# Patient Record
Sex: Female | Born: 1945 | Race: Black or African American | Hispanic: No | Smoking: Never smoker
Health system: Southern US, Community
[De-identification: ages and names within clinical notes are randomized; demographics above are authoritative.]

## PROBLEM LIST (undated history)

## (undated) DIAGNOSIS — N840 Polyp of corpus uteri: Secondary | ICD-10-CM

## (undated) DIAGNOSIS — Z860100 Personal history of colon polyps, unspecified: Secondary | ICD-10-CM

## (undated) DIAGNOSIS — R519 Headache, unspecified: Secondary | ICD-10-CM

## (undated) DIAGNOSIS — M1990 Unspecified osteoarthritis, unspecified site: Secondary | ICD-10-CM

## (undated) DIAGNOSIS — I10 Essential (primary) hypertension: Secondary | ICD-10-CM

## (undated) DIAGNOSIS — H269 Unspecified cataract: Secondary | ICD-10-CM

## (undated) DIAGNOSIS — R51 Headache: Secondary | ICD-10-CM

## (undated) DIAGNOSIS — K219 Gastro-esophageal reflux disease without esophagitis: Secondary | ICD-10-CM

## (undated) DIAGNOSIS — K5790 Diverticulosis of intestine, part unspecified, without perforation or abscess without bleeding: Secondary | ICD-10-CM

## (undated) DIAGNOSIS — K449 Diaphragmatic hernia without obstruction or gangrene: Secondary | ICD-10-CM

## (undated) DIAGNOSIS — E0590 Thyrotoxicosis, unspecified without thyrotoxic crisis or storm: Secondary | ICD-10-CM

## (undated) DIAGNOSIS — D649 Anemia, unspecified: Secondary | ICD-10-CM

## (undated) DIAGNOSIS — H409 Unspecified glaucoma: Secondary | ICD-10-CM

## (undated) DIAGNOSIS — I2510 Atherosclerotic heart disease of native coronary artery without angina pectoris: Secondary | ICD-10-CM

## (undated) HISTORY — DX: Headache, unspecified: R51.9

## (undated) HISTORY — DX: Anemia, unspecified: D64.9

## (undated) HISTORY — DX: Unspecified glaucoma: H40.9

## (undated) HISTORY — DX: Polyp of corpus uteri: N84.0

## (undated) HISTORY — DX: Headache: R51

## (undated) HISTORY — DX: Gastro-esophageal reflux disease without esophagitis: K21.9

## (undated) HISTORY — DX: Thyrotoxicosis, unspecified without thyrotoxic crisis or storm: E05.90

## (undated) HISTORY — PX: UPPER GASTROINTESTINAL ENDOSCOPY: SHX188

## (undated) HISTORY — DX: Diverticulosis of intestine, part unspecified, without perforation or abscess without bleeding: K57.90

## (undated) HISTORY — DX: Unspecified cataract: H26.9

## (undated) HISTORY — PX: COLONOSCOPY: SHX174

## (undated) HISTORY — DX: Unspecified osteoarthritis, unspecified site: M19.90

## (undated) HISTORY — DX: Personal history of colon polyps, unspecified: Z86.0100

## (undated) HISTORY — DX: Diaphragmatic hernia without obstruction or gangrene: K44.9

## (undated) HISTORY — DX: Personal history of colonic polyps: Z86.010

## (undated) HISTORY — DX: Atherosclerotic heart disease of native coronary artery without angina pectoris: I25.10

---

## 1997-08-03 ENCOUNTER — Encounter: Payer: Self-pay | Admitting: Internal Medicine

## 1998-06-12 ENCOUNTER — Other Ambulatory Visit: Admission: RE | Admit: 1998-06-12 | Discharge: 1998-06-12 | Payer: Self-pay | Admitting: Obstetrics and Gynecology

## 1998-10-25 ENCOUNTER — Emergency Department (HOSPITAL_COMMUNITY): Admission: EM | Admit: 1998-10-25 | Discharge: 1998-10-25 | Payer: Self-pay | Admitting: Emergency Medicine

## 1998-10-31 ENCOUNTER — Encounter: Payer: Self-pay | Admitting: Internal Medicine

## 1999-11-16 ENCOUNTER — Other Ambulatory Visit: Admission: RE | Admit: 1999-11-16 | Discharge: 1999-11-16 | Payer: Self-pay | Admitting: Obstetrics and Gynecology

## 1999-11-23 ENCOUNTER — Ambulatory Visit (HOSPITAL_COMMUNITY): Admission: RE | Admit: 1999-11-23 | Discharge: 1999-11-23 | Payer: Self-pay | Admitting: Obstetrics and Gynecology

## 2001-03-12 ENCOUNTER — Inpatient Hospital Stay (HOSPITAL_COMMUNITY): Admission: EM | Admit: 2001-03-12 | Discharge: 2001-03-17 | Payer: Self-pay | Admitting: Emergency Medicine

## 2001-03-12 ENCOUNTER — Encounter: Payer: Self-pay | Admitting: Emergency Medicine

## 2001-12-16 ENCOUNTER — Encounter: Payer: Self-pay | Admitting: Internal Medicine

## 2001-12-16 ENCOUNTER — Ambulatory Visit (HOSPITAL_COMMUNITY): Admission: RE | Admit: 2001-12-16 | Discharge: 2001-12-16 | Payer: Self-pay | Admitting: Internal Medicine

## 2002-03-03 ENCOUNTER — Other Ambulatory Visit: Admission: RE | Admit: 2002-03-03 | Discharge: 2002-03-03 | Payer: Self-pay | Admitting: Obstetrics and Gynecology

## 2003-05-24 ENCOUNTER — Other Ambulatory Visit: Admission: RE | Admit: 2003-05-24 | Discharge: 2003-05-24 | Payer: Self-pay | Admitting: Obstetrics and Gynecology

## 2003-12-09 ENCOUNTER — Emergency Department (HOSPITAL_COMMUNITY): Admission: EM | Admit: 2003-12-09 | Discharge: 2003-12-09 | Payer: Self-pay | Admitting: Emergency Medicine

## 2003-12-21 ENCOUNTER — Encounter: Admission: RE | Admit: 2003-12-21 | Discharge: 2003-12-21 | Payer: Self-pay | Admitting: Internal Medicine

## 2005-01-21 ENCOUNTER — Ambulatory Visit: Payer: Self-pay | Admitting: Internal Medicine

## 2005-01-30 ENCOUNTER — Ambulatory Visit: Payer: Self-pay | Admitting: Internal Medicine

## 2005-08-12 ENCOUNTER — Ambulatory Visit: Payer: Self-pay | Admitting: Internal Medicine

## 2005-12-11 ENCOUNTER — Other Ambulatory Visit: Admission: RE | Admit: 2005-12-11 | Discharge: 2005-12-11 | Payer: Self-pay | Admitting: Obstetrics and Gynecology

## 2006-06-03 ENCOUNTER — Ambulatory Visit: Payer: Self-pay | Admitting: Internal Medicine

## 2006-06-17 ENCOUNTER — Ambulatory Visit: Payer: Self-pay | Admitting: Internal Medicine

## 2006-06-17 ENCOUNTER — Ambulatory Visit (HOSPITAL_COMMUNITY): Admission: RE | Admit: 2006-06-17 | Discharge: 2006-06-17 | Payer: Self-pay | Admitting: Internal Medicine

## 2006-07-31 ENCOUNTER — Ambulatory Visit: Payer: Self-pay | Admitting: Internal Medicine

## 2006-12-03 ENCOUNTER — Ambulatory Visit: Payer: Self-pay | Admitting: Internal Medicine

## 2006-12-03 LAB — CONVERTED CEMR LAB
Bilirubin Urine: NEGATIVE
CO2: 33 meq/L — ABNORMAL HIGH (ref 19–32)
Creatinine, Ser: 0.7 mg/dL (ref 0.4–1.2)
Potassium: 3.7 meq/L (ref 3.5–5.1)
Sodium: 142 meq/L (ref 135–145)
Urine Glucose: NEGATIVE mg/dL

## 2007-03-27 ENCOUNTER — Ambulatory Visit: Payer: Self-pay | Admitting: Internal Medicine

## 2007-03-27 LAB — CONVERTED CEMR LAB
AST: 24 units/L (ref 0–37)
Albumin: 3.7 g/dL (ref 3.5–5.2)
Amylase: 112 units/L (ref 27–131)
Basophils Absolute: 0 10*3/uL (ref 0.0–0.1)
Bilirubin, Direct: 0.1 mg/dL (ref 0.0–0.3)
Creatinine, Ser: 0.7 mg/dL (ref 0.4–1.2)
Eosinophils Relative: 4.7 % (ref 0.0–5.0)
Glucose, Bld: 99 mg/dL (ref 70–99)
H Pylori IgG: POSITIVE — AB
HCT: 39.5 % (ref 36.0–46.0)
Hemoglobin, Urine: NEGATIVE
Hemoglobin: 13.3 g/dL (ref 12.0–15.0)
Leukocytes, UA: NEGATIVE
MCHC: 33.7 g/dL (ref 30.0–36.0)
MCV: 97.2 fL (ref 78.0–100.0)
Monocytes Absolute: 0.3 10*3/uL (ref 0.2–0.7)
Neutrophils Relative %: 40.8 % — ABNORMAL LOW (ref 43.0–77.0)
Potassium: 3.7 meq/L (ref 3.5–5.1)
RDW: 11.6 % (ref 11.5–14.6)
Sodium: 141 meq/L (ref 135–145)
Total Bilirubin: 0.7 mg/dL (ref 0.3–1.2)
Total Protein: 7.5 g/dL (ref 6.0–8.3)
Urobilinogen, UA: 0.2 (ref 0.0–1.0)
WBC: 3.2 10*3/uL — ABNORMAL LOW (ref 4.5–10.5)
pH: 8 (ref 5.0–8.0)

## 2007-04-09 ENCOUNTER — Ambulatory Visit: Payer: Self-pay | Admitting: Gastroenterology

## 2007-04-09 ENCOUNTER — Ambulatory Visit: Payer: Self-pay | Admitting: Internal Medicine

## 2007-06-16 ENCOUNTER — Ambulatory Visit: Payer: Self-pay | Admitting: Internal Medicine

## 2007-06-29 ENCOUNTER — Ambulatory Visit: Payer: Self-pay

## 2007-09-01 ENCOUNTER — Telehealth: Payer: Self-pay | Admitting: Internal Medicine

## 2007-10-08 ENCOUNTER — Encounter: Payer: Self-pay | Admitting: Internal Medicine

## 2007-11-17 ENCOUNTER — Telehealth: Payer: Self-pay | Admitting: Internal Medicine

## 2007-11-18 ENCOUNTER — Ambulatory Visit: Payer: Self-pay | Admitting: Internal Medicine

## 2007-11-18 DIAGNOSIS — R109 Unspecified abdominal pain: Secondary | ICD-10-CM | POA: Insufficient documentation

## 2007-11-18 DIAGNOSIS — M1990 Unspecified osteoarthritis, unspecified site: Secondary | ICD-10-CM | POA: Insufficient documentation

## 2007-11-18 DIAGNOSIS — R209 Unspecified disturbances of skin sensation: Secondary | ICD-10-CM

## 2007-11-18 DIAGNOSIS — K5730 Diverticulosis of large intestine without perforation or abscess without bleeding: Secondary | ICD-10-CM | POA: Insufficient documentation

## 2007-11-18 DIAGNOSIS — I10 Essential (primary) hypertension: Secondary | ICD-10-CM | POA: Insufficient documentation

## 2007-11-18 DIAGNOSIS — K219 Gastro-esophageal reflux disease without esophagitis: Secondary | ICD-10-CM

## 2007-11-18 DIAGNOSIS — I2510 Atherosclerotic heart disease of native coronary artery without angina pectoris: Secondary | ICD-10-CM

## 2007-11-19 ENCOUNTER — Ambulatory Visit: Payer: Self-pay | Admitting: Internal Medicine

## 2007-11-20 ENCOUNTER — Ambulatory Visit: Payer: Self-pay | Admitting: Cardiology

## 2007-11-21 ENCOUNTER — Encounter: Payer: Self-pay | Admitting: Internal Medicine

## 2007-11-21 DIAGNOSIS — E559 Vitamin D deficiency, unspecified: Secondary | ICD-10-CM

## 2007-11-21 DIAGNOSIS — R930 Abnormal findings on diagnostic imaging of skull and head, not elsewhere classified: Secondary | ICD-10-CM

## 2007-11-24 LAB — CONVERTED CEMR LAB
AST: 23 units/L (ref 0–37)
Albumin: 3.9 g/dL (ref 3.5–5.2)
Alkaline Phosphatase: 43 units/L (ref 39–117)
BUN: 10 mg/dL (ref 6–23)
Basophils Absolute: 0 10*3/uL (ref 0.0–0.1)
Basophils Relative: 0.4 % (ref 0.0–1.0)
CO2: 32 meq/L (ref 19–32)
Chloride: 103 meq/L (ref 96–112)
Creatinine, Ser: 0.7 mg/dL (ref 0.4–1.2)
H Pylori IgG: NEGATIVE
HCT: 40.6 % (ref 36.0–46.0)
HDL: 46.8 mg/dL (ref >39.0)
Hemoglobin: 13.1 g/dL (ref 12.0–15.0)
Ketones, ur: NEGATIVE mg/dL
LDL Cholesterol: 105 mg/dL — ABNORMAL HIGH (ref 0–99)
Leukocytes, UA: NEGATIVE
MCHC: 32.3 g/dL (ref 30.0–36.0)
Monocytes Absolute: 0.3 10*3/uL (ref 0.2–0.7)
Neutrophils Relative %: 41.5 % — ABNORMAL LOW (ref 43.0–77.0)
Nitrite: NEGATIVE
Potassium: 3.6 meq/L (ref 3.5–5.1)
RBC: 4.05 M/uL (ref 3.87–5.11)
RDW: 12 % (ref 11.5–14.6)
Sodium: 141 meq/L (ref 135–145)
Specific Gravity, Urine: 1.01 (ref 1.000–1.03)
Total Bilirubin: 0.8 mg/dL (ref 0.3–1.2)
Total Protein, Urine: NEGATIVE mg/dL
Total Protein: 7.4 g/dL (ref 6.0–8.3)
Urobilinogen, UA: 0.2 (ref 0.0–1.0)
VLDL: 16 mg/dL (ref 0–40)
pH: 7 (ref 5.0–8.0)

## 2007-12-04 LAB — CONVERTED CEMR LAB
Anti Nuclear Antibody(ANA): POSITIVE — AB
Rhuematoid fact SerPl-aCnc: <20 intl units/mL (ref 0–20)
Vit D, 1,25-Dihydroxy: 26 — ABNORMAL LOW (ref 30–89)

## 2007-12-10 ENCOUNTER — Encounter: Payer: Self-pay | Admitting: Internal Medicine

## 2007-12-24 ENCOUNTER — Ambulatory Visit: Payer: Self-pay | Admitting: Internal Medicine

## 2007-12-29 ENCOUNTER — Ambulatory Visit: Payer: Self-pay | Admitting: Cardiovascular Disease

## 2008-01-01 ENCOUNTER — Encounter (INDEPENDENT_AMBULATORY_CARE_PROVIDER_SITE_OTHER): Payer: Self-pay | Admitting: *Deleted

## 2008-01-18 ENCOUNTER — Ambulatory Visit: Payer: Self-pay | Admitting: Internal Medicine

## 2008-01-18 DIAGNOSIS — M25569 Pain in unspecified knee: Secondary | ICD-10-CM | POA: Insufficient documentation

## 2008-01-21 ENCOUNTER — Encounter: Payer: Self-pay | Admitting: Internal Medicine

## 2008-03-21 ENCOUNTER — Telehealth: Payer: Self-pay | Admitting: Internal Medicine

## 2008-03-25 ENCOUNTER — Ambulatory Visit: Payer: Self-pay | Admitting: Endocrinology

## 2008-03-25 DIAGNOSIS — M5412 Radiculopathy, cervical region: Secondary | ICD-10-CM | POA: Insufficient documentation

## 2008-09-06 ENCOUNTER — Ambulatory Visit (HOSPITAL_COMMUNITY): Admission: RE | Admit: 2008-09-06 | Discharge: 2008-09-06 | Payer: Self-pay | Admitting: Obstetrics and Gynecology

## 2008-09-06 ENCOUNTER — Ambulatory Visit: Payer: Self-pay | Admitting: Vascular Surgery

## 2008-09-06 ENCOUNTER — Encounter (INDEPENDENT_AMBULATORY_CARE_PROVIDER_SITE_OTHER): Payer: Self-pay | Admitting: Obstetrics and Gynecology

## 2008-11-08 ENCOUNTER — Ambulatory Visit: Payer: Self-pay | Admitting: Internal Medicine

## 2008-11-08 DIAGNOSIS — R0789 Other chest pain: Secondary | ICD-10-CM

## 2008-11-10 ENCOUNTER — Encounter: Payer: Self-pay | Admitting: Internal Medicine

## 2008-11-10 LAB — CONVERTED CEMR LAB
AST: 20 units/L (ref 0–37)
Albumin: 3.9 g/dL (ref 3.5–5.2)
Alkaline Phosphatase: 47 units/L (ref 39–117)
BUN: 8 mg/dL (ref 6–23)
Basophils Relative: 1.4 % (ref 0.0–3.0)
Cholesterol: 193 mg/dL (ref 0–200)
Creatinine, Ser: 0.6 mg/dL (ref 0.4–1.2)
Eosinophils Relative: 3.6 % (ref 0.0–5.0)
GFR calc Af Amer: 130 mL/min
Glucose, Bld: 104 mg/dL — ABNORMAL HIGH (ref 70–99)
HCT: 39.4 % (ref 36.0–46.0)
Hemoglobin: 13.7 g/dL (ref 12.0–15.0)
MCV: 98.8 fL (ref 78.0–100.0)
Monocytes Absolute: 0.2 10*3/uL (ref 0.1–1.0)
Monocytes Relative: 5.7 % (ref 3.0–12.0)
Nitrite: NEGATIVE
Platelets: 210 10*3/uL (ref 150–400)
Potassium: 3 meq/L — ABNORMAL LOW (ref 3.5–5.1)
RBC: 3.98 M/uL (ref 3.87–5.11)
Specific Gravity, Urine: 1.015 (ref 1.000–1.03)
Total Protein, Urine: NEGATIVE mg/dL
Total Protein: 7.6 g/dL (ref 6.0–8.3)
Urine Glucose: NEGATIVE mg/dL
Vit D, 1,25-Dihydroxy: 27 — ABNORMAL LOW (ref 30–89)
Vitamin B-12: 863 pg/mL (ref 211–911)
WBC: 3 10*3/uL — ABNORMAL LOW (ref 4.5–10.5)
pH: 5.5 (ref 5.0–8.0)

## 2008-11-22 ENCOUNTER — Telehealth: Payer: Self-pay | Admitting: Internal Medicine

## 2008-12-13 ENCOUNTER — Ambulatory Visit: Payer: Self-pay | Admitting: Internal Medicine

## 2008-12-13 LAB — CONVERTED CEMR LAB
CO2: 31 meq/L — ABNORMAL HIGH (ref 10–19)
Calcium: 9 mg/dL (ref 8.4–10.5)
Creatinine, Ser: 0.6 mg/dL (ref 0.4–1.2)
Glucose, Bld: 106 mg/dL — ABNORMAL HIGH (ref 70–99)

## 2008-12-15 ENCOUNTER — Ambulatory Visit: Payer: Self-pay | Admitting: Internal Medicine

## 2008-12-15 DIAGNOSIS — J309 Allergic rhinitis, unspecified: Secondary | ICD-10-CM | POA: Insufficient documentation

## 2009-03-02 ENCOUNTER — Ambulatory Visit: Payer: Self-pay | Admitting: Internal Medicine

## 2009-03-02 DIAGNOSIS — J018 Other acute sinusitis: Secondary | ICD-10-CM

## 2009-06-14 ENCOUNTER — Ambulatory Visit: Payer: Self-pay | Admitting: Internal Medicine

## 2009-06-14 LAB — CONVERTED CEMR LAB
BUN: 9 mg/dL (ref 6–23)
Creatinine, Ser: 0.7 mg/dL (ref 0.4–1.2)
GFR calc non Af Amer: 108.68 mL/min (ref >60)

## 2009-06-20 ENCOUNTER — Ambulatory Visit: Payer: Self-pay | Admitting: Internal Medicine

## 2009-06-20 DIAGNOSIS — B353 Tinea pedis: Secondary | ICD-10-CM | POA: Insufficient documentation

## 2009-07-13 ENCOUNTER — Ambulatory Visit: Payer: Self-pay | Admitting: Internal Medicine

## 2009-07-24 ENCOUNTER — Telehealth: Payer: Self-pay | Admitting: Internal Medicine

## 2009-07-27 ENCOUNTER — Encounter: Payer: Self-pay | Admitting: Internal Medicine

## 2009-07-27 ENCOUNTER — Ambulatory Visit: Payer: Self-pay | Admitting: Internal Medicine

## 2009-07-29 ENCOUNTER — Encounter: Payer: Self-pay | Admitting: Internal Medicine

## 2009-10-04 ENCOUNTER — Ambulatory Visit: Payer: Self-pay | Admitting: Internal Medicine

## 2009-10-04 DIAGNOSIS — M79609 Pain in unspecified limb: Secondary | ICD-10-CM

## 2009-10-04 DIAGNOSIS — R51 Headache: Secondary | ICD-10-CM

## 2009-10-04 LAB — CONVERTED CEMR LAB
Calcium: 9.9 mg/dL (ref 8.4–10.5)
Creatinine, Ser: 0.6 mg/dL (ref 0.4–1.2)
GFR calc non Af Amer: 129.71 mL/min (ref >60)
Sed Rate: 12 mm/hr (ref 0–22)
TSH: 1.07 microintl units/mL (ref 0.35–5.50)
Vitamin B-12: 710 pg/mL (ref 211–911)

## 2009-11-03 ENCOUNTER — Ambulatory Visit: Payer: Self-pay | Admitting: Internal Medicine

## 2009-11-03 ENCOUNTER — Telehealth: Payer: Self-pay | Admitting: Internal Medicine

## 2009-11-03 DIAGNOSIS — R509 Fever, unspecified: Secondary | ICD-10-CM | POA: Insufficient documentation

## 2009-11-03 DIAGNOSIS — R1013 Epigastric pain: Secondary | ICD-10-CM | POA: Insufficient documentation

## 2009-11-05 LAB — CONVERTED CEMR LAB
ALT: 21 units/L (ref 0–35)
BUN: 6 mg/dL (ref 6–23)
Bilirubin Urine: NEGATIVE
Bilirubin, Direct: 0.1 mg/dL (ref 0.0–0.3)
CK-MB: 2.8 ng/mL (ref 0.3–4.0)
Creatinine, Ser: 0.6 mg/dL (ref 0.4–1.2)
Eosinophils Relative: 3.2 % (ref 0.0–5.0)
GFR calc non Af Amer: 129.68 mL/min (ref >60)
MCV: 99.6 fL (ref 78.0–100.0)
Monocytes Absolute: 0.3 10*3/uL (ref 0.1–1.0)
Monocytes Relative: 9.5 % (ref 3.0–12.0)
Neutrophils Relative %: 52.9 % (ref 43.0–77.0)
Nitrite: NEGATIVE
Platelets: 241 10*3/uL (ref 150.0–400.0)
Relative Index: 1.3 (ref 0.0–2.5)
Sed Rate: 23 mm/hr — ABNORMAL HIGH (ref 0–22)
Specific Gravity, Urine: 1.015 (ref 1.000–1.030)
Total Bilirubin: 0.7 mg/dL (ref 0.3–1.2)
Total CK: 217 units/L — ABNORMAL HIGH (ref 7–177)
Total Protein, Urine: NEGATIVE mg/dL
WBC: 3.1 10*3/uL — ABNORMAL LOW (ref 4.5–10.5)
pH: 7 (ref 5.0–8.0)

## 2009-11-07 ENCOUNTER — Telehealth: Payer: Self-pay | Admitting: Internal Medicine

## 2009-11-09 ENCOUNTER — Ambulatory Visit: Payer: Self-pay | Admitting: Internal Medicine

## 2009-11-09 DIAGNOSIS — B029 Zoster without complications: Secondary | ICD-10-CM | POA: Insufficient documentation

## 2009-11-29 ENCOUNTER — Emergency Department (HOSPITAL_BASED_OUTPATIENT_CLINIC_OR_DEPARTMENT_OTHER): Admission: EM | Admit: 2009-11-29 | Discharge: 2009-11-29 | Payer: Self-pay | Admitting: Emergency Medicine

## 2009-11-29 ENCOUNTER — Ambulatory Visit: Payer: Self-pay | Admitting: Diagnostic Radiology

## 2010-03-22 ENCOUNTER — Telehealth: Payer: Self-pay | Admitting: Internal Medicine

## 2010-03-23 DIAGNOSIS — K649 Unspecified hemorrhoids: Secondary | ICD-10-CM | POA: Insufficient documentation

## 2010-03-23 DIAGNOSIS — Z860100 Personal history of colon polyps, unspecified: Secondary | ICD-10-CM | POA: Insufficient documentation

## 2010-03-23 DIAGNOSIS — D500 Iron deficiency anemia secondary to blood loss (chronic): Secondary | ICD-10-CM

## 2010-03-26 ENCOUNTER — Ambulatory Visit: Payer: Self-pay | Admitting: Internal Medicine

## 2010-03-26 DIAGNOSIS — E0590 Thyrotoxicosis, unspecified without thyrotoxic crisis or storm: Secondary | ICD-10-CM

## 2010-03-26 DIAGNOSIS — K625 Hemorrhage of anus and rectum: Secondary | ICD-10-CM

## 2010-03-26 DIAGNOSIS — K648 Other hemorrhoids: Secondary | ICD-10-CM | POA: Insufficient documentation

## 2010-09-07 ENCOUNTER — Telehealth (INDEPENDENT_AMBULATORY_CARE_PROVIDER_SITE_OTHER): Payer: Self-pay | Admitting: *Deleted

## 2010-09-12 ENCOUNTER — Telehealth (INDEPENDENT_AMBULATORY_CARE_PROVIDER_SITE_OTHER): Payer: Self-pay | Admitting: *Deleted

## 2010-09-14 ENCOUNTER — Ambulatory Visit: Payer: Self-pay | Admitting: Internal Medicine

## 2010-09-17 LAB — CONVERTED CEMR LAB
ALT: 26 U/L
AST: 30 U/L
Albumin: 3.7 g/dL
Alkaline Phosphatase: 65 U/L
BUN: 12 mg/dL
Basophils Absolute: 0 K/uL
Basophils Relative: 0.6 %
Bilirubin Urine: NEGATIVE
Bilirubin, Direct: 0.1 mg/dL
Blood, UA: NEGATIVE
CO2: 29 meq/L
Calcium: 8.9 mg/dL
Chloride: 104 meq/L
Cholesterol: 161 mg/dL
Creatinine, Ser: 0.7 mg/dL
Eosinophils Absolute: 0.1 K/uL
Eosinophils Relative: 3.4 %
GFR calc non Af Amer: 113.86 mL/min
Glucose, Bld: 79 mg/dL
HCT: 37.2 %
HDL: 48 mg/dL
Hemoglobin: 12.7 g/dL
Ketones, ur: NEGATIVE mg/dL
LDL Cholesterol: 102 mg/dL — ABNORMAL HIGH
Leukocytes, UA: NEGATIVE
Lymphocytes Relative: 38.8 %
Lymphs Abs: 1.2 K/uL
MCHC: 34.3 g/dL
MCV: 98.7 fL
Monocytes Absolute: 0.3 K/uL
Monocytes Relative: 10 %
Neutro Abs: 1.4 K/uL
Neutrophils Relative %: 47.2 %
Nitrite: NEGATIVE
Platelets: 223 K/uL
Potassium: 3.6 meq/L
RBC: 3.77 M/uL — ABNORMAL LOW
RDW: 12.9 %
Sodium: 139 meq/L
Specific Gravity, Urine: 1.015
TSH: 0.51 u[IU]/mL
Total Bilirubin: 1 mg/dL
Total CHOL/HDL Ratio: 3
Total Protein, Urine: NEGATIVE mg/dL
Total Protein: 6.9 g/dL
Triglycerides: 56 mg/dL
Urine Glucose: NEGATIVE mg/dL
Urobilinogen, UA: 0.2
VLDL: 11.2 mg/dL
WBC: 3.1 10*3/microliter — ABNORMAL LOW
pH: 7

## 2010-09-20 ENCOUNTER — Encounter: Payer: Self-pay | Admitting: Internal Medicine

## 2010-09-21 ENCOUNTER — Ambulatory Visit: Payer: Self-pay | Admitting: Family Medicine

## 2010-09-21 ENCOUNTER — Encounter: Payer: Self-pay | Admitting: Internal Medicine

## 2010-09-21 ENCOUNTER — Ambulatory Visit: Payer: Self-pay | Admitting: Internal Medicine

## 2010-10-19 ENCOUNTER — Encounter: Payer: Self-pay | Admitting: Internal Medicine

## 2010-10-30 NOTE — Progress Notes (Signed)
  Phone Note Other Incoming   Caller: p t Summary of Call: pt calling for refill on her Amlodipine. She states she has ran out because some days when her BP is up she takes this medication twice a day instead of once a day. What do you advise and can I increase the qty on prescription. Please Advise Initial call taken by: Ami Bullins CMA,  September 07, 2010 1:40 PM  Follow-up for Phone Call        ok to increase ok to ref x 6 Follow-up by: Tresa Garter MD,  September 07, 2010 1:52 PM    New/Updated Medications: NORVASC 5 MG  TABS (AMLODIPINE BESYLATE) 1 by mouth two times a day if needed Prescriptions: NORVASC 5 MG  TABS (AMLODIPINE BESYLATE) 1 by mouth two times a day if needed  #60 x 6   Entered by:   Ami Bullins CMA   Authorized by:   Tresa Garter MD   Signed by:   Bill Salinas CMA on 09/07/2010   Method used:   Electronically to        [id number]* (retail)       [address] [place], [place]  [address]       Ph: [phone]       Fax: [phone]   RxID:   [id number]

## 2010-10-30 NOTE — Progress Notes (Signed)
Summary: triage   Phone Note Call from Patient Call back at Home Phone [phone]   Caller: Patient Call For: Dr. Marina Goodell Reason for Call: Talk to Nurse Summary of Call: not willing to wait for next available the firstof August... would like to be worked in... ok to see Amy or Gunnar Fusi... blood in stool, hx internal hemorrhoids per pt Initial call taken by: Vallarie Mare,  March 22, 2010 9:05 AM  Follow-up for Phone Call        seeing some brb with stools intermittenly x 5 days.No pain. has hx. of hemorrhoids.Given appt. with PA for Monday 03/26/10. Follow-up by: Teryl Lucy RN,  March 22, 2010 9:19 AM

## 2010-10-30 NOTE — Assessment & Plan Note (Signed)
Summary: rash// ok to schedule per MD/la   Vital Signs:  Patient profile:   65 year old female Weight:      170 pounds Temp:     98.5 degrees F oral Pulse rate:   86 / minute BP sitting:   132 / 86  (left arm)  Vitals Entered By: Tora Perches (November 09, 2009 11:13 AM) CC: rash Is Patient Diabetic? No   Primary Care Provider:  Tresa Garter MD  CC:  rash.  History of Present Illness: C/o rash on L buttock. Overall pain is much better  Preventive Screening-Counseling & Management  Alcohol-Tobacco     Smoking Status: never  Current Medications (verified): 1)  Lasix 40 Mg Tabs (Furosemide) .Marland Kitchen.. 1 By Mouth Once Daily As Needed Swelling 2)  Norvasc 5 Mg  Tabs (Amlodipine Besylate) .Marland Kitchen.. 1 By Mouth Qd 3)  Aciphex 20 Mg Tbec (Rabeprazole Sodium) .... Take 1 Tab Every Morning 4)  Aspirin 81 Mg  Tbec (Aspirin) .... One By Mouth Every Day 5)  Ibuprofen 600 Mg  Tabs (Ibuprofen) .Marland Kitchen.. 1 By Mouth Two Times A Day Pc As Needed Pain 6)  Klor-Con M20 20 Meq  Tbcr (Potassium Chloride Crys Cr) .Marland Kitchen.. 1 By Mouth Bid 7)  Amitriptyline Hcl 25 Mg  Tabs (Amitriptyline Hcl) .... Once Daily 8)  Vitamin D3 1000 Unit  Tabs (Cholecalciferol) .Marland Kitchen.. 1 Qd 9)  Loratadine 10 Mg  Tabs (Loratadine) .... Once Daily As Needed Allergies 10)  Fluticasone Propionate 50 Mcg/act  Susp (Fluticasone Propionate) .... 2 Sprays Each Nostril Once Daily 11)  Ketoconazole 2 % Crea (Ketoconazole) .... Use Once Daily X 1 Month 12)  Tramadol Hcl 50 Mg Tabs (Tramadol Hcl) .Marland Kitchen.. 1-2 Tabs By Mouth Two Times A Day As Needed Pain 13)  Benazepril Hcl 40 Mg Tabs (Benazepril Hcl) .Marland Kitchen.. 1 By Mouth Once Daily For Blood Pressure  Allergies (verified): No Known Drug Allergies   Impression & Recommendations:  Problem # 1:  HERPES ZOSTER (ICD-053.9) L buttock Assessment New Acyclovir x 7 d  Problem # 2:  Previouspains on L likely were related to #1 Assessment: Improved  Complete Medication List: 1)  Lasix 40 Mg Tabs  (Furosemide) .Marland Kitchen.. 1 by mouth once daily as needed swelling 2)  Norvasc 5 Mg Tabs (Amlodipine besylate) .Marland Kitchen.. 1 by mouth qd 3)  Aciphex 20 Mg Tbec (Rabeprazole sodium) .... Take 1 tab every morning 4)  Aspirin 81 Mg Tbec (Aspirin) .... One by mouth every day 5)  Ibuprofen 600 Mg Tabs (Ibuprofen) .Marland Kitchen.. 1 by mouth two times a day pc as needed pain 6)  Klor-con M20 20 Meq Tbcr (Potassium chloride crys cr) .Marland Kitchen.. 1 by mouth bid 7)  Amitriptyline Hcl 25 Mg Tabs (Amitriptyline hcl) .... Once daily 8)  Vitamin D3 1000 Unit Tabs (Cholecalciferol) .Marland Kitchen.. 1 qd 9)  Loratadine 10 Mg Tabs (Loratadine) .... Once daily as needed allergies 10)  Fluticasone Propionate 50 Mcg/act Susp (Fluticasone propionate) .... 2 sprays each nostril once daily 11)  Ketoconazole 2 % Crea (Ketoconazole) .... Use once daily x 1 month 12)  Tramadol Hcl 50 Mg Tabs (Tramadol hcl) .Marland Kitchen.. 1-2 tabs by mouth two times a day as needed pain 13)  Benazepril Hcl 40 Mg Tabs (Benazepril hcl) .Marland Kitchen.. 1 by mouth once daily for blood pressure 14)  Triamcinolone Acetonide 0.5 % Crea (Triamcinolone acetonide) .... Use two times a day prn 15)  Acyclovir 800 Mg Tabs (Acyclovir) .Marland Kitchen.. 1 by mouth 5 times a day  Patient Instructions:  1)  Call if you are not better in a reasonable amount of time or if worse.  Prescriptions: ACYCLOVIR 800 MG TABS (ACYCLOVIR) 1 by mouth 5 times a day  #35 x 1   Entered and Authorized by:   Tresa Garter MD   Signed by:   Tresa Garter MD on 11/09/2009   Method used:   Electronically to        [id number]* (retail)       [address] [place], [place]  [address]       Ph: [phone]       Fax: [phone]   RxID:   [id number] TRIAMCINOLONE ACETONIDE 0.5 % CREA (TRIAMCINOLONE ACETONIDE) use two times a day prn  #45g x 1   Entered and Authorized by:   Tresa Garter MD   Signed by:   Tresa Garter MD on 11/09/2009   Method used:   Electronically to        [facility] #[id number]* (retail)       [address] [place], [place]  [address]       Ph: [phone]       Fax: [phone]   RxID:   [id number]

## 2010-10-30 NOTE — Assessment & Plan Note (Signed)
Summary: swollen feet,  pain,req ov 1/5/#/cd   Vital Signs:  Patient profile:   65 year old female Weight:      171 pounds BMI:     29.46 Temp:     98.2 degrees F oral Pulse rate:   78 / minute BP sitting:   144 / 100  (left arm)  Vitals Entered By: Tora Perches (October 04, 2009 9:35 AM) CC: swollen feet  and pain Is Patient Diabetic? No   Primary Care Provider:  Tresa Garter MD  CC:  swollen feet  and pain.  History of Present Illness: C/o L HA x 1 HA C/o L foot swelling and pain x 1 mo  Preventive Screening-Counseling & Management  Alcohol-Tobacco     Smoking Status: never  Current Medications (verified): 1)  Lasix 40 Mg Tabs (Furosemide) .Marland Kitchen.. 1 By Mouth Once Daily As Needed Swelling 2)  Norvasc 5 Mg  Tabs (Amlodipine Besylate) .Marland Kitchen.. 1 By Mouth Qd 3)  Aciphex 20 Mg Tbec (Rabeprazole Sodium) .... Take 1 Tab Every Morning 4)  Aspirin 81 Mg  Tbec (Aspirin) .... One By Mouth Every Day 5)  Ibuprofen 600 Mg  Tabs (Ibuprofen) .Marland Kitchen.. 1 By Mouth Two Times A Day Pc As Needed Pain 6)  Klor-Con M20 20 Meq  Tbcr (Potassium Chloride Crys Cr) .Marland Kitchen.. 1 By Mouth Bid 7)  Amitriptyline Hcl 25 Mg  Tabs (Amitriptyline Hcl) .... Once Daily 8)  Vitamin D3 1000 Unit  Tabs (Cholecalciferol) .Marland Kitchen.. 1 Qd 9)  Darvocet-N 100 100-650 Mg Tabs (Propoxyphene N-Apap) .Marland Kitchen.. 1 By Mouth Two Times A Day As Needed Pain 10)  Loratadine 10 Mg  Tabs (Loratadine) .... Once Daily As Needed Allergies 11)  Fluticasone Propionate 50 Mcg/act  Susp (Fluticasone Propionate) .... 2 Sprays Each Nostril Once Daily 12)  Ketoconazole 2 % Crea (Ketoconazole) .... Use Once Daily X 1 Month 13)  Benazepril-Hydrochlorothiazide 20-12.5 Mg Tabs (Benazepril-Hydrochlorothiazide) .Marland Kitchen.. 1 By Mouth Qd  Allergies (verified): No Known Drug Allergies  Past History:  Past Medical History: Last updated: 12/15/2008 Coronary artery disease, trivial  - cath 2002 Diverticulosis, colon GERD Osteoarthritis Elev. ANA Hypertension Vit  D def Uterine polyps Dr Perlie Gold 2009 Allergic rhinitis  Social History: Last updated: 11/08/2008 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 Smoked  Physical Exam  General:  alert, well-developed, well-nourished, and cooperative to examination.    Nose:  no external deformity, nasal discharge, mucosal pallor, and mucosal erythema.   Mouth:  pharyngeal erythema, fair dentition, and postnasal drip.   Lungs:  normal respiratory effort, no intercostal retractions or use of accessory muscles; normal breath sounds bilaterally - no crackles and no wheezes.    Heart:  normal rate, regular rhythm, no murmur, and no rub. BLE without edema.  Abdomen:  Bowel sounds positive,abdomen soft and non-tender without masses, organomegaly or hernias noted. Msk:  L foot doorsal and medial swelling and tenderness --not red Extremities:  No clubbing, cyanosis, edema, or deformity noted with normal full range of motion of all joints.   Neurologic:  No cranial nerve deficits noted. Station and gait are normal. Plantar reflexes are down-going bilaterally. DTRs are symmetrical throughout. Sensory, motor and coordinative functions appear intact.   Impression & Recommendations:  Problem # 1:  FOOT PAIN (ICD-729.5) L Assessment New  Orders: T-Foot Left Min 3 Views (73630TC) TLB-B12, Serum-Total ONLY [id number]) TLB-BMP (Basic Metabolic Panel-BMET) (80048-METABOL) TLB-TSH (Thyroid Stimulating Hormone) (84443-TSH) TLB-Sedimentation Rate (ESR) (85652-ESR) TLB-Uric Acid, Blood (84550-URIC)  Problem # 2:  HEADACHE (ICD-784.0) Assessment: New  The following medications were removed from the medication list:    Darvocet-n 100 100-650 Mg Tabs (Propoxyphene n-apap) .Marland Kitchen... 1 by mouth two times a day as needed pain Her updated medication list for this problem includes:    Aspirin 81 Mg Tbec (Aspirin) ..... One by mouth every day    Ibuprofen 600 Mg Tabs (Ibuprofen) .Marland Kitchen... 1 by mouth two times a day pc as needed pain     Tramadol Hcl 50 Mg Tabs (Tramadol hcl) .Marland Kitchen... 1-2 tabs by mouth two times a day as needed pain  Problem # 3:  HYPERTENSION (ICD-401.9) Assessment: Unchanged  The following medications were removed from the medication list:    Benazepril-hydrochlorothiazide 20-12.5 Mg Tabs (Benazepril-hydrochlorothiazide) .Marland Kitchen... 1 by mouth qd Her updated medication list for this problem includes:    Lasix 40 Mg Tabs (Furosemide) .Marland Kitchen... 1 by mouth once daily as needed swelling    Norvasc 5 Mg Tabs (Amlodipine besylate) .Marland Kitchen... 1 by mouth qd    Benazepril Hcl 40 Mg Tabs (Benazepril hcl) .Marland Kitchen... 1 by mouth once daily for blood pressure  Orders: TLB-B12, Serum-Total ONLY ([id number]) TLB-BMP (Basic Metabolic Panel-BMET) (80048-METABOL) TLB-TSH (Thyroid Stimulating Hormone) (84443-TSH) TLB-Sedimentation Rate (ESR) (85652-ESR) TLB-Uric Acid, Blood (84550-URIC)  BP today: 144/100 Prior BP: 142/100 (06/20/2009)  Labs Reviewed: K+: 3.4 (06/14/2009) Creat: : 0.7 (06/14/2009)   Chol: 193 (11/08/2008)   HDL: 52.8 (11/08/2008)   LDL: 128 (11/08/2008)   TG: 59 (11/08/2008)  Complete Medication List: 1)  Lasix 40 Mg Tabs (Furosemide) .Marland Kitchen.. 1 by mouth once daily as needed swelling 2)  Norvasc 5 Mg Tabs (Amlodipine besylate) .Marland Kitchen.. 1 by mouth qd 3)  Aciphex 20 Mg Tbec (Rabeprazole sodium) .... Take 1 tab every morning 4)  Aspirin 81 Mg Tbec (Aspirin) .... One by mouth every day 5)  Ibuprofen 600 Mg Tabs (Ibuprofen) .Marland Kitchen.. 1 by mouth two times a day pc as needed pain 6)  Klor-con M20 20 Meq Tbcr (Potassium chloride crys cr) .Marland Kitchen.. 1 by mouth bid 7)  Amitriptyline Hcl 25 Mg Tabs (Amitriptyline hcl) .... Once daily 8)  Vitamin D3 1000 Unit Tabs (Cholecalciferol) .Marland Kitchen.. 1 qd 9)  Loratadine 10 Mg Tabs (Loratadine) .... Once daily as needed allergies 10)  Fluticasone Propionate 50 Mcg/act Susp (Fluticasone propionate) .... 2 sprays each nostril once daily 11)  Ketoconazole 2 % Crea (Ketoconazole) .... Use once daily x 1 month 12)   Tramadol Hcl 50 Mg Tabs (Tramadol hcl) .Marland Kitchen.. 1-2 tabs by mouth two times a day as needed pain 13)  Benazepril Hcl 40 Mg Tabs (Benazepril hcl) .Marland Kitchen.. 1 by mouth once daily for blood pressure  Patient Instructions: 1)  Try to eat more raw plant food, fresh and dry fruit, raw almonds, leafy vegetables, whole foods and less red meat, less animal fat. Poultry and fish is better for you than pork and beef. Avoid processed foods (canned soups, hot dogs, sausage, bacon , frozen dinners). Avoid corn syrup, high fructose syrup or aspartam and Splenda  containing drinks. Honey, Agave and Stevia are better sweeteners. Make your own  dressing with olive oil, wine vinegar, lemon juce, garlic etc. for your salads.  2)  Please schedule a follow-up appointment in 1 month. Prescriptions: LASIX 40 MG TABS (FUROSEMIDE) 1 by mouth once daily as needed swelling  #90 x 3   Entered and Authorized by:   Tresa Garter MD   Signed by:   Tresa Garter MD on 10/04/2009   Method used:   Electronically to  [id number]* (retail)       [address] [place], [place]  [address]       Ph: [phone]       Fax: [phone]   RxID:   [id number] BENAZEPRIL HCL 40 MG TABS (BENAZEPRIL HCL) 1 by mouth once daily for blood pressure  #90 x 3   Entered and Authorized by:   Tresa Garter MD   Signed by:   Tresa Garter MD on 10/04/2009   Method used:   Electronically to        [id number]* (retail)       [address] [place], [place]  [address]       Ph: [phone]       Fax: [phone]   RxID:   [id number] TRAMADOL HCL 50 MG TABS (TRAMADOL HCL) 1-2 tabs by mouth two times a day as needed pain  #120 x 3   Entered and Authorized by:   Tresa Garter MD   Signed by:   Tresa Garter MD on 10/04/2009   Method used:   Electronically to        [id number]* (retail)       [address] [place], [place]   [address]       Ph: [phone]       Fax: [phone]   RxID:   [id number]

## 2010-10-30 NOTE — Assessment & Plan Note (Signed)
Summary: Painless brrb x5days-hx. hemorrhoids/cl    History of Present Illness Visit Type: Initial Visit Primary GI MD: Yancey Flemings MD Primary Provider: Tresa Garter MD Chief Complaint: BRB x 5 days hx of hemorrhoids History of Present Illness:   Kelly Santos 65 YO FEMALE KNOWN TO DR. PERRY. SHE HAD A COLONOSCOPY 10/10 SHOWING DIVERTICULOSIS, AND INTERNAL HEMORRHOIDS,ONE DIMUNITIVE POLYP. SHE RELATES A SMALL AMT OF INTERMITTENT RECTAL BLEEDING OVER THE PAST YEAR. NOW OVER THE PAST 5-6 DAYS HAD HAD DAILY BRB PER RECTUM,WHICH IS MORE THAN WHAT SHE HAD EVER SEEN IN THE PAST. SHE ONLY HAS BLEEDING WITH BM'S, NO RECTAL PAIN,SOME MILD PRESSURE. BM'S NORMAL   GI Review of Systems    Reports bloating.      Denies abdominal pain, acid reflux, belching, chest pain, dysphagia with liquids, dysphagia with solids, heartburn, loss of appetite, nausea, vomiting, vomiting blood, and  weight loss.      Reports diverticulosis, hemorrhoids, and  rectal bleeding.     Denies anal fissure, black tarry stools, change in bowel habit, constipation, diarrhea, fecal incontinence, heme positive stool, irritable bowel syndrome, jaundice, light color stool, liver problems, and  rectal pain.    Current Medications (verified): 1)  Lasix 40 Mg Tabs (Furosemide) .Marland Kitchen.. 1 By Mouth Once Daily As Needed Swelling 2)  Norvasc 5 Mg  Tabs (Amlodipine Besylate) .Marland Kitchen.. 1 By Mouth Qd 3)  Aciphex 20 Mg Tbec (Rabeprazole Sodium) .... Take 1 Tab Every Morning 4)  Aspirin 81 Mg  Tbec (Aspirin) .... One By Mouth Every Day 5)  Ibuprofen 600 Mg  Tabs (Ibuprofen) .Marland Kitchen.. 1 By Mouth Two Times A Day Pc As Needed Pain 6)  Klor-Con M20 20 Meq  Tbcr (Potassium Chloride Crys Cr) .Marland Kitchen.. 1 By Mouth Bid 7)  Amitriptyline Hcl 25 Mg  Tabs (Amitriptyline Hcl) .... Once Daily 8)  Vitamin D3 1000 Unit  Tabs (Cholecalciferol) .Marland Kitchen.. 1 Qd 9)  Loratadine 10 Mg  Tabs (Loratadine) .... Once Daily As Needed Allergies 10)  Fluticasone Propionate 50 Mcg/act  Susp  (Fluticasone Propionate) .... 2 Sprays Each Nostril Once Daily 11)  Ketoconazole 2 % Crea (Ketoconazole) .... Use Once Daily X 1 Month 12)  Tramadol Hcl 50 Mg Tabs (Tramadol Hcl) .Marland Kitchen.. 1-2 Tabs By Mouth Two Times A Day As Needed Pain 13)  Benazepril Hcl 40 Mg Tabs (Benazepril Hcl) .Marland Kitchen.. 1 By Mouth Once Daily For Blood Pressure 14)  Triamcinolone Acetonide 0.5 % Crea (Triamcinolone Acetonide) .... Use Two Times A Day Prn  Allergies (verified): No Known Drug Allergies  Past History:  Past Medical History: Coronary artery disease, trivial  - cath 2002 Diverticulosis, colon INT HEMORRHOIDS COLON POLYPS,HYPERPLASTIC GERD Osteoarthritis Elev. ANA Hypertension Vit D def Uterine polyps Dr Perlie Gold 2009 Allergic rhinitis  Past Surgical History: Reviewed history from 03/23/2010 and no changes required. D & C  Family History: Reviewed history from 03/23/2010 and no changes required. Family History Hypertension Family History of Heart Disease: Mother Family History of Kidney Disease:Mother  Social History: Reviewed history from 11/08/2008 and no changes required. 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 Smoked  Review of Systems  The patient denies allergy/sinus, anemia, anxiety-new, arthritis/joint pain, back pain, blood in urine, breast changes/lumps, change in vision, confusion, cough, coughing up blood, depression-new, fainting, fatigue, fever, headaches-new, hearing problems, heart murmur, heart rhythm changes, itching, menstrual pain, muscle pains/cramps, night sweats, nosebleeds, pregnancy symptoms, shortness of breath, skin rash, sleeping problems, sore throat, swelling of feet/legs, swollen lymph glands, thirst - excessive , urination - excessive , urination  changes/pain, urine leakage, vision changes, and voice change.         OTHERWISE SEE HPI  Vital Signs:  Patient profile:   66 year old female Height:      65 inches Weight:      168.38 pounds BMI:     28.12 Pulse rate:   72  / minute Pulse rhythm:   regular BP sitting:   150 / 93  (left arm)  Vitals Entered By: Chales Abrahams CMA Duncan Dull) (March 26, 2010 1:50 PM)  Physical Exam  General:  Well developed, well nourished, no acute distress. Head:  Normocephalic and atraumatic. Eyes:  PERRLA, no icterus. Lungs:  Clear throughout to auscultation. Heart:  Regular rate and rhythm; no murmurs, rubs,  or bruits. Abdomen:  SOFT, NONTENDER, NO MASS OR HSM,BS+ Rectal:  SMALL EXTERNAL HEM TAG, INTERNAL HEMORRHOIDS SOMEWHAT SWOLLEN, NON THROMBOSED,NO STOOL BUT MUCOUS HEME NEGATIVE. Extremities:  No clubbing, cyanosis, edema or deformities noted. Neurologic:  Alert and  oriented x4;  grossly normal neurologically. Psych:  Alert and cooperative. Normal mood and affect.   Impression & Recommendations:  Problem # 1:  RECTAL BLEEDING (ICD-569.3) Assessment Deteriorated 65 YO FEMALE WITH 6 DAY HX OF HEMATOCHEZIA WITH BM'S. THIS IS CONSISTENT WITH BLLEDING FROM DOCUMENTED INTERNAL HEMORRHOIDS.  START ANUSOL HC SUPP at bedtime X 10 DAYS THEN AS NEEDED. HOLD BABY ASA X ONE WEEK PT ADVISED TO CALL BACK IF BLEEDING PERSISTING OVER THE NEXT COUPLE WEEKS.  Problem # 2:  COLONIC POLYPS, HX OF (ICD-V12.72) Assessment: Comment Only COLONOSCOPY 10/10-HYPERPLASTIC-DUE FOR FOLLOW UP 2020  Problem # 3:  DIVERTICULOSIS, COLON (ICD-562.10) Assessment: Comment Only  Patient Instructions: 1)  We have given you a brochure on a high fiber diet to follow. 2)  We have sent you a prescription for Anusol HC suppositories to the pharmacy. You are to take them at bedtime x 10 days then take as needed. 3)  Please call our office back should your rectal bleeding not subside after a 2 week period. 4)  Copy sent to : Dr Sonda Primes, Dr Yancey Flemings 5)  The medication list was reviewed and reconciled.  All changed / newly prescribed medications were explained.  A complete medication list was provided to the patient /  caregiver. Prescriptions: ANUSOL-HC 25 MG SUPP (HYDROCORTISONE ACETATE) Insert 1 suppository into rectum at bedtime x 10 days then use as needed.  #12 x 1   Entered by:   Lamona Curl CMA (AAMA)   Authorized by:   Sammuel Cooper PA-c   Signed by:   Lamona Curl CMA (AAMA) on 03/26/2010   Method used:   Electronically to        [id number]* (retail)       [address] [place], [place]  [address]       Ph: [phone]       Fax: [phone]   RxID:   [id number]

## 2010-10-30 NOTE — Progress Notes (Signed)
Summary: More ?"s  Phone Note Call from Patient Call back at Home Phone [phone] Call back at [facility] on home # or cell [phone]    Summary of Call: Patient is requesting results of labs. Also she has a rash on her buttock which started Friday pm. She describes it as small raised areas like blisters. There is a sm area that has crusted over. No itching but c/o slight burning feeling.  Initial call taken by: Lamar Sprinkles, CMA,  November 07, 2009 9:20 AM  Follow-up for Phone Call        OV today pls - I need to see it Follow-up by: Tresa Garter MD,  November 08, 2009 7:50 AM  Additional Follow-up for Phone Call Additional follow up Details #1::        Patient advised per MD and appt set for 11/09/09 at 11:15 Additional Follow-up by: Rock Nephew CMA,  November 08, 2009 9:18 AM

## 2010-10-30 NOTE — Letter (Signed)
Summary: [facility]  [facility]them   Imported By: Marylou Mccoy 04/06/2010 16:11:51  _____________________________________________________________________  External Attachment:    Type:   Image     Comment:   External Document

## 2010-10-30 NOTE — Assessment & Plan Note (Signed)
Summary: 1 MTH FU  STC   Vital Signs:  Patient profile:   65 year old female Weight:      171 pounds O2 Sat:      99 % Temp:     99.8 degrees F oral Pulse rate:   86 / minute BP sitting:   134 / 104  (left arm)  Vitals Entered By: Tora Perches (November 03, 2009 10:03 AM) CC: f/u Is Patient Diabetic? No   Primary Care Provider:  Tresa Garter MD  CC:  f/u.  History of Present Illness: C/o dull and sharp pain in chest, back, and abd and in R leg x 1 wk. No SOB, no cough. Sharp pain is 8/10 when bad. o injury or fever. No syncope.  Preventive Screening-Counseling & Management  Alcohol-Tobacco     Smoking Status: never  Current Medications (verified): 1)  Lasix 40 Mg Tabs (Furosemide) .Marland Kitchen.. 1 By Mouth Once Daily As Needed Swelling 2)  Norvasc 5 Mg  Tabs (Amlodipine Besylate) .Marland Kitchen.. 1 By Mouth Qd 3)  Aciphex 20 Mg Tbec (Rabeprazole Sodium) .... Take 1 Tab Every Morning 4)  Aspirin 81 Mg  Tbec (Aspirin) .... One By Mouth Every Day 5)  Ibuprofen 600 Mg  Tabs (Ibuprofen) .Marland Kitchen.. 1 By Mouth Two Times A Day Pc As Needed Pain 6)  Klor-Con M20 20 Meq  Tbcr (Potassium Chloride Crys Cr) .Marland Kitchen.. 1 By Mouth Bid 7)  Amitriptyline Hcl 25 Mg  Tabs (Amitriptyline Hcl) .... Once Daily 8)  Vitamin D3 1000 Unit  Tabs (Cholecalciferol) .Marland Kitchen.. 1 Qd 9)  Loratadine 10 Mg  Tabs (Loratadine) .... Once Daily As Needed Allergies 10)  Fluticasone Propionate 50 Mcg/act  Susp (Fluticasone Propionate) .... 2 Sprays Each Nostril Once Daily 11)  Ketoconazole 2 % Crea (Ketoconazole) .... Use Once Daily X 1 Month 12)  Tramadol Hcl 50 Mg Tabs (Tramadol Hcl) .Marland Kitchen.. 1-2 Tabs By Mouth Two Times A Day As Needed Pain 13)  Benazepril Hcl 40 Mg Tabs (Benazepril Hcl) .Marland Kitchen.. 1 By Mouth Once Daily For Blood Pressure  Allergies (verified): No Known Drug Allergies  Past History:  Past Medical History: Last updated: 12/15/2008 Coronary artery disease, trivial  - cath 2002 Diverticulosis, colon GERD Osteoarthritis Elev.  ANA Hypertension Vit D def Uterine polyps Dr Perlie Gold 2009 Allergic rhinitis  Social History: Last updated: 11/08/2008 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 Smoked  Review of Systems       The patient complains of chest pain, dyspnea on exertion, abdominal pain, and fever.  The patient denies anorexia, syncope, peripheral edema, prolonged cough, melena, and severe indigestion/heartburn.    Physical Exam  General:  alert, well-developed, well-nourished, and cooperative to examination.    Nose:  no external deformity, nasal discharge, mucosal pallor, and mucosal erythema.   Mouth:  No pharyngeal erythema, fair dentition, and postnasal drip.   Neck:  No deformities, masses, or tenderness noted. Lungs:  Normal respiratory effort, chest expands symmetrically. Lungs are clear to auscultation, no crackles or wheezes. Heart:  Normal rate and regular rhythm. S1 and S2 normal without gallop, murmur, click, rub or other extra sounds. Abdomen:  Bowel sounds positive,abdomen soft and non-tender without masses, organomegaly or hernias noted. Msk:  No deformity or scoliosis noted of thoracic or lumbar spine.   Pulses:  R and L carotid,radial,femoral,dorsalis pedis and posterior tibial pulses are full and equal bilaterally Extremities:  No clubbing, cyanosis, edema, or deformity noted with normal full range of motion of all joints.   Neurologic:  No cranial nerve deficits noted. Station and gait are normal. Plantar reflexes are down-going bilaterally. DTRs are symmetrical throughout. Sensory, motor and coordinative functions appear intact. Skin:  Intact without suspicious lesions or rashes Cervical Nodes:  No lymphadenopathy noted Inguinal Nodes:  No significant adenopathy Psych:  Cognition and judgment appear intact. Alert and cooperative with normal attention span and concentration. No apparent delusions, illusions, hallucinations   Impression & Recommendations:  Problem # 1:  CHEST PAIN,  UNSPECIFIED (ICD-786.50) ? etiol Assessment New  Orders: EKG w/ Interpretation (93000) WNL TLB-BMP (Basic Metabolic Panel-BMET) (80048-METABOL) TLB-CBC Platelet - w/Differential (85025-CBCD) TLB-Hepatic/Liver Function Pnl (80076-HEPATIC) TLB-TSH (Thyroid Stimulating Hormone) (84443-TSH) TLB-Udip ONLY (81003-UDIP) TLB-Sedimentation Rate (ESR) (85652-ESR) TLB-Cardiac Panel (54098_11914-NWGN) Radiology Referral (Radiology)  Problem # 2:  ABDOMINAL PAIN, EPIGASTRIC (ICD-789.06)  Orders: Radiology Referral (Radiology) CT chest  Problem # 3:  LEG PAIN (ICD-729.5) Assessment: New  Orders: Radiology Referral (Radiology)  Problem # 4:  FEVER UNSPECIFIED (ICD-780.60) Assessment: New Zpac if worse  Complete Medication List: 1)  Lasix 40 Mg Tabs (Furosemide) .Marland Kitchen.. 1 by mouth once daily as needed swelling 2)  Norvasc 5 Mg Tabs (Amlodipine besylate) .Marland Kitchen.. 1 by mouth qd 3)  Aciphex 20 Mg Tbec (Rabeprazole sodium) .... Take 1 tab every morning 4)  Aspirin 81 Mg Tbec (Aspirin) .... One by mouth every day 5)  Ibuprofen 600 Mg Tabs (Ibuprofen) .Marland Kitchen.. 1 by mouth two times a day pc as needed pain 6)  Klor-con M20 20 Meq Tbcr (Potassium chloride crys cr) .Marland Kitchen.. 1 by mouth bid 7)  Amitriptyline Hcl 25 Mg Tabs (Amitriptyline hcl) .... Once daily 8)  Vitamin D3 1000 Unit Tabs (Cholecalciferol) .Marland Kitchen.. 1 qd 9)  Loratadine 10 Mg Tabs (Loratadine) .... Once daily as needed allergies 10)  Fluticasone Propionate 50 Mcg/act Susp (Fluticasone propionate) .... 2 sprays each nostril once daily 11)  Ketoconazole 2 % Crea (Ketoconazole) .... Use once daily x 1 month 12)  Tramadol Hcl 50 Mg Tabs (Tramadol hcl) .Marland Kitchen.. 1-2 tabs by mouth two times a day as needed pain 13)  Benazepril Hcl 40 Mg Tabs (Benazepril hcl) .Marland Kitchen.. 1 by mouth once daily for blood pressure 14)  Zithromax Z-pak 250 Mg Tabs (Azithromycin) .... As dirrected  Patient Instructions: 1)  Call if you are not better in a reasonable amount of time or if worse.  Go to ER if feeling really bad! 2)  Please schedule a follow-up appointment in 3 days Prescriptions: ZITHROMAX Z-PAK 250 MG TABS (AZITHROMYCIN) as dirrected  #1 x 0   Entered and Authorized by:   Tresa Garter MD   Signed by:   Tresa Garter MD on 11/05/2009   Method used:   Print then Give to Patient   RxID:   [id number]

## 2010-10-30 NOTE — Progress Notes (Signed)
Summary: results  Phone Note Call from Patient Call back at Home Phone [phone]   Caller: Patient Call For: Tresa Garter MD Summary of Call: Pt requests a call today. Pt had a CT today and she states the tech who did it told her she had " a little bit of fluid in her lungs". She wants to know if she needs to take the med, Dr Posey Rea prescribed? Please advise, pt would like a call today. Initial call taken by: Verdell Face,  November 03, 2009 3:29 PM  Follow-up for Phone Call        Please advise. Follow-up by: Lucious Groves,  November 03, 2009 4:26 PM  Additional Follow-up for Phone Call Additional follow up Details #1::        I called her and left a VM Additional Follow-up by: Tresa Garter MD,  November 05, 2009 11:32 AM    Additional Follow-up for Phone Call Additional follow up Details #2::    Pt recieved vm but did not get all the message. Please let me know what you said in the vm and I will call pt again today. Also she did not get the zpak, should this be sent in?...........................Marland KitchenLamar Sprinkles, CMA  November 06, 2009 9:43 AM   Additional Follow-up for Phone Call Additional follow up Details #3:: Details for Additional Follow-up Action Taken: Get the Zpac CT was ok , no fluid; there was some old scar tissue present Is she better? Additional Follow-up by: Tresa Garter MD,  November 06, 2009 11:41 AM  Prescriptions: Christena Deem Z-PAK 250 MG TABS (AZITHROMYCIN) as dirrected  #1 x 0   Entered by:   Lamar Sprinkles, CMA   Authorized by:   Tresa Garter MD   Signed by:   Lamar Sprinkles, CMA on 11/06/2009   Method used:   Electronically to        [facility] [address] [id number]* (retail)       [address] [place], [place]  [address]       Ph: [phone]       Fax: [phone]   RxID:   [id number]  left mess to call office back..........................sarah douglas cma 5:50pm 11/06/09  Pt informed  .........................sarah douglas cma 8:27am 11/07/2009

## 2010-10-30 NOTE — Consult Note (Signed)
Summary: [facility]   Imported By: Wilder Glade 03/23/2010 17:03:00  _____________________________________________________________________  External Attachment:    Type:   Image     Comment:   External Document

## 2010-11-01 NOTE — Medication Information (Signed)
Summary: Prior autho & approved for Amlodipine Besylate/[facility]  Prior autho & approved for Amlodipine Besylate/[facility]   Imported By: Sherian Rein 09/27/2010 09:23:45  _____________________________________________________________________  External Attachment:    Type:   Image     Comment:   External Document

## 2010-11-01 NOTE — Assessment & Plan Note (Signed)
Summary: PHYSICAL---STC   Vital Signs:  Patient profile:   65 year old female Height:      65 inches Weight:      168 pounds BMI:     28.06 Temp:     98.9 degrees F oral Pulse rate:   88 / minute Pulse rhythm:   regular Resp:     16 per minute BP sitting:   182 / 100  (left arm) Cuff size:   regular  Vitals Entered By: Lanier Prude, Beverly Gust) (September 21, 2010 1:51 PM) CC: CPX Is Patient Diabetic? No   Primary Care Fontella Shan:  Tresa Garter MD  CC:  CPX.  History of Present Illness: The patient presents for a preventive health examination  C/o abd pains at times in epig area, mild and infrequent  Current Medications (verified): 1)  Lasix 40 Mg Tabs (Furosemide) .Marland Kitchen.. 1 By Mouth Once Daily As Needed Swelling 2)  Norvasc 5 Mg  Tabs (Amlodipine Besylate) .Marland Kitchen.. 1 By Mouth Two Times A Day If Needed 3)  Aciphex 20 Mg Tbec (Rabeprazole Sodium) .... Take 1 Tab Every Morning 4)  Aspirin 81 Mg  Tbec (Aspirin) .... One By Mouth Every Day 5)  Ibuprofen 600 Mg  Tabs (Ibuprofen) .Marland Kitchen.. 1 By Mouth Two Times A Day Pc As Needed Pain 6)  Klor-Con M20 20 Meq  Tbcr (Potassium Chloride Crys Cr) .Marland Kitchen.. 1 By Mouth Bid 7)  Amitriptyline Hcl 25 Mg  Tabs (Amitriptyline Hcl) .... Once Daily 8)  Vitamin D3 1000 Unit  Tabs (Cholecalciferol) .Marland Kitchen.. 1 Qd 9)  Loratadine 10 Mg  Tabs (Loratadine) .... Once Daily As Needed Allergies 10)  Fluticasone Propionate 50 Mcg/act  Susp (Fluticasone Propionate) .... 2 Sprays Each Nostril Once Daily 11)  Ketoconazole 2 % Crea (Ketoconazole) .... Use Once Daily X 1 Month 12)  Tramadol Hcl 50 Mg Tabs (Tramadol Hcl) .Marland Kitchen.. 1-2 Tabs By Mouth Two Times A Day As Needed Pain 13)  Benazepril Hcl 40 Mg Tabs (Benazepril Hcl) .Marland Kitchen.. 1 By Mouth Once Daily For Blood Pressure 14)  Triamcinolone Acetonide 0.5 % Crea (Triamcinolone Acetonide) .... Use Two Times A Day Prn 15)  Anusol-Hc 25 Mg Supp (Hydrocortisone Acetate) .... Insert 1 Suppository Into Rectum At Bedtime X 10 Days Then Use  As Needed.  Allergies (verified): No Known Drug Allergies  Past History:  Past Medical History: Last updated: 03/26/2010 Coronary artery disease, trivial  - cath 2002 Diverticulosis, colon INT HEMORRHOIDS COLON POLYPS,HYPERPLASTIC GERD Osteoarthritis Elev. 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  The patient denies anorexia, fever, weight loss, weight gain, vision loss, decreased hearing, hoarseness, chest pain, syncope, dyspnea on exertion, peripheral edema, prolonged cough, headaches, hemoptysis, melena, hematochezia, severe indigestion/heartburn, hematuria, incontinence, genital sores, muscle weakness, suspicious skin lesions, transient blindness, difficulty walking, depression, unusual weight change, abnormal bleeding, enlarged lymph nodes, angioedema, and breast masses.    Physical Exam  General:  alert, well-developed, well-nourished, and cooperative to examination.    Head:  Normocephalic and atraumatic without obvious abnormalities. No apparent alopecia or balding. Eyes:  No corneal or conjunctival inflammation noted. EOMI. Perrla.  Ears:  External ear exam shows no  significant lesions or deformities.  Otoscopic examination reveals clear canals, tympanic membranes are intact bilaterally without bulging, retraction, inflammation or discharge. Hearing is grossly normal bilaterally. Nose:  External nasal examination shows no deformity or inflammation. Nasal mucosa are pink and moist without lesions or exudates. Mouth:  Oral mucosa and oropharynx without lesions or exudates.  Teeth  in good repair. Neck:  No deformities, masses, or tenderness noted. Lungs:  Normal respiratory effort, chest expands symmetrically. Lungs are clear to auscultation, no crackles or wheezes. Heart:  Normal rate and regular rhythm. S1 and S2 normal without gallop, murmur, click, rub or other extra sounds. Abdomen:  Bowel sounds positive,abdomen soft and non-tender without masses, organomegaly or hernias noted. Msk:  No deformity or scoliosis noted of thoracic or lumbar spine.   Pulses:  R and L carotid,radial,femoral,dorsalis pedis and posterior tibial pulses are full and equal bilaterally Extremities:  No clubbing, cyanosis, edema, or deformity noted with normal full range of motion of all joints.   Neurologic:  No cranial nerve deficits noted. Station and gait are normal. Plantar reflexes are down-going bilaterally. DTRs are symmetrical throughout. Sensory, motor and coordinative functions appear intact. Skin:  Intact without suspicious lesions or rashes Cervical Nodes:  No lymphadenopathy noted Inguinal Nodes:  No significant adenopathy Psych:  Cognition and judgment appear intact. Alert and cooperative with normal attention span and concentration. No apparent delusions, illusions, hallucinations   Impression & Recommendations:  Problem # 1:  HEALTH MAINTENANCE EXAM (ICD-V70.0) Assessment New GYN exam q 12 months  Orders: T-Bone Densitometry (16109) ordered The labs were reviewed with the patient.  Health and age related issues were discussed. Available screening tests and vaccinations were discussed as well. Healthy life style including good diet and exercise was discussed.   Problem # 2:  HYPERTENSION (ICD-401.9) Assessment: Deteriorated Rechecked BP 160/80 Risks of noncompliance with treatment discussed. Compliance encouraged.  The following medications were removed from the medication list:    Lasix 40 Mg Tabs (Furosemide) .Marland Kitchen... 1 by mouth once daily as needed swelling Her updated  medication list for this problem includes:    Triamterene-hctz 37.5-25 Mg Tabs (Triamterene-hctz) .Marland Kitchen... 1 by mouth once daily for blood pressure    Benazepril Hcl 40 Mg Tabs (Benazepril hcl) .Marland Kitchen... 1 by mouth once daily for blood pressure    Norvasc 5 Mg Tabs (Amlodipine besylate) .Marland Kitchen... 1 by mouth two times a day if needed  BP today: 182/100 Prior BP: 150/93 (03/26/2010)  Labs Reviewed: K+: 3.6 (09/14/2010) Creat: : 0.7 (09/14/2010)   Chol: 161 (09/14/2010)   HDL: 48.00 (09/14/2010)   LDL: 102 (09/14/2010)   TG: 56.0 (09/14/2010)  Problem # 3:  ABDOMINAL PAIN, EPIGASTRIC (ICD-789.06) mild Assessment: New  Problem # 4:  HEADACHE (ICD-784.0) Assessment: Improved  The following medications were removed from the medication list:    Tramadol Hcl 50 Mg Tabs (Tramadol hcl) .Marland Kitchen... 1-2 tabs by mouth two times a day as needed pain Her updated medication list for this problem includes:    Aspirin 81 Mg Tbec (Aspirin) ..... One by mouth every day    Ibuprofen 600 Mg Tabs (Ibuprofen) .Marland Kitchen... 1 by mouth two times a day pc as needed pain  Complete Medication List: 1)  Triamterene-hctz 37.5-25 Mg Tabs (Triamterene-hctz) .Marland Kitchen.. 1 by mouth once daily for blood pressure 2)  Benazepril Hcl 40 Mg Tabs (Benazepril hcl) .Marland Kitchen.. 1 by mouth once daily for blood pressure 3)  Norvasc 5 Mg Tabs (Amlodipine besylate) .Marland Kitchen.. 1 by mouth two times a day  if needed 4)  Klor-con M20 20 Meq Tbcr (Potassium chloride crys cr) .Marland Kitchen.. 1 by mouth bid 5)  Aspirin 81 Mg Tbec (Aspirin) .... One by mouth every day 6)  Ibuprofen 600 Mg Tabs (Ibuprofen) .Marland Kitchen.. 1 by mouth two times a day pc as needed pain 7)  Amitriptyline Hcl 25 Mg Tabs (Amitriptyline hcl) .... Once daily 8)  Vitamin D3 1000 Unit Tabs (Cholecalciferol) .Marland Kitchen.. 1 qd 9)  Triamcinolone Acetonide 0.5 % Crea (Triamcinolone acetonide) .... Use two times a day prn 10)  Anusol-hc 25 Mg Supp (Hydrocortisone acetate) .... Insert 1 suppository into rectum at bedtime x 10 days then use as  needed.  Other Orders: EKG w/ Interpretation (93000) Tdap => 28yrs IM (16109) Admin 1st Vaccine (60454)  Patient Instructions: 1)  Please schedule a follow-up appointment in 3 months. 2)  Normal BP <130/85 Prescriptions: AMITRIPTYLINE HCL 25 MG  TABS (AMITRIPTYLINE HCL) once daily  #30 Each x 5   Entered and Authorized by:   Tresa Garter MD   Signed by:   Tresa Garter MD on 09/21/2010   Method used:   Print then Give to Patient   RxID:   [id number] IBUPROFEN 600 MG  TABS (IBUPROFEN) 1 by mouth two times a day pc as needed pain  #60 x 5   Entered and Authorized by:   Tresa Garter MD   Signed by:   Tresa Garter MD on 09/21/2010   Method used:   Print then Give to Patient   RxID:   [id number] KLOR-CON M20 20 MEQ  TBCR (POTASSIUM CHLORIDE CRYS CR) 1 by mouth bid  #60.0 Each x 11   Entered and Authorized by:   Tresa Garter MD   Signed by:   Tresa Garter MD on 09/21/2010   Method used:   Print then Give to Patient   RxID:   [id number] NORVASC 5 MG  TABS (AMLODIPINE BESYLATE) 1 by mouth two times a day if needed  #30 x 11   Entered and Authorized by:   Tresa Garter MD   Signed by:   Tresa Garter MD on 09/21/2010   Method used:   Print then Give to Patient   RxID:   [id number] BENAZEPRIL HCL 40 MG TABS (BENAZEPRIL HCL) 1 by mouth once daily for blood pressure  #30 x 11   Entered and Authorized by:   Tresa Garter MD   Signed by:   Tresa Garter MD on 09/21/2010   Method used:   Print then Give to Patient   RxID:   [id number] TRIAMTERENE-HCTZ 37.5-25 MG TABS (TRIAMTERENE-HCTZ) 1 by mouth once daily for blood pressure  #30 x 11   Entered and Authorized by:   Tresa Garter MD   Signed by:   Tresa Garter MD on 09/21/2010   Method used:   Print then Give to Patient   RxID:   [id number] TRIAMTERENE-HCTZ 37.5-25 MG TABS (TRIAMTERENE-HCTZ) 1 by mouth once daily for blood  pressure  #30 x 11   Entered and Authorized by:   Tresa Garter MD   Signed by:   Tresa Garter MD on 09/21/2010   Method used:   Print then Give to Patient   RxID:   [id number]    Orders Added: 1)  EKG w/ Interpretation [93000] 2)  Tdap => 33yrs IM [90715] 3)  Admin 1st Vaccine [90471] 4)  T-Bone Densitometry [77080] 5)  Est. Patient 40-64 years [15176]  Immunizations Administered:  Tetanus Vaccine:    Vaccine Type: Tdap    Site: left deltoid    Mfr: GlaxoSmithKline    Dose: 0.5 ml    Route: IM    Given by: Lanier Prude, CMA(AAMA)    Exp. Date: 07/19/2012    Lot #: [id number]    VIS given: 08/17/08 version given September 21, 2010.   Contraindications/Deferment of Procedures/Staging:    Test/Procedure: FLU VAX    Reason for deferment: patient declined   Immunizations Administered:  Tetanus Vaccine:    Vaccine Type: Tdap    Site: left deltoid    Mfr: GlaxoSmithKline    Dose: 0.5 ml    Route: IM    Given by: Lanier Prude, CMA(AAMA)    Exp. Date: 07/19/2012    Lot #: [id number]    VIS given: 08/17/08 version given September 21, 2010.  Prevention & Chronic Care Immunizations   Influenza vaccine: Not documented   Influenza vaccine deferral: patient declined  (09/21/2010)    Tetanus booster: 09/21/2010: Tdap    Pneumococcal vaccine: Not documented    H. zoster vaccine: Not documented  Colorectal Screening   Hemoccult: Not documented    Colonoscopy: DONE  (07/27/2009)   Colonoscopy due: 07/2019  Other Screening   Pap smear: normal  (07/02/2010)    Mammogram: normal  (07/02/2010)    DXA bone density scan: Not documented   Smoking status: never  (11/09/2009)  Lipids   Total Cholesterol: 161  (09/14/2010)   LDL: 102  (09/14/2010)   LDL Direct: Not documented   HDL: 48.00  (09/14/2010)   Triglycerides: 56.0  (09/14/2010)  Hypertension   Last Blood Pressure: 182 / 100  (09/21/2010)   Serum creatinine: 0.7  (09/14/2010)    Serum potassium 3.6  (09/14/2010)  Self-Management Support :    Hypertension self-management support: Not documented    Preventive Care Screening  Mammogram:    Date:  07/02/2010    Results:  normal   Pap Smear:    Date:  07/02/2010    Results:  normal

## 2010-11-01 NOTE — Progress Notes (Signed)
Summary: PA-Amlodipine  Phone Note Outgoing Call   Summary of Call: PA-Amlodipine called [facility] @ [phone], awaiting form Dagoberto Reef  September 12, 2010 3:53 PM Form to Dr Posey Rea to complete. Dagoberto Reef  September 12, 2010 4:33 PM  PA Faxed to [facility] @ [phone], awaiting approval Dagoberto Reef  September 17, 2010 4:55 PM  PA approved 09/17/10-09/17/11, pt aware. Initial call taken by: Dagoberto Reef,  September 20, 2010 12:54 PM

## 2010-11-01 NOTE — Letter (Signed)
Summary: Primary Care Appointment Letter  [facility]  [address] [place], [place] [address]   Phone: [phone]  Fax: [phone]    10/19/2010 MRN: [id number]  [facility] [address] [place], [place]  [address]  Dear Ms. Trivedi,   Your Primary Care Physician Tresa Garter MD has indicated that:    _______it is time to schedule an appointment.    _______you missed your appointment on______ and need to call and          reschedule.    _______you need to have lab work done.    _______you need to schedule an appointment discuss lab or test results.    ___X____you need to call to reschedule your appointment that is                       scheduled on December 21, 2010 with Dr. Posey Rea.  Please call the office.     Please call our office as soon as possible. Our phone number is [phone]. Please press option 1. Our office is open 8a-12noon and 1p-5p, Monday through Friday.     Thank you,    [facility]

## 2010-11-01 NOTE — Miscellaneous (Signed)
Summary: BONE DENSITY  Clinical Lists Changes  Orders: Added new Test order of T-Lumbar Vertebral Assessment (77082) - Signed 

## 2010-11-02 NOTE — Procedures (Signed)
Summary: [facility]   Imported By: Sherian Rein 03/23/2010 11:38:53  _____________________________________________________________________  External Attachment:    Type:   Image     Comment:   External Document

## 2011-01-31 ENCOUNTER — Encounter (HOSPITAL_COMMUNITY): Payer: Self-pay | Admitting: Radiology

## 2011-01-31 ENCOUNTER — Emergency Department (HOSPITAL_COMMUNITY): Payer: BC Managed Care – PPO

## 2011-01-31 ENCOUNTER — Telehealth: Payer: Self-pay | Admitting: Internal Medicine

## 2011-01-31 ENCOUNTER — Emergency Department (HOSPITAL_COMMUNITY)
Admission: EM | Admit: 2011-01-31 | Discharge: 2011-01-31 | Disposition: A | Discharge status: Home / Self Care | Payer: BC Managed Care – PPO | Attending: Emergency Medicine | Admitting: Emergency Medicine

## 2011-01-31 DIAGNOSIS — R935 Abnormal findings on diagnostic imaging of other abdominal regions, including retroperitoneum: Secondary | ICD-10-CM | POA: Insufficient documentation

## 2011-01-31 DIAGNOSIS — K922 Gastrointestinal hemorrhage, unspecified: Secondary | ICD-10-CM | POA: Insufficient documentation

## 2011-01-31 DIAGNOSIS — K644 Residual hemorrhoidal skin tags: Secondary | ICD-10-CM | POA: Insufficient documentation

## 2011-01-31 DIAGNOSIS — D259 Leiomyoma of uterus, unspecified: Secondary | ICD-10-CM | POA: Insufficient documentation

## 2011-01-31 DIAGNOSIS — I10 Essential (primary) hypertension: Secondary | ICD-10-CM | POA: Insufficient documentation

## 2011-01-31 DIAGNOSIS — R1031 Right lower quadrant pain: Secondary | ICD-10-CM | POA: Insufficient documentation

## 2011-01-31 HISTORY — DX: Essential (primary) hypertension: I10

## 2011-01-31 LAB — URINALYSIS, ROUTINE W REFLEX MICROSCOPIC
Bilirubin Urine: NEGATIVE
Glucose, UA: NEGATIVE mg/dL
Hgb urine dipstick: NEGATIVE
Ketones, ur: NEGATIVE mg/dL
Nitrite: NEGATIVE
Specific Gravity, Urine: 1.003 — ABNORMAL LOW (ref 1.005–1.030)
pH: 7 (ref 5.0–8.0)

## 2011-01-31 LAB — CBC
HCT: 35 % — ABNORMAL LOW (ref 36.0–46.0)
MCH: 32.4 pg (ref 26.0–34.0)
MCHC: 33.7 g/dL (ref 30.0–36.0)
MCV: 96.2 fL (ref 78.0–100.0)
Platelets: 219 10*3/uL (ref 150–400)
RDW: 12.4 % (ref 11.5–15.5)
WBC: 3.6 10*3/uL — ABNORMAL LOW (ref 4.0–10.5)

## 2011-01-31 LAB — DIFFERENTIAL
Eosinophils Absolute: 0.2 10*3/uL (ref 0.0–0.7)
Eosinophils Relative: 5 % (ref 0–5)
Lymphocytes Relative: 36 % (ref 12–46)
Lymphs Abs: 1.3 10*3/uL (ref 0.7–4.0)
Monocytes Absolute: 0.3 10*3/uL (ref 0.1–1.0)

## 2011-01-31 LAB — OCCULT BLOOD, POC DEVICE: Fecal Occult Bld: POSITIVE

## 2011-01-31 MED ORDER — IOHEXOL 300 MG/ML  SOLN
100.0000 mL | Freq: Once | INTRAMUSCULAR | Status: AC | PRN
  Administered 2011-01-31: 100 mL via INTRAVENOUS

## 2011-01-31 NOTE — Telephone Encounter (Signed)
Pt was seen in the ER earlier today for rectal bleeding and was told to call and set up an appt with her GI doc tomorrow. Pt scheduled to see Amy Esterwood PA 02/01/11@9 :30am. Pt aware of appt date and time.

## 2011-01-31 NOTE — Telephone Encounter (Signed)
Left message for pt to call back  °

## 2011-02-01 ENCOUNTER — Encounter: Payer: Self-pay | Admitting: Physician Assistant

## 2011-02-01 ENCOUNTER — Ambulatory Visit (INDEPENDENT_AMBULATORY_CARE_PROVIDER_SITE_OTHER): Payer: BC Managed Care – PPO | Admitting: Physician Assistant

## 2011-02-01 VITALS — BP 134/76 | HR 68 | Ht 65.0 in | Wt 167.0 lb

## 2011-02-01 DIAGNOSIS — R1031 Right lower quadrant pain: Secondary | ICD-10-CM

## 2011-02-01 DIAGNOSIS — K625 Hemorrhage of anus and rectum: Secondary | ICD-10-CM

## 2011-02-01 DIAGNOSIS — K648 Other hemorrhoids: Secondary | ICD-10-CM

## 2011-02-01 LAB — POCT I-STAT, CHEM 8
Creatinine, Ser: 0.8 mg/dL (ref 0.4–1.2)
Glucose, Bld: 100 mg/dL — ABNORMAL HIGH (ref 70–99)
Hemoglobin: 12.6 g/dL (ref 12.0–15.0)
TCO2: 26 mmol/L (ref 0–100)

## 2011-02-01 LAB — URINE CULTURE
Colony Count: NO GROWTH
Culture  Setup Time: 201205031412

## 2011-02-01 MED ORDER — HYDROCORTISONE ACETATE 25 MG RE SUPP: 25.0000 mg | Freq: Every day | RECTAL | Status: AC

## 2011-02-01 NOTE — Patient Instructions (Signed)
Continue the Colace daily to help avoid constipatoin. We sent a prescription for the Anusol [facility] Suppositories to [facility] [place]. Call if bleeding continues to next week.

## 2011-02-04 NOTE — Progress Notes (Signed)
Subjective:    Patient ID: Kelly Santos, female    DOB: 1946/04/18, 65 y.o.   MRN: [id number]  HPI Kelly Santos is a pleasant 65 year old female known to Dr. Jonny Ruiz. With history of diverticular disease colon polyps and internal hemorrhoids. She also has chronic GERD. Her last colonoscopy was in October of 2010 and this showed left-sided diverticulosis and internal hemorrhoids she had 1 diminutive polyp which was hyperplastic. She comes in today after an etiology as it on 01/31/2011 due to right lower quadrant abdominal pain and rectal bleeding. She had CT scan of the abdomen and pelvis done through the ER with a finding of scattered colonic diverticuli no active diverticulitis normal appendix. Her uterus is enlarged and she has multiple low density areas in the uterus consistent with fibroids there is one soft tissue density protruding into the endometrium with surrounding fluid this may be a submucosal fibroid but cannot completely exclude other causes of endometrial mass such as polyp or neoplasm. The adnexa were benign.  She states that she had been having somenagging,throbbing right lower  quadrant discomfort for about 5 days prior to the ER visit. Her rectal bleeding started on 01/28/2011 it was bright red and occurring with bowel movements. She says that has actually improved. She has not had any significant constipation though has been using some Colace over the past few days. She has mild rectal discomfort but says that that has been more of an annoyance than any kind of significant pain. She has an appointment to see her gynecologist later this week and came here for further evaluation of the rectal bleeding.   Review of Systems  Constitutional: Negative.   HENT: Negative.   Eyes: Negative.   Respiratory: Negative.   Cardiovascular: Negative.   Gastrointestinal: Positive for abdominal pain, anal bleeding and rectal pain.  Genitourinary: Negative.   Musculoskeletal: Negative.   Skin: Negative.     Neurological: Negative.   Hematological: Negative.   Psychiatric/Behavioral: Negative.        Objective:   Physical Exam Well-developed female in no acute distress, pleasant, alert and oriented x3 HEENT; nontraumatic normocephalic EOMI PERRLA sclera anicteric  Neck ;supple no JVD  Cardiovascula;r regular rate and rhythm with S1-S2 no murmur rub or gallop  Pulmonary; clear bilaterally  Abdomen ;soft mildly tender in the right lower quadrant and suprapubic region no guarding no rebound no palpable mass or hepatosplenomegaly sounds active Rectal; Hemoccult-negative brown stool palpable fleshy internal hemorrhoids nontender  Skin warm and dry benign, Psych; mood and affect normal and appropriate .        Assessment & Plan:  #26 65 year old female with one-week history of right lower quadrant discomfort. Recent CT scan showing multiple uterine fibroids and a separate soft tissue density protruding into the endometrium question neoplasm versus polyp. No other findings to explain her pain which is very likely due to the uterine process  Plan; patient to follow up with her gynecologist this week.  #2 Minor hematochezia, likely secondary to internal hemorrhoids. Patient is currently Hemoccult-negative. Plan; continue Colace 1 daily over the next week then as needed Continue high fiber diet and liberal fluids Trial of Anusol-HC suppositories 1 per rectum each bedtime times one week then as needed should she develop recurrent bleeding Patient advised to call for followup if she is continuing to experience persistent hematochezia over the next few weeks.  #3 Diverticulosis  #4 History of colon polyps hyperplastic, due for followup 2001 he  #5 GERD Stable on AcipHex 20 mg  by mouth daily.

## 2011-02-04 NOTE — Progress Notes (Signed)
Reviewed and agree with management. Robert D. Kaplan, M.D., FACG  

## 2011-02-07 ENCOUNTER — Other Ambulatory Visit: Payer: Self-pay | Admitting: Obstetrics and Gynecology

## 2011-02-15 NOTE — Discharge Summary (Signed)
[place]. [facility]  Patient:    NOVI, CALIA                         MRN: [id number] Adm. Date:  [id number] Disc. Date: [id number] Attending:  Corwin Levins Dictator:   Janora Norlander, N.P. CC:         Corwin Levins, M.D. [facility]  Dr. Frutoso Chase C. Eden Emms, M.D. [facility]   Discharge Summary  ADMISSION DIAGNOSES: 1. Chest pain consistent with unstable angina. 2. Hypertension. 3. Hypokalemia. 4. Hypercholesterolemia. 5. Bright red blood per rectum.  DISCHARGE DIAGNOSES: 1. Chest pain, cardiac source unlikely.  The patient to follow up    gastrointestinal source as an outpatient. 2. Hypertension.  The patient was stable during admission.  Continue on    medication listed below. 3. Hypokalemia.  The patients metabolic derangement was corrected. 4. Hypercholesterolemia.  The patient to continue on medications. 5. Bright red blood per rectum.  The patient was placed on Anusol cream    with improvement.  PROCEDURES DURING ADMISSION: 1. Chest x-ray March 12, 2001.    Impression:    a. Borderline heart size.    b. Minimal streaky bibasilar atelectasis. 2. March 16, 2001, cardiac catheterization.    a. Trivial coronary artery disease by angiogram.    b. Normal left ventricular systolic function.    c. Dr. Ladona Ridgel assessed that Mrs. Mape was a 65 year old female with       multiple cardiac risk factors and trivial coronary artery disease       with well preserved left ventricular function by angiogram and that at       this point continued cardiac risk factors modification will be pursued       and other causes of chest pain be investigated.  LABORATORY:  Cholesterol 163, triglyceride 133, HDL 43, LDL 93.  BMET on March 15, 2001, was normal.  DISCHARGE MEDICATIONS:  The patient is to restart her previous medications and add Protonix 40 mg one by mouth q.d. per Gordy Savers, M.D.  BRIEF HISTORY OF ADMISSION:  Mrs. Mape had approximately one week  of nonchest discomfort, which progressed into chest pressure, lower substernal and on admission to the ER, had pressure radiating to the mid sternum, palpitations, shortness of breath, occasional nausea and some increase in these symptoms with exertion.  She was admitted to rule out chest pain consistent with unstable angina.  Her vital signs in the ER were 150/100, heart rate was 77, she was afebrile and she was saturating at 100% on two liters of oxygen.  She was hemodynamically stable.  Her coags were stable as well.  Cardiology was consulted and catheterization was scheduled with the above results.  Pending catheterization, the patient stated that she was not having chest pain, that she felt that the pain was more gas related. She reported headaches on occasion but then would state that the headaches were present when she took nitroglycerin for chest pain.  The risks and benefits of catheterization were explained to her by the cardiology team.  She underwent the procedure on March 16, 2001, with trivial CAD and normal left function as a diagnosis.  EXAM ON DISCHARGE:  There are no current complaints. She has no chest pain. Her headaches, she states, are related to the nitroglycerin she received prior to previous episodes of chest pain.  Her vital signs are 146/74, she is at 98 degrees p.o., she is  at 18, 70, and 100% on room air.  She is awake, alert and oriented. There is no edema in her extremities.  Heart is at a regular rate and rhythm.  Her lungs are clear to auscultation.  She is ambulating in her room without distress.  HOSPITAL COURSE: #1 - CHEST PAIN:  She is to follow up with her primary physician and together search for a possible gastrointestinal cause to her chest pain.  #2 - BRIGHT RED BLOOD PER RECTUM:  Decreased from seeing red blood in the toilet bowel to minimal spotting on tissue paper. She is to continue the Anusol on an outpatient basis.  Her Heme stool was  negative.  #3 - HYPERTENSION:  She was generally controlled on her current medications during this admission; however, at times had diastolic pressures in the 100 to 103 or 104 range.  It is possible that this might have been a cause for her has.  Metabolic derangement was corrected.  She did have a white blood cell count of 3.4 that may be followed up as needed on an outpatient basis.  DISPOSITION:  She is being discharged today in stable condition.  There is no complaint of chest pain.  The patient is aware that should she suffer any episodes of chest pain, shortness of breath, diaphoresis, nausea, or any other signs of heart attack, that she is to call 911. DD:  03/17/01 TD:  03/17/01 Job: [id number]

## 2011-02-15 NOTE — Cardiovascular Report (Signed)
[place]. [facility]  Patient:    SERRA, YOUNAN                         MRN: [id number] Proc. Date: 03/16/01 Adm. Date:  [id number] Attending:  Corwin Levins CC:         Doylene Canning. Ladona Ridgel, M.D. [facility]  Corwin Levins, M.D. [facility]  Gordy Savers, M.D.   Cardiac Catheterization  PROCEDURES PERFORMED: 1. Left heart catheterization. 2. Left ventriculogram. 3. Selective coronary angiography. 4. Abdominal aortogram.  DIAGNOSES: 1. Trivial coronary artery disease by angiogram. 2. Normal left ventricular systolic function.  HISTORY:  Ms. Jarmon is a 65 year old black female with multiple cardiac risk factors who presents with substernal chest discomfort.  The patient had previously undergone an assessment for chest pain with stress imaging study in May of 2001 showing ST depression in the inferolateral leads with no evidence of perfusion defect.  She has been treated medically; however, she has had recurrence of chest discomfort.  She was admitted to the hospital and subsequently ruled out for acute myocardial infarction and she presents now for further assessment.  TECHNIQUE:  Informed consent was obtained, patient was brought to the catheterization lab and a 6-French sheath was placed in the right femoral artery.  Left heart catheterization and selective angiography were then performed in the usual fashion using preformed 6-French Judkins catheters.  An abdominal aortogram was performed using a pigtail catheter.  At the termination of the case, the catheters and sheaths were removed and manual pressure applied until adequate hemostasis was achieved.  The patient tolerated the procedure well and was transferred to the floor in stable condition.  FINDINGS: 1. Left main trunk:  Large-caliber vessel with mild irregularities. 2. LAD:  This is a large-caliber vessel that provides a large first septal    perforator as well as medium-caliber first diagonal branch  in the proximal    segment and several smaller trivial diagonal branches noted distally.    There are luminal irregularities in the LAD system. 3. Left circumflex artery:  This is a large-caliber vessel that provides four    marginal branches.  There are mild irregularities in the left circumflex    system. 4. Right coronary artery:  Dominant.  This is a medium-caliber vessel that    provides a small posterior descending artery and small posterior    interventricular branch in its terminal segment.  The body of the right    coronary artery has luminal irregularities.  The posterior descending    artery is noted to taper significantly in its midsection, which appears to    be due to her natural anatomy rather than atherosclerotic disease. 5. LV:  Normal end-systolic and end-diastolic dimensions.  Overall left    ventricular function is well-prescribed.  Ejection fraction is greater than    65%.  No mitral regurgitation.  LV pressure is 170/10.  Aortic was 170/100.    LVEDP equal 20. 6. Abdominal aorta:  The infrarenal segment is of normal caliber without    significant stenosis or dilatation.  The renal arteries are single and    patent bilaterally.  The iliac arteries are widely patent bilaterally with    only mild irregularities.  ASSESSMENT AND PLAN:  Ms. Liberati is a 65 year old female with multiple cardiac risk factors and trivial coronary artery disease with well-preserved left ventricular function by angiogram.  At this point, continued cardiac risk factors modification will be  pursued and other causes of chest pain investigated. DD:  03/16/01 TD:  03/16/01 Job: [id number]

## 2011-02-15 NOTE — H&P (Signed)
[place]. [facility]  Patient:    Kelly Santos, Kelly Santos                         MRN: [id number] Adm. Date:  [id number] Attending:  Corwin Levins CC:         Sonda Primes, M.D. [facility]   History and Physical  CHIEF COMPLAINT: Chest pain.  HISTORY OF PRESENT ILLNESS: The patient is a 65 year old black female, where with one week of nonchest discomfort, and she exhibits positive LeVeen sign, describing her discomfort as a chest pressure, lower substernal, somewhat radiating today toward the mid sternum, associated with shortness of breath, palpitations, occasional nausea, and some increase with exertion, not responsive to multiple over-the-counter antacids prior to GI cocktail in the ER tonight.  She came here when the pain increased in severity to 6/10, now gone, and lasted just over one hour.  PAST MEDICAL HISTORY:  1. Hypertension.  2. Hypercholesterolemia.  3. Anemia.  4. History of diverticulosis.  Risk factors are otherwise negative for chronic smoking and diabetes.  PAST SURGICAL HISTORY: D&C.  ALLERGIES: No known drug allergies.  CURRENT MEDICATIONS:  1. Avalide 150/12.5 mg one q.d.  2. Lasix 40 mg p.o. q.d.  3. Ecotrin 81 mg p.r.n.  SOCIAL HISTORY: No tobacco, no alcohol.  Married with three children.  FAMILY HISTORY: Sister with hypertension.  Father deceased with lung cancer. Mother on dialysis and CHF.  REVIEW OF SYSTEMS: She has had three days of intermittent off and on small amounts of bright red blood per rectum without discomfort.  She also has occasional reflux symptoms that seem different from what she has tonight.  PHYSICAL EXAMINATION:  VITAL SIGNS: Blood pressure 150/110, heart rate 77, respirations 20. Afebrile.  Oxygen saturation 100% on two liters.  HEENT: Sclerae clear.  TMs clear.  Pharynx benign.  NECK: Without lymphadenopathy, JVD, thyromegaly.  CHEST: No rales or wheezing.  No chest wall tenderness.  CARDIAC: Regular  rate and rhythm, no murmur.  ABDOMEN: Soft, nontender, positive bowel sounds, no organomegaly or masses.  EXTREMITIES: No edema.  NEUROLOGIC: Cranial nerves 2-12 intact, otherwise nonfocal.  LABORATORY DATA: Initial CPK 226, MB pending.  CBC within normal limits.  An i-STAT showed a hemoglobin of 14, potassium 3.4, glucose 95, BUN 10, creatinine 0.9.  INR 1.0.  Chest x-ray shows no acute disease.  EKG shows sinus rhythm, no acute changes.  ASSESSMENT/PLAN:  1. Chest pain consistent with unstable angina.  She will be admitted.     Started taking Ecotrin today.  Place nitroglycerin patch.  Consider     intravenous nitroglycerin for her current pain.  Lovenox subcu b.i.d.,     usual dose for her weight.  Refuses beta-blocker.  I had discussion with     the patient because of "too many meds".  Will place on telemetry and rule     out MI with serial CPK and MB fraction.  Consider cardiac consultation.     Possibly inpatient cardiac catheterization versus stress testing if she     "rules out."  2. Hypertension.  Continue Avalide.  3. Hypokalemia.  Currently on two diuretics, hydrochlorothiazide and Avalide     and Lasix.  Will DC Lasix.  4. Hypercholesterolemia.  5. Bright red blood per rectum.  Will guaiac stools.  Consider GI outpatient     evaluation and watch for increased activity on Lovenox.  There is no     anemia present.  Coags  normal.  Hemodynamically stable.  Felt risk of     Lovenox outweighs benefit. DD:  03/12/01 TD:  03/13/01 Job: [id number]

## 2011-02-15 NOTE — Op Note (Signed)
[facility]  Patient:    Kelly Santos, Kelly Santos                         MRN: [id number] Proc. Date: 11/23/99 Adm. Date:  [id number] Attending:  Marcelle Overlie                           Operative Report  PREOPERATIVE DIAGNOSIS:  Postmenopausal bleeding.  POSTOPERATIVE DIAGNOSIS:  Postmenopausal bleeding.  OPERATION:  Dilatation and curettage.  SURGEON:  Marcelle Overlie, M.D.  ANESTHESIA:  MAC with paracervical block.  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  COMPLICATIONS:  None.  PATHOLOGY:  Uterine curettings.  ESTIMATED BLOOD LOSS:  DESCRIPTION OF PROCEDURE:  The patient was taken to the operating room.  She was given intravenous sedation.  She was then placed in the low lithotomy position.  The vagina and vulva were then prepped and draped in the usual sterile fashion. In and out catheter was used to empty the bladder of clear urine.  A speculum was placed in the vagina.  The cervix was grasped with a tenaculum and a paracervical block was infiltrated at 5 and 7 oclock.  The uterus was then sounded to 11 cm.  The cervical internal os was then gently dilated using Pratt dilators.  Using a  sharp curet, the uterine cavity was then thoroughly curetted of a moderate amount of tissue.  All of it was sent to pathology for analysis.  There was no vaginal  bleeding at the end of the procedure.  All sponge, lap and instrument counts were correct x 2.  All instruments were removed from the vagina.  The patient tolerated the procedure well and went to the recovery room in stable condition. DD:  11/23/99 TD:  11/24/99 Job: [id number]

## 2011-05-08 ENCOUNTER — Emergency Department (HOSPITAL_COMMUNITY): Payer: BC Managed Care – PPO

## 2011-05-08 ENCOUNTER — Inpatient Hospital Stay (HOSPITAL_COMMUNITY)
Admission: EM | Admit: 2011-05-08 | Discharge: 2011-05-10 | DRG: 134 | Disposition: A | Discharge status: Home / Self Care | Payer: BC Managed Care – PPO | Source: Ambulatory Visit | Attending: Family Medicine | Admitting: Family Medicine

## 2011-05-08 DIAGNOSIS — E669 Obesity, unspecified: Secondary | ICD-10-CM | POA: Diagnosis present

## 2011-05-08 DIAGNOSIS — I10 Essential (primary) hypertension: Principal | ICD-10-CM | POA: Diagnosis present

## 2011-05-08 DIAGNOSIS — Z91199 Patient's noncompliance with other medical treatment and regimen due to unspecified reason: Secondary | ICD-10-CM

## 2011-05-08 DIAGNOSIS — Z7982 Long term (current) use of aspirin: Secondary | ICD-10-CM

## 2011-05-08 DIAGNOSIS — E876 Hypokalemia: Secondary | ICD-10-CM | POA: Diagnosis present

## 2011-05-08 DIAGNOSIS — Z6829 Body mass index (BMI) 29.0-29.9, adult: Secondary | ICD-10-CM

## 2011-05-08 DIAGNOSIS — E559 Vitamin D deficiency, unspecified: Secondary | ICD-10-CM | POA: Diagnosis present

## 2011-05-08 DIAGNOSIS — IMO0001 Reserved for inherently not codable concepts without codable children: Secondary | ICD-10-CM | POA: Diagnosis present

## 2011-05-08 DIAGNOSIS — Z9119 Patient's noncompliance with other medical treatment and regimen: Secondary | ICD-10-CM

## 2011-05-08 DIAGNOSIS — Z79899 Other long term (current) drug therapy: Secondary | ICD-10-CM

## 2011-05-08 LAB — COMPREHENSIVE METABOLIC PANEL
ALT: 21 U/L (ref 0–35)
AST: 24 U/L (ref 0–37)
Albumin: 3.6 g/dL (ref 3.5–5.2)
CO2: 28 mEq/L (ref 19–32)
Chloride: 103 mEq/L (ref 96–112)
Creatinine, Ser: 0.65 mg/dL (ref 0.50–1.10)
GFR calc non Af Amer: >60 mL/min (ref >60)
Sodium: 139 mEq/L (ref 135–145)
Total Bilirubin: 0.3 mg/dL (ref 0.3–1.2)

## 2011-05-08 LAB — URINALYSIS, ROUTINE W REFLEX MICROSCOPIC
Bilirubin Urine: NEGATIVE
Hgb urine dipstick: NEGATIVE
Ketones, ur: NEGATIVE mg/dL
Nitrite: NEGATIVE
Specific Gravity, Urine: 1.005 (ref 1.005–1.030)
Urobilinogen, UA: 0.2 mg/dL (ref 0.0–1.0)
pH: 7 (ref 5.0–8.0)

## 2011-05-08 LAB — CBC
HCT: 39.7 % (ref 36.0–46.0)
Hemoglobin: 13.4 g/dL (ref 12.0–15.0)
MCH: 32.1 pg (ref 26.0–34.0)
MCHC: 33.8 g/dL (ref 30.0–36.0)
MCV: 95.2 fL (ref 78.0–100.0)
RBC: 4.17 MIL/uL (ref 3.87–5.11)

## 2011-05-08 LAB — APTT: aPTT: 30 seconds (ref 24–37)

## 2011-05-08 LAB — DIFFERENTIAL
Eosinophils Relative: 5 % (ref 0–5)
Lymphocytes Relative: 37 % (ref 12–46)
Monocytes Absolute: 0.3 10*3/uL (ref 0.1–1.0)
Monocytes Relative: 9 % (ref 3–12)
Neutro Abs: 1.4 10*3/uL — ABNORMAL LOW (ref 1.7–7.7)

## 2011-05-08 LAB — PROTIME-INR: INR: 0.98 (ref 0.00–1.49)

## 2011-05-09 ENCOUNTER — Observation Stay (HOSPITAL_COMMUNITY): Payer: BC Managed Care – PPO

## 2011-05-09 LAB — COMPREHENSIVE METABOLIC PANEL
BUN: 10 mg/dL (ref 6–23)
CO2: 27 mEq/L (ref 19–32)
Calcium: 9.9 mg/dL (ref 8.4–10.5)
Chloride: 103 mEq/L (ref 96–112)
Creatinine, Ser: 0.58 mg/dL (ref 0.50–1.10)
GFR calc Af Amer: >60 mL/min (ref >60)
GFR calc non Af Amer: >60 mL/min (ref >60)
Total Bilirubin: 0.3 mg/dL (ref 0.3–1.2)

## 2011-05-09 LAB — PHOSPHORUS: Phosphorus: 4.1 mg/dL (ref 2.3–4.6)

## 2011-05-09 LAB — CBC
HCT: 41.2 % (ref 36.0–46.0)
Hemoglobin: 14 g/dL (ref 12.0–15.0)
MCH: 32.2 pg (ref 26.0–34.0)
MCHC: 34 g/dL (ref 30.0–36.0)
RDW: 12.7 % (ref 11.5–15.5)

## 2011-05-09 LAB — DIFFERENTIAL
Basophils Absolute: 0 10*3/uL (ref 0.0–0.1)
Basophils Relative: 1 % (ref 0–1)
Eosinophils Relative: 4 % (ref 0–5)
Lymphocytes Relative: 36 % (ref 12–46)
Monocytes Absolute: 0.3 10*3/uL (ref 0.1–1.0)
Monocytes Relative: 10 % (ref 3–12)
Neutro Abs: 1.5 10*3/uL — ABNORMAL LOW (ref 1.7–7.7)

## 2011-05-09 LAB — MAGNESIUM: Magnesium: 2.3 mg/dL (ref 1.5–2.5)

## 2011-05-09 LAB — PROTIME-INR: INR: 0.96 (ref 0.00–1.49)

## 2011-05-13 ENCOUNTER — Telehealth: Payer: Self-pay

## 2011-05-13 NOTE — Telephone Encounter (Signed)
Scheduled OV for hospital f/u in open 30 min apt Wed

## 2011-05-13 NOTE — Telephone Encounter (Signed)
Patient called lmovm triage stating that she in the was recently d/c from hospital on 05/10/11 for elevated BP. She was advised by scheduler to lm requesting a work in. Thanks

## 2011-05-15 ENCOUNTER — Ambulatory Visit (INDEPENDENT_AMBULATORY_CARE_PROVIDER_SITE_OTHER): Payer: BC Managed Care – PPO | Admitting: Internal Medicine

## 2011-05-15 ENCOUNTER — Telehealth: Payer: Self-pay

## 2011-05-15 ENCOUNTER — Encounter: Payer: Self-pay | Admitting: Internal Medicine

## 2011-05-15 VITALS — BP 130/98 | HR 88 | Temp 99.3°F | Resp 16 | Wt 166.0 lb

## 2011-05-15 DIAGNOSIS — I10 Essential (primary) hypertension: Secondary | ICD-10-CM

## 2011-05-15 DIAGNOSIS — I519 Heart disease, unspecified: Secondary | ICD-10-CM

## 2011-05-15 DIAGNOSIS — I5189 Other ill-defined heart diseases: Secondary | ICD-10-CM | POA: Insufficient documentation

## 2011-05-15 DIAGNOSIS — R51 Headache: Secondary | ICD-10-CM

## 2011-05-15 DIAGNOSIS — I2510 Atherosclerotic heart disease of native coronary artery without angina pectoris: Secondary | ICD-10-CM

## 2011-05-15 DIAGNOSIS — E876 Hypokalemia: Secondary | ICD-10-CM | POA: Insufficient documentation

## 2011-05-15 MED ORDER — POTASSIUM CHLORIDE CRYS ER 20 MEQ PO TBCR: 20.0000 meq | EXTENDED_RELEASE_TABLET | Freq: Two times a day (BID) | ORAL | Status: DC

## 2011-05-15 MED ORDER — AMLODIPINE BESYLATE 5 MG PO TABS: 5.0000 mg | ORAL_TABLET | Freq: Every day | ORAL | Status: DC

## 2011-05-15 MED ORDER — TRIAMTERENE-HCTZ 37.5-25 MG PO TABS: 1.0000 | ORAL_TABLET | Freq: Every day | ORAL | Status: DC

## 2011-05-15 MED ORDER — BENAZEPRIL HCL 40 MG PO TABS: 40.0000 mg | ORAL_TABLET | Freq: Every day | ORAL | Status: DC

## 2011-05-15 NOTE — Assessment & Plan Note (Signed)
On Rx 

## 2011-05-15 NOTE — Progress Notes (Signed)
  Subjective:    Patient ID: Kelly Santos, female    DOB: Sep 25, 1946, 65 y.o.   MRN: [id number]  HPI  The patient presents for a follow-up of  chronic hypertension with a recent case of malignant HTN episode, chronic dyslipidemia, CAD controlled with medicines. She has been taking her BP meds sporadically.    Review of Systems  Constitutional: Negative for chills, activity change, appetite change, fatigue and unexpected weight change.  HENT: Negative for congestion, mouth sores and sinus pressure.   Eyes: Negative for visual disturbance.  Respiratory: Negative for cough and chest tightness.   Cardiovascular: Negative for chest pain and palpitations.  Gastrointestinal: Negative for nausea and abdominal pain.  Genitourinary: Negative for frequency, difficulty urinating and vaginal pain.  Musculoskeletal: Negative for back pain and gait problem.  Skin: Negative for pallor and rash.  Neurological: Negative for dizziness, tremors, weakness, numbness and headaches.  Psychiatric/Behavioral: Negative for suicidal ideas, confusion and sleep disturbance. The patient is nervous/anxious (stress at work).        Objective:   Physical Exam  Constitutional: She appears well-developed and well-nourished. No distress.  HENT:  Head: Normocephalic.  Right Ear: External ear normal.  Left Ear: External ear normal.  Nose: Nose normal.  Mouth/Throat: Oropharynx is clear and moist.  Eyes: Conjunctivae are normal. Pupils are equal, round, and reactive to light. Right eye exhibits no discharge. Left eye exhibits no discharge.  Neck: Normal range of motion. Neck supple. No JVD present. No tracheal deviation present. No thyromegaly present.  Cardiovascular: Normal rate, regular rhythm and normal heart sounds.   Pulmonary/Chest: No stridor. No respiratory distress. She has no wheezes.  Abdominal: Soft. Bowel sounds are normal. She exhibits no distension and no mass. There is no tenderness. There is no  rebound and no guarding.  Musculoskeletal: She exhibits no edema and no tenderness.  Lymphadenopathy:    She has no cervical adenopathy.  Neurological: She displays normal reflexes. No cranial nerve deficit. She exhibits normal muscle tone. Coordination normal.  Skin: No rash noted. No erythema.  Psychiatric: She has a normal mood and affect. Her behavior is normal. Judgment and thought content normal.     Hosp. Recrds/tests/ECO reviewed     Assessment & Plan:    A complex case

## 2011-05-15 NOTE — Telephone Encounter (Signed)
Start Triamteren - it helps to conserve K better. Thx

## 2011-05-15 NOTE — H&P (Signed)
Kelly Santos, Kelly Santos NO.:  [id number]  MEDICAL RECORD NO.:  [id number]  LOCATION:  [facility]                         FACILITY:  [facility]  PHYSICIAN:  Pleas Koch, MD        DATE OF BIRTH:  November 20, 1945  DATE OF ADMISSION:  05/08/2011 DATE OF DISCHARGE:                             HISTORY & PHYSICAL   PRIMARY CARE PHYSICIAN:  Georgina Quint. Plotnikov, MD  CHIEF COMPLAINT: 1. Headache, malaise and coincidental hypertensive urgency. 2. Multiple somatic complaints inclusive of hip, back, knee pain, and     upper neck pain. 3. Noncompliance to medication.  HISTORY AND PHYSICAL:  The patient is a delightful 65 year old African American female who presents to the emergency department after noting symptoms of malaise and nausea overnight.  She states that she has had back pain for 1-2 weeks and last night developed malaise.  She did not actually vomit however.  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  She related to her supervisor and on recheck it was 210/114 and on recheck again it was 268/138.  This prompted a call to EMS and in the EMS it was noted her blood pressure went down on its own.  She was not given any medication. She relates that she has had posterior headache for a week and right temple pain as well as some left back chest pain and had some lower lumbar pain without radiating chest pain.  On review of systems, she states her headache is now 7/10 after being given Tylenol x3 in the emergency room and she was placed on Imdur on and Nitropaste of blood pressure persistently was elevated above 160/70, last reading been 178/110.  Dr. Lynelle Doctor of the emergency room was about place her on nicardipine drip however, we held off on that and hence the patient was reevaluated.  REVIEW OF SYSTEMS:  Remarkable for no chest pain, no shortness  of breath.  No dizziness.  No blurred vision other than her usual poor vision.  No falls.  No weakness on any one side of the body.  No slurred speech.  No abdominal pain.  She does have back pain, which is nonradiating in the lumbar spine.  She also has musculoskeletal neck pain.  She states in the right neck region without radiation down her arms, but radiating towards frontal temple.  She had some knee pain from possible osteoarthritis that she has been told she has per Dr. Posey Rea.  She has no dysuria.  She has no fever currently.  Her nausea and vomiting seems to have subside and was a one time event and she has not eaten anywhere suspicious recently.  PAST MEDICAL HISTORY:  Significant for: 1. Hypertension which she does not take her triamterene     hydrochlorothiazide usually.  She states she does not like taking     medications. 2. Vitamin D deficiency. 3. Chronic musculoskeletal pain for which she is on Motrin. 4. She has had a cardiac cath in June 2002 by Dr. Oliver Barre, which  showed well preserved left ventricular function by angiogram EF of     65% and trivial coronary artery disease and it was recommended at     that time medical management.  PAST SURGICAL HISTORY:  Nil.  GYNECOLOGIC HISTORY:  She is a G4, P3.  She has three kids. She had one miscarriage.  She has had abnormal bleeding in the past preventive care. She has had normal mammograms per her report recently and had a colonoscopy last year that was normal.  She does follow up with OB/GYN.  She has been told also that she might have hypothyroidism, but this was later corrected.  Dr. Marina Goodell is her gastroenterologist.  SOCIAL HISTORY:  She does not smoke or drink currently and tried things as a younger woman 27 years ago in [place] before she moved here in 1979 to [place].  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  Her next of kin is her husband Fayrene Fearing who can be  reached at [phone] as well as her daughter Marianna Fuss who can be reached at [phone] and I will update them off of her presence here.  PHYSICAL EXAM:  GENERAL:  The patient is very pleasant African American female in no specific distress.  She is oriented x3 and no facial slurring.  No strabismus.  Mild pallor.  No icterus.  Good dentition. Fundus is not visualized on left side.  However, disk is sharp without any suggestion of hypertensive retinopathy per my exam. ABDOMEN:  Soft, nontender, nondistended.  No rebound or guarding. CHEST:  Clinically clear.  No tactile vocal resonance or fremitus. NEUROLOGICALLY:  Her smile is symmetrical.  Uvula is midline.  EOMI. Power 5/5 in all major muscle groups.  Reflexes are equivocal. Coordination is excellent.  PERTINENT LABS:  Done in the emergency room as follows:  Urinalysis negative.  Troponin x1 0.00.  INR 0.98, PTT 30.  CMP was sodium 139, potassium 3.10m chloride 103, CO2 28, glucose 91, BUN 8, creatinine 0.65. LFTs are normal.  CBC showed WBC 2.9, hemoglobin 13.4, hematocrit of 39.7, RDW 12.7, platelet count 218.  She does have an neutropenia relative on her differential at 1.4.  Her WBC is consistent with the low normal blood count done Jan 31, 2011.  I am unsure why she came to emergency room at that time.  It appears that she came into the emergency room Jan 31, 2011 with small bright red blood per rectum from small external hemorrhoid and her case is discussed with Dr. Juanda Chance who recommended stopping aspirin, as such we will discontinue her aspirin. She also has large fibroids and the mass and will need to follow up with OB/GYN per the emergency room dictation.  EKG done today shows a rate of 16, QRS axis of 20.  No ST-T wave changes.  PR interval is 0.20 borderline first-degree AV block.  No T- wave inversions and compared to EKG done December 09, 2003 note that T-wave inversions in V2 and V3 are now resolved.  There are no Q-waves.  V2,  V3 and V4 are now resolved.  She had a CT of the head today showing no evidence of brain mass, brain hemorrhage, or acute infarction bilateral basal ganglia calcifications seen.  Nonaneurysmal arterial calcifications seen.  No acute or active brain process seen.  No skull lesions evident.  No sinusitis evident.  No significant change from previous study.  Two-view chest x-ray showed cardiomegaly with no active  disease.  IMPRESSION/ASSESSMENT: 1. Hypertensive urgency- This is likely secondary to noncompliance to     medication in consistent with coincidental pain from neck pain and     multiple region.  The patient is on Motrin for the same and I would     encourage her to stop the same given the fact that she has had a     recent GI bleed.  I will place her on some pain medication for the     same.  Her EKG is normal.  She likely would benefit from good     dietetic counseling which we will order.  I have given her     labetalol 10 mg IV in the ED and held off on nicardipine drip for     now.  We will implement possibly a different thiazide diuretic     versus calcium channel blocker when she stabilizes upstairs and I     will wean off labetalol.  She will be on labetalol IV q. 4 p.r.n.     for blood pressures 160/100. 2. History of possible lower GI bleeding from hemorrhoids - To follow     up with Gastroenterology Dr. Marina Goodell and Dr. Juanda Chance as an outpatient.     We will check labs in the morning. 3. Trivial coronary artery disease per above echo.  We will hold off     on echocardiogram despite the fact that she has cardiomegaly per x-     ray.  I would suspect that she had some element of CHF but is     currently euvolemic and very very stable without shortness of     breath or decompensation.  We will assign NYHA class once she     stabilizes and stage and determine further course from there.  I     would hold on consulting Cardiology at present time but certainly     she may need  follow up per Dr. Posey Rea who knows her better. 4. Chronic back pain and multiple somatic pains inclusive of headache     - I do not believe that she has a subarachnoid hemorrhage versus     any other type of hemorrhage given negative imaging.  I think she     has a lot of musculoskeletal issues related to the type of job that     she does and certainly she would benefit from continued on     amitriptyline 25 that she is on daily from Dr. Posey Rea. I will     give her low-dose opiates Percocet over the hospital course and     review.  We will not cycle cardiac enzymes given the fact that she does not have any chest pain at present time and the patient will be observed on [facility] Team V.  I will update her daughter as soon as possible and give her idea as to the plan of care.  I spent1 hour time inclusive of the consultation time as over 50% of this face-to-face and coordinating her care as well as updating family.          ______________________________ Pleas Koch, MD     JS/MEDQ  D:  05/08/2011  T:  05/08/2011  Job:  [id number]  cc:   Georgina Quint. Plotnikov, MD  Electronically Signed by Pleas Koch MD on 05/15/2011 07:50:05 AM

## 2011-05-15 NOTE — Assessment & Plan Note (Signed)
Start Norvasc 

## 2011-05-15 NOTE — Assessment & Plan Note (Signed)
Resolved

## 2011-05-15 NOTE — Assessment & Plan Note (Addendum)
Card f/u. Hosp has instructed her to stop ASA.

## 2011-05-15 NOTE — Assessment & Plan Note (Signed)
See Meds ECHO reviewed

## 2011-05-15 NOTE — Telephone Encounter (Signed)
Pt informed

## 2011-05-15 NOTE — Telephone Encounter (Signed)
Spoke with patient who was advised to call back and give medication information from hospital visit. Per pt she was given amlodipine 5 mg and HCTZ  25 mg. Patient would like to know if she she should continue these meds or start the triamterene. Please advise  FYI... Patient recently filled rx for these

## 2011-05-15 NOTE — Discharge Summary (Signed)
NAMEGRACIN, SOOHOO NO.:  [id number]  MEDICAL RECORD NO.:  [id number]  LOCATION:  5523                         FACILITY:  [facility]  PHYSICIAN:  Pleas Koch, MD        DATE OF BIRTH:  [birth year]  DATE OF ADMISSION:  05/08/2011 DATE OF DISCHARGE:  05/10/2011                              DISCHARGE SUMMARY   DISCHARGE DIAGNOSES: 1. Accelerated hypertension without evidence of end-organ dysfunction. 2. Hypokalemia, this is actually chronic on review of clinic charts. 3. Obesity, BMI of 29. 4. Cardiomegaly on chest x-ray, echo pending. 5. Musculoskeletal pain - recommended no NSAIDs.  DISCHARGE MEDICATIONS:  Are as follows, 1. Benazepril 1 tab daily. 2. Amitriptyline 25 mg 1 tab daily p.r.n. 3. Aspirin 81 mg 1 tab daily.  I have discontinued her Motrin.  I have     discontinued her triamterene/hydrochlorothiazide. 4. Calcium carbonate over-the-counter 1 tab daily. 5. Potassium chloride 20 mEq 1 tablet daily.  New medication: 1. Hydrochlorothiazide 25 mg 1 tab daily. 2. The patient to continue vitamin C 500 mg 1 tablet daily. 3. Nexium with vitamin D3 1000 units 1 tablet daily.  Please note, I have discontinued her aspirin.  PERTINENT IMAGING DATA:  Chest x-ray two-view done on May 08, 2011, showed cardiomegaly, no active lung disease.  CT head same date showed, no evidence of brain mass, brain hemorrhage, or acute infarction, subsequent bilateral basal ganglia calcifications seen.  Nonaneurysmal arterial calcification seen subsequent, no acute or active brain process seen.  No skull lesion evident.  No sinusitis evident.  No significant change from previous study.  Chest x-ray two-view on May 09, 2011 showed cardiomegaly, no acute abnormalities.  Echocardiogram done on May 09, 2011, is still pending, Dr. Jacinto Halim is the reading physician.  BRIEF HISTORY AND PHYSICAL:  This is a very pleasant 66 year old [place] American female came in with symptoms of  nausea and malaise overnight stay.  She has had back pain of 1-2 weeks and later developed malaise, did not actually vomit.  She has had neck pain this morning and at her job at [employer] found to have blood pressure of 181/107, this was then noted on repeat to be as high as 268/138 and this prompted then a call from her place of employment to the EMS who brought her out and it was noted blood pressure dropped on its own, but it was still high while in the emergency room.  While in emergency room, her headache was 7 out of 10, she was given Imdur and she was placed on Nitropaste and ED physician was about to place on nicardipine drip, however we held on this and gave her Norvasc stat as well as labetalol which dropped her pressure.  Pertinent admission vitals as above.  Urinalysis negative.  Troponins negative.  INR negative.  BUN and creatinine 8 and 0.65.  His platelet count 218 and hemoglobin 13.4.  It is also noted on prior ED visit on Jan 31, 2011, that she had small bright red blood per rectum and was supposed to followup with Dr. Juanda Chance and she has not.  At that time, however Dr. Juanda Chance recommended stopping  aspirin.  She also is to follow up for fibroid masses.  HOSPITAL COURSE: 1. Accelerated hypertension.  This was likely secondary to her     noncompliance of medications and she states she does not take her     medication at all.  As such, I am discharging her home on her     benazepril 40 as well as her new dose of hydrochlorothiazide 25 and     I will add amlodipine 5 today as her blood pressure is still     slightly elevated.  The patient was given dietary education and     instruction on salt limitations in diet and should adhere to the     same.  She was also instructed on weight loss, which will help     this. 2. Hypokalemia.  This is chronic and I am not sure if this needs     workup per Dr. Posey Rea who knows her better.  Review of his      clinic notes document that this has been persistent since 2010. 3. Cardiomegaly.  It looks like the patient has had cardiomegaly x2 on     chest x-ray, as such I did get an echocardiogram to delineate the     type of heart failure that she has.  I have explained the risks and     benefits carefully of staying versus going home and the patient     elects to go home and followup with cardiologist as an outpatient.     Dr. Jacinto Halim has read the echo and I have recommended that she call     him, I have provided his telephone number for the same and she     should follow up with him in 1-2 weeks to review the findings of     the echo as well as further medication management as the patient     may likely benefit from a beta-blocker.  I have done a detailed     explanation regarding types of heart failure as best as I can at     [facility] term regarding diastolic versus systolic dysfunction and     she understood as best as she could. 4. Musculoskeletal pain with headaches.  She had MRIs and she had CT     scans as above that did not show any further issue and I believe     that the headache was all from a high blood pressure.  I would recommend holding off on NSAIDs given her possible CHF diagnosis and this can be reviewed as an outpatient.  She can continue her amitriptyline.  The patient was seen on day of discharge and was doing well.  She had no new other significant complaints other than a mild headache this morning.  Her blood pressures were slightly elevated this morning, but resolved after she was given her regular dosages of medications.  The patient was deemed fit and stable for discharge and wanted to go home and was discharged home.  PHYSICAL EXAMINATION:  VITAL SIGNS:  On day of discharge as follows; temperature 98.5, pulse 76, respirations 18, blood pressure 146/88, and sats 98% on room air.  The patient was discharged in stable state. CHEST:  Clinically clear. ABDOMEN:  Soft,  nontender, nondistended. EXTREMITIES:  No lower extremity edema.  No nausea, no vomiting, no other issues.          ______________________________ Pleas Koch, MD     JS/MEDQ  D:  05/10/2011  T:  05/10/2011  Job:  [id number]  cc:   Pamella Pert, MD Georgina Quint Plotnikov, MD  Electronically Signed by Pleas Koch MD on 05/15/2011 07:50:28 AM

## 2011-06-10 ENCOUNTER — Other Ambulatory Visit: Payer: Self-pay | Admitting: *Deleted

## 2011-06-10 MED ORDER — TRIAMCINOLONE ACETONIDE 0.5 % EX CREA: TOPICAL_CREAM | Freq: Two times a day (BID) | CUTANEOUS | Status: DC | PRN

## 2011-07-09 ENCOUNTER — Ambulatory Visit (INDEPENDENT_AMBULATORY_CARE_PROVIDER_SITE_OTHER)
Admission: RE | Admit: 2011-07-09 | Discharge: 2011-07-09 | Disposition: A | Discharge status: Home / Self Care | Payer: BC Managed Care – PPO | Source: Ambulatory Visit | Attending: Internal Medicine | Admitting: Internal Medicine

## 2011-07-09 ENCOUNTER — Encounter: Payer: Self-pay | Admitting: Internal Medicine

## 2011-07-09 ENCOUNTER — Ambulatory Visit (INDEPENDENT_AMBULATORY_CARE_PROVIDER_SITE_OTHER): Payer: BC Managed Care – PPO

## 2011-07-09 ENCOUNTER — Ambulatory Visit (INDEPENDENT_AMBULATORY_CARE_PROVIDER_SITE_OTHER): Payer: BC Managed Care – PPO | Admitting: Internal Medicine

## 2011-07-09 VITALS — BP 120/82 | HR 76 | Temp 98.9°F | Resp 16 | Wt 169.0 lb

## 2011-07-09 DIAGNOSIS — M79609 Pain in unspecified limb: Secondary | ICD-10-CM

## 2011-07-09 DIAGNOSIS — M79605 Pain in left leg: Secondary | ICD-10-CM | POA: Insufficient documentation

## 2011-07-09 LAB — CBC WITH DIFFERENTIAL/PLATELET
Basophils Absolute: 0 10*3/uL (ref 0.0–0.1)
Eosinophils Absolute: 0.1 10*3/uL (ref 0.0–0.7)
Hemoglobin: 12.1 g/dL (ref 12.0–15.0)
Lymphocytes Relative: 32.1 % (ref 12.0–46.0)
Lymphs Abs: 1 10*3/uL (ref 0.7–4.0)
MCHC: 33.5 g/dL (ref 30.0–36.0)
Neutro Abs: 1.8 10*3/uL (ref 1.4–7.7)
Platelets: 230 10*3/uL (ref 150.0–400.0)
RDW: 13.1 % (ref 11.5–14.6)

## 2011-07-09 LAB — URINALYSIS
Bilirubin Urine: NEGATIVE
Ketones, ur: NEGATIVE
Leukocytes, UA: NEGATIVE
Urine Glucose: NEGATIVE
Urobilinogen, UA: 0.2 (ref 0.0–1.0)

## 2011-07-09 MED ORDER — TRAMADOL HCL 50 MG PO TABS: 50.0000 mg | ORAL_TABLET | Freq: Two times a day (BID) | ORAL | Status: DC | PRN

## 2011-07-09 MED ORDER — PREDNISONE 10 MG PO TABS: ORAL_TABLET | ORAL | Status: AC

## 2011-07-09 NOTE — Patient Instructions (Signed)
Stretch low back and IT band Heating pad, massage

## 2011-07-09 NOTE — Progress Notes (Signed)
  Subjective:    Patient ID: Kelly Santos, female    DOB: Nov 23, 1945, 65 y.o.   MRN: [id number]  HPI  C/o L LE pain in the calf and in the back of L knee; poss a little swelling. No travel prior to pain; no injury. Her son had a DVT/PE twice  Review of Systems  Constitutional: Negative for chills, activity change, appetite change, fatigue and unexpected weight change.  HENT: Negative for congestion, mouth sores and sinus pressure.   Eyes: Negative for visual disturbance.  Respiratory: Negative for cough and chest tightness.   Gastrointestinal: Negative for nausea and abdominal pain.  Genitourinary: Negative for frequency, difficulty urinating and vaginal pain.  Musculoskeletal: Positive for myalgias (L leg) and gait problem. Negative for back pain.  Skin: Negative for pallor and rash.  Neurological: Negative for dizziness, tremors, weakness, numbness and headaches.  Psychiatric/Behavioral: Negative for confusion and sleep disturbance.       Objective:   Physical Exam  Constitutional: She appears well-developed and well-nourished. No distress.  HENT:  Head: Normocephalic.  Right Ear: External ear normal.  Left Ear: External ear normal.  Nose: Nose normal.  Mouth/Throat: Oropharynx is clear and moist.  Eyes: Conjunctivae are normal. Pupils are equal, round, and reactive to light. Right eye exhibits no discharge. Left eye exhibits no discharge.  Neck: Normal range of motion. Neck supple. No JVD present. No tracheal deviation present. No thyromegaly present.  Cardiovascular: Normal rate, regular rhythm and normal heart sounds.   Pulmonary/Chest: No stridor. No respiratory distress. She has no wheezes.  Abdominal: Soft. Bowel sounds are normal. She exhibits no distension and no mass. There is no tenderness. There is no rebound and no guarding.  Musculoskeletal: She exhibits no edema and no tenderness.  Lymphadenopathy:    She has no cervical adenopathy.  Neurological: She displays  normal reflexes. No cranial nerve deficit. She exhibits normal muscle tone. Coordination normal.  Skin: No rash noted. No erythema.  Psychiatric: She has a normal mood and affect. Her behavior is normal. Judgment and thought content normal.    Procedure Note :    Procedure :   Point of care (POC) sonography examination   Indication: L LE pain and swelling; r/o DVT   Equipment used: Sonosite M-Turbo with HFL38x/13-6 MHz transducer linear probe. The images were stored in the unit and later transferred in storage.  The patient was placed in a decubitus position. Compression of femoral and popliteal veins was normal B.   This study revealed 1 LN in each groin   Impression: No DVT in LEs         Assessment & Plan:

## 2011-07-09 NOTE — Assessment & Plan Note (Signed)
X ray

## 2011-07-10 ENCOUNTER — Telehealth: Payer: Self-pay | Admitting: Internal Medicine

## 2011-07-10 ENCOUNTER — Encounter: Payer: Self-pay | Admitting: Internal Medicine

## 2011-07-10 LAB — BASIC METABOLIC PANEL
BUN: 14 mg/dL (ref 6–23)
Creatinine, Ser: 0.7 mg/dL (ref 0.4–1.2)
GFR: 101.26 mL/min (ref >60.00)
Glucose, Bld: 98 mg/dL (ref 70–99)
Potassium: 3.7 mEq/L (ref 3.5–5.1)

## 2011-07-10 LAB — D-DIMER, QUANTITATIVE: D-Dimer, Quant: 0.35 ug/mL-FEU (ref 0.00–0.48)

## 2011-07-10 NOTE — Telephone Encounter (Signed)
Kelly Santos, please, inform patient that all labs are normal thx

## 2011-07-11 NOTE — Telephone Encounter (Signed)
3 wks if not better; 3 mo if ok Thx

## 2011-07-11 NOTE — Telephone Encounter (Signed)
Left detailed mess informing pt of this.

## 2011-07-11 NOTE — Telephone Encounter (Signed)
Pt informed.   Pt thinks you said to her "f/u in 3 months, but her papers said 3 wks" please advise when does she need to follow up?

## 2011-08-19 ENCOUNTER — Ambulatory Visit: Payer: BC Managed Care – PPO | Admitting: Internal Medicine

## 2011-09-27 ENCOUNTER — Other Ambulatory Visit: Payer: Self-pay | Admitting: *Deleted

## 2011-09-27 MED ORDER — TRIAMTERENE-HCTZ 37.5-25 MG PO TABS: 1.0000 | ORAL_TABLET | Freq: Every day | ORAL | Status: DC

## 2011-10-15 ENCOUNTER — Other Ambulatory Visit: Payer: Self-pay | Admitting: *Deleted

## 2011-10-15 MED ORDER — POTASSIUM CHLORIDE CRYS ER 20 MEQ PO TBCR: 20.0000 meq | EXTENDED_RELEASE_TABLET | Freq: Two times a day (BID) | ORAL | Status: DC

## 2011-12-06 ENCOUNTER — Ambulatory Visit (INDEPENDENT_AMBULATORY_CARE_PROVIDER_SITE_OTHER): Payer: BC Managed Care – PPO | Admitting: Internal Medicine

## 2011-12-06 ENCOUNTER — Encounter: Payer: Self-pay | Admitting: Internal Medicine

## 2011-12-06 VITALS — BP 112/70 | HR 74 | Temp 98.6°F | Ht 66.0 in | Wt 172.8 lb

## 2011-12-06 DIAGNOSIS — J019 Acute sinusitis, unspecified: Secondary | ICD-10-CM

## 2011-12-06 DIAGNOSIS — R51 Headache: Secondary | ICD-10-CM

## 2011-12-06 MED ORDER — AMOXICILLIN-POT CLAVULANATE 875-125 MG PO TABS: 1.0000 | ORAL_TABLET | Freq: Two times a day (BID) | ORAL | Status: AC

## 2011-12-06 MED ORDER — IBUPROFEN 600 MG PO TABS: 600.0000 mg | ORAL_TABLET | Freq: Three times a day (TID) | ORAL | Status: DC | PRN

## 2011-12-06 MED ORDER — TRAMADOL HCL 50 MG PO TABS: 50.0000 mg | ORAL_TABLET | Freq: Two times a day (BID) | ORAL | Status: AC | PRN

## 2011-12-06 NOTE — Progress Notes (Signed)
  Subjective:    HPI  complains of headache and sinus symptoms  Onset >2 week ago, wax/wane symptoms  Describes mild trauma to right temple when she "bumped" head 11/20/11- no bruising, bleeding or loss of consciousness. No vision change, tinnitus or dizziness Then dental work done 2 days later - increase in soreness across right side of head since that time Reevaluation by dentist verifies no teeth problems Current symptoms associated with rhinorrhea, sneezing, and mild headache  Also frontal>maxillary sinus pressure and mild-mod chest congestion No relief with OTC meds    Past Medical History  Diagnosis Date  . Hypertension   . Hyperthyroidism   . Hemorrhoids   . Hx of colonic polyps   . Anemia   . GERD (gastroesophageal reflux disease)   . CAD (coronary artery disease)   . Diverticulosis   . Osteoarthritis   . Vitamin d deficiency   . Uterine polyp   . Generalized headaches   . Hiatal hernia     On CT done Jan 31, 2011     Review of Systems Constitutional: No night sweats, no unexpected weight change Pulmonary: No pleurisy or hemoptysis Cardiovascular: No chest pain or palpitations     Objective:   Physical Exam BP 112/70  Pulse 74  Temp(Src) 98.6 F (37 C) (Oral)  Ht 5\' 6"  (1.676 m)  Wt 172 lb 12.8 oz (78.382 kg)  BMI 27.89 kg/m2  SpO2 98% GEN: mildly ill appearing and audible head/chest congestion HENT: NCAT, no bruising or laceration to right temporal area-nontender scalp, mild sinus tenderness over right frontal and maxillary region, nares with yellow discharge, oropharynx mild erythema, no exudate Eyes: Vision grossly intact, no conjunctivitis Lungs: Clear to auscultation without rhonchi or wheeze, no increased work of breathing Cardiovascular: Regular rate and rhythm, no bilateral edema Neurologic: Awake alert oriented x4, cranial nerves II through XII symmetrically intact. Normal balance and cognition. Speech fluent without dysarthria or aphasia. Gait  normal without assistance     Assessment & Plan:  Sinusitis, R maxillary and frontal region headache - related to above  Very mild head trauma greater than 2 weeks ago, no evidence of neurologic sequela Recent dental work 11/22/11 -   Empiric antibiotics prescribed due to symptom duration greater than 7 days headache pain control with NSAIDs and/or tramadol as needed - erx done Reviewed 05/2011 head ct - reassured pt of normalcy of scan and normal neuro exam today Also symptomatic care with Tylenol, hydration and rest -

## 2011-12-06 NOTE — Patient Instructions (Signed)
It was good to see you today. Augmentin antibiotics for sinus infection - also ibuprofen 600mg  and/or tramadol as needed for headache - Your prescription(s) have been submitted to your pharmacy. Please take as directed and contact our office if you believe you are having problem(s) with the medication(s). If continued to have discomfort despite this treatment, call to consider further testing as needed

## 2012-02-17 ENCOUNTER — Other Ambulatory Visit: Payer: Self-pay | Admitting: Obstetrics and Gynecology

## 2012-02-20 ENCOUNTER — Other Ambulatory Visit: Payer: Self-pay | Admitting: *Deleted

## 2012-02-20 MED ORDER — AMITRIPTYLINE HCL 25 MG PO TABS: 25.0000 mg | ORAL_TABLET | Freq: Every day | ORAL | Status: DC

## 2012-03-27 ENCOUNTER — Other Ambulatory Visit: Payer: Self-pay

## 2012-03-27 MED ORDER — TRIAMTERENE-HCTZ 37.5-25 MG PO TABS: 1.0000 | ORAL_TABLET | Freq: Every day | ORAL | Status: DC

## 2012-03-27 MED ORDER — BENAZEPRIL HCL 40 MG PO TABS: 40.0000 mg | ORAL_TABLET | Freq: Every day | ORAL | Status: DC

## 2012-03-27 MED ORDER — AMLODIPINE BESYLATE 5 MG PO TABS: 5.0000 mg | ORAL_TABLET | Freq: Every day | ORAL | Status: DC

## 2012-03-27 NOTE — Telephone Encounter (Signed)
Error

## 2012-04-14 ENCOUNTER — Encounter: Payer: Self-pay | Admitting: Internal Medicine

## 2012-04-14 ENCOUNTER — Ambulatory Visit (INDEPENDENT_AMBULATORY_CARE_PROVIDER_SITE_OTHER): Payer: BC Managed Care – PPO | Admitting: Internal Medicine

## 2012-04-14 ENCOUNTER — Ambulatory Visit (INDEPENDENT_AMBULATORY_CARE_PROVIDER_SITE_OTHER)
Admission: RE | Admit: 2012-04-14 | Discharge: 2012-04-14 | Disposition: A | Discharge status: Home / Self Care | Payer: BC Managed Care – PPO | Source: Ambulatory Visit | Attending: Internal Medicine | Admitting: Internal Medicine

## 2012-04-14 VITALS — BP 138/100 | HR 84 | Temp 98.2°F | Resp 16 | Wt 173.0 lb

## 2012-04-14 DIAGNOSIS — M79609 Pain in unspecified limb: Secondary | ICD-10-CM

## 2012-04-14 DIAGNOSIS — M79672 Pain in left foot: Secondary | ICD-10-CM | POA: Insufficient documentation

## 2012-04-14 DIAGNOSIS — L84 Corns and callosities: Secondary | ICD-10-CM

## 2012-04-14 MED ORDER — IBUPROFEN 600 MG PO TABS: 600.0000 mg | ORAL_TABLET | Freq: Three times a day (TID) | ORAL | Status: DC | PRN

## 2012-04-14 NOTE — Assessment & Plan Note (Signed)
See procedure 

## 2012-04-14 NOTE — Assessment & Plan Note (Signed)
L1st and mostly 4th toe pain 7/13 R/o fx -- foot X ray Rx for Ibuprofen Surg shoe given Ortho consult

## 2012-04-14 NOTE — Progress Notes (Signed)
  Subjective:    Patient ID: Kelly Santos, female    DOB: 08/01/1946, 66 y.o.   MRN: [id number]  HPI  C/o L big toe and mostly L 4th toe pain x 2 mo. She fell off a few steps prior. Walking and wearing shoes are making it worse...  Review of Systems  Constitutional: Negative for chills.  Musculoskeletal: Positive for joint swelling.  Neurological: Negative for dizziness and light-headedness.  Psychiatric/Behavioral: Negative for disturbed wake/sleep cycle. The patient is not nervous/anxious.        Objective:   Physical Exam  Constitutional: She is oriented to person, place, and time. She appears well-developed. No distress.  Musculoskeletal: She exhibits tenderness (L 4th toe is tender w/ROM and w/palpation). She exhibits no edema.  Neurological: She is alert and oriented to person, place, and time. She has normal reflexes. She displays normal reflexes. She exhibits normal muscle tone. Coordination normal.  Skin:       L lat 4th toe callus    Procedure: L 4th toe  foot callus paring/cutting  Indication:   foot callus, painful  Consent: verbal  Risks and benefits were explained to the patient. Skin was cleaned with alcohol. I removed a large callus carefully with a round blade. Skin remained intact. Pain is better.  Tolerated well. Complications: none.        Assessment & Plan:

## 2012-04-15 ENCOUNTER — Telehealth: Payer: Self-pay | Admitting: Internal Medicine

## 2012-04-15 NOTE — Telephone Encounter (Signed)
Left detailed mess informing pt of below.  

## 2012-04-15 NOTE — Telephone Encounter (Signed)
Kelly Santos, please, inform patient that her foot xray was ok - no fx. Keep ortho appt when scheduled Thx

## 2012-06-09 ENCOUNTER — Other Ambulatory Visit: Payer: Self-pay | Admitting: *Deleted

## 2012-06-09 MED ORDER — BENAZEPRIL HCL 40 MG PO TABS: 40.0000 mg | ORAL_TABLET | Freq: Every day | ORAL | Status: DC

## 2012-06-26 ENCOUNTER — Other Ambulatory Visit: Payer: Self-pay | Admitting: Internal Medicine

## 2012-07-06 ENCOUNTER — Other Ambulatory Visit: Payer: Self-pay | Admitting: Internal Medicine

## 2012-07-10 ENCOUNTER — Other Ambulatory Visit: Payer: Self-pay | Admitting: Internal Medicine

## 2012-07-10 DIAGNOSIS — Z0000 Encounter for general adult medical examination without abnormal findings: Secondary | ICD-10-CM

## 2012-08-03 ENCOUNTER — Ambulatory Visit (INDEPENDENT_AMBULATORY_CARE_PROVIDER_SITE_OTHER): Payer: BC Managed Care – PPO | Admitting: Internal Medicine

## 2012-08-03 ENCOUNTER — Encounter: Payer: Self-pay | Admitting: Internal Medicine

## 2012-08-03 ENCOUNTER — Ambulatory Visit (INDEPENDENT_AMBULATORY_CARE_PROVIDER_SITE_OTHER)
Admission: RE | Admit: 2012-08-03 | Discharge: 2012-08-03 | Disposition: A | Discharge status: Home / Self Care | Payer: BC Managed Care – PPO | Source: Ambulatory Visit | Attending: Internal Medicine | Admitting: Internal Medicine

## 2012-08-03 ENCOUNTER — Other Ambulatory Visit (INDEPENDENT_AMBULATORY_CARE_PROVIDER_SITE_OTHER): Payer: BC Managed Care – PPO

## 2012-08-03 VITALS — BP 140/100 | HR 76 | Temp 99.3°F | Resp 16 | Wt 174.0 lb

## 2012-08-03 DIAGNOSIS — I2510 Atherosclerotic heart disease of native coronary artery without angina pectoris: Secondary | ICD-10-CM

## 2012-08-03 DIAGNOSIS — M545 Low back pain, unspecified: Secondary | ICD-10-CM | POA: Insufficient documentation

## 2012-08-03 DIAGNOSIS — I10 Essential (primary) hypertension: Secondary | ICD-10-CM

## 2012-08-03 DIAGNOSIS — E559 Vitamin D deficiency, unspecified: Secondary | ICD-10-CM

## 2012-08-03 LAB — URINALYSIS
Bilirubin Urine: NEGATIVE
Nitrite: NEGATIVE
Total Protein, Urine: NEGATIVE

## 2012-08-03 MED ORDER — IBUPROFEN 600 MG PO TABS: 600.0000 mg | ORAL_TABLET | Freq: Three times a day (TID) | ORAL | Status: DC | PRN

## 2012-08-03 MED ORDER — CYCLOBENZAPRINE HCL 5 MG PO TABS: 5.0000 mg | ORAL_TABLET | Freq: Two times a day (BID) | ORAL | Status: DC | PRN

## 2012-08-03 NOTE — Assessment & Plan Note (Signed)
Continue with current prescription therapy as reflected on the Med list.  

## 2012-08-03 NOTE — Progress Notes (Signed)
   Subjective:     Back Pain This is a chronic problem. The current episode started 1 to 4 weeks ago. The problem occurs intermittently. The problem has been gradually worsening since onset. The pain is present in the lumbar spine. The pain is at a severity of 6/10. The pain is moderate. The pain is worse during the day. The symptoms are aggravated by bending and standing. Pertinent negatives include no fever. She has tried analgesics for the symptoms. The treatment provided mild relief.      Review of Systems  Constitutional: Negative for fever and chills.  Musculoskeletal: Positive for back pain and joint swelling.  Neurological: Negative for dizziness and light-headedness.  Psychiatric/Behavioral: Negative for sleep disturbance. The patient is not nervous/anxious.        Objective:   Physical Exam  Constitutional: She is oriented to person, place, and time. She appears well-developed. No distress.  Cardiovascular: Normal rate.  Exam reveals no gallop.   Pulmonary/Chest: No respiratory distress. She has no wheezes. She exhibits tenderness.  Abdominal: She exhibits no distension and no mass. There is no tenderness.  Musculoskeletal: She exhibits tenderness (LS is tender). She exhibits no edema.  Neurological: She is alert and oriented to person, place, and time. She has normal reflexes. She exhibits normal muscle tone. Coordination normal.  Skin:       L lat 4th toe callus           Assessment & Plan:

## 2012-08-03 NOTE — Assessment & Plan Note (Signed)
Chronic  Worse in 11/13 [facility] LS Xray UA Ibuprofen Rx Flexeril Rx

## 2012-08-04 ENCOUNTER — Telehealth: Payer: Self-pay | Admitting: Internal Medicine

## 2012-08-04 NOTE — Telephone Encounter (Signed)
Kelly Santos, please, inform patient that her spine xray was normal except for a mld DJD Thx

## 2012-08-04 NOTE — Telephone Encounter (Signed)
Pt informed

## 2012-09-22 ENCOUNTER — Other Ambulatory Visit: Payer: Self-pay | Admitting: *Deleted

## 2012-09-22 MED ORDER — POTASSIUM CHLORIDE CRYS ER 20 MEQ PO TBCR: 20.0000 meq | EXTENDED_RELEASE_TABLET | Freq: Two times a day (BID) | ORAL | Status: DC

## 2012-09-28 ENCOUNTER — Other Ambulatory Visit: Payer: Self-pay | Admitting: *Deleted

## 2012-10-05 ENCOUNTER — Encounter: Payer: BC Managed Care – PPO | Admitting: Internal Medicine

## 2012-11-10 ENCOUNTER — Encounter: Payer: BC Managed Care – PPO | Admitting: Internal Medicine

## 2012-11-24 ENCOUNTER — Other Ambulatory Visit (INDEPENDENT_AMBULATORY_CARE_PROVIDER_SITE_OTHER): Payer: BC Managed Care – PPO

## 2012-11-24 DIAGNOSIS — Z0000 Encounter for general adult medical examination without abnormal findings: Secondary | ICD-10-CM

## 2012-11-24 LAB — LIPID PANEL
Cholesterol: 162 mg/dL (ref 0–200)
HDL: 47.2 mg/dL (ref >39.00)
Total CHOL/HDL Ratio: 3
Triglycerides: 64 mg/dL (ref 0.0–149.0)

## 2012-11-24 LAB — BASIC METABOLIC PANEL
CO2: 32 mEq/L (ref 19–32)
GFR: 99.28 mL/min (ref >60.00)
Glucose, Bld: 95 mg/dL (ref 70–99)
Potassium: 3.3 mEq/L — ABNORMAL LOW (ref 3.5–5.1)
Sodium: 139 mEq/L (ref 135–145)

## 2012-11-24 LAB — CBC WITH DIFFERENTIAL/PLATELET
Basophils Absolute: 0 10*3/uL (ref 0.0–0.1)
HCT: 37.8 % (ref 36.0–46.0)
Lymphs Abs: 1 10*3/uL (ref 0.7–4.0)
MCHC: 33.9 g/dL (ref 30.0–36.0)
MCV: 97.3 fl (ref 78.0–100.0)
Monocytes Absolute: 0.3 10*3/uL (ref 0.1–1.0)
Platelets: 245 10*3/uL (ref 150.0–400.0)
RDW: 12.7 % (ref 11.5–14.6)

## 2012-11-24 LAB — TSH: TSH: 0.6 u[IU]/mL (ref 0.35–5.50)

## 2012-11-24 LAB — URINALYSIS, ROUTINE W REFLEX MICROSCOPIC
Bilirubin Urine: NEGATIVE
Ketones, ur: NEGATIVE
Leukocytes, UA: NEGATIVE
pH: 7 (ref 5.0–8.0)

## 2012-11-24 LAB — HEPATIC FUNCTION PANEL
Albumin: 3.7 g/dL (ref 3.5–5.2)
Total Bilirubin: 0.6 mg/dL (ref 0.3–1.2)

## 2012-11-27 ENCOUNTER — Encounter: Payer: Self-pay | Admitting: Internal Medicine

## 2012-11-27 ENCOUNTER — Ambulatory Visit (INDEPENDENT_AMBULATORY_CARE_PROVIDER_SITE_OTHER): Payer: BC Managed Care – PPO | Admitting: Internal Medicine

## 2012-11-27 VITALS — BP 140/90 | HR 72 | Temp 98.2°F | Resp 16 | Ht 64.0 in | Wt 176.0 lb

## 2012-11-27 DIAGNOSIS — I10 Essential (primary) hypertension: Secondary | ICD-10-CM

## 2012-11-27 MED ORDER — AMLODIPINE BESY-BENAZEPRIL HCL 5-40 MG PO CAPS: 1.0000 | ORAL_CAPSULE | Freq: Every day | ORAL | Status: DC

## 2012-11-27 MED ORDER — SPIRONOLACTONE 50 MG PO TABS: 50.0000 mg | ORAL_TABLET | Freq: Every day | ORAL | Status: DC

## 2012-11-27 NOTE — Assessment & Plan Note (Addendum)
We discussed age appropriate health related issues, including available/recomended screening tests and vaccinations. We discussed a need for adhering to healthy diet and exercise. Labs/EKG were reviewed/ordered. All questions were answered. declines a flu shot zostavax was adviced

## 2012-11-27 NOTE — Assessment & Plan Note (Signed)
BP Readings from Last 3 Encounters:  11/27/12 170/94  08/03/12 140/100  04/14/12 138/100

## 2012-11-27 NOTE — Assessment & Plan Note (Signed)
Dr Marina Goodell -- colon 2010 Due 2015

## 2012-11-27 NOTE — Assessment & Plan Note (Signed)
Change BP meds Add KCl

## 2012-11-27 NOTE — Assessment & Plan Note (Signed)
Continue with current prescription therapy as reflected on the Med list.  

## 2012-11-27 NOTE — Progress Notes (Signed)
  Subjective:    Patient ID: Kelly Santos, female    DOB: 02/26/1946, 67 y.o.   MRN: [id number]  HPI  The patient is here for a wellness exam. The patient has been doing well overall without major physical or psychological issues going on lately. The patient needs to address  chronic hypertension, cataracts  BP Readings from Last 3 Encounters:  11/27/12 170/94  08/03/12 140/100  04/14/12 138/100   Wt Readings from Last 3 Encounters:  11/27/12 176 lb (79.833 kg)  08/03/12 174 lb (78.926 kg)  04/14/12 173 lb (78.472 kg)      Review of Systems  Constitutional: Negative for fever, chills, diaphoresis, activity change, appetite change, fatigue and unexpected weight change.  HENT: Negative for hearing loss, ear pain, congestion, sore throat, sneezing, mouth sores, neck pain, dental problem, voice change, postnasal drip and sinus pressure.   Eyes: Negative for pain and visual disturbance.  Respiratory: Negative for cough, chest tightness, wheezing and stridor.   Cardiovascular: Negative for chest pain, palpitations and leg swelling.  Gastrointestinal: Negative for nausea, vomiting, abdominal pain, blood in stool, abdominal distention and rectal pain.  Genitourinary: Negative for dysuria, hematuria, decreased urine volume, vaginal bleeding, vaginal discharge, difficulty urinating, vaginal pain and menstrual problem.  Musculoskeletal: Negative for back pain, joint swelling and gait problem.  Skin: Negative for color change, rash and wound.  Neurological: Negative for dizziness, tremors, syncope, speech difficulty and light-headedness.  Hematological: Negative for adenopathy.  Psychiatric/Behavioral: Negative for suicidal ideas, hallucinations, behavioral problems, confusion, sleep disturbance, dysphoric mood and decreased concentration. The patient is not hyperactive.        Objective:   Physical Exam  Constitutional: She appears well-developed. No distress.  HENT:  Head:  Normocephalic.  Right Ear: External ear normal.  Left Ear: External ear normal.  Nose: Nose normal.  Mouth/Throat: Oropharynx is clear and moist.  Eyes: Conjunctivae are normal. Pupils are equal, round, and reactive to light. Right eye exhibits no discharge. Left eye exhibits no discharge.  Neck: Normal range of motion. Neck supple. No JVD present. No tracheal deviation present. No thyromegaly present.  Cardiovascular: Normal rate, regular rhythm and normal heart sounds.   Pulmonary/Chest: No stridor. No respiratory distress. She has no wheezes.  Abdominal: Soft. Bowel sounds are normal. She exhibits no distension and no mass. There is no tenderness. There is no rebound and no guarding.  Musculoskeletal: She exhibits no edema and no tenderness.  Lymphadenopathy:    She has no cervical adenopathy.  Neurological: She displays normal reflexes. No cranial nerve deficit. She exhibits normal muscle tone. Coordination normal.  Skin: No rash noted. No erythema.  Psychiatric: She has a normal mood and affect. Her behavior is normal. Judgment and thought content normal.   Lab Results  Component Value Date   WBC 3.2* 11/24/2012   HGB 12.8 11/24/2012   HCT 37.8 11/24/2012   PLT 245.0 11/24/2012   GLUCOSE 95 11/24/2012   CHOL 162 11/24/2012   TRIG 64.0 11/24/2012   HDL 47.20 11/24/2012   LDLCALC 102* 11/24/2012   ALT 22 11/24/2012   AST 21 11/24/2012   NA 139 11/24/2012   K 3.3* 11/24/2012   CL 103 11/24/2012   CREATININE 0.8 11/24/2012   BUN 9 11/24/2012   CO2 32 11/24/2012   TSH 0.60 11/24/2012   INR 0.96 05/09/2011          Assessment & Plan:

## 2012-11-27 NOTE — Patient Instructions (Signed)
Finish Potassium pills Take spironolactone in place of Triamt/HCTZ Take Benazepril/amlodipine in place of Amlodipine and Benazepril

## 2012-12-27 ENCOUNTER — Encounter (HOSPITAL_COMMUNITY): Payer: Self-pay | Admitting: *Deleted

## 2012-12-27 ENCOUNTER — Emergency Department (HOSPITAL_COMMUNITY)
Admission: EM | Admit: 2012-12-27 | Discharge: 2012-12-27 | Disposition: A | Discharge status: Home / Self Care | Payer: BC Managed Care – PPO | Attending: Emergency Medicine | Admitting: Emergency Medicine

## 2012-12-27 ENCOUNTER — Emergency Department (HOSPITAL_COMMUNITY): Payer: BC Managed Care – PPO

## 2012-12-27 DIAGNOSIS — Z8719 Personal history of other diseases of the digestive system: Secondary | ICD-10-CM | POA: Insufficient documentation

## 2012-12-27 DIAGNOSIS — Z860100 Personal history of colon polyps, unspecified: Secondary | ICD-10-CM | POA: Insufficient documentation

## 2012-12-27 DIAGNOSIS — I10 Essential (primary) hypertension: Secondary | ICD-10-CM | POA: Insufficient documentation

## 2012-12-27 DIAGNOSIS — Z8639 Personal history of other endocrine, nutritional and metabolic disease: Secondary | ICD-10-CM | POA: Insufficient documentation

## 2012-12-27 DIAGNOSIS — I2510 Atherosclerotic heart disease of native coronary artery without angina pectoris: Secondary | ICD-10-CM | POA: Insufficient documentation

## 2012-12-27 DIAGNOSIS — Z8739 Personal history of other diseases of the musculoskeletal system and connective tissue: Secondary | ICD-10-CM | POA: Insufficient documentation

## 2012-12-27 DIAGNOSIS — N950 Postmenopausal bleeding: Secondary | ICD-10-CM

## 2012-12-27 DIAGNOSIS — Z79899 Other long term (current) drug therapy: Secondary | ICD-10-CM | POA: Insufficient documentation

## 2012-12-27 DIAGNOSIS — Z8742 Personal history of other diseases of the female genital tract: Secondary | ICD-10-CM | POA: Insufficient documentation

## 2012-12-27 DIAGNOSIS — Z862 Personal history of diseases of the blood and blood-forming organs and certain disorders involving the immune mechanism: Secondary | ICD-10-CM | POA: Insufficient documentation

## 2012-12-27 DIAGNOSIS — M549 Dorsalgia, unspecified: Secondary | ICD-10-CM

## 2012-12-27 DIAGNOSIS — E559 Vitamin D deficiency, unspecified: Secondary | ICD-10-CM | POA: Insufficient documentation

## 2012-12-27 LAB — CBC WITH DIFFERENTIAL/PLATELET
Basophils Absolute: 0 K/uL (ref 0.0–0.1)
Basophils Relative: 1 % (ref 0–1)
Eosinophils Absolute: 0.1 K/uL (ref 0.0–0.7)
Eosinophils Relative: 3 % (ref 0–5)
HCT: 32.6 % — ABNORMAL LOW (ref 36.0–46.0)
Hemoglobin: 11.2 g/dL — ABNORMAL LOW (ref 12.0–15.0)
Lymphocytes Relative: 31 % (ref 12–46)
Lymphs Abs: 0.9 K/uL (ref 0.7–4.0)
MCH: 33.3 pg (ref 26.0–34.0)
MCHC: 34.4 g/dL (ref 30.0–36.0)
MCV: 97 fL (ref 78.0–100.0)
Monocytes Absolute: 0.4 K/uL (ref 0.1–1.0)
Monocytes Relative: 14 % — ABNORMAL HIGH (ref 3–12)
Neutro Abs: 1.4 K/uL — ABNORMAL LOW (ref 1.7–7.7)
Neutrophils Relative %: 52 % (ref 43–77)
Platelets: 218 K/uL (ref 150–400)
RBC: 3.36 MIL/uL — ABNORMAL LOW (ref 3.87–5.11)
RDW: 13.2 % (ref 11.5–15.5)
WBC: 2.8 K/uL — ABNORMAL LOW (ref 4.0–10.5)

## 2012-12-27 LAB — BASIC METABOLIC PANEL
CO2: 24 mEq/L (ref 19–32)
Calcium: 9.5 mg/dL (ref 8.4–10.5)
Glucose, Bld: 100 mg/dL — ABNORMAL HIGH (ref 70–99)
Sodium: 138 mEq/L (ref 135–145)

## 2012-12-27 MED ORDER — DIAZEPAM 5 MG/ML IJ SOLN: 5.0000 mg | Freq: Once | INTRAMUSCULAR | Status: DC

## 2012-12-27 MED ORDER — DIAZEPAM 5 MG/ML IJ SOLN
5.0000 mg | Freq: Once | INTRAMUSCULAR | Status: AC
  Administered 2012-12-27: 5 mg via INTRAMUSCULAR
  Filled 2012-12-27: qty 2

## 2012-12-27 MED ORDER — CYCLOBENZAPRINE HCL 5 MG PO TABS: 5.0000 mg | ORAL_TABLET | Freq: Three times a day (TID) | ORAL | Status: DC | PRN

## 2012-12-27 MED ORDER — KETOROLAC TROMETHAMINE 60 MG/2ML IM SOLN
60.0000 mg | Freq: Once | INTRAMUSCULAR | Status: AC
  Administered 2012-12-27: 60 mg via INTRAMUSCULAR
  Filled 2012-12-27: qty 2

## 2012-12-27 MED ORDER — TRAMADOL HCL 50 MG PO TABS: 100.0000 mg | ORAL_TABLET | Freq: Four times a day (QID) | ORAL | Status: DC | PRN

## 2012-12-27 NOTE — ED Notes (Signed)
Pt to xray

## 2012-12-27 NOTE — ED Notes (Signed)
Pt reports lower back pain and heavy vaginal bleeding x1 week. Reports she has not had a menstrual cycle for years. Pt has appt with ob gyn on Monday.  md at bedside Pt alert and oriented x4. Respirations even and unlabored, bilateral symmetrical rise and fall of chest. Skin warm and dry. In no acute distress. Denies needs.

## 2012-12-27 NOTE — ED Notes (Signed)
Pt back from x-ray.

## 2012-12-27 NOTE — ED Provider Notes (Signed)
History     CSN: [id number]  Arrival date & time 12/27/12  0903   First MD Initiated Contact with Patient 12/27/12 0913      Chief Complaint  Patient presents with  . Vaginal Bleeding  . Back Pain    (Consider location/radiation/quality/duration/timing/severity/associated sxs/prior treatment) HPI  Patient reports she started having some lower back pain a couple weeks ago. She states she's had it before it used to gets better with Motrin. However she states she's been taking Motrin without improvement. She states it started getting worse on the 25th. She states it hurts if she changes positions or if she moves certain ways or she sits prolonged there is a time. She states she gets a little catch if she moves in certain ways that is a very sharp pain she also reports she started having vaginal bleeding about a week ago. She states the bleeding is heavier than a normal period. She states her last period was over 10 years ago. She has contacted her GYN and has been taking progesterone which she states has improved the bleeding. She states she had light bleeding yesterday and today she is having spotting. She has an appointment tomorrow to see her GYN and to get an ultrasound done in the office. She denies any abdominal pain. She denies feeling weak or dizzy. She does state on February 22 she was started on a new blood pressure pill which appears to be Lotrel. She is concerned that that may be causing some of her muscle spasm in her back. She also thinks that maybe causing her vaginal bleeding.  PCP Dr Posey Rea GYN Dr Thana Ates  Past Medical History  Diagnosis Date  . Hypertension   . Hyperthyroidism   . Hemorrhoids   . Hx of colonic polyps   . Anemia   . GERD (gastroesophageal reflux disease)   . CAD (coronary artery disease)   . Diverticulosis   . Osteoarthritis   . Vitamin D deficiency   . Uterine polyp   . Generalized headaches   . Hiatal hernia     On CT done Jan 31, 2011      Past Surgical History  Procedure Laterality Date  . Dilation and curettage of uterus      Family History  Problem Relation Age of Onset  . Colon cancer Neg Hx   . Hypertension Mother   . Hypertension Father   . Deep vein thrombosis Son 35    DVT x 2    History  Substance Use Topics  . Smoking status: Never Smoker   . Smokeless tobacco: Never Used  . Alcohol Use: No  employed at [employer]  OB History   Grav Para Term Preterm Abortions TAB SAB Ect Mult Living                  Review of Systems  All other systems reviewed and are negative.    Allergies  Review of patient's allergies indicates no known allergies.  Home Medications   Current Outpatient Rx  Name  Route  Sig  Dispense  Refill  . amitriptyline (ELAVIL) 25 MG tablet   Oral   Take 25 mg by mouth at bedtime as needed for sleep or pain.         Marland Kitchen amLODipine-benazepril (LOTREL) 5-40 MG per capsule   Oral   Take 1 capsule by mouth daily before breakfast.         . cholecalciferol (VITAMIN D) 1000 UNITS tablet  Oral   Take 1,000 Units by mouth daily.         Marland Kitchen ibuprofen (ADVIL,MOTRIN) 600 MG tablet   Oral   Take 1 tablet (600 mg total) by mouth every 8 (eight) hours as needed. As needed   60 tablet   1   . potassium chloride SA (K-DUR,KLOR-CON) 20 MEQ tablet   Oral   Take 20 mEq by mouth daily.         Marland Kitchen spironolactone (ALDACTONE) 50 MG tablet   Oral   Take 50 mg by mouth daily before breakfast.         . vitamin C (ASCORBIC ACID) 500 MG tablet   Oral   Take 500 mg by mouth daily.         . cyclobenzaprine (FLEXERIL) 5 MG tablet   Oral   Take 1 tablet (5 mg total) by mouth 3 (three) times daily as needed for muscle spasms.   30 tablet   0   . traMADol (ULTRAM) 50 MG tablet   Oral   Take 2 tablets (100 mg total) by mouth every 6 (six) hours as needed for pain.   20 tablet   0   prgesterone 200 mg predforte ophthalmic drops combigan opthalmic drops   BP  153/97  Pulse 80  Temp(Src) 98.4 F (36.9 C) (Oral)  Resp 18  SpO2 100%  Vital signs normal    Physical Exam  Nursing note and vitals reviewed. Constitutional: She is oriented to person, place, and time. She appears well-developed and well-nourished.  Non-toxic appearance. She does not appear ill. No distress.  HENT:  Head: Normocephalic and atraumatic.  Right Ear: External ear normal.  Left Ear: External ear normal.  Nose: Nose normal. No mucosal edema or rhinorrhea.  Mouth/Throat: Oropharynx is clear and moist and mucous membranes are normal. No dental abscesses or edematous.  Eyes: Conjunctivae and EOM are normal. Pupils are equal, round, and reactive to light.  Neck: Normal range of motion and full passive range of motion without pain. Neck supple.  Cardiovascular: Normal rate, regular rhythm and normal heart sounds.  Exam reveals no gallop and no friction rub.   No murmur heard. Pulmonary/Chest: Effort normal and breath sounds normal. No respiratory distress. She has no wheezes. She has no rhonchi. She has no rales. She exhibits no tenderness and no crepitus.  Abdominal: Soft. Normal appearance and bowel sounds are normal. She exhibits no distension. There is no tenderness. There is no rebound and no guarding.  Musculoskeletal: Normal range of motion. She exhibits no edema and no tenderness.  Moves all extremities well.  Non pain to palpation in the thoracic, cervical or upper lumbar spine, tender in the lower lumbar spine and across the sacrum.   Neurological: She is alert and oriented to person, place, and time. She has normal strength. No cranial nerve deficit.  Skin: Skin is warm, dry and intact. No rash noted. No erythema. No pallor.  Psychiatric: She has a normal mood and affect. Her speech is normal and behavior is normal. Her mood appears not anxious.    ED Course  Procedures (including critical care time)  Medications  ketorolac (TORADOL) injection 60 mg (60 mg  Intramuscular Given 12/27/12 0942)  diazepam (VALIUM) injection 5 mg (5 mg Intramuscular Given 12/27/12 0942)   Pain is better. She is able to move now without flinching.   Review of her chart shows she has chronic low back pain off and on, she was last seen  by her PCP for same on Nov 4. 2013.   Pt's vaginal bleeding has already been addressed by her GYN and she has an appt tomorrow to have further evaluation.   Results for orders placed during the hospital encounter of 12/27/12  CBC WITH DIFFERENTIAL      Result Value Range   WBC 2.8 (*) 4.0 - 10.5 K/uL   RBC 3.36 (*) 3.87 - 5.11 MIL/uL   Hemoglobin 11.2 (*) 12.0 - 15.0 g/dL   HCT 16.1 (*) 09.6 - 04.5 %   MCV 97.0  78.0 - 100.0 fL   MCH 33.3  26.0 - 34.0 pg   MCHC 34.4  30.0 - 36.0 g/dL   RDW 40.9  81.1 - 91.4 %   Platelets 218  150 - 400 K/uL   Neutrophils Relative 52  43 - 77 %   Neutro Abs 1.4 (*) 1.7 - 7.7 K/uL   Lymphocytes Relative 31  12 - 46 %   Lymphs Abs 0.9  0.7 - 4.0 K/uL   Monocytes Relative 14 (*) 3 - 12 %   Monocytes Absolute 0.4  0.1 - 1.0 K/uL   Eosinophils Relative 3  0 - 5 %   Eosinophils Absolute 0.1  0.0 - 0.7 K/uL   Basophils Relative 1  0 - 1 %   Basophils Absolute 0.0  0.0 - 0.1 K/uL  BASIC METABOLIC PANEL      Result Value Range   Sodium 138  135 - 145 mEq/L   Potassium 3.9  3.5 - 5.1 mEq/L   Chloride 105  96 - 112 mEq/L   CO2 24  19 - 32 mEq/L   Glucose, Bld 100 (*) 70 - 99 mg/dL   BUN 11  6 - 23 mg/dL   Creatinine, Ser 7.82  0.50 - 1.10 mg/dL   Calcium 9.5  8.4 - 95.6 mg/dL   GFR calc non Af Amer >90  >90 mL/min   GFR calc Af Amer >90  >90 mL/min   Laboratory interpretation all normal except persistant total WBC without neutropenia, mild anemia, but not enough to consider transfusion    Dg Lumbar Spine Complete  12/27/2012  *RADIOLOGY REPORT*  Clinical Data: Back pain  LUMBAR SPINE - COMPLETE 4+ VIEW  Comparison: 08/03/2012  Findings: Mild endplate degenerative changes.  Normal alignment  without compression fracture, wedge shaped deformity or focal kyphosis.  No pars defects.  Normal pedicles and SI joints. Calcified pelvic venous phleboliths noted.  Nonobstructive bowel gas pattern.  IMPRESSION: Mild degenerative changes.  No acute finding or interval change.   Original Report Authenticated By: Judie Petit. Shick, M.D.      1. Back pain   2. Post-menopausal bleeding     New Prescriptions   CYCLOBENZAPRINE (FLEXERIL) 5 MG TABLET    Take 1 tablet (5 mg total) by mouth 3 (three) times daily as needed for muscle spasms.   TRAMADOL (ULTRAM) 50 MG TABLET    Take 2 tablets (100 mg total) by mouth every 6 (six) hours as needed for pain.    Plan discharge  Devoria Albe, MD, FACEP   MDM          Ward Givens, MD 12/27/12 1146

## 2012-12-28 ENCOUNTER — Telehealth: Payer: Self-pay | Admitting: Internal Medicine

## 2012-12-28 NOTE — Telephone Encounter (Signed)
Not very likely, however, it would be best to stop it. Pls stop Thx

## 2012-12-28 NOTE — Telephone Encounter (Signed)
Caller: Kenzie/Patient; Phone: [phone] Ext:[phone]; Reason for Call: Patient calling about spironolactone.  States began taking spironolactone 11/27/12, and has had vaginal bleeding and low back pain since 12/22/12.  Seen in ED at [facility] 12/27/12.  ED MD told her to follow up with/ask Dr.  Posey Rea if the spironolactone could be causing the vaginal bleeding.  States she is seeing her GYN today and is having an ultrasound.  Back xray showed some arthritis, but nothing significant.  Patient would like to reconsider whether she needs to change/stop the spironolactone.  Info to office for provider review/callback.  May reach patient at [phone] cell or home [phone].  Krs/can

## 2012-12-29 NOTE — Telephone Encounter (Signed)
Left message for pt to callback office.  

## 2012-12-30 NOTE — Telephone Encounter (Signed)
Patient Information:  Caller Name: Kelly Santos  Phone: [phone]  Patient: Kelly Santos, Kelly Santos  Gender: Female  DOB: March 23, 1946  Age: 67 Years  PCP: Plotnikov, Alex (Adults only)  Office Follow Up:  Does the office need to follow up with this patient?: Yes  Instructions For The Office: Please clarify with patient if she needs to continue taking the K+ since she was told to stop the spironolactone.  RN Note:  Patient has had problems with bleeding and back pain.  Initially she found that a side effect of one of her spironolactone could be related to the back pain and post menopausal bleeding.  Called Dr. Posey Rea who left a message that he doubted it but it would be better not to stop it.  Two messages left for her call back to get this information.  Today while exploring her concerns the RN informed her of the message from 12/28/12.  In the interim, patient has found out the causes of both of her concerns.  In addition she had leg cramps during the night and was concerned about it possibly being related to her K+.  Is currently take her BP combination pill, finishing up the K+ as instructed and the spironolactone. Please clarify if in stopping the spironolactone; does the patient need to continue taking the K+ or not.  Patient declined triage of cramps since she just had them one time.  Symptoms  Reason For Call & Symptoms: Recent history of vaginal bleeding post menopause and lower back pain.  Thought side effects of one of her BP medications could cause this but on investigation by her GYN provider the source of problems have been identified and are currently being treated.  Call today question regarding leg cramps, BP meds and K+ medication.  Reviewed Health History In EMR: Yes  Reviewed Medications In EMR: Yes  Reviewed Allergies In EMR: Yes  Reviewed Surgeries / Procedures: Yes  Date of Onset of Symptoms: 12/30/2012  Guideline(s) Used:  No Protocol Available - Information Only  Disposition Per  Guideline:   Discuss with PCP and Callback by Nurse Today  Reason For Disposition Reached:   Nursing judgment  Advice Given:  Call Back If:  You become worse.  Patient Will Follow Care Advice:  YES

## 2012-12-30 NOTE — Telephone Encounter (Signed)
Left message for pt to callback office.  

## 2012-12-31 NOTE — Telephone Encounter (Signed)
Left detailed mess informing pt of below.  

## 2012-12-31 NOTE — Telephone Encounter (Signed)
Continue KCl Thx

## 2012-12-31 NOTE — Telephone Encounter (Signed)
Pt informed to stop spironolactone. She now wants to know should she continue Kcl? Please advise.

## 2013-01-08 ENCOUNTER — Other Ambulatory Visit: Payer: Self-pay | Admitting: Internal Medicine

## 2013-01-18 ENCOUNTER — Other Ambulatory Visit: Payer: Self-pay | Admitting: Obstetrics and Gynecology

## 2013-01-18 HISTORY — PX: DILATION AND CURETTAGE OF UTERUS: SHX78

## 2013-01-19 ENCOUNTER — Telehealth: Payer: Self-pay | Admitting: Internal Medicine

## 2013-01-19 NOTE — Telephone Encounter (Signed)
[facility] on 01/13/2013  Faxed 13 pages to Fax # [phone] Office Note, Ekgs, Cath, and Echo on 01/13/2013  asw

## 2013-02-11 ENCOUNTER — Other Ambulatory Visit (HOSPITAL_COMMUNITY): Payer: Self-pay | Admitting: Obstetrics and Gynecology

## 2013-02-11 DIAGNOSIS — M7989 Other specified soft tissue disorders: Secondary | ICD-10-CM

## 2013-02-12 ENCOUNTER — Ambulatory Visit (HOSPITAL_COMMUNITY)
Admission: RE | Admit: 2013-02-12 | Discharge: 2013-02-12 | Disposition: A | Discharge status: Home / Self Care | Payer: BC Managed Care – PPO | Source: Ambulatory Visit | Attending: Obstetrics and Gynecology | Admitting: Obstetrics and Gynecology

## 2013-02-12 ENCOUNTER — Encounter (HOSPITAL_COMMUNITY): Payer: Self-pay | Admitting: Unknown Physician Specialty

## 2013-02-12 DIAGNOSIS — M79609 Pain in unspecified limb: Secondary | ICD-10-CM

## 2013-02-12 DIAGNOSIS — M7989 Other specified soft tissue disorders: Secondary | ICD-10-CM

## 2013-02-12 NOTE — Progress Notes (Signed)
*  PRELIMINARY RESULTS* Vascular Ultrasound Left lower extremity venous duplex has been completed.  Preliminary findings: Negative for DVT and baker's cyst.  Attempted to call report to [phone], which was the call back number that was given. No answer, office closed.  Let patient leave since study negative.  Farrel Demark, RDMS, RVT  02/12/2013, 1:17 PM

## 2013-02-14 ENCOUNTER — Ambulatory Visit: Payer: BC Managed Care – PPO

## 2013-02-14 ENCOUNTER — Ambulatory Visit (INDEPENDENT_AMBULATORY_CARE_PROVIDER_SITE_OTHER): Payer: BC Managed Care – PPO | Admitting: Family Medicine

## 2013-02-14 VITALS — BP 166/102 | HR 87 | Temp 100.7°F | Resp 16 | Ht 64.0 in | Wt 169.0 lb

## 2013-02-14 DIAGNOSIS — R079 Chest pain, unspecified: Secondary | ICD-10-CM

## 2013-02-14 DIAGNOSIS — R350 Frequency of micturition: Secondary | ICD-10-CM

## 2013-02-14 DIAGNOSIS — J329 Chronic sinusitis, unspecified: Secondary | ICD-10-CM

## 2013-02-14 LAB — POCT UA - MICROSCOPIC ONLY
Casts, Ur, LPF, POC: NEGATIVE
Mucus, UA: NEGATIVE
Yeast, UA: NEGATIVE

## 2013-02-14 LAB — POCT URINALYSIS DIPSTICK
Ketones, UA: NEGATIVE
Leukocytes, UA: NEGATIVE
Protein, UA: NEGATIVE
Spec Grav, UA: 1.015
pH, UA: 7

## 2013-02-14 MED ORDER — AMOXICILLIN 875 MG PO TABS: 875.0000 mg | ORAL_TABLET | Freq: Two times a day (BID) | ORAL | Status: DC

## 2013-02-14 MED ORDER — IPRATROPIUM BROMIDE 0.03 % NA SOLN: 2.0000 | Freq: Two times a day (BID) | NASAL | Status: DC

## 2013-02-14 NOTE — Patient Instructions (Signed)
Get plenty of rest and drink at least 64 ounces of water daily. If the discomfort in your chest worsens, or you develop pain, please go promptly to the emergency department.

## 2013-02-14 NOTE — Progress Notes (Signed)
Subjective:    Patient ID: Kelly Santos, female    DOB: [birth year], 67 y.o.   MRN: [id number]  HPI This 67 y.o. female presents for evaluation of several issues.  Head pressure, "my teeth feel funny," nasal congestion with bloody drainage x 3 days.  No sore throat. No ear discomfort.  Pressing on her forehead and face seems to help a little.  No cough. No chills.  100.4 at home today. Has had a sinusitis previously with dental pain, but felt a bit different from this.  Pain across the back began today-noticed upon waking.  This is different from previous back strain.  Feels like fullness.  No lifting, new activities.  No leg pain, swelling now.  Had leg pain recently, and had a doppler that ruled out DVT.  Given her son's history (1st DVT at age 60, initial evaluation missed it), she's quite anxious about that.  Past medical history, surgical history, family history, social history and problem list reviewed.   Review of Systems No diarrhea ("I'm taking iron pills"), some intermittent nausea.  No vomiting. No chest PAIN, SOB, dizziness. Had urinary frequency yesterday, normal urinary habits today.    Objective:   Physical Exam  Vitals reviewed. Constitutional: She is oriented to person, place, and time. Vital signs are normal. She appears well-developed and well-nourished. She is active and cooperative. No distress.  HENT:  Head: Normocephalic and atraumatic.  Right Ear: Hearing, tympanic membrane and ear canal normal.  Left Ear: Hearing, tympanic membrane and ear canal normal.  Nose: Mucosal edema and rhinorrhea present. No nose lacerations or sinus tenderness.  Mouth/Throat: Uvula is midline and oropharynx is clear and moist. No oral lesions. Dental caries present. No edematous. No oropharyngeal exudate, posterior oropharyngeal edema, posterior oropharyngeal erythema or tonsillar abscesses.  Tenderness with palpation over the maxillary sinuses bilaterally. Posterior pharynx with draiange.   Eyes: EOM are normal. Pupils are equal, round, and reactive to light.  Neck: Normal range of motion. Neck supple. Carotid bruit is not present. No thyromegaly present.  Cardiovascular: Normal rate, regular rhythm and normal heart sounds.   Pulses:      Radial pulses are 2+ on the right side, and 2+ on the left side.  Pulmonary/Chest: Effort normal and breath sounds normal. She exhibits no mass, no tenderness and no bony tenderness.  Lymphadenopathy:       Head (right side): No tonsillar, no preauricular, no posterior auricular and no occipital adenopathy present.       Head (left side): No tonsillar, no preauricular, no posterior auricular and no occipital adenopathy present.    She has no cervical adenopathy.       Right: No supraclavicular adenopathy present.       Left: No supraclavicular adenopathy present.  Neurological: She is alert and oriented to person, place, and time. No sensory deficit.  Skin: Skin is warm, dry and intact. No rash noted. No cyanosis or erythema. Nails show no clubbing.  Psychiatric: She has a normal mood and affect.   CXR: [facility] reading (PRIMARY) by  Dr. Neva Seat.  Cardiomegaly, unchanged from 05/2011.  No infiltrate, evidence of CHF.   EKG: non-specific T-wave changes in lateral leads.  Unchanged from previous tracings 10/2012.      Assessment & Plan:  Urinary frequency - Plan: POCT urinalysis dipstick, POCT UA - Microscopic Only  Sinusitis - Plan: amoxicillin (AMOXIL) 875 MG tablet, ipratropium (ATROVENT) 0.03 % nasal spray  Chest pain - Plan: DG Chest 2 View, EKG 12-Lead  Patient Instructions  Get plenty of rest and drink at least 64 ounces of water daily. If the discomfort in your chest worsens, or you develop pain, please go promptly to the emergency department.    Fernande Bras, PA-C Physician Assistant-Certified [facility]

## 2013-02-15 NOTE — Progress Notes (Signed)
Xray, EKG read,  and patient discussed with Ms. Leotis Shames. Agree with assessment and plan of care per her note.

## 2013-02-25 ENCOUNTER — Other Ambulatory Visit: Payer: Self-pay | Admitting: Obstetrics and Gynecology

## 2013-03-31 ENCOUNTER — Ambulatory Visit (INDEPENDENT_AMBULATORY_CARE_PROVIDER_SITE_OTHER): Payer: BC Managed Care – PPO | Admitting: Family Medicine

## 2013-03-31 VITALS — BP 122/70 | HR 81 | Temp 99.3°F | Resp 16 | Ht 64.5 in | Wt 167.0 lb

## 2013-03-31 DIAGNOSIS — J309 Allergic rhinitis, unspecified: Secondary | ICD-10-CM

## 2013-03-31 DIAGNOSIS — J329 Chronic sinusitis, unspecified: Secondary | ICD-10-CM

## 2013-03-31 MED ORDER — IPRATROPIUM BROMIDE 0.03 % NA SOLN: 2.0000 | Freq: Two times a day (BID) | NASAL | Status: DC

## 2013-03-31 MED ORDER — AMOXICILLIN 875 MG PO TABS: 875.0000 mg | ORAL_TABLET | Freq: Two times a day (BID) | ORAL | Status: DC

## 2013-03-31 NOTE — Progress Notes (Signed)
[facility]:  Office Visit  Chief Complaint:  Chief Complaint  Patient presents with  . Sinus Problem    swelling around nose and mouth teeth are painful x 2 days    HPI: Kelly Santos is a 67 y.o. female who complains of  2 day history of feeling bad, tooth and gum pain and facial pain. No draiange. Dry/ No ear pain. Deneis  Cough, No chills. No fever. Has had sinus sxs and was like this before . She has had some allergies. Shje works in assisted living facility. Denies tobacco use.   Past Medical History  Diagnosis Date  . Hypertension   . Hyperthyroidism   . Hemorrhoids   . Hx of colonic polyps   . Anemia   . GERD (gastroesophageal reflux disease)   . CAD (coronary artery disease)   . Diverticulosis   . Osteoarthritis   . Vitamin D deficiency   . Uterine polyp   . Generalized headaches   . Hiatal hernia     On CT done Jan 31, 2011   . Glaucoma    Past Surgical History  Procedure Laterality Date  . Dilation and curettage of uterus  01/18/2013    with uterine polypectomy   History   Social History  . Marital Status: Legally Separated    Spouse Name: n/a    Number of Children: 3  . Years of Education: N/A   Occupational History  . CNA     [employer]   Social History Main Topics  . Smoking status: Never Smoker   . Smokeless tobacco: Never Used  . Alcohol Use: No  . Drug Use: No  . Sexually Active: Yes   Other Topics Concern  . Not on file   Social History Narrative   Lives with her daughter and granddaughter.   Family History  Problem Relation Age of Onset  . Colon cancer Neg Hx   . Hypertension Mother   . Hypertension Father   . Deep vein thrombosis Son 35    x 2, chronic coumadin   No Known Allergies Prior to Admission medications   Medication Sig Start Date End Date Taking? Authorizing Provider  amitriptyline (ELAVIL) 25 MG tablet TAKE 1 TABLET BY MOUTH EVERY DAY AS NEEDED 01/08/13  Yes Georgina Quint Plotnikov, MD   amLODipine-benazepril (LOTREL) 5-40 MG per capsule Take 1 capsule by mouth daily before breakfast.   Yes Historical Provider, MD  cholecalciferol (VITAMIN D) 1000 UNITS tablet Take 1,000 Units by mouth daily.   Yes Historical Provider, MD  ibuprofen (ADVIL,MOTRIN) 600 MG tablet Take 1 tablet (600 mg total) by mouth every 8 (eight) hours as needed. As needed 08/03/12  Yes Georgina Quint Plotnikov, MD  ipratropium (ATROVENT) 0.03 % nasal spray Place 2 sprays into the nose 2 (two) times daily. 02/14/13  Yes Chelle S Jeffery, PA-C  potassium chloride SA (K-DUR,KLOR-CON) 20 MEQ tablet Take 20 mEq by mouth daily.   Yes Historical Provider, MD  triamcinolone cream (KENALOG) 0.5 % APPLY TWICE A DAY AS NEEDED 01/08/13  Yes Tresa Garter, MD  vitamin C (ASCORBIC ACID) 500 MG tablet Take 500 mg by mouth daily.   Yes Historical Provider, MD  amoxicillin (AMOXIL) 875 MG tablet Take 1 tablet (875 mg total) by mouth 2 (two) times daily. 02/14/13   Chelle S Jeffery, PA-C  progesterone (PROMETRIUM) 200 MG capsule Take 200 mg by mouth daily.    Historical Provider, MD     ROS:  The patient denies  night sweats, unintentional weight loss, chest pain, palpitations, wheezing, dyspnea on exertion, nausea, vomiting, abdominal pain, dysuria, hematuria, melena, numbness, weakness, or tingling.   All other systems have been reviewed and were otherwise negative with the exception of those mentioned in the HPI and as above.    PHYSICAL EXAM: Filed Vitals:   03/31/13 1322  BP: 122/70  Pulse: 81  Temp: 99.3 F (37.4 C)  Resp: 16   Filed Vitals:   03/31/13 1322  Height: 5' 4.5" (1.638 m)  Weight: 167 lb (75.751 kg)   Body mass index is 28.23 kg/(m^2).  General: Alert, no acute distress HEENT:  Normocephalic, atraumatic, oropharynx patent. + boggy nares, erythematous, TM normal. + sinus tenderness. NO exudates.  Cardiovascular:  Regular rate and rhythm, no rubs murmurs or gallops.  No Carotid bruits, radial pulse  intact. No pedal edema.  Respiratory: Clear to auscultation bilaterally.  No wheezes, rales, or rhonchi.  No cyanosis, no use of accessory musculature GI: No organomegaly, abdomen is soft and non-tender, positive bowel sounds.  No masses. Skin: No rashes. Neurologic: Facial musculature symmetric. Psychiatric: Patient is appropriate throughout our interaction. Lymphatic: No cervical lymphadenopathy Musculoskeletal: Gait intact.   LABS: Results for orders placed in visit on 02/14/13  POCT URINALYSIS DIPSTICK      Result Value Range   Color, UA yellow     Clarity, UA clear     Glucose, UA neg     Bilirubin, UA neg     Ketones, UA neg     Spec Grav, UA 1.015     Blood, UA small     pH, UA 7.0     Protein, UA neg     Urobilinogen, UA 0.2     Nitrite, UA neg     Leukocytes, UA Negative    POCT UA - MICROSCOPIC ONLY      Result Value Range   WBC, Ur, HPF, POC neg     RBC, urine, microscopic 0-4     Bacteria, U Microscopic trace     Mucus, UA neg     Epithelial cells, urine per micros 1-3     Crystals, Ur, HPF, POC neg     Casts, Ur, LPF, POC neg     Yeast, UA neg       EKG/XRAY:   Primary read interpreted by Dr. Conley Rolls at [facility].   ASSESSMENT/PLAN: Encounter Diagnoses  Name Primary?  . Sinusitis Yes  . Allergic rhinitis    Rx Atrovent and amoxacillin F/u prn Work note given Gross sideeffects, risk and benefits, and alternatives of medications d/w patient. Patient is aware that all medications have potential sideeffects and we are unable to predict every sideeffect or drug-drug interaction that may occur.     Hamilton Capri PHUONG, DO 03/31/2013 2:51 PM

## 2013-03-31 NOTE — Patient Instructions (Signed)

## 2013-04-22 ENCOUNTER — Encounter (HOSPITAL_COMMUNITY)
Admission: RE | Admit: 2013-04-22 | Discharge: 2013-04-22 | Disposition: A | Discharge status: Home / Self Care | Payer: BC Managed Care – PPO | Source: Ambulatory Visit | Attending: Obstetrics and Gynecology | Admitting: Obstetrics and Gynecology

## 2013-04-22 ENCOUNTER — Encounter (HOSPITAL_COMMUNITY): Payer: Self-pay

## 2013-04-22 DIAGNOSIS — Z01818 Encounter for other preprocedural examination: Secondary | ICD-10-CM | POA: Insufficient documentation

## 2013-04-22 DIAGNOSIS — Z01812 Encounter for preprocedural laboratory examination: Secondary | ICD-10-CM | POA: Insufficient documentation

## 2013-04-22 LAB — BASIC METABOLIC PANEL
GFR calc Af Amer: >90 mL/min (ref >90)
GFR calc non Af Amer: 88 mL/min — ABNORMAL LOW (ref >90)
Potassium: 3.8 mEq/L (ref 3.5–5.1)
Sodium: 136 mEq/L (ref 135–145)

## 2013-04-22 LAB — CBC
MCHC: 33.1 g/dL (ref 30.0–36.0)
RDW: 13.9 % (ref 11.5–15.5)

## 2013-04-22 NOTE — Patient Instructions (Addendum)
20 Kelly Santos  04/22/2013   Your procedure is scheduled on:  04/26/13  Enter through the Main Entrance of [facility] at 6 AM.  Pick up the phone at the desk and dial [phone].   Call this number if you have problems the morning of surgery: [phone]   Remember:   Do not eat food:After Midnight.  Do not drink clear liquids: After Midnight.  Take these medicines the morning of surgery with A SIP OF WATER: Blood pressure and Potassium medications.   Do not wear jewelry, make-up or nail polish.  Do not wear lotions, powders, or perfumes. You may wear deodorant.  Do not shave 48 hours prior to surgery.  Do not bring valuables to the hospital.  [facility] is not responsible                  for any belongings or valuables brought to the hospital.  Contacts, dentures or bridgework may not be worn into surgery.  Leave suitcase in the car. After surgery it may be brought to your room.  For patients admitted to the hospital, checkout time is 11:00 AM the day of                discharge.   Patients discharged the day of surgery will not be allowed to drive                   home.  Name and phone number of your driver: NA  Special Instructions: Shower using CHG 2 nights before surgery and the night before surgery.  If you shower the day of surgery use CHG.  Use special wash - you have one bottle of CHG for all showers.  You should use approximately 1/3 of the bottle for each shower. N/A   Please read over the following fact sheets that you were given: Surgical Site Infection Prevention

## 2013-04-22 NOTE — H&P (Signed)
67 year old G 4 P 3 presents for treatment of symptomatic fibroids. She has had post menopausal bleeding. She had a D and C and Hysteroscopy in April. Pathology benign. Bleeding persisted. Ultrasound this week shows multiple intramural fibroids and 2.5 cm intracavitary mass - probably fibroid. Also possible 2.1 cm left adnexal lesion ? Collapsed hemorrhagic cyst.  Med History  Hypertension  Surgical History Above  Meds See medication list  NKDA  Afebrile VSS General alert and oriented Lung CTAB Car RRR Abdomen is soft and non tender Pelvic  Fibroid uterus greater than 12 week size  IMPRESSION: Symptomatic fibroids  PLAN: TAH and BSO Risks reviewed  Consent is signed

## 2013-04-26 ENCOUNTER — Encounter (HOSPITAL_COMMUNITY): Payer: Self-pay | Admitting: Registered Nurse

## 2013-04-26 ENCOUNTER — Encounter (HOSPITAL_COMMUNITY): Payer: Self-pay | Admitting: Anesthesiology

## 2013-04-26 ENCOUNTER — Inpatient Hospital Stay (HOSPITAL_COMMUNITY)
Admission: RE | Admit: 2013-04-26 | Discharge: 2013-04-29 | DRG: 359 | Disposition: A | Discharge status: Home / Self Care | Payer: BC Managed Care – PPO | Source: Ambulatory Visit | Attending: Obstetrics and Gynecology | Admitting: Obstetrics and Gynecology

## 2013-04-26 ENCOUNTER — Encounter (HOSPITAL_COMMUNITY)
Admission: RE | Disposition: A | Discharge status: Home / Self Care | Payer: Self-pay | Source: Ambulatory Visit | Attending: Obstetrics and Gynecology

## 2013-04-26 ENCOUNTER — Inpatient Hospital Stay (HOSPITAL_COMMUNITY): Payer: BC Managed Care – PPO | Admitting: Anesthesiology

## 2013-04-26 DIAGNOSIS — N8000 Endometriosis of the uterus, unspecified: Secondary | ICD-10-CM | POA: Diagnosis present

## 2013-04-26 DIAGNOSIS — M1990 Unspecified osteoarthritis, unspecified site: Secondary | ICD-10-CM

## 2013-04-26 DIAGNOSIS — R930 Abnormal findings on diagnostic imaging of skull and head, not elsewhere classified: Secondary | ICD-10-CM

## 2013-04-26 DIAGNOSIS — K219 Gastro-esophageal reflux disease without esophagitis: Secondary | ICD-10-CM

## 2013-04-26 DIAGNOSIS — B029 Zoster without complications: Secondary | ICD-10-CM

## 2013-04-26 DIAGNOSIS — Z860100 Personal history of colon polyps, unspecified: Secondary | ICD-10-CM

## 2013-04-26 DIAGNOSIS — E876 Hypokalemia: Secondary | ICD-10-CM

## 2013-04-26 DIAGNOSIS — Z0000 Encounter for general adult medical examination without abnormal findings: Secondary | ICD-10-CM

## 2013-04-26 DIAGNOSIS — J309 Allergic rhinitis, unspecified: Secondary | ICD-10-CM

## 2013-04-26 DIAGNOSIS — M545 Low back pain, unspecified: Secondary | ICD-10-CM

## 2013-04-26 DIAGNOSIS — Z862 Personal history of diseases of the blood and blood-forming organs and certain disorders involving the immune mechanism: Secondary | ICD-10-CM

## 2013-04-26 DIAGNOSIS — N83209 Unspecified ovarian cyst, unspecified side: Secondary | ICD-10-CM | POA: Diagnosis present

## 2013-04-26 DIAGNOSIS — N950 Postmenopausal bleeding: Principal | ICD-10-CM | POA: Diagnosis present

## 2013-04-26 DIAGNOSIS — K649 Unspecified hemorrhoids: Secondary | ICD-10-CM

## 2013-04-26 DIAGNOSIS — M79672 Pain in left foot: Secondary | ICD-10-CM

## 2013-04-26 DIAGNOSIS — B353 Tinea pedis: Secondary | ICD-10-CM

## 2013-04-26 DIAGNOSIS — K5730 Diverticulosis of large intestine without perforation or abscess without bleeding: Secondary | ICD-10-CM

## 2013-04-26 DIAGNOSIS — L84 Corns and callosities: Secondary | ICD-10-CM

## 2013-04-26 DIAGNOSIS — M5412 Radiculopathy, cervical region: Secondary | ICD-10-CM

## 2013-04-26 DIAGNOSIS — N840 Polyp of corpus uteri: Secondary | ICD-10-CM | POA: Diagnosis present

## 2013-04-26 DIAGNOSIS — K625 Hemorrhage of anus and rectum: Secondary | ICD-10-CM

## 2013-04-26 DIAGNOSIS — E0590 Thyrotoxicosis, unspecified without thyrotoxic crisis or storm: Secondary | ICD-10-CM

## 2013-04-26 DIAGNOSIS — E559 Vitamin D deficiency, unspecified: Secondary | ICD-10-CM

## 2013-04-26 DIAGNOSIS — D252 Subserosal leiomyoma of uterus: Secondary | ICD-10-CM | POA: Diagnosis present

## 2013-04-26 DIAGNOSIS — I5189 Other ill-defined heart diseases: Secondary | ICD-10-CM

## 2013-04-26 DIAGNOSIS — K635 Polyp of colon: Secondary | ICD-10-CM

## 2013-04-26 DIAGNOSIS — I10 Essential (primary) hypertension: Secondary | ICD-10-CM

## 2013-04-26 DIAGNOSIS — I2510 Atherosclerotic heart disease of native coronary artery without angina pectoris: Secondary | ICD-10-CM

## 2013-04-26 DIAGNOSIS — J329 Chronic sinusitis, unspecified: Secondary | ICD-10-CM

## 2013-04-26 DIAGNOSIS — R51 Headache: Secondary | ICD-10-CM

## 2013-04-26 DIAGNOSIS — M79609 Pain in unspecified limb: Secondary | ICD-10-CM

## 2013-04-26 DIAGNOSIS — D251 Intramural leiomyoma of uterus: Secondary | ICD-10-CM | POA: Diagnosis present

## 2013-04-26 DIAGNOSIS — R209 Unspecified disturbances of skin sensation: Secondary | ICD-10-CM

## 2013-04-26 DIAGNOSIS — M79605 Pain in left leg: Secondary | ICD-10-CM

## 2013-04-26 HISTORY — PX: SALPINGOOPHORECTOMY: SHX82

## 2013-04-26 HISTORY — PX: ABDOMINAL HYSTERECTOMY: SHX81

## 2013-04-26 SURGERY — HYSTERECTOMY, ABDOMINAL
Anesthesia: General | Wound class: Clean Contaminated

## 2013-04-26 MED ORDER — CEFAZOLIN SODIUM-DEXTROSE 2-3 GM-% IV SOLR
2.0000 g | INTRAVENOUS | Status: AC
  Administered 2013-04-26: 2 g via INTRAVENOUS

## 2013-04-26 MED ORDER — FENTANYL CITRATE 0.05 MG/ML IJ SOLN
25.0000 ug | INTRAMUSCULAR | Status: DC | PRN
  Administered 2013-04-26 (×2): 50 ug via INTRAVENOUS

## 2013-04-26 MED ORDER — GLYCOPYRROLATE 0.2 MG/ML IJ SOLN
INTRAMUSCULAR | Status: AC
  Filled 2013-04-26: qty 2

## 2013-04-26 MED ORDER — BUPIVACAINE HCL (PF) 0.25 % IJ SOLN
INTRAMUSCULAR | Status: AC
  Filled 2013-04-26: qty 30

## 2013-04-26 MED ORDER — ROCURONIUM BROMIDE 50 MG/5ML IV SOLN
INTRAVENOUS | Status: AC
  Filled 2013-04-26: qty 1

## 2013-04-26 MED ORDER — ONDANSETRON HCL 4 MG/2ML IJ SOLN
INTRAMUSCULAR | Status: AC
  Filled 2013-04-26: qty 2

## 2013-04-26 MED ORDER — FENTANYL CITRATE 0.05 MG/ML IJ SOLN
INTRAMUSCULAR | Status: DC | PRN
  Administered 2013-04-26 (×2): 100 ug via INTRAVENOUS

## 2013-04-26 MED ORDER — MEPERIDINE HCL 25 MG/ML IJ SOLN: 6.2500 mg | INTRAMUSCULAR | Status: DC | PRN

## 2013-04-26 MED ORDER — TEMAZEPAM 15 MG PO CAPS: 15.0000 mg | ORAL_CAPSULE | Freq: Every evening | ORAL | Status: DC | PRN

## 2013-04-26 MED ORDER — DIPHENHYDRAMINE HCL 12.5 MG/5ML PO ELIX
12.5000 mg | ORAL_SOLUTION | Freq: Four times a day (QID) | ORAL | Status: DC | PRN
  Filled 2013-04-26: qty 5

## 2013-04-26 MED ORDER — DEXAMETHASONE SODIUM PHOSPHATE 10 MG/ML IJ SOLN
INTRAMUSCULAR | Status: DC | PRN
  Administered 2013-04-26: 10 mg via INTRAVENOUS

## 2013-04-26 MED ORDER — DEXTROSE IN LACTATED RINGERS 5 % IV SOLN
INTRAVENOUS | Status: DC
  Administered 2013-04-26 – 2013-04-27 (×3): via INTRAVENOUS

## 2013-04-26 MED ORDER — KETOROLAC TROMETHAMINE 30 MG/ML IJ SOLN: 15.0000 mg | Freq: Once | INTRAMUSCULAR | Status: DC | PRN

## 2013-04-26 MED ORDER — PROPOFOL 10 MG/ML IV BOLUS
INTRAVENOUS | Status: DC | PRN
  Administered 2013-04-26: 160 mg via INTRAVENOUS

## 2013-04-26 MED ORDER — FENTANYL CITRATE 0.05 MG/ML IJ SOLN
INTRAMUSCULAR | Status: AC
  Filled 2013-04-26: qty 5

## 2013-04-26 MED ORDER — HYDROMORPHONE HCL PF 1 MG/ML IJ SOLN
INTRAMUSCULAR | Status: DC | PRN
  Administered 2013-04-26: 1 mg via INTRAVENOUS

## 2013-04-26 MED ORDER — ONDANSETRON HCL 4 MG/2ML IJ SOLN: 4.0000 mg | Freq: Four times a day (QID) | INTRAMUSCULAR | Status: DC | PRN

## 2013-04-26 MED ORDER — AMLODIPINE BESYLATE 5 MG PO TABS
5.0000 mg | ORAL_TABLET | Freq: Every day | ORAL | Status: DC
  Administered 2013-04-27 – 2013-04-29 (×3): 5 mg via ORAL
  Filled 2013-04-26 (×4): qty 1

## 2013-04-26 MED ORDER — DIPHENHYDRAMINE HCL 50 MG/ML IJ SOLN: 12.5000 mg | Freq: Four times a day (QID) | INTRAMUSCULAR | Status: DC | PRN

## 2013-04-26 MED ORDER — NEOSTIGMINE METHYLSULFATE 1 MG/ML IJ SOLN
INTRAMUSCULAR | Status: AC
  Filled 2013-04-26: qty 1

## 2013-04-26 MED ORDER — MENTHOL 3 MG MT LOZG: 1.0000 | LOZENGE | OROMUCOSAL | Status: DC | PRN

## 2013-04-26 MED ORDER — PNEUMOCOCCAL VAC POLYVALENT 25 MCG/0.5ML IJ INJ
0.5000 mL | INJECTION | INTRAMUSCULAR | Status: AC
  Administered 2013-04-27: 0.5 mL via INTRAMUSCULAR
  Filled 2013-04-26: qty 0.5

## 2013-04-26 MED ORDER — BENAZEPRIL HCL 40 MG PO TABS
40.0000 mg | ORAL_TABLET | Freq: Every day | ORAL | Status: DC
  Administered 2013-04-27 – 2013-04-29 (×3): 40 mg via ORAL
  Filled 2013-04-26 (×4): qty 1

## 2013-04-26 MED ORDER — KETOROLAC TROMETHAMINE 30 MG/ML IJ SOLN: 30.0000 mg | Freq: Once | INTRAMUSCULAR | Status: DC

## 2013-04-26 MED ORDER — NALOXONE HCL 0.4 MG/ML IJ SOLN: 0.4000 mg | INTRAMUSCULAR | Status: DC | PRN

## 2013-04-26 MED ORDER — FENTANYL CITRATE 0.05 MG/ML IJ SOLN
INTRAMUSCULAR | Status: AC
  Administered 2013-04-26: 50 ug via INTRAVENOUS
  Filled 2013-04-26: qty 2

## 2013-04-26 MED ORDER — DEXAMETHASONE SODIUM PHOSPHATE 10 MG/ML IJ SOLN
INTRAMUSCULAR | Status: AC
  Filled 2013-04-26: qty 1

## 2013-04-26 MED ORDER — PROPOFOL 10 MG/ML IV EMUL
INTRAVENOUS | Status: AC
  Filled 2013-04-26: qty 20

## 2013-04-26 MED ORDER — CEFAZOLIN SODIUM-DEXTROSE 2-3 GM-% IV SOLR
INTRAVENOUS | Status: AC
  Filled 2013-04-26: qty 50

## 2013-04-26 MED ORDER — TRAMADOL HCL 50 MG PO TABS
50.0000 mg | ORAL_TABLET | Freq: Four times a day (QID) | ORAL | Status: DC | PRN
  Administered 2013-04-27 – 2013-04-28 (×3): 50 mg via ORAL
  Filled 2013-04-26 (×3): qty 1

## 2013-04-26 MED ORDER — IBUPROFEN 600 MG PO TABS
600.0000 mg | ORAL_TABLET | Freq: Four times a day (QID) | ORAL | Status: DC | PRN
  Administered 2013-04-27 – 2013-04-28 (×3): 600 mg via ORAL
  Filled 2013-04-26 (×3): qty 1

## 2013-04-26 MED ORDER — ONDANSETRON HCL 4 MG/2ML IJ SOLN
INTRAMUSCULAR | Status: DC | PRN
  Administered 2013-04-26: 4 mg via INTRAVENOUS

## 2013-04-26 MED ORDER — 0.9 % SODIUM CHLORIDE (POUR BTL) OPTIME
TOPICAL | Status: DC | PRN
  Administered 2013-04-26: 1000 mL

## 2013-04-26 MED ORDER — FENTANYL CITRATE 0.05 MG/ML IJ SOLN
INTRAMUSCULAR | Status: AC
  Filled 2013-04-26: qty 2

## 2013-04-26 MED ORDER — LIDOCAINE HCL (CARDIAC) 20 MG/ML IV SOLN
INTRAVENOUS | Status: AC
  Filled 2013-04-26: qty 5

## 2013-04-26 MED ORDER — MIDAZOLAM HCL 2 MG/2ML IJ SOLN
INTRAMUSCULAR | Status: AC
  Filled 2013-04-26: qty 2

## 2013-04-26 MED ORDER — MIDAZOLAM HCL 2 MG/2ML IJ SOLN: 0.5000 mg | Freq: Once | INTRAMUSCULAR | Status: DC | PRN

## 2013-04-26 MED ORDER — HYDROMORPHONE HCL PF 1 MG/ML IJ SOLN
INTRAMUSCULAR | Status: AC
  Filled 2013-04-26: qty 1

## 2013-04-26 MED ORDER — IPRATROPIUM BROMIDE 0.03 % NA SOLN
2.0000 | Freq: Two times a day (BID) | NASAL | Status: DC
  Filled 2013-04-26: qty 30

## 2013-04-26 MED ORDER — AMITRIPTYLINE HCL 25 MG PO TABS
25.0000 mg | ORAL_TABLET | Freq: Every day | ORAL | Status: DC
  Administered 2013-04-27 – 2013-04-28 (×2): 25 mg via ORAL
  Filled 2013-04-26 (×4): qty 1

## 2013-04-26 MED ORDER — LIDOCAINE HCL (CARDIAC) 20 MG/ML IV SOLN
INTRAVENOUS | Status: DC | PRN
  Administered 2013-04-26: 50 mg via INTRAVENOUS

## 2013-04-26 MED ORDER — NEOSTIGMINE METHYLSULFATE 1 MG/ML IJ SOLN
INTRAMUSCULAR | Status: DC | PRN
  Administered 2013-04-26: 3 mg via INTRAVENOUS

## 2013-04-26 MED ORDER — LACTATED RINGERS IV SOLN
INTRAVENOUS | Status: DC
  Administered 2013-04-26 (×2): via INTRAVENOUS

## 2013-04-26 MED ORDER — HYDROMORPHONE 0.3 MG/ML IV SOLN
INTRAVENOUS | Status: DC
  Administered 2013-04-26: 4.67 mL via INTRAVENOUS
  Administered 2013-04-26: 0.199 mg via INTRAVENOUS
  Administered 2013-04-26: 0.2 mg via INTRAVENOUS
  Administered 2013-04-26: 6.67 mL via INTRAVENOUS
  Administered 2013-04-27: 0.2 mg via INTRAVENOUS
  Administered 2013-04-27: 0.999 mg via INTRAVENOUS
  Filled 2013-04-26: qty 25

## 2013-04-26 MED ORDER — SODIUM CHLORIDE 0.9 % IJ SOLN: 9.0000 mL | INTRAMUSCULAR | Status: DC | PRN

## 2013-04-26 MED ORDER — PROMETHAZINE HCL 25 MG/ML IJ SOLN: 6.2500 mg | INTRAMUSCULAR | Status: DC | PRN

## 2013-04-26 MED ORDER — AMLODIPINE BESY-BENAZEPRIL HCL 5-40 MG PO CAPS: 1.0000 | ORAL_CAPSULE | Freq: Every day | ORAL | Status: DC

## 2013-04-26 MED ORDER — MIDAZOLAM HCL 5 MG/5ML IJ SOLN
INTRAMUSCULAR | Status: DC | PRN
  Administered 2013-04-26: 2 mg via INTRAVENOUS

## 2013-04-26 MED ORDER — GLYCOPYRROLATE 0.2 MG/ML IJ SOLN
INTRAMUSCULAR | Status: AC
  Filled 2013-04-26: qty 1

## 2013-04-26 MED ORDER — KETOROLAC TROMETHAMINE 30 MG/ML IJ SOLN
INTRAMUSCULAR | Status: AC
  Filled 2013-04-26: qty 1

## 2013-04-26 MED ORDER — KETOROLAC TROMETHAMINE 30 MG/ML IJ SOLN
INTRAMUSCULAR | Status: DC | PRN
  Administered 2013-04-26: 30 mg via INTRAVENOUS

## 2013-04-26 MED ORDER — ROCURONIUM BROMIDE 100 MG/10ML IV SOLN
INTRAVENOUS | Status: DC | PRN
  Administered 2013-04-26: 50 mg via INTRAVENOUS

## 2013-04-26 MED ORDER — GLYCOPYRROLATE 0.2 MG/ML IJ SOLN
INTRAMUSCULAR | Status: DC | PRN
  Administered 2013-04-26: 0.6 mg via INTRAVENOUS

## 2013-04-26 MED ORDER — LACTATED RINGERS IV SOLN: INTRAVENOUS | Status: DC

## 2013-04-26 SURGICAL SUPPLY — 39 items
ADH SKN CLS APL DERMABOND .7 (GAUZE/BANDAGES/DRESSINGS) ×1
BRR ADH 6X5 SEPRAFILM 1 SHT (MISCELLANEOUS)
CANISTER SUCTION 2500CC (MISCELLANEOUS) ×3 IMPLANT
CHLORAPREP W/TINT 26ML (MISCELLANEOUS) ×3 IMPLANT
CLOTH BEACON ORANGE TIMEOUT ST (SAFETY) ×3 IMPLANT
DECANTER SPIKE VIAL GLASS SM (MISCELLANEOUS) IMPLANT
DERMABOND ADVANCED (GAUZE/BANDAGES/DRESSINGS) ×1
DERMABOND ADVANCED .7 DNX12 (GAUZE/BANDAGES/DRESSINGS) ×2 IMPLANT
GAUZE SPONGE 4X4 16PLY XRAY LF (GAUZE/BANDAGES/DRESSINGS) IMPLANT
GLOVE BIO SURGEON STRL SZ 6.5 (GLOVE) ×3 IMPLANT
GLOVE BIO SURGEON STRL SZ7.5 (GLOVE) ×3 IMPLANT
GLOVE BIOGEL PI IND STRL 7.0 (GLOVE) ×6 IMPLANT
GLOVE BIOGEL PI INDICATOR 7.0 (GLOVE) ×3
GLOVE SURG SS PI 7.0 STRL IVOR (GLOVE) ×6 IMPLANT
GOWN PREVENTION PLUS LG XLONG (DISPOSABLE) ×9 IMPLANT
NEEDLE HYPO 22GX1.5 SAFETY (NEEDLE) IMPLANT
NS IRRIG 1000ML POUR BTL (IV SOLUTION) ×3 IMPLANT
PACK ABDOMINAL GYN (CUSTOM PROCEDURE TRAY) ×3 IMPLANT
PAD OB MATERNITY 4.3X12.25 (PERSONAL CARE ITEMS) ×3 IMPLANT
PROTECTOR NERVE ULNAR (MISCELLANEOUS) ×3 IMPLANT
SEPRAFILM MEMBRANE 5X6 (MISCELLANEOUS) IMPLANT
SPONGE LAP 18X18 X RAY DECT (DISPOSABLE) IMPLANT
STAPLER VISISTAT 35W (STAPLE) IMPLANT
SUT PDS AB 0 CT 36 (SUTURE) IMPLANT
SUT PDS AB 0 CTX 60 (SUTURE) IMPLANT
SUT PLAIN 2 0 XLH (SUTURE) IMPLANT
SUT VIC AB 0 CT1 18XCR BRD8 (SUTURE) ×4 IMPLANT
SUT VIC AB 0 CT1 27 (SUTURE) ×12
SUT VIC AB 0 CT1 27XBRD ANBCTR (SUTURE) ×8 IMPLANT
SUT VIC AB 0 CT1 8-18 (SUTURE) ×4
SUT VIC AB 3-0 PS1 18 (SUTURE)
SUT VIC AB 3-0 PS1 18X BRD (SUTURE) IMPLANT
SUT VIC AB 4-0 KS 27 (SUTURE) ×3 IMPLANT
SUT VICRYL 0 TIES 12 18 (SUTURE) ×3 IMPLANT
SYR CONTROL 10ML LL (SYRINGE) IMPLANT
SYRINGE 10CC LL (SYRINGE) IMPLANT
TOWEL OR 17X24 6PK STRL BLUE (TOWEL DISPOSABLE) ×6 IMPLANT
TRAY FOLEY CATH 14FR (SET/KITS/TRAYS/PACK) ×3 IMPLANT
WATER STERILE IRR 1000ML POUR (IV SOLUTION) ×3 IMPLANT

## 2013-04-26 NOTE — Preoperative (Signed)
Beta Blockers   Reason not to administer Beta Blockers:Not Applicable 

## 2013-04-26 NOTE — Brief Op Note (Signed)
04/26/2013  8:29 AM  PATIENT:  Kelly Santos  67 y.o. female  PRE-OPERATIVE DIAGNOSIS:  Post menopausal bleeding, symptomatic fibroids cpt 58150  POST-OPERATIVE DIAGNOSIS:  Post menopausal bleeding, symptomatic fibroids  PROCEDURE:  Procedure(s): HYSTERECTOMY ABDOMINAL (N/A) SALPINGO OOPHORECTOMY (Bilateral)  SURGEON:  Surgeon(s) and Role:    * Jeani Hawking, MD - Primary    * W Varney Baas, MD - Assisting  PHYSICIAN ASSISTANT:   ASSISTANTS: none   ANESTHESIA:   general  EBL:  Total I/O In: 1000 [I.V.:1000] Out: 600 [Urine:500; Blood:100]  BLOOD ADMINISTERED:none  DRAINS: Urinary Catheter (Foley)   LOCAL MEDICATIONS USED:  NONE  SPECIMEN:  Source of Specimen:  uterus, cervix, tubes and ovaries  DISPOSITION OF SPECIMEN:  PATHOLOGY  COUNTS:  YES  TOURNIQUET:  * No tourniquets in log *  DICTATION: .Other Dictation: Dictation Number [id number]  PLAN OF CARE: Admit to inpatient   PATIENT DISPOSITION:  PACU - hemodynamically stable.   Delay start of Pharmacological VTE agent (>24hrs) due to surgical blood loss or risk of bleeding: not applicable

## 2013-04-26 NOTE — Anesthesia Preprocedure Evaluation (Addendum)
Anesthesia Evaluation  Patient identified by MRN, date of birth, ID band Patient awake    Reviewed: Allergy & Precautions, H&P , Patient's Chart, lab work & pertinent test results, reviewed documented beta blocker date and time   History of Anesthesia Complications Negative for: history of anesthetic complications  Airway Mallampati: II TM Distance: >3 FB Neck ROM: full    Dental no notable dental hx.    Pulmonary neg pulmonary ROS,  breath sounds clear to auscultation  Pulmonary exam normal       Cardiovascular Exercise Tolerance: Good hypertension, + CAD negative cardio ROS  Rhythm:regular Rate:Normal     Neuro/Psych  Headaches,  Neuromuscular disease negative neurological ROS  negative psych ROS   GI/Hepatic negative GI ROS, Neg liver ROS, hiatal hernia, GERD-  Controlled,  Endo/Other  negative endocrine ROSHyperthyroidism   Renal/GU negative Renal ROS     Musculoskeletal   Abdominal   Peds  Hematology negative hematology ROS (+) anemia ,   Anesthesia Other Findings  Hypertension     Hyperthyroidism        Hemorrhoids     Hx of colonic polyps        Anemia     GERD (gastroesophageal reflux disease)        Diverticulosis     Osteoarthritis        Vitamin D deficiency     Uterine polyp        Generalized headaches     Hiatal hernia   On CT done Jan 31, 2011     Glaucoma     CAD (coronary artery disease)   no cardiologist, followed by PCP only    Reproductive/Obstetrics negative OB ROS                           Anesthesia Physical Anesthesia Plan  ASA: III  Anesthesia Plan: General ETT   Post-op Pain Management:    Induction:   Airway Management Planned:   Additional Equipment:   Intra-op Plan:   Post-operative Plan:   Informed Consent: I have reviewed the patients History and Physical, chart, labs and discussed the procedure including the risks, benefits and alternatives  for the proposed anesthesia with the patient or authorized representative who has indicated his/her understanding and acceptance.   Dental Advisory Given  Plan Discussed with: CRNA and Surgeon  Anesthesia Plan Comments:         Anesthesia Quick Evaluation Glaucoma... Avoid succ

## 2013-04-26 NOTE — Op Note (Signed)
Kelly Santos, Kelly Santos                 ACCOUNT NO.:  [id number]  MEDICAL RECORD NO.:  [id number]  LOCATION:  [facility]                          FACILITY:  [facility]  PHYSICIAN:  Kendricks Reap L. Kelly Santos, M.D.DATE OF BIRTH:  08-18-1946  DATE OF PROCEDURE:  04/26/2013 DATE OF DISCHARGE:                              OPERATIVE REPORT   PREOPERATIVE DIAGNOSIS:  Symptomatic fibroids and postmenopausal bleeding.  POSTOPERATIVE DIAGNOSIS:  Symptomatic fibroids and postmenopausal bleeding.  PROCEDURE:  Total abdominal hysterectomy with bilateral salpingo- oophorectomy.  SURGEON:  Kelly Santos, M.D.  ASSISTANT:  Ponciano Ort, MD.  EBL:  100 mL.  COMPLICATIONS:  None.  DRAINS:  Foley.  PATHOLOGY:  Uterus, cervix, tubes, and ovaries.  PROCEDURE IN DETAIL:  The patient was taken to the operating room.  She was intubated.  She was prepped and draped.  A Foley catheter was inserted.  A low transverse incision was made and carried down to the fascia.  Fascia scored in the midline extended laterally.  Rectus muscles were separated in the midline.  The peritoneum was entered bluntly.  The peritoneum was entered bluntly and the peritoneal incision was then stretched.  The self-retaining retractor was placed in the abdominal cavity.  A small and large bowel were placed in the upper abdomen.  The packs were placed in the upper abdomen.  Exam of the pelvis revealed that the uterus was markedly enlarged, was about 12-14 week sized myomatous.  The adnexa were normal.  Kelly clamps were placed across the triple pedicle, the uterus was elevated and left it that.  We then identified the round ligament on each side.  The round ligament on the right side was suture ligated.  An avascular window was created beneath the IP ligament and curved Heaney clamps were placed across that.  The pedicle was clamped, cut, and suture ligated using 0 Vicryl suture and a free tie was placed across that as well.  The same  thing was done on the left side.  After we freed up the tubes and ovaries, we then skeletonized the uterine artery and developed our bladder flap in standard fashion with sharp dissection and curved Heaney clamps were placed across the internal cervical internal os.  Each pedicle was clamped, cut, and suture ligated using 0 Vicryl suture.  The bladder flap was further developed easily and then straight clamps were placed across the uterosacral cardinal ligament complex on either side.  Each pedicle was cut and suture ligated using 0 Vicryl suture.  We then placed curved Heaney clamps beneath the external os.  The specimen was removed and identified the cervix, the uterus, tubes, and ovaries and sent to pathology.  The angle stitches were placed using 0 Vicryl suture.  The remainder of the cuff was closed using 0 Vicryl suture. Irrigation was performed.  Hemostasis was excellent.  The peritoneum was closed using 0 Vicryl.  After all lap sponges and instruments removed from the abdominal cavity, the fascia was closed using 0 Vicryl in a running stitch starting each corner meeting in the midline.  After irrigation of subcutaneous layer, the skin was closed with a 4-0 Vicryl on a Keith needle.  All  sponge, lap, and instrument counts were correct x2.  The patient went to recovery room in stable condition.     Kelly Santos L. Vincente Santos, M.D.     Florestine Avers  D:  04/26/2013  T:  04/26/2013  Job:  [id number]

## 2013-04-26 NOTE — Transfer of Care (Signed)
Immediate Anesthesia Transfer of Care Note  Patient: Kelly Santos  Procedure(s) Performed: Procedure(s): HYSTERECTOMY ABDOMINAL (N/A) SALPINGO OOPHORECTOMY (Bilateral)  Patient Location: PACU  Anesthesia Type:General  Level of Consciousness: sedated  Airway & Oxygen Therapy: Patient Spontanous Breathing and Patient connected to nasal cannula oxygen  Post-op Assessment: Report given to PACU RN and Post -op Vital signs reviewed and stable  Post vital signs: Reviewed  Complications: No apparent anesthesia complications

## 2013-04-26 NOTE — Progress Notes (Signed)
Pt remains hypertensive with diastolic pressures in the high 90's. Dr. Jean Rosenthal made aware, states pressures are consistent with preop pressures and baseline, will transfer to floor when nurse available

## 2013-04-26 NOTE — Anesthesia Postprocedure Evaluation (Signed)
Anesthesia Post Note  Patient: Kelly Santos  Procedure(s) Performed: Procedure(s) (LRB): HYSTERECTOMY ABDOMINAL (N/A) SALPINGO OOPHORECTOMY (Bilateral)  Anesthesia type: General  Patient location: Mother/Baby  Post pain: Pain level controlled  Post assessment: Post-op Vital signs reviewed  Last Vitals:  Filed Vitals:   04/26/13 1249  BP: 150/95  Pulse: 76  Temp: 36.8 C  Resp: 18    Post vital signs: Reviewed  Level of consciousness: awake and alert   Complications: No apparent anesthesia complications

## 2013-04-26 NOTE — Progress Notes (Signed)
H and P on the chart No significant changes Will proceed with TAH and BSO Consent signed 

## 2013-04-26 NOTE — Anesthesia Postprocedure Evaluation (Signed)
  Anesthesia Post-op Note  Patient: Kelly Santos  Procedure(s) Performed: Procedure(s): HYSTERECTOMY ABDOMINAL (N/A) SALPINGO OOPHORECTOMY (Bilateral) Patient is awake and responsive. Pain and nausea are reasonably well controlled. Vital signs are stable and clinically acceptable. Oxygen saturation is clinically acceptable. There are no apparent anesthetic complications at this time. Patient is ready for discharge.

## 2013-04-27 ENCOUNTER — Encounter (HOSPITAL_COMMUNITY): Payer: Self-pay | Admitting: Obstetrics and Gynecology

## 2013-04-27 LAB — CBC
Hemoglobin: 8.2 g/dL — ABNORMAL LOW (ref 12.0–15.0)
MCH: 32.4 pg (ref 26.0–34.0)
MCHC: 33.7 g/dL (ref 30.0–36.0)
Platelets: 217 10*3/uL (ref 150–400)
RBC: 2.53 MIL/uL — ABNORMAL LOW (ref 3.87–5.11)

## 2013-04-27 MED ORDER — BRIMONIDINE TARTRATE 0.2 % OP SOLN
1.0000 [drp] | Freq: Two times a day (BID) | OPHTHALMIC | Status: DC
  Filled 2013-04-27: qty 5

## 2013-04-27 MED ORDER — OXYCODONE-ACETAMINOPHEN 5-325 MG PO TABS
1.0000 | ORAL_TABLET | ORAL | Status: DC | PRN
  Administered 2013-04-28 (×2): 1 via ORAL
  Administered 2013-04-29: 2 via ORAL
  Filled 2013-04-27 (×2): qty 1
  Filled 2013-04-27: qty 2

## 2013-04-27 MED ORDER — BRIMONIDINE TARTRATE-TIMOLOL 0.2-0.5 % OP SOLN
1.0000 [drp] | Freq: Two times a day (BID) | OPHTHALMIC | Status: DC
  Administered 2013-04-27 – 2013-04-29 (×4): 1 [drp] via OPHTHALMIC

## 2013-04-27 MED ORDER — PREDNISOLONE ACETATE 1 % OP SUSP
1.0000 [drp] | Freq: Three times a day (TID) | OPHTHALMIC | Status: DC
  Filled 2013-04-27: qty 1

## 2013-04-27 NOTE — Progress Notes (Signed)
1 Day Post-Op Procedure(s) (LRB): HYSTERECTOMY ABDOMINAL (N/A) SALPINGO OOPHORECTOMY (Bilateral)  Subjective: Patient reports tolerating PO and + flatus.    Objective: I have reviewed patient's vital signs, intake and output, medications and labs.  General: alert, cooperative and appears stated age Vaginal Bleeding: none Abdomen is soft and flat and non tender and incision clean and dry and intact  Assessment: s/p Procedure(s): HYSTERECTOMY ABDOMINAL (N/A) SALPINGO OOPHORECTOMY (Bilateral): stable, progressing well and tolerating diet  Plan: Advance diet Encourage ambulation Advance to PO medication Discontinue IV fluids  LOS: 1 day    Jatin Naumann L 04/27/2013, 7:50 AM

## 2013-04-28 MED ORDER — IPRATROPIUM BROMIDE 0.03 % NA SOLN: 2.0000 | Freq: Two times a day (BID) | NASAL | Status: DC

## 2013-04-28 MED ORDER — TRAMADOL HCL 50 MG PO TABS: 50.0000 mg | ORAL_TABLET | Freq: Four times a day (QID) | ORAL | Status: DC | PRN

## 2013-04-28 MED ORDER — BISACODYL 10 MG RE SUPP
10.0000 mg | Freq: Once | RECTAL | Status: AC
  Administered 2013-04-28: 10 mg via RECTAL
  Filled 2013-04-28: qty 1

## 2013-04-28 MED ORDER — IBUPROFEN 600 MG PO TABS: 600.0000 mg | ORAL_TABLET | Freq: Four times a day (QID) | ORAL | Status: DC | PRN

## 2013-04-28 NOTE — Discharge Summary (Signed)
Admission Diagnosis: Symptomatic Fibroids Post menopausal Bleeding  Discharge Diagnosis: Same  Hospital Course: 67 year old female with symptomatic fibroids and postmenopausal bleeding. She underwent uncomplicated TAH and BSO.  She was ambulating and voiding and tolerating regular diet by POD #2 Went home in stable condition on POD #2 On Rx Ibuprofen and Ultram Follow up in 1 week Discharge Precautions given.

## 2013-04-28 NOTE — Progress Notes (Signed)
2 Days Post-Op Procedure(s) (LRB): HYSTERECTOMY ABDOMINAL (N/A) SALPINGO OOPHORECTOMY (Bilateral)  Subjective: Patient reports incisional pain, tolerating PO, + flatus and no problems voiding.    Objective: I have reviewed patient's vital signs, intake and output, medications and labs.  General: alert, cooperative and appears stated age Vaginal Bleeding: none Abdomen is soft and non tender  Assessment: s/p Procedure(s): HYSTERECTOMY ABDOMINAL (N/A) SALPINGO OOPHORECTOMY (Bilateral): stable, progressing well and tolerating diet  Plan: Discharge home  LOS: 2 days    Dabid Godown L 04/28/2013, 8:44 AM

## 2013-04-29 MED ORDER — OXYCODONE-ACETAMINOPHEN 5-325 MG PO TABS: 1.0000 | ORAL_TABLET | ORAL | Status: DC | PRN

## 2013-04-29 NOTE — Discharge Summary (Signed)
Kept patient overnight because of gas pain.  She now feels much better. Eating breakfast. Afebrile vss Alert and oriented Lung CTAB Car RRR Abdomen soft and non tender   IMPRESSION: POD #3 Feeling much better Discharge home Add rx Percocet Followup in 1 week

## 2013-06-16 ENCOUNTER — Other Ambulatory Visit (INDEPENDENT_AMBULATORY_CARE_PROVIDER_SITE_OTHER): Payer: BC Managed Care – PPO

## 2013-06-16 ENCOUNTER — Ambulatory Visit (INDEPENDENT_AMBULATORY_CARE_PROVIDER_SITE_OTHER): Payer: BC Managed Care – PPO | Admitting: Internal Medicine

## 2013-06-16 ENCOUNTER — Encounter: Payer: Self-pay | Admitting: Internal Medicine

## 2013-06-16 VITALS — BP 144/98 | HR 72 | Temp 97.8°F | Resp 12 | Wt 167.0 lb

## 2013-06-16 DIAGNOSIS — M79609 Pain in unspecified limb: Secondary | ICD-10-CM

## 2013-06-16 DIAGNOSIS — M79672 Pain in left foot: Secondary | ICD-10-CM

## 2013-06-16 LAB — BASIC METABOLIC PANEL
CO2: 28 mEq/L (ref 19–32)
Calcium: 9.4 mg/dL (ref 8.4–10.5)
GFR: 114.87 mL/min (ref >60.00)
Glucose, Bld: 97 mg/dL (ref 70–99)
Potassium: 3.4 mEq/L — ABNORMAL LOW (ref 3.5–5.1)
Sodium: 138 mEq/L (ref 135–145)

## 2013-06-16 LAB — SEDIMENTATION RATE: Sed Rate: 16 mm/hr (ref 0–22)

## 2013-06-16 MED ORDER — IBUPROFEN 600 MG PO TABS: 600.0000 mg | ORAL_TABLET | Freq: Four times a day (QID) | ORAL | Status: DC | PRN

## 2013-06-16 NOTE — Assessment & Plan Note (Signed)
9/14 L dorso-lat aspect Nl X ray in 7/13 Labs NSAIDs

## 2013-06-16 NOTE — Progress Notes (Signed)
   Subjective:     HPI  C/o L foot swelling x a few days; mild pain - resolved It happened several times after hysterectomy 7/14 The patient needs to address  chronic hypertension, cataracts  BP Readings from Last 3 Encounters:  06/16/13 144/98  04/29/13 120/63  04/29/13 120/63   Wt Readings from Last 3 Encounters:  06/16/13 167 lb (75.751 kg)  04/26/13 164 lb (74.39 kg)  04/26/13 164 lb (74.39 kg)      Review of Systems  Constitutional: Negative for fever, chills, diaphoresis, activity change, appetite change, fatigue and unexpected weight change.  HENT: Negative for hearing loss, ear pain, congestion, sore throat, sneezing, mouth sores, neck pain, dental problem, voice change, postnasal drip and sinus pressure.   Eyes: Negative for pain and visual disturbance.  Respiratory: Negative for cough, chest tightness, wheezing and stridor.   Cardiovascular: Negative for chest pain, palpitations and leg swelling.  Gastrointestinal: Negative for nausea, vomiting, abdominal pain, blood in stool, abdominal distention and rectal pain.  Genitourinary: Negative for dysuria, hematuria, decreased urine volume, vaginal bleeding, vaginal discharge, difficulty urinating, vaginal pain and menstrual problem.  Musculoskeletal: Negative for back pain, joint swelling and gait problem.  Skin: Negative for color change, rash and wound.  Neurological: Negative for dizziness, tremors, syncope, speech difficulty and light-headedness.  Hematological: Negative for adenopathy.  Psychiatric/Behavioral: Negative for suicidal ideas, hallucinations, behavioral problems, confusion, sleep disturbance, dysphoric mood and decreased concentration. The patient is not hyperactive.        Objective:   Physical Exam  Constitutional: She appears well-developed. No distress.  HENT:  Head: Normocephalic.  Right Ear: External ear normal.  Left Ear: External ear normal.  Nose: Nose normal.  Mouth/Throat: Oropharynx is  clear and moist.  Eyes: Conjunctivae are normal. Pupils are equal, round, and reactive to light. Right eye exhibits no discharge. Left eye exhibits no discharge.  Neck: Normal range of motion. Neck supple. No JVD present. No tracheal deviation present. No thyromegaly present.  Cardiovascular: Normal rate, regular rhythm and normal heart sounds.   Pulmonary/Chest: No stridor. No respiratory distress. She has no wheezes.  Abdominal: Soft. Bowel sounds are normal. She exhibits no distension and no mass. There is no tenderness. There is no rebound and no guarding.  Musculoskeletal: She exhibits no edema and no tenderness.  Lymphadenopathy:    She has no cervical adenopathy.  Neurological: She displays normal reflexes. No cranial nerve deficit. She exhibits normal muscle tone. Coordination normal.  Skin: No rash noted. No erythema.  Psychiatric: She has a normal mood and affect. Her behavior is normal. Judgment and thought content normal.   L foot NT, trace dorso-lat puffiness  Lab Results  Component Value Date   WBC 7.2 04/27/2013   HGB 8.2* 04/27/2013   HCT 24.3* 04/27/2013   PLT 217 04/27/2013   GLUCOSE 109* 04/22/2013   CHOL 162 11/24/2012   TRIG 64.0 11/24/2012   HDL 47.20 11/24/2012   LDLCALC 102* 11/24/2012   ALT 22 11/24/2012   AST 21 11/24/2012   NA 136 04/22/2013   K 3.8 04/22/2013   CL 102 04/22/2013   CREATININE 0.70 04/22/2013   BUN 10 04/22/2013   CO2 28 04/22/2013   TSH 0.60 11/24/2012   INR 0.96 05/09/2011          Assessment & Plan:

## 2013-08-08 IMAGING — CR DG LUMBAR SPINE COMPLETE 4+V
5 series · 5 of 5 positions shown · non-contrast
Comparison: 08/03/2012

CLINICAL DATA: Back pain

LUMBAR SPINE - COMPLETE 4+ VIEW

[t lumbar spine ap]
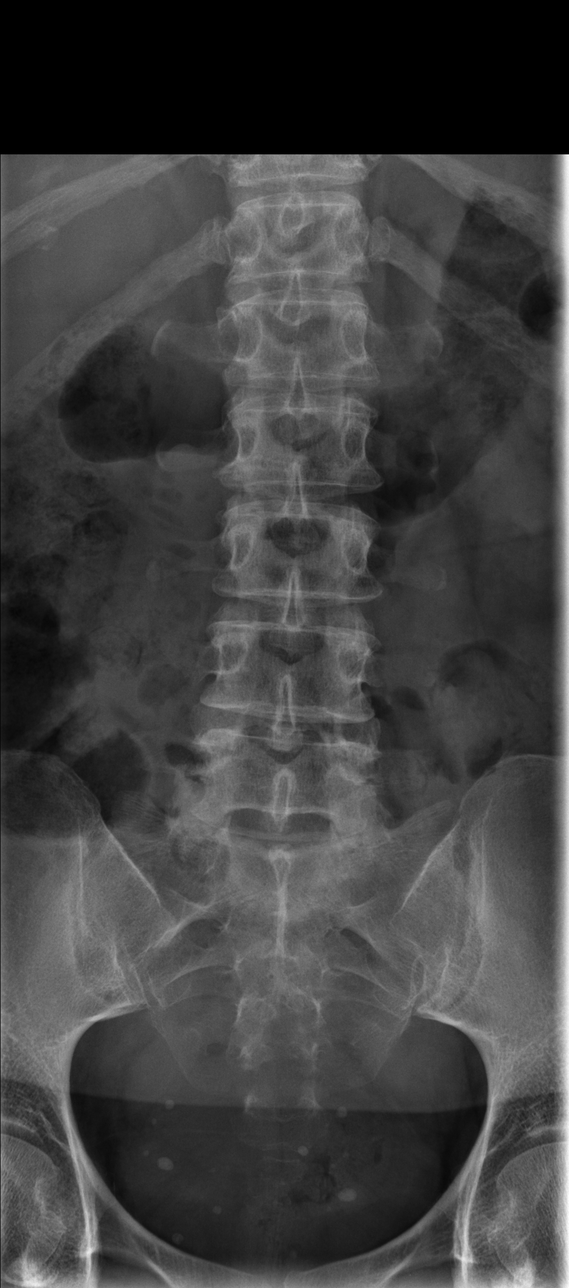

[t lumbar spine obl (1 of 2)]
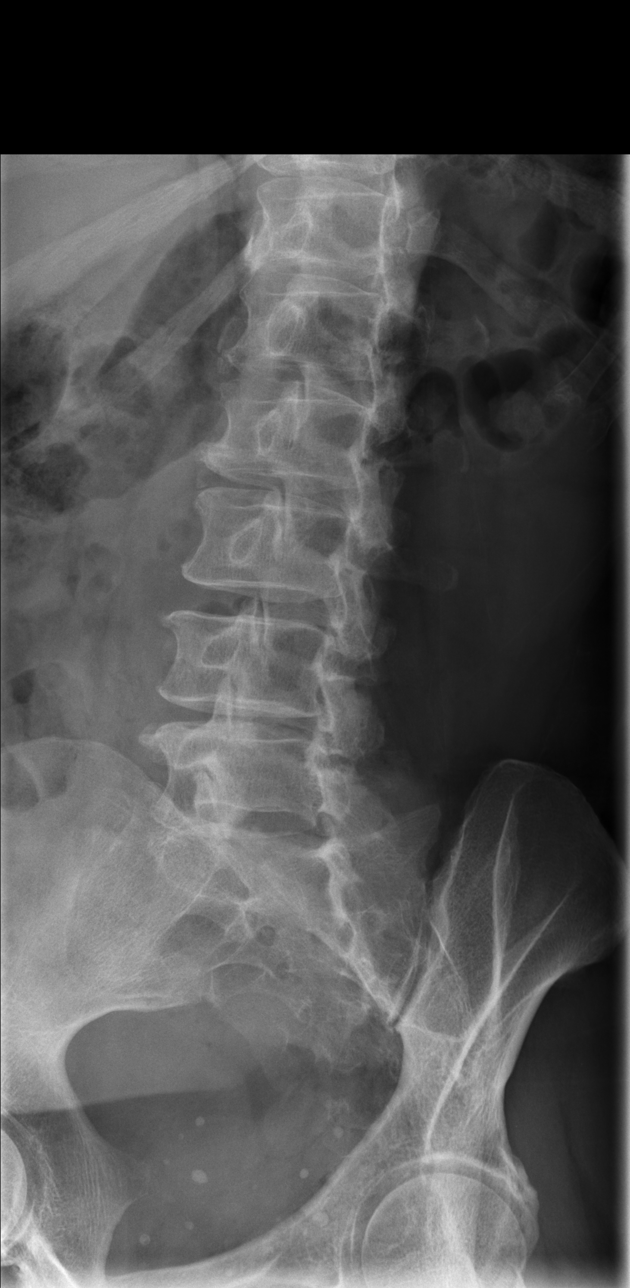

[t lumbar spine obl (2 of 2)]
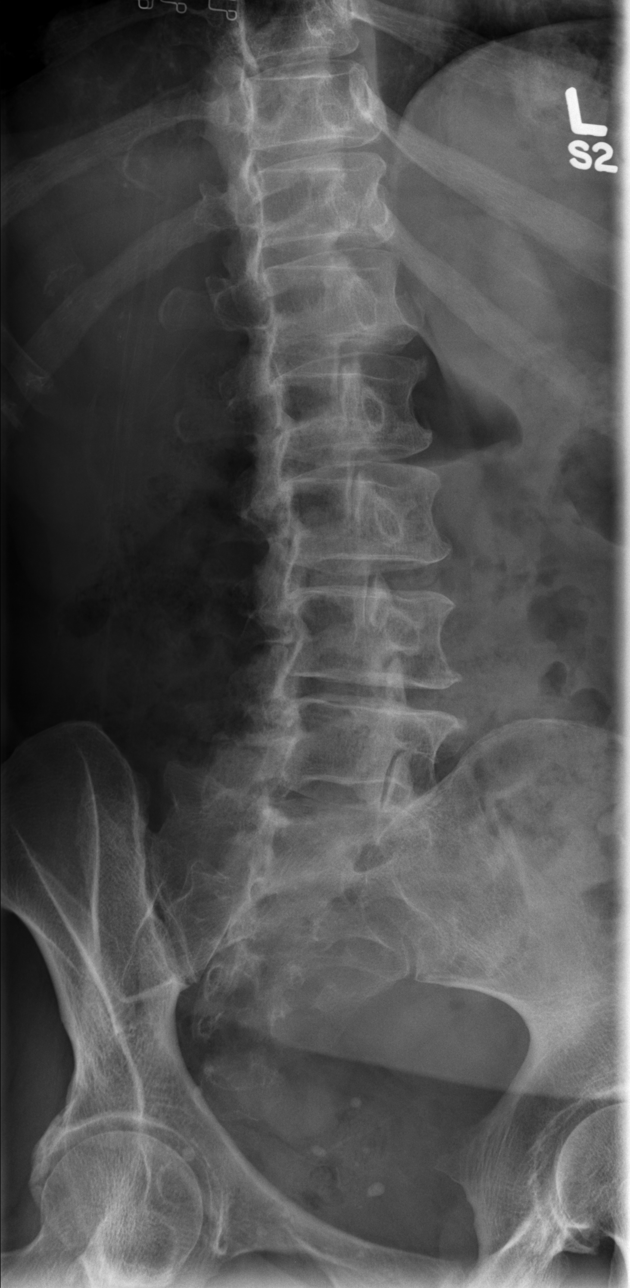

[t lumbar spine lat]
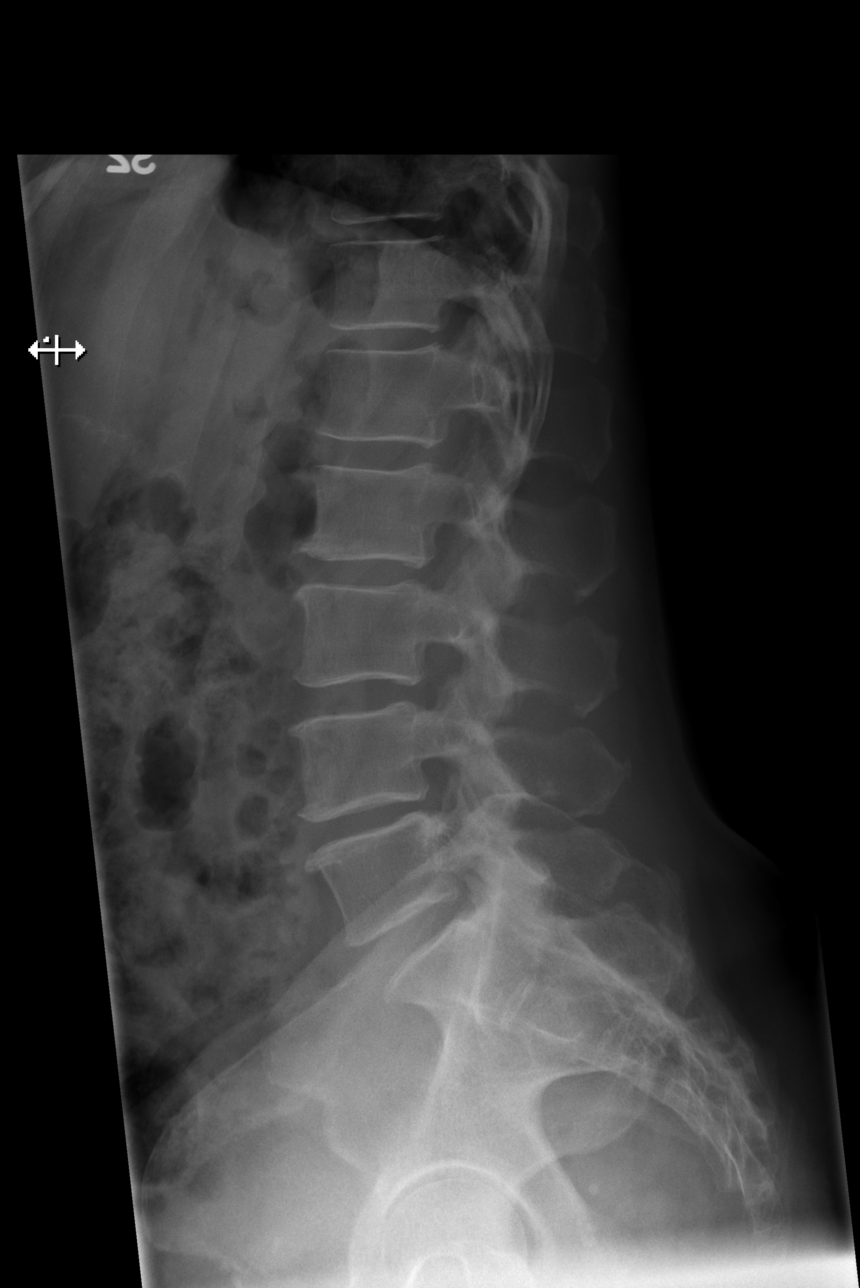

[t lumbar l-5 s-1 spot]
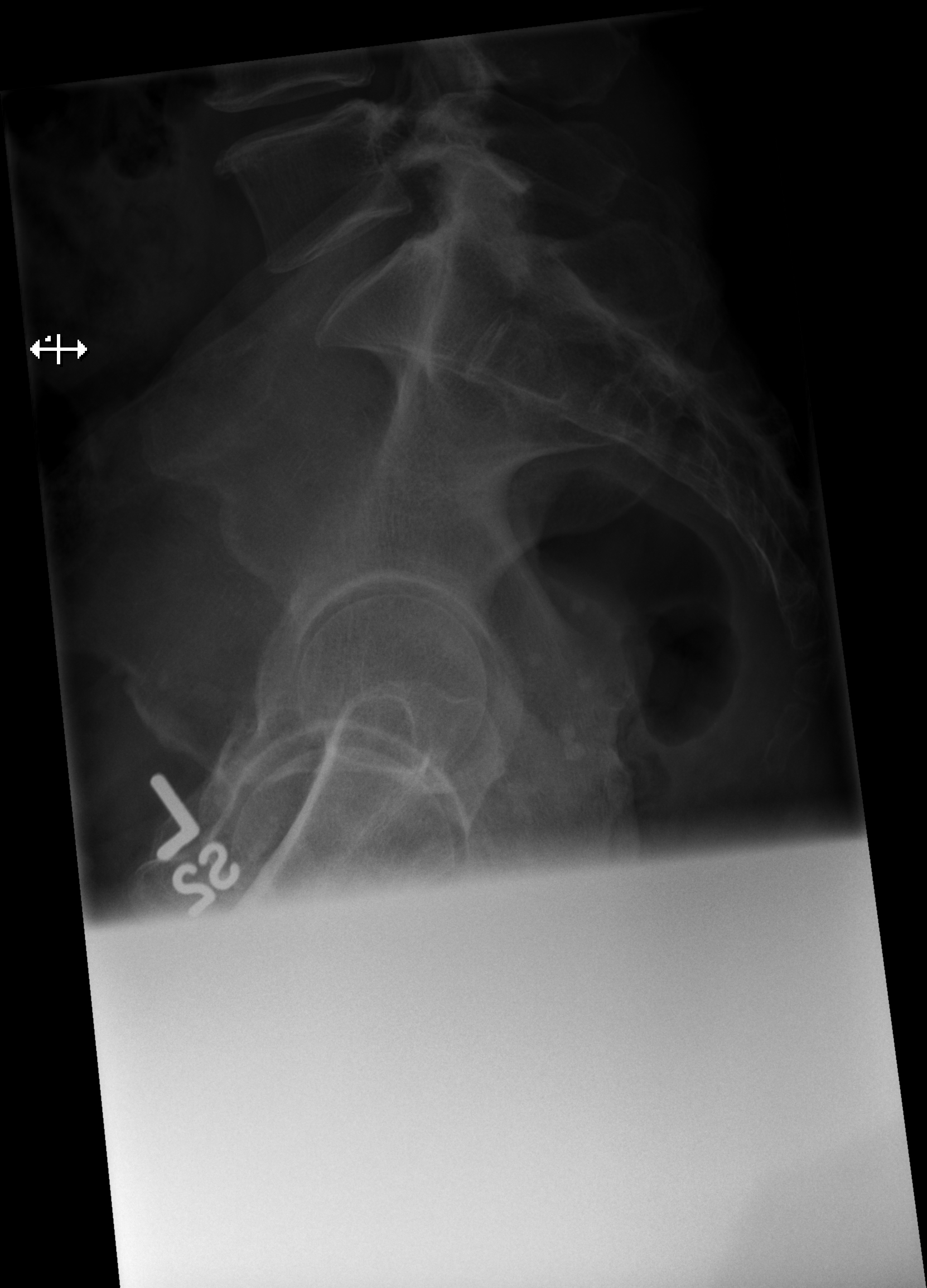

[5 of 5 positions shown; findings below may reference images not displayed]

FINDINGS: Mild endplate degenerative changes.  Normal alignment
without compression fracture, wedge shaped deformity or focal
kyphosis.  No pars defects.  Normal pedicles and SI joints.
Calcified pelvic venous phleboliths noted.  Nonobstructive bowel
gas pattern.
IMPRESSION: Mild degenerative changes.  No acute finding or interval change.

## 2013-09-03 ENCOUNTER — Other Ambulatory Visit: Payer: Self-pay | Admitting: Internal Medicine

## 2013-09-11 ENCOUNTER — Other Ambulatory Visit: Payer: Self-pay | Admitting: Internal Medicine

## 2013-11-24 ENCOUNTER — Other Ambulatory Visit (INDEPENDENT_AMBULATORY_CARE_PROVIDER_SITE_OTHER): Payer: BC Managed Care – PPO

## 2013-11-24 ENCOUNTER — Encounter: Payer: Self-pay | Admitting: Internal Medicine

## 2013-11-24 ENCOUNTER — Ambulatory Visit (INDEPENDENT_AMBULATORY_CARE_PROVIDER_SITE_OTHER): Payer: BC Managed Care – PPO | Admitting: Internal Medicine

## 2013-11-24 VITALS — BP 130/92 | HR 80 | Temp 98.9°F | Resp 16 | Wt 169.0 lb

## 2013-11-24 DIAGNOSIS — I2510 Atherosclerotic heart disease of native coronary artery without angina pectoris: Secondary | ICD-10-CM

## 2013-11-24 DIAGNOSIS — I10 Essential (primary) hypertension: Secondary | ICD-10-CM

## 2013-11-24 DIAGNOSIS — K625 Hemorrhage of anus and rectum: Secondary | ICD-10-CM

## 2013-11-24 DIAGNOSIS — K219 Gastro-esophageal reflux disease without esophagitis: Secondary | ICD-10-CM

## 2013-11-24 DIAGNOSIS — E876 Hypokalemia: Secondary | ICD-10-CM

## 2013-11-24 LAB — BASIC METABOLIC PANEL
BUN: 11 mg/dL (ref 6–23)
CO2: 26 mEq/L (ref 19–32)
CREATININE: 0.7 mg/dL (ref 0.4–1.2)
Calcium: 9.4 mg/dL (ref 8.4–10.5)
Chloride: 104 mEq/L (ref 96–112)
GFR: 108.98 mL/min (ref >60.00)
GLUCOSE: 92 mg/dL (ref 70–99)
POTASSIUM: 3.9 meq/L (ref 3.5–5.1)
Sodium: 138 mEq/L (ref 135–145)

## 2013-11-24 LAB — CBC WITH DIFFERENTIAL/PLATELET
BASOS ABS: 0 10*3/uL (ref 0.0–0.1)
Basophils Relative: 0.7 % (ref 0.0–3.0)
Eosinophils Absolute: 0.1 10*3/uL (ref 0.0–0.7)
Eosinophils Relative: 3.5 % (ref 0.0–5.0)
HEMATOCRIT: 37.8 % (ref 36.0–46.0)
Hemoglobin: 12.5 g/dL (ref 12.0–15.0)
Lymphocytes Relative: 39.8 % (ref 12.0–46.0)
Lymphs Abs: 1.2 10*3/uL (ref 0.7–4.0)
MCHC: 33 g/dL (ref 30.0–36.0)
MCV: 99.3 fl (ref 78.0–100.0)
MONO ABS: 0.3 10*3/uL (ref 0.1–1.0)
Monocytes Relative: 10 % (ref 3.0–12.0)
NEUTROS PCT: 46 % (ref 43.0–77.0)
Neutro Abs: 1.4 10*3/uL (ref 1.4–7.7)
PLATELETS: 246 10*3/uL (ref 150.0–400.0)
RBC: 3.81 Mil/uL — ABNORMAL LOW (ref 3.87–5.11)
RDW: 13 % (ref 11.5–14.6)
WBC: 3 10*3/uL — ABNORMAL LOW (ref 4.5–10.5)

## 2013-11-24 LAB — HEPATIC FUNCTION PANEL
ALT: 21 U/L (ref 0–35)
AST: 18 U/L (ref 0–37)
Albumin: 3.6 g/dL (ref 3.5–5.2)
Alkaline Phosphatase: 62 U/L (ref 39–117)
BILIRUBIN DIRECT: 0 mg/dL (ref 0.0–0.3)
BILIRUBIN TOTAL: 0.6 mg/dL (ref 0.3–1.2)
TOTAL PROTEIN: 7.1 g/dL (ref 6.0–8.3)

## 2013-11-24 LAB — LIPID PANEL
CHOLESTEROL: 137 mg/dL (ref 0–200)
HDL: 43.3 mg/dL (ref >39.00)
LDL CALC: 75 mg/dL (ref 0–99)
Total CHOL/HDL Ratio: 3
Triglycerides: 92 mg/dL (ref 0.0–149.0)
VLDL: 18.4 mg/dL (ref 0.0–40.0)

## 2013-11-24 LAB — IBC PANEL
IRON: 111 ug/dL (ref 42–145)
Saturation Ratios: 26.2 % (ref 20.0–50.0)
TRANSFERRIN: 302.9 mg/dL (ref 212.0–360.0)

## 2013-11-24 LAB — TSH: TSH: 0.48 u[IU]/mL (ref 0.35–5.50)

## 2013-11-24 LAB — H. PYLORI ANTIBODY, IGG: H Pylori IgG: NEGATIVE

## 2013-11-24 MED ORDER — RANITIDINE HCL 150 MG PO TABS
150.0000 mg | ORAL_TABLET | Freq: Two times a day (BID) | ORAL | Status: DC
Start: 2013-11-24 — End: 2015-01-14

## 2013-11-24 MED ORDER — HYDROCORTISONE ACETATE 25 MG RE SUPP: 25.0000 mg | Freq: Two times a day (BID) | RECTAL | Status: DC

## 2013-11-24 NOTE — Progress Notes (Signed)
Pre visit review using our clinic review tool, if applicable. No additional management support is needed unless otherwise documented below in the visit note. 

## 2013-11-24 NOTE — Assessment & Plan Note (Addendum)
EKG Had some atypical CP

## 2013-11-24 NOTE — Assessment & Plan Note (Signed)
Continue with current prescription therapy as reflected on the Med list.  

## 2013-11-24 NOTE — Assessment & Plan Note (Signed)
Labs Zantac prn

## 2013-11-24 NOTE — Progress Notes (Signed)
  Subjective:    HPI  C/o passing rectal blood - chronic (Xyears off and on), worse lately - blood on a pad. H/o int. hemorrhoids: colon 2010.   C/o GERD w/occ CP and upper abd pain  The patient needs to address  chronic hypertension, cataracts  BP Readings from Last 3 Encounters:  11/24/13 130/92  06/16/13 144/98  04/29/13 120/63   Wt Readings from Last 3 Encounters:  11/24/13 169 lb (76.658 kg)  06/16/13 167 lb (75.751 kg)  04/26/13 164 lb (74.39 kg)      Review of Systems  Constitutional: Negative for fever, chills, diaphoresis, activity change, appetite change, fatigue and unexpected weight change.  HENT: Negative for congestion, dental problem, ear pain, hearing loss, mouth sores, postnasal drip, sinus pressure, sneezing, sore throat and voice change.   Eyes: Negative for pain and visual disturbance.  Respiratory: Negative for cough, chest tightness, wheezing and stridor.   Cardiovascular: Negative for chest pain, palpitations and leg swelling.  Gastrointestinal: Negative for nausea, vomiting, abdominal pain, blood in stool, abdominal distention and rectal pain.  Genitourinary: Negative for dysuria, hematuria, decreased urine volume, vaginal bleeding, vaginal discharge, difficulty urinating, vaginal pain and menstrual problem.  Musculoskeletal: Negative for back pain, gait problem, joint swelling and neck pain.  Skin: Negative for color change, rash and wound.  Neurological: Negative for dizziness, tremors, syncope, speech difficulty and light-headedness.  Hematological: Negative for adenopathy.  Psychiatric/Behavioral: Negative for suicidal ideas, hallucinations, behavioral problems, confusion, sleep disturbance, dysphoric mood and decreased concentration. The patient is not hyperactive.        Objective:   Physical Exam  Constitutional: She appears well-developed. No distress.  HENT:  Head: Normocephalic.  Right Ear: External ear normal.  Left Ear: External ear  normal.  Nose: Nose normal.  Mouth/Throat: Oropharynx is clear and moist.  Eyes: Conjunctivae are normal. Pupils are equal, round, and reactive to light. Right eye exhibits no discharge. Left eye exhibits no discharge.  Neck: Normal range of motion. Neck supple. No JVD present. No tracheal deviation present. No thyromegaly present.  Cardiovascular: Normal rate, regular rhythm and normal heart sounds.   Pulmonary/Chest: No stridor. No respiratory distress. She has no wheezes.  Abdominal: Soft. Bowel sounds are normal. She exhibits no distension and no mass. There is no tenderness. There is no rebound and no guarding.  Musculoskeletal: She exhibits no edema and no tenderness.  Lymphadenopathy:    She has no cervical adenopathy.  Neurological: She displays normal reflexes. No cranial nerve deficit. She exhibits normal muscle tone. Coordination normal.  Skin: No rash noted. No erythema.  Psychiatric: She has a normal mood and affect. Her behavior is normal. Judgment and thought content normal.   Lab Results  Component Value Date   WBC 7.2 04/27/2013   HGB 8.2* 04/27/2013   HCT 24.3* 04/27/2013   PLT 217 04/27/2013   GLUCOSE 97 06/16/2013   CHOL 162 11/24/2012   TRIG 64.0 11/24/2012   HDL 47.20 11/24/2012   LDLCALC 102* 11/24/2012   ALT 22 11/24/2012   AST 21 11/24/2012   NA 138 06/16/2013   K 3.4* 06/16/2013   CL 107 06/16/2013   CREATININE 0.7 06/16/2013   BUN 9 06/16/2013   CO2 28 06/16/2013   TSH 0.60 11/24/2012   INR 0.96 05/09/2011    EKG ok      Assessment & Plan:

## 2013-11-24 NOTE — Assessment & Plan Note (Signed)
Chronic and recurrent Dr Henrene Pastor (colon 2010)  Anusol [facility] Rx appt w/Dr Henrene Pastor

## 2013-11-26 ENCOUNTER — Telehealth: Payer: Self-pay | Admitting: Internal Medicine

## 2013-11-26 NOTE — Telephone Encounter (Signed)
Relevant patient education mailed to patient.  

## 2013-12-03 ENCOUNTER — Other Ambulatory Visit: Payer: Self-pay | Admitting: Internal Medicine

## 2013-12-15 ENCOUNTER — Encounter: Payer: Self-pay | Admitting: Cardiology

## 2013-12-15 ENCOUNTER — Ambulatory Visit (INDEPENDENT_AMBULATORY_CARE_PROVIDER_SITE_OTHER): Payer: BC Managed Care – PPO | Admitting: Cardiology

## 2013-12-15 VITALS — BP 150/90 | HR 79 | Ht 64.5 in | Wt 171.0 lb

## 2013-12-15 DIAGNOSIS — I2510 Atherosclerotic heart disease of native coronary artery without angina pectoris: Secondary | ICD-10-CM

## 2013-12-15 DIAGNOSIS — I10 Essential (primary) hypertension: Secondary | ICD-10-CM

## 2013-12-15 DIAGNOSIS — R0789 Other chest pain: Secondary | ICD-10-CM

## 2013-12-15 NOTE — Patient Instructions (Signed)
Your physician recommends that you continue on your current medications as directed. Please refer to the Current Medication list given to you today.  Your physician has requested that you have an exercise tolerance test. For further information please visit [email or web address]. Please also follow instruction sheet, as given.   Your physician recommends that you schedule a follow-up appointment as needed  

## 2013-12-15 NOTE — Progress Notes (Signed)
[name] [address], [place]  [address] Phone: [phone] Fax:  [phone]  Date:  12/15/2013   ID:  Kelly Santos, DOB 05-Oct-1945, MRN [id number]  PCP:  Kelly Kehr, MD   History of Present Illness: Kelly Santos is a 68 y.o. female here for occasional chest pain evaluation.has coronary artery disease by history. May be sharp at times, fleeting. Left side of body feels a little funny. If has pain in leg sometimes can move to left chest. Left leg can cramp at time. Felt a fullness wrapping to back. Does not stop her from activity.   No TOB. No DM. Father with "some" heart disease.   Had heart cath in 2002 - "trivial" non obstructive CAD.    Wt Readings from Last 3 Encounters:  12/15/13 171 lb (77.565 kg)  11/24/13 169 lb (76.658 kg)  06/16/13 167 lb (75.751 kg)     Past Medical History  Diagnosis Date  . Hypertension   . Hyperthyroidism   . Hemorrhoids   . Hx of colonic polyps   . Anemia   . GERD (gastroesophageal reflux disease)   . Diverticulosis   . Osteoarthritis   . Vitamin D deficiency   . Uterine polyp   . Generalized headaches   . Hiatal hernia     On CT done Jan 31, 2011   . Glaucoma   . CAD (coronary artery disease)     no cardiologist, followed by PCP only    Past Surgical History  Procedure Laterality Date  . Dilation and curettage of uterus  01/18/2013    with uterine polypectomy  . Abdominal hysterectomy N/A 04/26/2013    Procedure: HYSTERECTOMY ABDOMINAL;  Surgeon: Kelly Mourning, MD;  Location: [facility];  Service: Gynecology;  Laterality: N/A;  . Salpingoophorectomy Bilateral 04/26/2013    Procedure: SALPINGO OOPHORECTOMY;  Surgeon: Kelly Mourning, MD;  Location: [facility];  Service: Gynecology;  Laterality: Bilateral;    Current Outpatient Prescriptions  Medication Sig Dispense Refill  . amitriptyline (ELAVIL) 25 MG tablet Take 25 mg by mouth daily as needed for sleep or pain.      Marland Kitchen amLODipine-benazepril (LOTREL) 5-40  MG per capsule TAKE ONE CAPSULE BY MOUTH EVERY DAY  30 capsule  11  . brimonidine-timolol (COMBIGAN) 0.2-0.5 % ophthalmic solution Place 1 drop into both eyes 2 (two) times daily.      . cholecalciferol (VITAMIN D) 1000 UNITS tablet Take 1,000 Units by mouth daily.      . hydrocortisone (ANUSOL-HC) 25 MG suppository Place 1 suppository (25 mg total) rectally 2 (two) times daily.  20 suppository  1  . ibuprofen (ADVIL,MOTRIN) 600 MG tablet Take 1 tablet (600 mg total) by mouth every 6 (six) hours as needed (mild pain).  60 tablet  1  . ipratropium (ATROVENT) 0.03 % nasal spray Place 2 sprays into the nose 2 (two) times daily.  30 mL  0  . potassium chloride SA (K-DUR,KLOR-CON) 20 MEQ tablet Take 20 mEq by mouth daily.      . ranitidine (ZANTAC) 150 MG tablet Take 1 tablet (150 mg total) by mouth 2 (two) times daily.  60 tablet  3  . triamcinolone cream (KENALOG) 0.5 % APPLY TWICE A DAY AS NEEDED  30 g  0   No current facility-administered medications for this visit.    Allergies:   No Known Allergies  Social History:  The patient  reports that she has never smoked.  She has never used smokeless tobacco. She reports that she does not drink alcohol or use illicit drugs.   Family History  Problem Relation Age of Onset  . Colon cancer Neg Hx   . Hypertension Mother   . Hypertension Father   . Deep vein thrombosis Son 35    x 2, chronic coumadin    ROS:  Please see the history of present illness.   Denies any strokelike symptoms, fevers, chills, syncope, orthopnea, PND, rash   All other systems reviewed and negative.   PHYSICAL EXAM: VS:  BP 150/90  Pulse 79  Ht 5' 4.5" (1.638 m)  Wt 171 lb (77.565 kg)  BMI 28.91 kg/m2 Well nourished, well developed, in no acute distress HEENT: normal, [place]/AT, EOMI Neck: no JVD, normal carotid upstroke, no bruit Cardiac:  normal S1, S2; RRR; no murmur Lungs:  clear to auscultation bilaterally, no wheezing, rhonchi or rales Abd: soft, nontender, no  hepatomegaly, no bruits Ext: no edema, 2+ distal pulses Skin: warm and dry GU: deferred Neuro: no focal abnormalities noted, AAO x 3  EKG:  Sinus rhythm, nonspecific ST flattening.     ASSESSMENT AND PLAN:  1. Atypical chest pain-possibly musculoskeletal related, GERD related. Given her risk factor of hypertension, age we will proceed with exercise treadmill test. Test discussed, she is willing to proceed. Excellent LDL cholesterol of 75. Excellent distal pulses/no evidence of peripheral vascular disease given her cramping history. Encouraged exercise. Continue with potassium rich foods such as banana. In the past her potassium was 3.4 but now is 3.9. Heart catheterization in 2002 showed only mild luminal irregularities. 2. Hypertension-mildly elevated today. Continue with current regimen but closely monitor. May need further agents.  Signed, Kelly Furbish, MD [facility]  12/15/2013 12:38 PM

## 2013-12-22 ENCOUNTER — Telehealth (HOSPITAL_COMMUNITY): Payer: Self-pay

## 2013-12-23 ENCOUNTER — Telehealth (HOSPITAL_COMMUNITY): Payer: Self-pay

## 2013-12-24 ENCOUNTER — Ambulatory Visit (HOSPITAL_COMMUNITY)
Admission: RE | Admit: 2013-12-24 | Discharge: 2013-12-24 | Disposition: A | Discharge status: Home / Self Care | Payer: BC Managed Care – PPO | Source: Ambulatory Visit | Attending: Cardiology | Admitting: Cardiology

## 2013-12-24 DIAGNOSIS — R0789 Other chest pain: Secondary | ICD-10-CM | POA: Diagnosis present

## 2013-12-31 ENCOUNTER — Telehealth: Payer: Self-pay | Admitting: Cardiology

## 2013-12-31 DIAGNOSIS — R079 Chest pain, unspecified: Secondary | ICD-10-CM

## 2013-12-31 NOTE — Telephone Encounter (Signed)
NUC test order placed, patient notified that scheduling will call to schedule appointment for test.

## 2014-01-01 ENCOUNTER — Ambulatory Visit (INDEPENDENT_AMBULATORY_CARE_PROVIDER_SITE_OTHER): Payer: BC Managed Care – PPO | Admitting: Emergency Medicine

## 2014-01-01 VITALS — BP 126/78 | HR 69 | Temp 98.3°F | Resp 18 | Ht 65.0 in | Wt 169.0 lb

## 2014-01-01 DIAGNOSIS — H1130 Conjunctival hemorrhage, unspecified eye: Secondary | ICD-10-CM

## 2014-01-01 DIAGNOSIS — H5789 Other specified disorders of eye and adnexa: Secondary | ICD-10-CM

## 2014-01-01 NOTE — Patient Instructions (Signed)
You have  a subconjunctival hemorrhage of the eye.Kelly Santos Please use the Lacri-Lube ointment. If You  starts to have any difficulty with your vision please return to clinic for immediate evaluation

## 2014-01-01 NOTE — Progress Notes (Signed)
Subjective:    Patient ID: Kelly Santos, female    DOB: [birth year], 68 y.o.   MRN: [id number]  HPI  Chief Complaint  Patient presents with  . eye issues    redness since yesterday-lt    This chart was scribed for Arlyss Queen, MD by Thea Alken, ED Scribe. This patient was seen in room 13 and the patient's care was started at 9:57 AM.  HPI Comments: Kelly Santos is a 68 y.o. female who presents to the [facility] complaining of an erythematous left eye onset 1 day ago. Pt denies eye being itchy but does states that she has minor pain in the left eye. She states that it feels like there is something stuck in her eye. Pt reports that she it is concern that she may have pink eye. pt denies being on blood thinners or wearing contacts   Past Medical History  Diagnosis Date  . Hypertension   . Hyperthyroidism   . Hemorrhoids   . Hx of colonic polyps   . Anemia   . GERD (gastroesophageal reflux disease)   . Diverticulosis   . Osteoarthritis   . Vitamin D deficiency   . Uterine polyp   . Generalized headaches   . Hiatal hernia     On CT done Jan 31, 2011   . Glaucoma   . CAD (coronary artery disease)     no cardiologist, followed by PCP only   No Known Allergies Prior to Admission medications   Medication Sig Start Date End Date Taking? Authorizing Provider  amitriptyline (ELAVIL) 25 MG tablet Take 25 mg by mouth daily as needed for sleep or pain.   Yes Historical Provider, MD  amLODipine-benazepril (LOTREL) 5-40 MG per capsule TAKE ONE CAPSULE BY MOUTH EVERY DAY   Yes Aleksei V Plotnikov, MD  brimonidine-timolol (COMBIGAN) 0.2-0.5 % ophthalmic solution Place 1 drop into both eyes 2 (two) times daily.   Yes Historical Provider, MD  cholecalciferol (VITAMIN D) 1000 UNITS tablet Take 1,000 Units by mouth daily.   Yes Historical Provider, MD  ibuprofen (ADVIL,MOTRIN) 600 MG tablet Take 1 tablet (600 mg total) by mouth every 6 (six) hours as needed (mild pain).  06/16/13  Yes Evie Lacks Plotnikov, MD  ipratropium (ATROVENT) 0.03 % nasal spray Place 2 sprays into the nose 2 (two) times daily. 04/28/13  Yes Cyril Mourning, MD  potassium chloride SA (K-DUR,KLOR-CON) 20 MEQ tablet Take 20 mEq by mouth daily.   Yes Historical Provider, MD  triamcinolone cream (KENALOG) 0.5 % APPLY TWICE A DAY AS NEEDED 09/03/13  Yes Cassandria Anger, MD  hydrocortisone (ANUSOL-HC) 25 MG suppository Place 1 suppository (25 mg total) rectally 2 (two) times daily. 11/24/13   Evie Lacks Plotnikov, MD  ranitidine (ZANTAC) 150 MG tablet Take 1 tablet (150 mg total) by mouth 2 (two) times daily. 11/24/13   Cassandria Anger, MD     Review of Systems  Eyes: Positive for redness. Negative for photophobia, itching and visual disturbance.      Objective:   Physical Exam CONSTITUTIONAL: Well developed/well nourished HEAD: Normocephalic/atraumatic EYES: EOMI/PERRL there is redness medial portion of the left. The cornea itself is normal. At this margins are normal. There is no evidence of vitreous hemorrhage. The lid was everted and no foreign body was seen to ENMT: Mucous membranes moist NECK: supple no meningeal signs SPINE:entire spine nontender CV: S1/S2 noted, no murmurs/rubs/gallops noted LUNGS: Lungs are clear to auscultation bilaterally, no  apparent distress ABDOMEN: soft, nontender, no rebound or guarding GU:no cva tenderness NEURO: Pt is awake/alert, moves all extremitiesx4 EXTREMITIES: pulses normal, full ROM SKIN: warm, color normal PSYCH: no abnormalities of mood noted     Assessment & Plan:  Please use Lacri-Lube ointment. Recheck if any problems with her vision I personally performed the services described in this documentation, which was scribed in my presence. The recorded information has been reviewed and is accurate.

## 2014-01-19 ENCOUNTER — Encounter: Payer: Self-pay | Admitting: Cardiovascular Disease

## 2014-01-24 ENCOUNTER — Encounter: Payer: Self-pay | Admitting: Internal Medicine

## 2014-01-24 ENCOUNTER — Ambulatory Visit (HOSPITAL_COMMUNITY): Payer: BC Managed Care – PPO | Attending: Internal Medicine | Admitting: Radiology

## 2014-01-24 VITALS — BP 135/90 | Ht 64.5 in | Wt 166.0 lb

## 2014-01-24 DIAGNOSIS — I2510 Atherosclerotic heart disease of native coronary artery without angina pectoris: Secondary | ICD-10-CM | POA: Insufficient documentation

## 2014-01-24 DIAGNOSIS — R002 Palpitations: Secondary | ICD-10-CM | POA: Insufficient documentation

## 2014-01-24 DIAGNOSIS — R079 Chest pain, unspecified: Secondary | ICD-10-CM | POA: Insufficient documentation

## 2014-01-24 DIAGNOSIS — Z8249 Family history of ischemic heart disease and other diseases of the circulatory system: Secondary | ICD-10-CM | POA: Insufficient documentation

## 2014-01-24 DIAGNOSIS — I10 Essential (primary) hypertension: Secondary | ICD-10-CM | POA: Insufficient documentation

## 2014-01-24 DIAGNOSIS — I4949 Other premature depolarization: Secondary | ICD-10-CM

## 2014-01-24 MED ORDER — TECHNETIUM TC 99M SESTAMIBI GENERIC - CARDIOLITE
30.0000 | Freq: Once | INTRAVENOUS | Status: AC | PRN
  Administered 2014-01-24: 30 via INTRAVENOUS

## 2014-01-24 MED ORDER — TECHNETIUM TC 99M SESTAMIBI GENERIC - CARDIOLITE
10.0000 | Freq: Once | INTRAVENOUS | Status: AC | PRN
  Administered 2014-01-24: 10 via INTRAVENOUS

## 2014-01-24 NOTE — Progress Notes (Signed)
[facility] [address] [place], [place] [address] [phone]    Cardiology Nuclear Med Study  Kelly Santos is a 68 y.o. female     MRN : [id number]     DOB: Jun 08, 1946  Procedure Date: 01/24/2014  Nuclear Med Background Indication for Stress Test:  Evaluation for Ischemia History:  '02 N/O CAD 06/2007 Adenosine EF: 60% 2012 ECHO: 55-60%  Cardiac Risk Factors: Family History - CAD, Hypertension and Lipids  Symptoms:  Chest Pain and Palpitations   Nuclear Pre-Procedure Caffeine/Decaff Intake:  None > 12 hrs NPO After: 6:00pm   Lungs:  clear O2 Sat: 97% on room air. IV 0.9% NS with Angio Cath:  22g  IV Site: R Antecubital x 1, tolerated well IV Started by:  Irven Baltimore, RN  Chest Size (in):  38 Cup Size: C  Height: 5' 4.5" (1.638 m)  Weight:  166 lb (75.297 kg)  BMI:  Body mass index is 28.06 kg/(m^2). Tech Comments:  Patient took Lotrel this am. Irven Baltimore, RN.    Nuclear Med Study 1 or 2 day study: 1 day  Stress Test Type:  Stress  Reading MD: N/A  Order Authorizing Provider:  Candee Furbish, MD  Resting Radionuclide: Technetium 70m Sestamibi  Resting Radionuclide Dose: 11.0 mCi   Stress Radionuclide:  Technetium 84m Sestamibi  Stress Radionuclide Dose: 33.0 mCi           Stress Protocol Rest HR: 56 Stress HR: 134  Rest BP: 135/90 Stress BP: 186/99  Exercise Time (min): 9:00 METS: 10.10   Predicted Max HR: 153 bpm % Max HR: 87.58 bpm Rate Pressure Product: 24924   Dose of Adenosine (mg):  n/a Dose of Lexiscan: n/a mg  Dose of Atropine (mg): n/a Dose of Dobutamine: n/a mcg/kg/min (at max HR)  Stress Test Technologist: Perrin Maltese, EMT-P  Nuclear Technologist:  Charlton Amor, CNMT     Rest Procedure:  Myocardial perfusion imaging was performed at rest 45 minutes following the intravenous administration of Technetium 51m Sestamibi. Rest ECG: NSR-LVH nonspecific ST segment changes  Stress Procedure:  The patient exercised on  the treadmill utilizing the Bruce Protocol for 9:00 minutes. The patient stopped due to fatigue and denied any chest pain.  Technetium 39m Sestamibi was injected at peak exercise and myocardial perfusion imaging was performed after a brief delay. Stress ECG: Accentuation of nonspecific ST changes with horizontal 2 mm ST segment depression most notably in V6 at peak stress, resolved promptly in recovery.  QPS Raw Data Images:  Normal; no motion artifact; normal heart/lung ratio. Stress Images:  Normal homogeneous uptake in all areas of the myocardium. Rest Images:  Normal homogeneous uptake in all areas of the myocardium. Subtraction (SDS):  No evidence of ischemia. Transient Ischemic Dilatation (Normal <1.22):  1.03 Lung/Heart Ratio (Normal <0.45):  0.32  Quantitative Gated Spect Images QGS EDV:  99 ml QGS ESV:  37 ml  Impression Exercise Capacity:  Good exercise capacity. BP Response:  Hypertensive blood pressure response. Clinical Symptoms:  No significant symptoms noted. ECG Impression:  Abnormal. ST segment depression in V6. PVCs Comparison with Prior Nuclear Study: No images to compare  Overall Impression:  Low risk stress nuclear study with no perfusion defects, however ST segment changes are noted on exercise treadmill portion of study, likely false positive..  LV Ejection Fraction: 62%.  LV Wall Motion:  NL LV Function; NL Wall Motion. Continue to treat hypertension. If symptoms worsen or become more worrisome,  further cardiac testing may be warranted.  Candee Furbish, MD

## 2014-01-26 ENCOUNTER — Encounter: Payer: Self-pay | Admitting: Internal Medicine

## 2014-01-26 ENCOUNTER — Ambulatory Visit (INDEPENDENT_AMBULATORY_CARE_PROVIDER_SITE_OTHER): Payer: BC Managed Care – PPO | Admitting: Internal Medicine

## 2014-01-26 VITALS — BP 120/78 | HR 64 | Ht 64.5 in | Wt 170.0 lb

## 2014-01-26 DIAGNOSIS — K648 Other hemorrhoids: Secondary | ICD-10-CM

## 2014-01-26 DIAGNOSIS — K649 Unspecified hemorrhoids: Secondary | ICD-10-CM

## 2014-01-26 DIAGNOSIS — I2510 Atherosclerotic heart disease of native coronary artery without angina pectoris: Secondary | ICD-10-CM

## 2014-01-26 DIAGNOSIS — K625 Hemorrhage of anus and rectum: Secondary | ICD-10-CM

## 2014-01-26 MED ORDER — HYDROCORTISONE ACETATE 25 MG RE SUPP: 25.0000 mg | Freq: Two times a day (BID) | RECTAL | Status: DC

## 2014-01-26 NOTE — Progress Notes (Signed)
HISTORY OF PRESENT ILLNESS:  Kelly Santos is a 68 y.o. female with multiple medical problems as listed below. She presents today regarding intermittent rectal bleeding. Patient has had a history of rectal bleeding for which he has been evaluated previously. Diagnosed with hemorrhoids. Current history is that of intermittent rectal bleeding x6 months. Red blood noted on the tissue and in the toilet bowl. Normal stools. No rectal pain. No abdominal pain. No weight loss. She did undergo complete colonoscopy October 2010. She was found to have diverticulosis, non-adenomatous polyp, and internal hemorrhoids. Followup in 10 years recommended. She recently saw Dr. Alain Santos for rectal bleeding. No rectal exam. Prescribe Anusol suppositories. This symptom has improved. CBC 11/24/2013 revealed normal hemoglobin of 12.5.  REVIEW OF SYSTEMS:  All non-GI ROS negative . Past Medical History  Diagnosis Date  . Hypertension   . Hyperthyroidism   . Hemorrhoids   . Hx of colonic polyps   . Anemia   . GERD (gastroesophageal reflux disease)   . Diverticulosis   . Osteoarthritis   . Vitamin D deficiency   . Uterine polyp   . Generalized headaches   . Hiatal hernia     On CT done Jan 31, 2011   . Glaucoma   . CAD (coronary artery disease)     no cardiologist, followed by PCP only    Past Surgical History  Procedure Laterality Date  . Dilation and curettage of uterus  01/18/2013    with uterine polypectomy  . Abdominal hysterectomy N/A 04/26/2013    Procedure: HYSTERECTOMY ABDOMINAL;  Surgeon: Kelly Mourning, MD;  Location: [facility];  Service: Gynecology;  Laterality: N/A;  . Salpingoophorectomy Bilateral 04/26/2013    Procedure: SALPINGO OOPHORECTOMY;  Surgeon: Kelly Mourning, MD;  Location: [facility];  Service: Gynecology;  Laterality: Bilateral;    Social History Kelly Santos  reports that she has never smoked. She has never used smokeless tobacco. She reports that she does not drink alcohol or use  illicit drugs.  family history includes Deep vein thrombosis (age of onset: 18) in her son; Hypertension in her father and mother. There is no history of Colon cancer.  No Known Allergies     PHYSICAL EXAMINATION: Vital signs: BP 120/78  Pulse 64  Ht 5' 4.5" (1.638 m)  Wt 170 lb (77.111 kg)  BMI 28.74 kg/m2  Constitutional: generally well-appearing, no acute distress Psychiatric: alert and oriented x3, cooperative Abdomen: soft, obese nontender, nondistended Rectal: Moderate prolapsing hemorrhoids which were slightly friable. Hemoccult negative stool, but nontender Neuro: No focal deficits.   ASSESSMENT:  #1. Rectal bleeding secondary to internal hemorrhoids #2. Colonoscopy 2010 with diverticulosis and internal hemorrhoids   PLAN:  #1. Metamucil 1-2 tablespoons daily #2. Anusol suppositories. Prescribed. Use as needed #3. Due for routine colonoscopy 2020 #4. GI followup as needed

## 2014-01-26 NOTE — Patient Instructions (Signed)
Use Metamucil daily- 2 tablespoons mixed with water or juice  We have sent the following medications to your pharmacy for you to pick up at your convenience:  Anusol [facility] Suppositories   Please follow up with Dr. Henrene Pastor as needed

## 2014-03-11 ENCOUNTER — Other Ambulatory Visit: Payer: Self-pay | Admitting: Internal Medicine

## 2014-03-30 NOTE — Telephone Encounter (Signed)
Encounter complete. 

## 2014-04-14 NOTE — Telephone Encounter (Signed)
Encounter complete. 

## 2014-04-29 ENCOUNTER — Other Ambulatory Visit: Payer: Self-pay | Admitting: Obstetrics and Gynecology

## 2014-05-02 LAB — CYTOLOGY - PAP

## 2014-09-09 ENCOUNTER — Other Ambulatory Visit: Payer: Self-pay | Admitting: Internal Medicine

## 2014-09-19 ENCOUNTER — Other Ambulatory Visit: Payer: Self-pay | Admitting: Internal Medicine

## 2014-11-05 ENCOUNTER — Other Ambulatory Visit: Payer: Self-pay | Admitting: Internal Medicine

## 2014-11-30 ENCOUNTER — Other Ambulatory Visit: Payer: Self-pay | Admitting: Internal Medicine

## 2014-12-01 ENCOUNTER — Other Ambulatory Visit: Payer: Self-pay | Admitting: Internal Medicine

## 2014-12-07 ENCOUNTER — Other Ambulatory Visit: Payer: Self-pay | Admitting: Internal Medicine

## 2015-01-14 ENCOUNTER — Ambulatory Visit (INDEPENDENT_AMBULATORY_CARE_PROVIDER_SITE_OTHER): Payer: BLUE CROSS/BLUE SHIELD | Admitting: Emergency Medicine

## 2015-01-14 ENCOUNTER — Ambulatory Visit (INDEPENDENT_AMBULATORY_CARE_PROVIDER_SITE_OTHER): Payer: BLUE CROSS/BLUE SHIELD

## 2015-01-14 VITALS — BP 130/84 | HR 75 | Temp 98.1°F | Ht 64.0 in | Wt 172.0 lb

## 2015-01-14 DIAGNOSIS — M5441 Lumbago with sciatica, right side: Secondary | ICD-10-CM

## 2015-01-14 LAB — POCT URINALYSIS DIPSTICK
BILIRUBIN UA: NEGATIVE
Glucose, UA: NEGATIVE
KETONES UA: NEGATIVE
Nitrite, UA: NEGATIVE
Protein, UA: NEGATIVE
Spec Grav, UA: 1.01
Urobilinogen, UA: 0.2
pH, UA: 6

## 2015-01-14 LAB — POCT UA - MICROSCOPIC ONLY
Bacteria, U Microscopic: NEGATIVE
Casts, Ur, LPF, POC: NEGATIVE
Crystals, Ur, HPF, POC: NEGATIVE
Mucus, UA: NEGATIVE
RBC, urine, microscopic: NEGATIVE
Yeast, UA: NEGATIVE

## 2015-01-14 MED ORDER — CYCLOBENZAPRINE HCL 5 MG PO TABS: ORAL_TABLET | ORAL | Status: DC

## 2015-01-14 MED ORDER — MELOXICAM 7.5 MG PO TABS: 7.5000 mg | ORAL_TABLET | Freq: Every day | ORAL | Status: DC

## 2015-01-14 NOTE — Progress Notes (Signed)
Subjective:    Patient ID: Kelly Santos, female    DOB: 03-30-1946, 69 y.o.   MRN: [id number]  This chart was scribed for Arlyss Queen, MD by Irene Pap, ED Scribe. This patient was seen in room 12 and patient care was started at 2:09 PM.   HPI HPI Comments: Kelly Santos is a 69 y.o. female who presents to the [facility] complaining of lower lumbar pain onset two days ago. She states that she does not remember injuring her back. She states that she does some heavy lifting at work. 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 She reports dull, aggravating pain that sharpens with movement. She reports some pain shooting down her leg. She states that she took ibuprofen to some relief.She denies numbness or weakness, loss of bladder or bowel control.  She reports history of HTN. She denies any history of back injuries.   Review of Systems  Constitutional: Negative for fever and chills.  Gastrointestinal: Negative for diarrhea and constipation.  Genitourinary: Negative for dysuria and difficulty urinating.  Musculoskeletal: Positive for myalgias and back pain.  Neurological: Negative for weakness and numbness.      Objective:   Physical Exam  Constitutional: She appears well-developed and well-nourished. No distress.  HENT:  Head: Normocephalic and atraumatic.  Eyes: Conjunctivae are normal. Right eye exhibits no discharge. Left eye exhibits no discharge.  Neck: Neck supple.  Cardiovascular: Normal rate, regular rhythm and normal heart sounds.  Exam reveals no gallop and no friction rub.   No murmur heard. Pulmonary/Chest: Effort normal and breath sounds normal. No respiratory distress.  Abdominal: Soft. She exhibits no distension. There is no tenderness.  Genitourinary:  No flank tenderness  Musculoskeletal: She exhibits no edema.       Lumbar back: She exhibits tenderness.  Deep tendon reflexes knees, ankles 1+; pain with  SLR to 80 degrees on the right;motor strength 5/5  Neurological: She is alert.  Skin: Skin is warm and dry.  Psychiatric: She has a normal mood and affect. Her behavior is normal. Thought content normal.  Nursing note and vitals reviewed.    Results for orders placed or performed in visit on 01/14/15  POCT urinalysis dipstick  Result Value Ref Range   Color, UA yellow    Clarity, UA clear    Glucose, UA neg    Bilirubin, UA neg    Ketones, UA neg    Spec Grav, UA 1.010    Blood, UA trace-lysed    pH, UA 6.0    Protein, UA neg    Urobilinogen, UA 0.2    Nitrite, UA neg    Leukocytes, UA small (1+)   POCT UA - Microscopic Only  Result Value Ref Range   WBC, Ur, HPF, POC 0-1    RBC, urine, microscopic neg    Bacteria, U Microscopic neg    Mucus, UA neg    Epithelial cells, urine per micros 0-2    Crystals, Ur, HPF, POC neg    Casts, Ur, LPF, POC neg    Yeast, UA neg    [facility] reading (PRIMARY) by  Dr. Everlene Farrier LS-spine films show some minimal arthritis otherwise negative.   Assessment & Plan:  Patient has an LS strain. Will treat with Mobic for 10 days. She was also given some Flexeril low-dose to have at bedtime. Will give her instructions for back care.I personally performed the services described in this  documentation, which was scribed in my presence. The recorded information has been reviewed and is accurate.

## 2015-01-14 NOTE — Patient Instructions (Signed)
Back Pain, Adult Low back pain is very common. About 1 in 5 people have back pain.The cause of low back pain is rarely dangerous. The pain often gets better over time.About half of people with a sudden onset of back pain feel better in just 2 weeks. About 8 in 10 people feel better by 6 weeks.  CAUSES Some common causes of back pain include:  Strain of the muscles or ligaments supporting the spine.  Wear and tear (degeneration) of the spinal discs.  Arthritis.  Direct injury to the back. DIAGNOSIS Most of the time, the direct cause of low back pain is not known.However, back pain can be treated effectively even when the exact cause of the pain is unknown.Answering your caregiver's questions about your overall health and symptoms is one of the most accurate ways to make sure the cause of your pain is not dangerous. If your caregiver needs more information, he or she may order lab work or imaging tests (X-rays or MRIs).However, even if imaging tests show changes in your back, this usually does not require surgery. HOME CARE INSTRUCTIONS For many people, back pain returns.Since low back pain is rarely dangerous, it is often a condition that people can learn to manageon their own.   Remain active. It is stressful on the back to sit or stand in one place. Do not sit, drive, or stand in one place for more than 30 minutes at a time. Take short walks on level surfaces as soon as pain allows.Try to increase the length of time you walk each day.  Do not stay in bed.Resting more than 1 or 2 days can delay your recovery.  Do not avoid exercise or work.Your body is made to move.It is not dangerous to be active, even though your back may hurt.Your back will likely heal faster if you return to being active before your pain is gone.  Pay attention to your body when you bend and lift. Many people have less discomfortwhen lifting if they bend their knees, keep the load close to their bodies,and  avoid twisting. Often, the most comfortable positions are those that put less stress on your recovering back.  Find a comfortable position to sleep. Use a firm mattress and lie on your side with your knees slightly bent. If you lie on your back, put a pillow under your knees.  Only take over-the-counter or prescription medicines as directed by your caregiver. Over-the-counter medicines to reduce pain and inflammation are often the most helpful.Your caregiver may prescribe muscle relaxant drugs.These medicines help dull your pain so you can more quickly return to your normal activities and healthy exercise.  Put ice on the injured area.  Put ice in a plastic bag.  Place a towel between your skin and the bag.  Leave the ice on for 15-20 minutes, 03-04 times a day for the first 2 to 3 days. After that, ice and heat may be alternated to reduce pain and spasms.  Ask your caregiver about trying back exercises and gentle massage. This may be of some benefit.  Avoid feeling anxious or stressed.Stress increases muscle tension and can worsen back pain.It is important to recognize when you are anxious or stressed and learn ways to manage it.Exercise is a great option. SEEK MEDICAL CARE IF:  You have pain that is not relieved with rest or medicine.  You have pain that does not improve in 1 week.  You have new symptoms.  You are generally not feeling well. SEEK   IMMEDIATE MEDICAL CARE IF:   You have pain that radiates from your back into your legs.  You develop new bowel or bladder control problems.  You have unusual weakness or numbness in your arms or legs.  You develop nausea or vomiting.  You develop abdominal pain.  You feel faint. Document Released: 09/16/2005 Document Revised: 03/17/2012 Document Reviewed: 01/18/2014 Exit[employer] Patient Information 2015 [employer]. This information is not intended to replace advice given to you by your health care provider. Make sure you  discuss any questions you have with your health care provider.  

## 2015-01-21 ENCOUNTER — Other Ambulatory Visit: Payer: Self-pay | Admitting: Internal Medicine

## 2015-01-31 ENCOUNTER — Ambulatory Visit (INDEPENDENT_AMBULATORY_CARE_PROVIDER_SITE_OTHER): Payer: BLUE CROSS/BLUE SHIELD | Admitting: Internal Medicine

## 2015-01-31 ENCOUNTER — Encounter: Payer: Self-pay | Admitting: Internal Medicine

## 2015-01-31 VITALS — BP 138/100 | HR 80 | Ht 64.0 in | Wt 171.0 lb

## 2015-01-31 DIAGNOSIS — Z23 Encounter for immunization: Secondary | ICD-10-CM

## 2015-01-31 DIAGNOSIS — Z0000 Encounter for general adult medical examination without abnormal findings: Secondary | ICD-10-CM | POA: Diagnosis not present

## 2015-01-31 MED ORDER — AMLODIPINE BESY-BENAZEPRIL HCL 5-40 MG PO CAPS
1.0000 | ORAL_CAPSULE | Freq: Every day | ORAL | Status: DC
Start: 2015-01-31 — End: 2015-08-20

## 2015-01-31 MED ORDER — AMITRIPTYLINE HCL 25 MG PO TABS: 25.0000 mg | ORAL_TABLET | Freq: Every day | ORAL | Status: DC | PRN

## 2015-01-31 MED ORDER — IBUPROFEN 600 MG PO TABS: 600.0000 mg | ORAL_TABLET | Freq: Four times a day (QID) | ORAL | Status: DC | PRN

## 2015-01-31 MED ORDER — HYDROCORTISONE ACETATE 25 MG RE SUPP
25.0000 mg | Freq: Two times a day (BID) | RECTAL | Status: DC
Start: 2015-01-31 — End: 2015-03-24

## 2015-01-31 MED ORDER — POTASSIUM CHLORIDE CRYS ER 20 MEQ PO TBCR: 20.0000 meq | EXTENDED_RELEASE_TABLET | Freq: Two times a day (BID) | ORAL | Status: DC

## 2015-01-31 NOTE — Progress Notes (Signed)
Pre visit review using our clinic review tool, if applicable. No additional management support is needed unless otherwise documented below in the visit note. 

## 2015-01-31 NOTE — Assessment & Plan Note (Signed)
We discussed age appropriate health related issues, including available/recomended screening tests and vaccinations. We discussed a need for adhering to healthy diet and exercise. Labs/EKG were reviewed/ordered. All questions were answered. Mammo 7/16 PAP w/Dr Runell Gess

## 2015-02-03 ENCOUNTER — Other Ambulatory Visit (INDEPENDENT_AMBULATORY_CARE_PROVIDER_SITE_OTHER): Payer: BLUE CROSS/BLUE SHIELD

## 2015-02-03 DIAGNOSIS — Z0000 Encounter for general adult medical examination without abnormal findings: Secondary | ICD-10-CM

## 2015-02-03 LAB — BASIC METABOLIC PANEL
BUN: 8 mg/dL (ref 6–23)
CALCIUM: 9.4 mg/dL (ref 8.4–10.5)
CO2: 27 meq/L (ref 19–32)
Chloride: 107 mEq/L (ref 96–112)
Creatinine, Ser: 0.71 mg/dL (ref 0.40–1.20)
GFR: 105.07 mL/min (ref >60.00)
Glucose, Bld: 114 mg/dL — ABNORMAL HIGH (ref 70–99)
Potassium: 3.7 mEq/L (ref 3.5–5.1)
Sodium: 140 mEq/L (ref 135–145)

## 2015-02-03 LAB — URINALYSIS, ROUTINE W REFLEX MICROSCOPIC
BILIRUBIN URINE: NEGATIVE
Hgb urine dipstick: NEGATIVE
KETONES UR: NEGATIVE
Nitrite: NEGATIVE
PH: 7 (ref 5.0–8.0)
RBC / HPF: NONE SEEN (ref 0–?)
Specific Gravity, Urine: 1.01 (ref 1.000–1.030)
Total Protein, Urine: NEGATIVE
URINE GLUCOSE: NEGATIVE
Urobilinogen, UA: 0.2 (ref 0.0–1.0)

## 2015-02-03 LAB — CBC WITH DIFFERENTIAL/PLATELET
BASOS PCT: 0.7 % (ref 0.0–3.0)
Basophils Absolute: 0 10*3/uL (ref 0.0–0.1)
Eosinophils Absolute: 0.1 10*3/uL (ref 0.0–0.7)
Eosinophils Relative: 3.5 % (ref 0.0–5.0)
HCT: 30.3 % — ABNORMAL LOW (ref 36.0–46.0)
HEMOGLOBIN: 10.2 g/dL — AB (ref 12.0–15.0)
LYMPHS PCT: 18.9 % (ref 12.0–46.0)
Lymphs Abs: 0.8 10*3/uL (ref 0.7–4.0)
MCHC: 33.6 g/dL (ref 30.0–36.0)
MCV: 89.8 fl (ref 78.0–100.0)
Monocytes Absolute: 0.3 10*3/uL (ref 0.1–1.0)
Monocytes Relative: 7.8 % (ref 3.0–12.0)
Neutro Abs: 2.9 10*3/uL (ref 1.4–7.7)
Neutrophils Relative %: 69.1 % (ref 43.0–77.0)
Platelets: 313 10*3/uL (ref 150.0–400.0)
RBC: 3.37 Mil/uL — AB (ref 3.87–5.11)
RDW: 14.4 % (ref 11.5–15.5)
WBC: 4.2 10*3/uL (ref 4.0–10.5)

## 2015-02-03 LAB — SEDIMENTATION RATE: SED RATE: 39 mm/h — AB (ref 0–22)

## 2015-02-03 LAB — HEPATIC FUNCTION PANEL
ALT: 17 U/L (ref 0–35)
AST: 19 U/L (ref 0–37)
Albumin: 3.6 g/dL (ref 3.5–5.2)
Alkaline Phosphatase: 87 U/L (ref 39–117)
BILIRUBIN DIRECT: 0.1 mg/dL (ref 0.0–0.3)
BILIRUBIN TOTAL: 0.4 mg/dL (ref 0.2–1.2)
Total Protein: 7.6 g/dL (ref 6.0–8.3)

## 2015-02-03 LAB — LIPID PANEL
CHOL/HDL RATIO: 3
Cholesterol: 160 mg/dL (ref 0–200)
HDL: 51.3 mg/dL (ref >39.00)
LDL CALC: 93 mg/dL (ref 0–99)
NONHDL: 108.7
Triglycerides: 81 mg/dL (ref 0.0–149.0)
VLDL: 16.2 mg/dL (ref 0.0–40.0)

## 2015-02-03 LAB — TSH: TSH: 0.56 u[IU]/mL (ref 0.35–4.50)

## 2015-02-07 ENCOUNTER — Other Ambulatory Visit: Payer: Self-pay | Admitting: Internal Medicine

## 2015-02-08 ENCOUNTER — Telehealth: Payer: Self-pay | Admitting: Internal Medicine

## 2015-02-08 ENCOUNTER — Encounter: Payer: Self-pay | Admitting: *Deleted

## 2015-02-08 NOTE — Telephone Encounter (Signed)
Please call patient on Friday morning between 8-10 to review lab results. I read dr plot's notations, and she still has some questions.

## 2015-02-08 NOTE — Telephone Encounter (Signed)
Pt informed 3 mo f/u scheduled  Notes Recorded by Cresenciano Lick, CMA on 02/06/2015 at 4:13 PM Left mess for patient to call back. Notes Recorded by Cassandria Anger, MD on 02/06/2015 at 1:45 AM Erline Levine, please, inform patient that all labs are normal except for a slight anemia and a slight sugar elevation. We need to check CBC and iron/TIBC, BMET and A1c in 3 mo Thx

## 2015-02-08 NOTE — Progress Notes (Signed)
   Subjective:    Patient ID: Kelly Santos, female    DOB: 01/07/1946, 69 y.o.   MRN: [id number]  HPI  The patient is here for a wellness exam. The patient has been doing well overall without major physical or psychological issues going on lately. The patient needs to address  chronic hypertension that has been well controlled with medicines.  Wt Readings from Last 3 Encounters:  01/31/15 171 lb (77.565 kg)  01/14/15 172 lb (78.019 kg)  01/26/14 170 lb (77.111 kg)   BP Readings from Last 3 Encounters:  01/31/15 138/100  01/14/15 130/84  01/26/14 120/78    Review of Systems  Constitutional: Negative for chills, activity change, appetite change, fatigue and unexpected weight change.  HENT: Negative for congestion, ear pain, hearing loss, mouth sores, sinus pressure, sore throat and voice change.   Eyes: Negative for visual disturbance.  Respiratory: Negative for cough, chest tightness, shortness of breath and wheezing.   Cardiovascular: Negative for palpitations.  Gastrointestinal: Negative for nausea, vomiting and abdominal pain.  Genitourinary: Negative for frequency, difficulty urinating and vaginal pain.  Musculoskeletal: Negative for back pain and gait problem.  Skin: Negative for pallor and rash.  Neurological: Negative for dizziness, tremors, weakness, numbness and headaches.  Psychiatric/Behavioral: Negative for suicidal ideas, confusion and sleep disturbance. The patient is not nervous/anxious.        Objective:   Physical Exam  Constitutional: She appears well-developed. No distress.  HENT:  Head: Normocephalic.  Right Ear: External ear normal.  Left Ear: External ear normal.  Nose: Nose normal.  Mouth/Throat: Oropharynx is clear and moist.  Eyes: Conjunctivae are normal. Pupils are equal, round, and reactive to light. Right eye exhibits no discharge. Left eye exhibits no discharge.  Neck: Normal range of motion. Neck supple. No JVD present. No tracheal deviation  present. No thyromegaly present.  Cardiovascular: Normal rate, regular rhythm and normal heart sounds.   Pulmonary/Chest: No stridor. No respiratory distress. She has no wheezes.  Abdominal: Soft. Bowel sounds are normal. She exhibits no distension and no mass. There is no tenderness. There is no rebound and no guarding.  Musculoskeletal: She exhibits no edema or tenderness.  Lymphadenopathy:    She has no cervical adenopathy.  Neurological: She displays normal reflexes. No cranial nerve deficit. She exhibits normal muscle tone. Coordination normal.  Skin: No rash noted. No erythema.  Psychiatric: She has a normal mood and affect. Her behavior is normal. Judgment and thought content normal.   Lab Results  Component Value Date   WBC 4.2 02/03/2015   HGB 10.2* 02/03/2015   HCT 30.3* 02/03/2015   PLT 313.0 02/03/2015   GLUCOSE 114* 02/03/2015   CHOL 160 02/03/2015   TRIG 81.0 02/03/2015   HDL 51.30 02/03/2015   LDLCALC 93 02/03/2015   ALT 17 02/03/2015   AST 19 02/03/2015   NA 140 02/03/2015   K 3.7 02/03/2015   CL 107 02/03/2015   CREATININE 0.71 02/03/2015   BUN 8 02/03/2015   CO2 27 02/03/2015   TSH 0.56 02/03/2015   INR 0.96 05/09/2011          Assessment & Plan:

## 2015-03-24 ENCOUNTER — Ambulatory Visit (INDEPENDENT_AMBULATORY_CARE_PROVIDER_SITE_OTHER): Payer: BLUE CROSS/BLUE SHIELD | Admitting: Internal Medicine

## 2015-03-24 ENCOUNTER — Encounter: Payer: Self-pay | Admitting: Internal Medicine

## 2015-03-24 ENCOUNTER — Telehealth: Payer: Self-pay | Admitting: Internal Medicine

## 2015-03-24 ENCOUNTER — Other Ambulatory Visit (INDEPENDENT_AMBULATORY_CARE_PROVIDER_SITE_OTHER): Payer: BLUE CROSS/BLUE SHIELD

## 2015-03-24 VITALS — BP 150/96 | HR 83 | Temp 98.6°F | Wt 169.0 lb

## 2015-03-24 DIAGNOSIS — D500 Iron deficiency anemia secondary to blood loss (chronic): Secondary | ICD-10-CM | POA: Diagnosis not present

## 2015-03-24 DIAGNOSIS — J069 Acute upper respiratory infection, unspecified: Secondary | ICD-10-CM | POA: Diagnosis not present

## 2015-03-24 DIAGNOSIS — R739 Hyperglycemia, unspecified: Secondary | ICD-10-CM

## 2015-03-24 LAB — BASIC METABOLIC PANEL
BUN: 11 mg/dL (ref 6–23)
CHLORIDE: 106 meq/L (ref 96–112)
CO2: 26 meq/L (ref 19–32)
Calcium: 9.3 mg/dL (ref 8.4–10.5)
Creatinine, Ser: 0.69 mg/dL (ref 0.40–1.20)
GFR: 108.55 mL/min (ref >60.00)
Glucose, Bld: 100 mg/dL — ABNORMAL HIGH (ref 70–99)
Potassium: 3.9 mEq/L (ref 3.5–5.1)
SODIUM: 138 meq/L (ref 135–145)

## 2015-03-24 LAB — CBC WITH DIFFERENTIAL/PLATELET
Basophils Absolute: 0 10*3/uL (ref 0.0–0.1)
Basophils Relative: 0.6 % (ref 0.0–3.0)
Eosinophils Absolute: 0.1 10*3/uL (ref 0.0–0.7)
Eosinophils Relative: 2.6 % (ref 0.0–5.0)
HCT: 29.6 % — ABNORMAL LOW (ref 36.0–46.0)
HEMOGLOBIN: 9.4 g/dL — AB (ref 12.0–15.0)
LYMPHS ABS: 1.2 10*3/uL (ref 0.7–4.0)
Lymphocytes Relative: 24.4 % (ref 12.0–46.0)
MCHC: 31.8 g/dL (ref 30.0–36.0)
MCV: 84.5 fl (ref 78.0–100.0)
MONO ABS: 0.5 10*3/uL (ref 0.1–1.0)
Monocytes Relative: 10.1 % (ref 3.0–12.0)
NEUTROS ABS: 3.1 10*3/uL (ref 1.4–7.7)
Neutrophils Relative %: 62.3 % (ref 43.0–77.0)
Platelets: 306 10*3/uL (ref 150.0–400.0)
RBC: 3.51 Mil/uL — ABNORMAL LOW (ref 3.87–5.11)
RDW: 16.5 % — ABNORMAL HIGH (ref 11.5–15.5)
WBC: 4.9 10*3/uL (ref 4.0–10.5)

## 2015-03-24 LAB — POCT RAPID STREP A (OFFICE): Rapid Strep A Screen: NEGATIVE

## 2015-03-24 LAB — HEMOGLOBIN A1C: Hgb A1c MFr Bld: 5.9 % (ref 4.6–6.5)

## 2015-03-24 LAB — IBC PANEL
Iron: 17 ug/dL — ABNORMAL LOW (ref 42–145)
Saturation Ratios: 3.4 % — ABNORMAL LOW (ref 20.0–50.0)
Transferrin: 356 mg/dL (ref 212.0–360.0)

## 2015-03-24 LAB — VITAMIN B12: Vitamin B-12: 663 pg/mL (ref 211–911)

## 2015-03-24 MED ORDER — HYDROCORTISONE ACETATE 25 MG RE SUPP: 25.0000 mg | Freq: Two times a day (BID) | RECTAL | Status: DC

## 2015-03-24 MED ORDER — AZITHROMYCIN 250 MG PO TABS
ORAL_TABLET | ORAL | Status: DC
Start: 2015-03-24 — End: 2015-06-09

## 2015-03-24 MED ORDER — HYDROCORTISONE 2.5 % EX OINT: TOPICAL_OINTMENT | Freq: Two times a day (BID) | CUTANEOUS | Status: DC

## 2015-03-24 MED ORDER — PROMETHAZINE-CODEINE 6.25-10 MG/5ML PO SYRP: 5.0000 mL | ORAL_SOLUTION | ORAL | Status: DC | PRN

## 2015-03-24 NOTE — Assessment & Plan Note (Signed)
Labs Wt loss, diet 

## 2015-03-24 NOTE — Telephone Encounter (Signed)
Insurance wont pay for her suppository that was prescribed today. Insurance will cover Hydrocort 1% cream, or Hydrocortison 2/5% cream. If you feel one of these will work the pharmacy is [facility] on [address].

## 2015-03-24 NOTE — Progress Notes (Signed)
Pre visit review using our clinic review tool, if applicable. No additional management support is needed unless otherwise documented below in the visit note. 

## 2015-03-24 NOTE — Progress Notes (Signed)
Subjective:     Cough This is a new problem. The current episode started in the past 7 days. The cough is productive of sputum. Associated symptoms include a sore throat. Pertinent negatives include no chest pain, chills, ear pain, fever, postnasal drip, rash, rhinorrhea or wheezing.  Sore Throat  This is a new problem. The current episode started in the past 7 days. Associated symptoms include coughing. Pertinent negatives include no abdominal pain, congestion, ear pain, neck pain, stridor or vomiting.    The patient needs to address  chronic hypertension, elev glucose, anemia  BP Readings from Last 3 Encounters:  03/24/15 150/96  01/31/15 138/100  01/14/15 130/84   Wt Readings from Last 3 Encounters:  03/24/15 169 lb (76.658 kg)  01/31/15 171 lb (77.565 kg)  01/14/15 172 lb (78.019 kg)      Review of Systems  Constitutional: Negative for fever, chills, diaphoresis, activity change, appetite change, fatigue and unexpected weight change.  HENT: Positive for sore throat. Negative for congestion, dental problem, ear pain, hearing loss, mouth sores, postnasal drip, rhinorrhea, sinus pressure, sneezing and voice change.   Eyes: Negative for pain and visual disturbance.  Respiratory: Positive for cough. Negative for chest tightness, wheezing and stridor.   Cardiovascular: Negative for chest pain, palpitations and leg swelling.  Gastrointestinal: Negative for nausea, vomiting, abdominal pain, blood in stool, abdominal distention and rectal pain.  Genitourinary: Negative for dysuria, hematuria, decreased urine volume, vaginal bleeding, vaginal discharge, difficulty urinating, vaginal pain and menstrual problem.  Musculoskeletal: Negative for back pain, joint swelling, gait problem and neck pain.  Skin: Negative for color change, rash and wound.  Neurological: Negative for dizziness, tremors, syncope, speech difficulty and light-headedness.  Hematological: Negative for adenopathy.   Psychiatric/Behavioral: Negative for suicidal ideas, hallucinations, behavioral problems, confusion, sleep disturbance, dysphoric mood and decreased concentration. The patient is not hyperactive.        Objective:   Physical Exam  Constitutional: She appears well-developed. No distress.  HENT:  Head: Normocephalic.  Right Ear: External ear normal.  Left Ear: External ear normal.  Nose: Nose normal.  Mouth/Throat: Oropharynx is clear and moist.  Eyes: Conjunctivae are normal. Pupils are equal, round, and reactive to light. Right eye exhibits no discharge. Left eye exhibits no discharge.  Neck: Normal range of motion. Neck supple. No JVD present. No tracheal deviation present. No thyromegaly present.  Cardiovascular: Normal rate, regular rhythm and normal heart sounds.   Pulmonary/Chest: No stridor. No respiratory distress. She has no wheezes.  Abdominal: Soft. Bowel sounds are normal. She exhibits no distension and no mass. There is no tenderness. There is no rebound and no guarding.  Musculoskeletal: She exhibits no edema or tenderness.  Lymphadenopathy:    She has no cervical adenopathy.  Neurological: She displays normal reflexes. No cranial nerve deficit. She exhibits normal muscle tone. Coordination normal.  Skin: No rash noted. No erythema.  Psychiatric: She has a normal mood and affect. Her behavior is normal. Judgment and thought content normal.   Eryth throat  Lab Results  Component Value Date   WBC 4.2 02/03/2015   HGB 10.2* 02/03/2015   HCT 30.3* 02/03/2015   PLT 313.0 02/03/2015   GLUCOSE 114* 02/03/2015   CHOL 160 02/03/2015   TRIG 81.0 02/03/2015   HDL 51.30 02/03/2015   LDLCALC 93 02/03/2015   ALT 17 02/03/2015   AST 19 02/03/2015   NA 140 02/03/2015   K 3.7 02/03/2015   CL 107 02/03/2015   CREATININE 0.71  02/03/2015   BUN 8 02/03/2015   CO2 27 02/03/2015   TSH 0.56 02/03/2015   INR 0.96 05/09/2011          Assessment & Plan:

## 2015-03-24 NOTE — Assessment & Plan Note (Signed)
Strep test Zpac Prom-cod

## 2015-03-24 NOTE — Addendum Note (Signed)
Addended by: Cresenciano Lick on: 03/24/2015 09:28 AM   Modules accepted: Orders

## 2015-03-24 NOTE — Telephone Encounter (Signed)
Ok Done Thx 

## 2015-03-24 NOTE — Assessment & Plan Note (Signed)
6/16 Recurrent due to menses before hysterectomy years ago Labs

## 2015-03-25 ENCOUNTER — Other Ambulatory Visit: Payer: Self-pay | Admitting: Internal Medicine

## 2015-03-27 NOTE — Telephone Encounter (Signed)
Left detailed mess informing pt of below.  

## 2015-03-30 ENCOUNTER — Telehealth: Payer: Self-pay | Admitting: Internal Medicine

## 2015-03-30 NOTE — Telephone Encounter (Signed)
Left detailed mess informing pt of MD's advisement  Notes Recorded by Cassandria Anger, MD on 03/29/2015 at 10:25 PM Erline Levine, please, ask Mrs Viernes to start Iron 325 mg a day (OTC) Thx

## 2015-03-30 NOTE — Telephone Encounter (Signed)
Patient called regarding what you talked to her about yesterday. She needs to know what she needs to do now. Will you be putting her on iron supplement? Please call her at [phone]. You can leave a detailed message is she does not answer

## 2015-05-02 ENCOUNTER — Ambulatory Visit: Payer: Medicare Other | Admitting: Internal Medicine

## 2015-06-09 ENCOUNTER — Encounter: Payer: Self-pay | Admitting: Internal Medicine

## 2015-06-09 ENCOUNTER — Other Ambulatory Visit (INDEPENDENT_AMBULATORY_CARE_PROVIDER_SITE_OTHER): Payer: BLUE CROSS/BLUE SHIELD

## 2015-06-09 ENCOUNTER — Ambulatory Visit (INDEPENDENT_AMBULATORY_CARE_PROVIDER_SITE_OTHER): Payer: BLUE CROSS/BLUE SHIELD | Admitting: Internal Medicine

## 2015-06-09 VITALS — BP 120/82 | HR 88 | Wt 172.0 lb

## 2015-06-09 DIAGNOSIS — I10 Essential (primary) hypertension: Secondary | ICD-10-CM

## 2015-06-09 DIAGNOSIS — K635 Polyp of colon: Secondary | ICD-10-CM

## 2015-06-09 DIAGNOSIS — D500 Iron deficiency anemia secondary to blood loss (chronic): Secondary | ICD-10-CM | POA: Diagnosis not present

## 2015-06-09 DIAGNOSIS — K625 Hemorrhage of anus and rectum: Secondary | ICD-10-CM

## 2015-06-09 DIAGNOSIS — K649 Unspecified hemorrhoids: Secondary | ICD-10-CM | POA: Insufficient documentation

## 2015-06-09 LAB — CBC WITH DIFFERENTIAL/PLATELET
BASOS ABS: 0 10*3/uL (ref 0.0–0.1)
Basophils Relative: 0.4 % (ref 0.0–3.0)
EOS PCT: 3.4 % (ref 0.0–5.0)
Eosinophils Absolute: 0.1 10*3/uL (ref 0.0–0.7)
HEMATOCRIT: 33.8 % — AB (ref 36.0–46.0)
HEMOGLOBIN: 11.2 g/dL — AB (ref 12.0–15.0)
Lymphocytes Relative: 22.4 % (ref 12.0–46.0)
Lymphs Abs: 0.9 10*3/uL (ref 0.7–4.0)
MCHC: 33 g/dL (ref 30.0–36.0)
MCV: 91.9 fl (ref 78.0–100.0)
Monocytes Absolute: 0.3 10*3/uL (ref 0.1–1.0)
Monocytes Relative: 7.4 % (ref 3.0–12.0)
NEUTROS PCT: 66.4 % (ref 43.0–77.0)
Neutro Abs: 2.7 10*3/uL (ref 1.4–7.7)
Platelets: 192 10*3/uL (ref 150.0–400.0)
RBC: 3.67 Mil/uL — AB (ref 3.87–5.11)
RDW: 19.7 % — ABNORMAL HIGH (ref 11.5–15.5)
WBC: 4 10*3/uL (ref 4.0–10.5)

## 2015-06-09 LAB — IBC PANEL
Iron: 158 ug/dL — ABNORMAL HIGH (ref 42–145)
SATURATION RATIOS: 36.6 % (ref 20.0–50.0)
TRANSFERRIN: 308 mg/dL (ref 212.0–360.0)

## 2015-06-09 NOTE — Assessment & Plan Note (Signed)
Amlodipine-benazepril

## 2015-06-09 NOTE — Assessment & Plan Note (Signed)
Will ref for colonoscopy 

## 2015-06-09 NOTE — Assessment & Plan Note (Addendum)
Will ref for colonoscopy PO Iron Labs

## 2015-06-09 NOTE — Assessment & Plan Note (Signed)
Chronic and recurrent due to hemorrhoids Colon w/Dr Henrene Pastor to sch

## 2015-06-09 NOTE — Assessment & Plan Note (Signed)
Chronic and recurrent  May need banding by Dr Henrene Pastor

## 2015-06-09 NOTE — Progress Notes (Signed)
Subjective:  Patient ID: Kelly Santos, female    DOB: [birth year]  Age: 69 y.o. MRN: [id number]  CC: No chief complaint on file.   HPI Kelly Santos presents for anemia, HTN, OA, colon polyps f/up  Outpatient Prescriptions Prior to Visit  Medication Sig Dispense Refill  . amitriptyline (ELAVIL) 25 MG tablet TAKE 1 TABLET BY MOUTH EVERY DAY AS NEEDED 30 tablet 0  . amLODipine-benazepril (LOTREL) 5-40 MG per capsule Take 1 capsule by mouth daily. 90 capsule 3  . brimonidine-timolol (COMBIGAN) 0.2-0.5 % ophthalmic solution Place 1 drop into both eyes 2 (two) times daily.    . cholecalciferol (VITAMIN D) 1000 UNITS tablet Take 1,000 Units by mouth daily.    . hydrocortisone 2.5 % ointment Apply topically 2 (two) times daily. 30 g 3  . ibuprofen (ADVIL,MOTRIN) 600 MG tablet Take 1 tablet (600 mg total) by mouth every 6 (six) hours as needed (mild pain). 60 tablet 3  . potassium chloride SA (KLOR-CON M20) 20 MEQ tablet Take 1 tablet (20 mEq total) by mouth 2 (two) times daily. 90 tablet 2  . promethazine-codeine (PHENERGAN WITH CODEINE) 6.25-10 MG/5ML syrup Take 5 mLs by mouth every 4 (four) hours as needed. 300 mL 0  . cyclobenzaprine (FLEXERIL) 5 MG tablet Take 1 tablet at night as a muscle relaxant (Patient not taking: Reported on 06/09/2015) 10 tablet 1  . ipratropium (ATROVENT) 0.03 % nasal spray Place 2 sprays into the nose 2 (two) times daily. (Patient not taking: Reported on 06/09/2015) 30 mL 0  . meloxicam (MOBIC) 7.5 MG tablet Take 1 tablet (7.5 mg total) by mouth daily. (Patient not taking: Reported on 06/09/2015) 10 tablet 0  . triamcinolone cream (KENALOG) 0.5 % APPLY TWICE A DAY AS NEEDED (Patient not taking: Reported on 06/09/2015) 30 g 0  . azithromycin (ZITHROMAX Z-PAK) 250 MG tablet As directed (Patient not taking: Reported on 06/09/2015) 6 each 0   No facility-administered medications prior to visit.    ROS Review of Systems  Constitutional: Positive for fatigue. Negative for  chills, activity change, appetite change and unexpected weight change.  HENT: Negative for congestion, mouth sores and sinus pressure.   Eyes: Negative for visual disturbance.  Respiratory: Negative for cough and chest tightness.   Gastrointestinal: Positive for blood in stool. Negative for nausea, abdominal pain, abdominal distention and rectal pain.  Genitourinary: Negative for frequency, difficulty urinating and vaginal pain.  Musculoskeletal: Negative for back pain and gait problem.  Skin: Negative for pallor and rash.  Neurological: Negative for dizziness, tremors, weakness, numbness and headaches.  Psychiatric/Behavioral: Negative for confusion and sleep disturbance.    Objective:  BP 120/82 mmHg  Pulse 88  Wt 172 lb (78.019 kg)  SpO2 97%  BP Readings from Last 3 Encounters:  06/09/15 120/82  03/24/15 150/96  01/31/15 138/100    Wt Readings from Last 3 Encounters:  06/09/15 172 lb (78.019 kg)  03/24/15 169 lb (76.658 kg)  01/31/15 171 lb (77.565 kg)    Physical Exam  Constitutional: She appears well-developed. No distress.  HENT:  Head: Normocephalic.  Right Ear: External ear normal.  Left Ear: External ear normal.  Nose: Nose normal.  Mouth/Throat: Oropharynx is clear and moist.  Eyes: Conjunctivae are normal. Pupils are equal, round, and reactive to light. Right eye exhibits no discharge. Left eye exhibits no discharge.  Neck: Normal range of motion. Neck supple. No JVD present. No tracheal deviation present. No thyromegaly present.  Cardiovascular: Normal rate, regular rhythm  and normal heart sounds.   Pulmonary/Chest: No stridor. No respiratory distress. She has no wheezes.  Abdominal: Soft. Bowel sounds are normal. She exhibits no distension and no mass. There is no tenderness. There is no rebound and no guarding.  Musculoskeletal: She exhibits no edema or tenderness.  Lymphadenopathy:    She has no cervical adenopathy.  Neurological: She displays normal  reflexes. No cranial nerve deficit. She exhibits normal muscle tone. Coordination normal.  Skin: No rash noted. No erythema.  Psychiatric: She has a normal mood and affect. Her behavior is normal. Judgment and thought content normal.    Lab Results  Component Value Date   WBC 4.9 03/24/2015   HGB 9.4* 03/24/2015   HCT 29.6* 03/24/2015   PLT 306.0 03/24/2015   GLUCOSE 100* 03/24/2015   CHOL 160 02/03/2015   TRIG 81.0 02/03/2015   HDL 51.30 02/03/2015   LDLCALC 93 02/03/2015   ALT 17 02/03/2015   AST 19 02/03/2015   NA 138 03/24/2015   K 3.9 03/24/2015   CL 106 03/24/2015   CREATININE 0.69 03/24/2015   BUN 11 03/24/2015   CO2 26 03/24/2015   TSH 0.56 02/03/2015   INR 0.96 05/09/2011   HGBA1C 5.9 03/24/2015    No results found.  Assessment & Plan:   Diagnoses and all orders for this visit:  Essential hypertension  Colon polyp, hyperplastic -     Ambulatory referral to Gastroenterology  Anemia due to chronic blood loss -     CBC with Differential/Platelet; Future -     IBC panel; Future -     Ambulatory referral to Gastroenterology -     Ambulatory referral to Gastroenterology  RECTAL BLEEDING  Bleeding hemorrhoid  I have discontinued Ms. Micale's azithromycin. I am also having her maintain her cholecalciferol, brimonidine-timolol, ipratropium, triamcinolone cream, cyclobenzaprine, meloxicam, ibuprofen, amLODipine-benazepril, potassium chloride SA, promethazine-codeine, hydrocortisone, amitriptyline, and Iron.  Meds ordered this encounter  Medications  . Ferrous Sulfate (IRON) 325 (65 FE) MG TABS    Sig: Take 1 tablet by mouth daily.     Follow-up: Return in about 4 months (around 10/09/2015) for a follow-up visit.  Walker Kehr, MD

## 2015-06-09 NOTE — Progress Notes (Signed)
Pre visit review using our clinic review tool, if applicable. No additional management support is needed unless otherwise documented below in the visit note. 

## 2015-06-16 ENCOUNTER — Other Ambulatory Visit: Payer: Self-pay | Admitting: Internal Medicine

## 2015-06-21 ENCOUNTER — Telehealth: Payer: Self-pay

## 2015-06-21 NOTE — Telephone Encounter (Signed)
Patient called to educate on Medicare Wellness apt. LVM for the patient to call back to educate and schedule for wellness visit.   Also will ask about Mammogram

## 2015-06-23 ENCOUNTER — Other Ambulatory Visit: Payer: Self-pay | Admitting: Obstetrics and Gynecology

## 2015-06-26 LAB — CYTOLOGY - PAP

## 2015-06-27 NOTE — Telephone Encounter (Signed)
Call to Kelly Santos and introduced AWV; Stated that she has medicare for "hospitalization" only but not part B or D as she is still working and on her [employer], so she does not think she is eligible for AWV.Marland Kitchen  Will check and try to put note in case; will route to [facility] to see if there is an "alert" we can put in snapshot if appropriate.  Dustin, please advise, Tks,

## 2015-06-28 NOTE — Telephone Encounter (Signed)
Patient is advising that she only has Part A  Medicare Part A- Hospitalization and Ambulatory Surgery Medicare Part B- Physicians office   If she doesn't have Part B, she is exactly right. No service we offer is billable to part A

## 2015-07-31 ENCOUNTER — Ambulatory Visit (INDEPENDENT_AMBULATORY_CARE_PROVIDER_SITE_OTHER): Payer: BLUE CROSS/BLUE SHIELD | Admitting: Internal Medicine

## 2015-07-31 ENCOUNTER — Encounter: Payer: Self-pay | Admitting: Internal Medicine

## 2015-07-31 VITALS — BP 144/88 | HR 72 | Temp 98.1°F | Resp 16 | Ht 65.0 in | Wt 171.0 lb

## 2015-07-31 VITALS — BP 110/72 | HR 76 | Ht 64.5 in | Wt 173.8 lb

## 2015-07-31 DIAGNOSIS — D509 Iron deficiency anemia, unspecified: Secondary | ICD-10-CM | POA: Diagnosis not present

## 2015-07-31 DIAGNOSIS — K648 Other hemorrhoids: Secondary | ICD-10-CM

## 2015-07-31 DIAGNOSIS — J069 Acute upper respiratory infection, unspecified: Secondary | ICD-10-CM

## 2015-07-31 DIAGNOSIS — K5901 Slow transit constipation: Secondary | ICD-10-CM | POA: Diagnosis not present

## 2015-07-31 DIAGNOSIS — K625 Hemorrhage of anus and rectum: Secondary | ICD-10-CM

## 2015-07-31 MED ORDER — NA SULFATE-K SULFATE-MG SULF 17.5-3.13-1.6 GM/177ML PO SOLN: 1.0000 | Freq: Once | ORAL | Status: DC

## 2015-07-31 MED ORDER — AMOXICILLIN 875 MG PO TABS: 875.0000 mg | ORAL_TABLET | Freq: Two times a day (BID) | ORAL | Status: DC

## 2015-07-31 NOTE — Patient Instructions (Signed)
You have been scheduled for an endoscopy and colonoscopy. Please follow the written instructions given to you at your visit today. Please pick up your prep supplies at the pharmacy within the next 1-3 days. If you use inhalers (even only as needed), please bring them with you on the day of your procedure.  

## 2015-07-31 NOTE — Progress Notes (Signed)
Subjective:  This chart was scribed for Kelly Lin, MD by Thea Alken, ED Scribe. This patient was seen in room 1 and the patient's care was started at 10:29 AM.   Patient ID: Kelly Santos, female    DOB: 1946/05/11, 69 y.o.   MRN: [id number]  HPI   Chief Complaint  Patient presents with  . Cough    x 3 days   . Nasal Congestion  . throat ichy  . Back Pain    x 5 days    HPI Comments: Kelly Santos is a 69 y.o. female who presents to the [facility] complaining of a productive cough and scratchy throat for 3 days ago. Pt states cough keep her awake at night, having to sit up while sleeping. She also has intermittent nasal congestion, fever highest being 101.8 and body aches to her back and sides with cough. She was prescribed phenergan cough syrup in the past and still has this at home but has not tried it. She denies drug allergies.   Has not had flu shot this years.    Patient Active Problem List   Diagnosis Date Noted  . Diastolic dysfunction 40/98/1191  . HYPERTHYROIDISM 03/26/2010  . CERVICAL RADICULOPATHY, RIGHT 03/25/2008  . Essential hypertension 11/18/2007  . CORONARY ARTERY DISEASE 11/18/2007  . GERD 11/18/2007    Prior to Admission medications   Medication Sig Start Date End Date Taking? Authorizing Provider  amitriptyline (ELAVIL) 25 MG tablet TAKE 1 TABLET BY MOUTH EVERY DAY AS NEEDED 03/27/15  Yes Aleksei Plotnikov V, MD  amitriptyline (ELAVIL) 25 MG tablet TAKE 1 TABLET BY MOUTH EVERY DAY AS NEEDED 06/19/15  Yes Aleksei Plotnikov V, MD  amLODipine-benazepril (LOTREL) 5-40 MG per capsule Take 1 capsule by mouth daily. 01/31/15  Yes Aleksei Plotnikov V, MD  brimonidine-timolol (COMBIGAN) 0.2-0.5 % ophthalmic solution Place 1 drop into both eyes 2 (two) times daily.   Yes Historical Provider, MD  cholecalciferol (VITAMIN D) 1000 UNITS tablet Take 1,000 Units by mouth daily.   Yes Historical Provider, MD  ibuprofen (ADVIL,MOTRIN) 600 MG tablet  Take 1 tablet (600 mg total) by mouth every 6 (six) hours as needed (mild pain). 01/31/15  Yes Aleksei Plotnikov V, MD  potassium chloride SA (KLOR-CON M20) 20 MEQ tablet Take 1 tablet (20 mEq total) by mouth 2 (two) times daily. 01/31/15  Yes Aleksei Plotnikov V, MD  cyclobenzaprine (FLEXERIL) 5 MG tablet Take 1 tablet at night as a muscle relaxant Patient not taking: Reported on 06/09/2015 01/14/15   Darlyne Russian, MD  Ferrous Sulfate (IRON) 325 (65 FE) MG TABS Take 1 tablet by mouth daily.    Historical Provider, MD  hydrocortisone 2.5 % ointment Apply topically 2 (two) times daily. Patient not taking: Reported on 07/31/2015 03/24/15   Aleksei Plotnikov V, MD  ipratropium (ATROVENT) 0.03 % nasal spray Place 2 sprays into the nose 2 (two) times daily. Patient not taking: Reported on 06/09/2015 04/28/13   Dian Queen, MD  meloxicam (MOBIC) 7.5 MG tablet Take 1 tablet (7.5 mg total) by mouth daily. Patient not taking: Reported on 06/09/2015 01/14/15   Darlyne Russian, MD  promethazine-codeine [facility] WITH CODEINE) 6.25-10 MG/5ML syrup Take 5 mLs by mouth every 4 (four) hours as needed. Patient not taking: Reported on 07/31/2015 03/24/15   Lew Dawes V, MD  triamcinolone cream (KENALOG) 0.5 % APPLY TWICE A DAY AS NEEDED Patient not taking: Reported on 06/09/2015 09/19/14   Aleksei Plotnikov V,  MD   Review of Systems  Constitutional: Positive for fever and chills. Negative for fatigue.  HENT: Positive for congestion and voice change.   Respiratory: Positive for cough.   Gastrointestinal: Negative for nausea and vomiting.       Objective:   Physical Exam  Constitutional: She is oriented to person, place, and time. She appears well-developed and well-nourished. No distress.  HENT:  Head: Normocephalic and atraumatic.  Mouth/Throat: Posterior oropharyngeal erythema present.  Clear rhinorrhea  Eyes: Conjunctivae and EOM are normal.  Neck: Neck supple. No thyromegaly present.  Cardiovascular:  Normal rate, regular rhythm and normal heart sounds.   Pulmonary/Chest: Effort normal and breath sounds normal.  Lymphadenopathy:    She has no cervical adenopathy.  Neurological: She is alert and oriented to person, place, and time.  Nursing note and vitals reviewed.  Filed Vitals:   07/31/15 0941  BP: 144/88  Pulse: 72  Temp: 98.1 F (36.7 C)  TempSrc: Oral  Resp: 16  Height: 5\' 5"  (1.651 m)  Weight: 171 lb (77.565 kg)  SpO2: 98%   Assessment & Plan:  Viral URI with cough advised starting her cough med at home/saline spray etc At her insistence --so she won't have to return--she is given amox to start in 48-72hr if not well or if worse   Meds ordered this encounter  Medications  . amoxicillin (AMOXIL) 875 MG tablet    Sig: Take 1 tablet (875 mg total) by mouth 2 (two) times daily.    Dispense:  20 tablet    Refill:  0    I have completed the patient encounter in its entirety as documented by the scribe, with editing by me where necessary. Robert P. Laney Pastor, M.D.

## 2015-07-31 NOTE — Progress Notes (Signed)
HISTORY OF PRESENT ILLNESS:  Kelly Santos is a 69 y.o. female with multiple medical problems as listed below who is referred by her primary care provider, Dr. Alain Santos, with chief complaint of back deficiency anemia and rectal bleeding. The patient was last evaluated in April 2015 for rectal bleeding secondary to internal hemorrhoids (friable and prolapsing). She was treated with Metamucil and Anusol suppositories. Apparently, did not use suppositories due to cost. Questionable compliance with fiber. She describes her bowels as constipated. He reports bright red blood per rectum with defecation about every other bowel movement. In June she was found to have iron deficiency anemia with a hemoglobin of 9.4. MCV 84.5. Iron saturation 3.4%. She was placed on iron supplementation by mouth. Repeat hemoglobin in September was improved at 11.2. Iron saturation also improved at 36%. Patient's GI review of systems is otherwise negative. She denies abdominal pain, weight loss, or melena. She does use ibuprofen for aches and pains. No prior upper endoscopy. She did undergo colonoscopy in October 2010. At that time she was found to have moderate diverticulosis and hemorrhoids. A diminutive colon polyp was removed and found to be non-adenomatous. There is no family history of colon cancer. The patient is status post remote hysterectomy and does not donate blood  REVIEW OF SYSTEMS:  All non-GI ROS negative except for cough, hoarseness (since Saturday)  Past Medical History  Diagnosis Date  . Hypertension   . Hyperthyroidism   . Hemorrhoids   . Hx of colonic polyps   . Anemia   . GERD (gastroesophageal reflux disease)   . Diverticulosis   . Osteoarthritis   . Vitamin D deficiency   . Uterine polyp   . Generalized headaches   . Hiatal hernia     On CT done Jan 31, 2011   . Glaucoma   . CAD (coronary artery disease)     no cardiologist, followed by PCP only  . Cataract     Past Surgical History   Procedure Laterality Date  . Dilation and curettage of uterus  01/18/2013    with uterine polypectomy  . Abdominal hysterectomy N/A 04/26/2013    Procedure: HYSTERECTOMY ABDOMINAL;  Surgeon: Kelly Mourning, MD;  Location: [facility];  Service: Gynecology;  Laterality: N/A;  . Salpingoophorectomy Bilateral 04/26/2013    Procedure: SALPINGO OOPHORECTOMY;  Surgeon: Kelly Mourning, MD;  Location: [facility];  Service: Gynecology;  Laterality: Bilateral;    Social History Kelly Santos  reports that she has never smoked. She has never used smokeless tobacco. She reports that she does not drink alcohol or use illicit drugs.  family history includes Deep vein thrombosis (age of onset: 31) in her son; Hypertension in her father and mother. There is no history of Colon cancer.  No Known Allergies     PHYSICAL EXAMINATION: Vital signs: BP 110/72 mmHg  Pulse 76  Ht 5' 4.5" (1.638 m)  Wt 173 lb 12.8 oz (78.835 kg)  BMI 29.38 kg/m2  Constitutional: Overweight but otherwise generally well-appearing, no acute distress Psychiatric: alert and oriented x3, cooperative Eyes: extraocular movements intact, anicteric, conjunctiva pink Mouth: oral pharynx moist, no lesions. Mild erythema in the posterior pharynx Neck: supple without thyromegaly LYMPH: no lymphadenopathy Cardiovascular: heart regular rate and rhythm, no murmur Lungs: clear to auscultation bilaterally Abdomen: soft, obese, nontender, nondistended, no obvious ascites, no peritoneal signs, normal bowel sounds, no organomegaly Rectal: Deferred until colonoscopy Extremities: no clubbing cyanosis or lower extremity edema bilaterally Skin: no lesions on visible extremities  Neuro: No focal deficits. Cranial nerves intact.  ASSESSMENT:  #1. Iron deficiency anemia. Rule out significant GI mucosal lesion #2. Chronic intermittent rectal bleeding presumably due to hemorrhoids #3. Colonoscopy 2010 with diverticulosis, hemorrhoids, and  non-adenomatous polyp #4. Functional constipation  PLAN:  #1. Colonoscopy to evaluate rectal bleeding and iron deficiency anemia #2. Upper endoscopy to evaluate iron deficiency anemia #3. Hold iron 1 week prior to colonoscopy to assist with prep, then resume #4. Treatment for hemorrhoids to be determined #5. MiraLAX for constipation. Discussed with her the proper way to titrate  A copy has been sent to Dr. Alain Santos

## 2015-08-20 ENCOUNTER — Other Ambulatory Visit: Payer: Self-pay | Admitting: Internal Medicine

## 2015-09-29 ENCOUNTER — Encounter: Payer: Self-pay | Admitting: Internal Medicine

## 2015-09-29 ENCOUNTER — Ambulatory Visit (AMBULATORY_SURGERY_CENTER): Payer: BLUE CROSS/BLUE SHIELD | Admitting: Internal Medicine

## 2015-09-29 VITALS — BP 139/68 | HR 61 | Temp 97.8°F | Resp 21 | Ht 64.0 in | Wt 173.0 lb

## 2015-09-29 DIAGNOSIS — K222 Esophageal obstruction: Secondary | ICD-10-CM

## 2015-09-29 DIAGNOSIS — K253 Acute gastric ulcer without hemorrhage or perforation: Secondary | ICD-10-CM | POA: Diagnosis not present

## 2015-09-29 DIAGNOSIS — K625 Hemorrhage of anus and rectum: Secondary | ICD-10-CM | POA: Diagnosis present

## 2015-09-29 DIAGNOSIS — D125 Benign neoplasm of sigmoid colon: Secondary | ICD-10-CM | POA: Diagnosis not present

## 2015-09-29 DIAGNOSIS — D509 Iron deficiency anemia, unspecified: Secondary | ICD-10-CM

## 2015-09-29 DIAGNOSIS — K635 Polyp of colon: Secondary | ICD-10-CM

## 2015-09-29 MED ORDER — SODIUM CHLORIDE 0.9 % IV SOLN: 500.0000 mL | INTRAVENOUS | Status: DC

## 2015-09-29 NOTE — Op Note (Signed)
[name] [place], [address]   COLONOSCOPY PROCEDURE REPORT  PATIENT: Kelly, Santos  MR#: [id number] BIRTHDATE: 1946-08-23 , 69  yrs. old GENDER: female ENDOSCOPIST: Eustace Quail, MD REFERRED [id number] Avel Sensor, M.D. PROCEDURE DATE:  09/29/2015 PROCEDURE:   Colonoscopy, diagnostic and Colonoscopy with snare polypectomy X1 First Screening Colonoscopy - Avg.  risk and is 50 yrs.  old or older - No.  Prior Negative Screening - Now for repeat screening. N/A  History of Adenoma - Now for follow-up colonoscopy & has been > or = to 3 yrs.  N/A  Polyps removed today? Yes ASA CLASS:   Class II INDICATIONS:iron deficiency anemia and rectal bleeding.  . Prior exam 2010 with diverticulosis MEDICATIONS: Monitored anesthesia care and Propofol 280 mg IV  DESCRIPTION OF PROCEDURE:   After the risks benefits and alternatives of the procedure were thoroughly explained, informed consent was obtained.  The digital rectal exam revealed hemorrhoids.   The LB [id number]  endoscope was introduced through the anus and advanced to the cecum, which was identified by both the appendix and ileocecal valve. No adverse events experienced.   The quality of the prep was good.  (Suprep was used) The instrument was then slowly withdrawn as the colon was fully examined. Estimated blood loss is zero unless otherwise noted in this procedure report.      COLON FINDINGS: A single polyp measuring 3 mm in size was found in the sigmoid colon.  A polypectomy was performed with a cold snare. The resection was complete, the polyp tissue was completely retrieved and sent to histology.   There was moderate diverticulosis noted throughout the entire examined colon.   The examination was otherwise normal.  Retroflexed views revealed internal hemorrhoids. The time to cecum = 9.6 Withdrawal time = 10.6   The scope was withdrawn and the procedure completed. COMPLICATIONS: There  were no immediate complications.  ENDOSCOPIC IMPRESSION: 1.   Single polyp was found in the sigmoid colon; polypectomy was performed with a cold snare 2.   Moderate diverticulosis was noted throughout the entire examined colon 3.   The examination was otherwise normal  RECOMMENDATIONS: 1.  Repeat colonoscopy in 5 years if polyp adenomatous; otherwise prn 2.  Upper endoscopy today (please see report)  eSigned:  Eustace Quail, MD 09/29/2015 2:29 PM   cc: The Patient and [facility]  Plotnikov, MD

## 2015-09-29 NOTE — Patient Instructions (Signed)

## 2015-09-29 NOTE — Op Note (Signed)
[place]  [address]. [place], [address]   ENDOSCOPY PROCEDURE REPORT  PATIENT: Kelly Santos, Kelly Santos  MR#: [id number] BIRTHDATE: Oct 28, 1945 , 69  yrs. old GENDER: female ENDOSCOPIST: Eustace Quail, MD REFERRED BY:  Creig Hines, M.D. PROCEDURE DATE:  09/29/2015 PROCEDURE:  EGD, diagnostic ASA CLASS:     Class II INDICATIONS:  iron deficiency anemia. MEDICATIONS: Monitored anesthesia care and Propofol 120 mg IV TOPICAL ANESTHETIC: none  DESCRIPTION OF PROCEDURE: After the risks benefits and alternatives of the procedure were thoroughly explained, informed consent was obtained.  The LB [id number] endoscope was introduced through the mouth and advanced to the second portion of the duodenum , Without limitations.  The instrument was slowly withdrawn as the mucosa was fully examined.  EXAM:Esophagus was slightly dilated.  There was a ringlike stricture at the gastroesophageal junction measuring approximately 15 mm in diameter.  The stomach revealed a 5 cm hiatal hernia with Cameron erosions.  The remainder of the stomach was normal.  The duodenum was normal.  Retroflexed views revealed a hiatal hernia.     The scope was then withdrawn from the patient and the procedure completed.  COMPLICATIONS: There were no immediate complications.  ENDOSCOPIC IMPRESSION: 1. Cameron erosions associated with hiatal hernia. This is likely the cause for iron efficiency anemia 2. Incidental esophageal stricture 3. Otherwise normal EGD  RECOMMENDATIONS: 1. Continue iron therapy indefinitely 2. Return to the care of Dr. Alain Marion. He can monitor your blood counts as he deems necessary  REPEAT EXAM:  eSigned:  Eustace Quail, MD 09/29/2015 2:35 PM    CC:The Patient and [facility]ames[facility] [place].  Plotnikov, MD

## 2015-09-29 NOTE — Progress Notes (Signed)
Pt coughing with desats to mid sixties. BVM with 100% O2 for positive pressure. Sats resolvbed to high90s. Awake to recovery, report given, VSS

## 2015-09-29 NOTE — Progress Notes (Signed)
Called to room to assist during endoscopic procedure.  Patient ID and intended procedure confirmed with present staff. Received instructions for my participation in the procedure from the performing physician.  

## 2015-10-03 ENCOUNTER — Telehealth: Payer: Self-pay | Admitting: *Deleted

## 2015-10-03 NOTE — Telephone Encounter (Signed)
No answer. No identifier. Message left to call if questions or concerns. 

## 2015-10-06 ENCOUNTER — Encounter: Payer: Self-pay | Admitting: Internal Medicine

## 2015-11-02 ENCOUNTER — Encounter: Payer: Self-pay | Admitting: Internal Medicine

## 2015-11-02 ENCOUNTER — Ambulatory Visit (INDEPENDENT_AMBULATORY_CARE_PROVIDER_SITE_OTHER): Payer: BLUE CROSS/BLUE SHIELD | Admitting: Internal Medicine

## 2015-11-02 ENCOUNTER — Other Ambulatory Visit (INDEPENDENT_AMBULATORY_CARE_PROVIDER_SITE_OTHER): Payer: BLUE CROSS/BLUE SHIELD

## 2015-11-02 VITALS — BP 118/80 | HR 71 | Wt 175.0 lb

## 2015-11-02 DIAGNOSIS — R5382 Chronic fatigue, unspecified: Secondary | ICD-10-CM

## 2015-11-02 DIAGNOSIS — D500 Iron deficiency anemia secondary to blood loss (chronic): Secondary | ICD-10-CM

## 2015-11-02 DIAGNOSIS — K625 Hemorrhage of anus and rectum: Secondary | ICD-10-CM

## 2015-11-02 DIAGNOSIS — E559 Vitamin D deficiency, unspecified: Secondary | ICD-10-CM

## 2015-11-02 DIAGNOSIS — R5383 Other fatigue: Secondary | ICD-10-CM | POA: Insufficient documentation

## 2015-11-02 LAB — CBC WITH DIFFERENTIAL/PLATELET
BASOS PCT: 0.4 % (ref 0.0–3.0)
Basophils Absolute: 0 10*3/uL (ref 0.0–0.1)
EOS PCT: 3.6 % (ref 0.0–5.0)
Eosinophils Absolute: 0.1 10*3/uL (ref 0.0–0.7)
HEMATOCRIT: 36 % (ref 36.0–46.0)
HEMOGLOBIN: 12 g/dL (ref 12.0–15.0)
LYMPHS PCT: 25 % (ref 12.0–46.0)
Lymphs Abs: 1 10*3/uL (ref 0.7–4.0)
MCHC: 33.3 g/dL (ref 30.0–36.0)
MCV: 98.1 fl (ref 78.0–100.0)
MONOS PCT: 8.7 % (ref 3.0–12.0)
Monocytes Absolute: 0.3 10*3/uL (ref 0.1–1.0)
NEUTROS ABS: 2.4 10*3/uL (ref 1.4–7.7)
Neutrophils Relative %: 62.3 % (ref 43.0–77.0)
PLATELETS: 222 10*3/uL (ref 150.0–400.0)
RBC: 3.67 Mil/uL — ABNORMAL LOW (ref 3.87–5.11)
RDW: 13.6 % (ref 11.5–15.5)
WBC: 3.8 10*3/uL — ABNORMAL LOW (ref 4.0–10.5)

## 2015-11-02 LAB — BASIC METABOLIC PANEL
BUN: 9 mg/dL (ref 6–23)
CO2: 25 mEq/L (ref 19–32)
CREATININE: 0.63 mg/dL (ref 0.40–1.20)
Calcium: 9.1 mg/dL (ref 8.4–10.5)
Chloride: 106 mEq/L (ref 96–112)
GFR: 120.35 mL/min (ref >60.00)
GLUCOSE: 104 mg/dL — AB (ref 70–99)
Potassium: 3.4 mEq/L — ABNORMAL LOW (ref 3.5–5.1)
Sodium: 138 mEq/L (ref 135–145)

## 2015-11-02 LAB — VITAMIN D 25 HYDROXY (VIT D DEFICIENCY, FRACTURES): VITD: 25.84 ng/mL — ABNORMAL LOW (ref 30.00–100.00)

## 2015-11-02 LAB — URINALYSIS
BILIRUBIN URINE: NEGATIVE
HGB URINE DIPSTICK: NEGATIVE
Ketones, ur: NEGATIVE
LEUKOCYTES UA: NEGATIVE
NITRITE: NEGATIVE
PH: 6.5 (ref 5.0–8.0)
Specific Gravity, Urine: <=1.005 — AB (ref 1.000–1.030)
Total Protein, Urine: NEGATIVE
UROBILINOGEN UA: 0.2 (ref 0.0–1.0)
Urine Glucose: NEGATIVE

## 2015-11-02 LAB — SEDIMENTATION RATE: SED RATE: 14 mm/h (ref 0–22)

## 2015-11-02 LAB — HEPATIC FUNCTION PANEL
ALT: 17 U/L (ref 0–35)
AST: 19 U/L (ref 0–37)
Albumin: 3.8 g/dL (ref 3.5–5.2)
Alkaline Phosphatase: 81 U/L (ref 39–117)
BILIRUBIN DIRECT: 0 mg/dL (ref 0.0–0.3)
TOTAL PROTEIN: 7.3 g/dL (ref 6.0–8.3)
Total Bilirubin: 0.4 mg/dL (ref 0.2–1.2)

## 2015-11-02 LAB — VITAMIN B12: Vitamin B-12: 610 pg/mL (ref 211–911)

## 2015-11-02 LAB — IBC PANEL
Iron: 353 ug/dL — ABNORMAL HIGH (ref 42–145)
Saturation Ratios: 80 % — ABNORMAL HIGH (ref 20.0–50.0)
TRANSFERRIN: 315 mg/dL (ref 212.0–360.0)

## 2015-11-02 LAB — TSH: TSH: 0.39 u[IU]/mL (ref 0.35–4.50)

## 2015-11-02 LAB — CORTISOL: CORTISOL PLASMA: 6.8 ug/dL

## 2015-11-02 MED ORDER — HYDROCORTISONE ACETATE 30 MG RE SUPP: RECTAL | Status: DC

## 2015-11-02 NOTE — Assessment & Plan Note (Signed)
Labs On iron 

## 2015-11-02 NOTE — Assessment & Plan Note (Signed)
2/17 ?etiology Labs

## 2015-11-02 NOTE — Progress Notes (Signed)
Subjective:  Patient ID: Kelly Santos, female    DOB: 1946-09-18  Age: 70 y.o. MRN: [id number]  CC: No chief complaint on file.   HPI Kelly Santos presents for rectal blood w/BMs x long time - worse lately. Last colon 2016. C/o fatigue x2 weeks.C/o R shoulder blade pain  Outpatient Prescriptions Prior to Visit  Medication Sig Dispense Refill  . amitriptyline (ELAVIL) 25 MG tablet TAKE 1 TABLET BY MOUTH EVERY DAY AS NEEDED 30 tablet 3  . amLODipine-benazepril (LOTREL) 5-40 MG capsule TAKE 1 CAPSULE BY MOUTH DAILY. 90 capsule 3  . brimonidine-timolol (COMBIGAN) 0.2-0.5 % ophthalmic solution Place 1 drop into both eyes 2 (two) times daily.    . cholecalciferol (VITAMIN D) 1000 UNITS tablet Take 1,000 Units by mouth daily.    . Ferrous Sulfate (IRON) 325 (65 FE) MG TABS Take 1 tablet by mouth daily.    . hydrocortisone 2.5 % ointment Apply topically 2 (two) times daily. 30 g 3  . ibuprofen (ADVIL,MOTRIN) 600 MG tablet Take 1 tablet (600 mg total) by mouth every 6 (six) hours as needed (mild pain). 60 tablet 3  . potassium chloride SA (KLOR-CON M20) 20 MEQ tablet Take 1 tablet (20 mEq total) by mouth 2 (two) times daily. 90 tablet 2  . promethazine-codeine (PHENERGAN WITH CODEINE) 6.25-10 MG/5ML syrup Take 5 mLs by mouth every 4 (four) hours as needed. 300 mL 0  . triamcinolone cream (KENALOG) 0.5 % APPLY TWICE A DAY AS NEEDED 30 g 0   No facility-administered medications prior to visit.    ROS Review of Systems  Constitutional: Positive for fatigue. Negative for chills, activity change, appetite change and unexpected weight change.  HENT: Negative for congestion, mouth sores and sinus pressure.   Eyes: Negative for visual disturbance.  Respiratory: Negative for cough, chest tightness and shortness of breath.   Cardiovascular: Negative for leg swelling.  Gastrointestinal: Positive for blood in stool and anal bleeding. Negative for nausea, vomiting, abdominal pain, diarrhea,  constipation, abdominal distention and rectal pain.  Genitourinary: Negative for frequency, difficulty urinating and vaginal pain.  Musculoskeletal: Positive for back pain. Negative for gait problem.  Skin: Negative for pallor and rash.  Neurological: Positive for weakness. Negative for dizziness, tremors, syncope, light-headedness, numbness and headaches.  Psychiatric/Behavioral: Negative for suicidal ideas, confusion and sleep disturbance. The patient is not nervous/anxious.     Objective:  BP 118/80 mmHg  Pulse 71  Wt 175 lb (79.379 kg)  SpO2 97%  BP Readings from Last 3 Encounters:  11/02/15 118/80  09/29/15 139/68  07/31/15 110/72    Wt Readings from Last 3 Encounters:  11/02/15 175 lb (79.379 kg)  09/29/15 173 lb (78.472 kg)  07/31/15 173 lb 12.8 oz (78.835 kg)    Physical Exam  Constitutional: She appears well-developed. No distress.  HENT:  Head: Normocephalic.  Right Ear: External ear normal.  Left Ear: External ear normal.  Nose: Nose normal.  Mouth/Throat: Oropharynx is clear and moist.  Eyes: Conjunctivae are normal. Pupils are equal, round, and reactive to light. Right eye exhibits no discharge. Left eye exhibits no discharge.  Neck: Normal range of motion. Neck supple. No JVD present. No tracheal deviation present. No thyromegaly present.  Cardiovascular: Normal rate, regular rhythm and normal heart sounds.   Pulmonary/Chest: No stridor. No respiratory distress. She has no wheezes.  Abdominal: Soft. Bowel sounds are normal. She exhibits no distension and no mass. There is no tenderness. There is no rebound and no guarding.  Musculoskeletal: She exhibits no edema or tenderness.  Lymphadenopathy:    She has no cervical adenopathy.  Neurological: She displays normal reflexes. No cranial nerve deficit. She exhibits normal muscle tone. Coordination normal.  Skin: No rash noted. No erythema.  Psychiatric: She has a normal mood and affect. Her behavior is normal.  Judgment and thought content normal.    Lab Results  Component Value Date   WBC 4.0 06/09/2015   HGB 11.2* 06/09/2015   HCT 33.8* 06/09/2015   PLT 192.0 06/09/2015   GLUCOSE 100* 03/24/2015   CHOL 160 02/03/2015   TRIG 81.0 02/03/2015   HDL 51.30 02/03/2015   LDLCALC 93 02/03/2015   ALT 17 02/03/2015   AST 19 02/03/2015   NA 138 03/24/2015   K 3.9 03/24/2015   CL 106 03/24/2015   CREATININE 0.69 03/24/2015   BUN 11 03/24/2015   CO2 26 03/24/2015   TSH 0.56 02/03/2015   INR 0.96 05/09/2011   HGBA1C 5.9 03/24/2015    No results found.  Assessment & Plan:   There are no diagnoses linked to this encounter. I am having Kelly Santos maintain her cholecalciferol, brimonidine-timolol, triamcinolone cream, ibuprofen, potassium chloride SA, promethazine-codeine, hydrocortisone, Iron, amitriptyline, and amLODipine-benazepril.  No orders of the defined types were placed in this encounter.     Follow-up: No Follow-up on file.  Walker Kehr, MD

## 2015-11-02 NOTE — Progress Notes (Signed)
Pre visit review using our clinic review tool, if applicable. No additional management support is needed unless otherwise documented below in the visit note. 

## 2015-11-02 NOTE — Assessment & Plan Note (Signed)
Chronic and recurrent due to hemorrhoids Dr Henrene Pastor (colon 2010, 2016)  Labs Proctocort supp

## 2015-11-02 NOTE — Assessment & Plan Note (Signed)
Labs

## 2015-11-06 ENCOUNTER — Other Ambulatory Visit: Payer: Self-pay | Admitting: Internal Medicine

## 2015-11-06 MED ORDER — ERGOCALCIFEROL 1.25 MG (50000 UT) PO CAPS: 50000.0000 [IU] | ORAL_CAPSULE | ORAL | Status: DC

## 2015-11-06 MED ORDER — POTASSIUM CHLORIDE CRYS ER 20 MEQ PO TBCR: 20.0000 meq | EXTENDED_RELEASE_TABLET | Freq: Two times a day (BID) | ORAL | Status: DC

## 2015-11-07 ENCOUNTER — Other Ambulatory Visit: Payer: Self-pay

## 2015-12-14 ENCOUNTER — Ambulatory Visit (INDEPENDENT_AMBULATORY_CARE_PROVIDER_SITE_OTHER): Payer: BLUE CROSS/BLUE SHIELD | Admitting: Family Medicine

## 2015-12-14 VITALS — BP 130/80 | HR 64 | Temp 98.7°F | Resp 16 | Ht 65.0 in | Wt 173.0 lb

## 2015-12-14 DIAGNOSIS — J310 Chronic rhinitis: Secondary | ICD-10-CM

## 2015-12-14 DIAGNOSIS — J209 Acute bronchitis, unspecified: Secondary | ICD-10-CM

## 2015-12-14 DIAGNOSIS — J329 Chronic sinusitis, unspecified: Secondary | ICD-10-CM

## 2015-12-14 MED ORDER — AMOXICILLIN-POT CLAVULANATE 875-125 MG PO TABS: 1.0000 | ORAL_TABLET | Freq: Two times a day (BID) | ORAL | Status: DC

## 2015-12-14 MED ORDER — BENZONATATE 100 MG PO CAPS: 100.0000 mg | ORAL_CAPSULE | Freq: Three times a day (TID) | ORAL | Status: DC | PRN

## 2015-12-14 NOTE — Progress Notes (Signed)
Patient ID: Kelly Santos, female    DOB: [birth year]  Age: 70 y.o. MRN: [id number]  Chief Complaint  Patient presents with  . Sinus Problem    x 5 days   . Cough    Subjective:   70 year old lady who works at [employer]. She has been ill for the last 5 or 6 days with a respiratory tract infection, now developing more of a cough. She has not been running fevers. She has blown and coughed up mucus, sometimes with some blood in it. She does not smoke. She has a history of getting some pretty bad sinus infections in the past. She did not get a flu shot this year. She just felt like she wanted to get checked before this got any worse since the cough, on.  Current allergies, medications, problem list, past/family and social histories reviewed.  Objective:  BP 130/80 mmHg  Pulse 64  Temp(Src) 98.7 F (37.1 C) (Oral)  Resp 16  Ht 5\' 5"  (1.651 m)  Wt 173 lb (78.472 kg)  BMI 28.79 kg/m2  SpO2 99%  Pleasant lady in no major distress. Her TMs are normal. Throat clear. Neck supple without significant nodes. Chest is clear to auscultation. Heart regular without murmur.  Assessment & Plan:   Assessment: 1. Rhinosinusitis   2. Acute bronchitis, unspecified organism       Plan: This appears to be a sinusitis/bronchitis type problem. Will go ahead and put her on a course of antibiotics and give her something for the cough. No testing is needed today. She is to return if worse.  No orders of the defined types were placed in this encounter.    Meds ordered this encounter  Medications  . amoxicillin-clavulanate (AUGMENTIN) 875-125 MG tablet    Sig: Take 1 tablet by mouth 2 (two) times daily.    Dispense:  20 tablet    Refill:  0  . benzonatate (TESSALON) 100 MG capsule    Sig: Take 1-2 capsules (100-200 mg total) by mouth 3 (three) times daily as needed.    Dispense:  30 capsule    Refill:  0         Patient Instructions  Drink plenty of fluids and get enough rest  Take the  amoxicillin/clavulanate (Augmentin) 875 mg twice daily  Take the benzonatate cough pills one or 2 pills 3 times daily as needed for cough  If needed get some over-the-counter Robitussin-DM or Delsym cough syrup for nighttime cough  If the head is not opening up you can take an over-the-counter Claritin-D or Allegra-D or Zyrtec-D  If getting worse at any time or not improving please return   IF you received an x-ray today, you will receive an invoice from [facility]. Please contact [facility] at [phone] with questions or concerns regarding your invoice.   IF you received labwork today, you will receive an invoice from [employer]. Please contact [facility] at [phone] with questions or concerns regarding your invoice.   Our billing staff will not be able to assist you with questions regarding bills from these companies.  You will be contacted with the lab results as soon as they are available. The fastest way to get your results is to activate your My Chart account. Instructions are located on the last page of this paperwork. If you have not heard from [place] regarding the results in 2 weeks, please contact this office.      Return if symptoms worsen or fail to  improve.   HOPPER,DAVID, MD 12/14/2015

## 2015-12-14 NOTE — Patient Instructions (Addendum)
Drink plenty of fluids and get enough rest  Take the amoxicillin/clavulanate (Augmentin) 875 mg twice daily  Take the benzonatate cough pills one or 2 pills 3 times daily as needed for cough  If needed get some over-the-counter Robitussin-DM or Delsym cough syrup for nighttime cough  If the head is not opening up you can take an over-the-counter Claritin-D or Allegra-D or Zyrtec-D  If getting worse at any time or not improving please return   IF you received an x-ray today, you will receive an invoice from [facility]. Please contact [facility] at [phone] with questions or concerns regarding your invoice.   IF you received labwork today, you will receive an invoice from [employer]. Please contact [facility] at [phone] with questions or concerns regarding your invoice.   Our billing staff will not be able to assist you with questions regarding bills from these companies.  You will be contacted with the lab results as soon as they are available. The fastest way to get your results is to activate your My Chart account. Instructions are located on the last page of this paperwork. If you have not heard from [place] regarding the results in 2 weeks, please contact this office.

## 2015-12-28 ENCOUNTER — Other Ambulatory Visit: Payer: Self-pay | Admitting: Internal Medicine

## 2015-12-30 ENCOUNTER — Ambulatory Visit (HOSPITAL_COMMUNITY)
Admission: RE | Admit: 2015-12-30 | Discharge: 2015-12-30 | Disposition: A | Discharge status: Home / Self Care | Payer: BLUE CROSS/BLUE SHIELD | Source: Ambulatory Visit | Attending: Family Medicine | Admitting: Family Medicine

## 2015-12-30 ENCOUNTER — Ambulatory Visit (INDEPENDENT_AMBULATORY_CARE_PROVIDER_SITE_OTHER): Payer: BLUE CROSS/BLUE SHIELD | Admitting: Physician Assistant

## 2015-12-30 ENCOUNTER — Ambulatory Visit (INDEPENDENT_AMBULATORY_CARE_PROVIDER_SITE_OTHER): Payer: BLUE CROSS/BLUE SHIELD

## 2015-12-30 VITALS — BP 130/86 | HR 66 | Temp 98.1°F | Resp 16 | Ht 65.0 in | Wt 172.8 lb

## 2015-12-30 DIAGNOSIS — S86812A Strain of other muscle(s) and tendon(s) at lower leg level, left leg, initial encounter: Secondary | ICD-10-CM

## 2015-12-30 DIAGNOSIS — M79609 Pain in unspecified limb: Secondary | ICD-10-CM

## 2015-12-30 DIAGNOSIS — S86112A Strain of other muscle(s) and tendon(s) of posterior muscle group at lower leg level, left leg, initial encounter: Secondary | ICD-10-CM

## 2015-12-30 DIAGNOSIS — M7989 Other specified soft tissue disorders: Secondary | ICD-10-CM | POA: Diagnosis not present

## 2015-12-30 DIAGNOSIS — M79605 Pain in left leg: Secondary | ICD-10-CM | POA: Insufficient documentation

## 2015-12-30 NOTE — Progress Notes (Signed)
[facility] [address], [place] [address] [phone]  Date:  12/30/2015   Name:  Kelly Santos   DOB:  08/16/1946   MRN:  [id number]  PCP:  Walker Kehr, MD    Chief Complaint: Leg Pain   History of Present Illness:  This is a 70 y.o. female with PMH OA, CAD, HTN, hemorrhoids, anemia who is presenting with left leg and knee pain x 4-5 days. Most of pain in anterior/lateral and posterior knee. Occ gets pain in medial thigh as well. No pain with rest. Has pain with activity mostly. Pain described as tightness and throbbing. States her left leg has always been a little bigger than the right, but does seem bigger than normal today. Does have OA in her hands but has never had problems with her knees before. NKI. Tried tylenol for her pain but no help. She has never had blood clots before. Her son has had DVTs and PEs. No recent travel or surgeries. She is not on estrogen. Denies sob, cp, palps.  She works as a Quarry manager.  Review of Systems:  Review of Systems See HPI  Patient Active Problem List   Diagnosis Date Noted  . Fatigue 11/02/2015  . Hyperglycemia 03/24/2015  . Colon polyp, hyperplastic 11/27/2012  . LBP (low back pain) 08/03/2012  . Foot pain, left 04/14/2012  . Callus of foot 04/14/2012  . Leg pain, left 07/09/2011  . Diastolic dysfunction 0000000  . Hypokalemia 05/15/2011  . HYPERTHYROIDISM 03/26/2010  . RECTAL BLEEDING 03/26/2010  . HEMORRHOIDS 03/23/2010  . Anemia due to chronic blood loss 03/23/2010  . HERPES ZOSTER 11/09/2009  . Pain in limb 10/04/2009  . Headache(784.0) 10/04/2009  . TINEA PEDIS 06/20/2009  . ALLERGIC RHINITIS 12/15/2008  . CERVICAL RADICULOPATHY, RIGHT 03/25/2008  . KNEE PAIN, ACUTE 01/18/2008  . Vitamin D deficiency 11/21/2007  . ABNORMAL CHEST XRAY 11/21/2007  . Essential hypertension 11/18/2007  . CORONARY ARTERY DISEASE 11/18/2007  . GERD 11/18/2007  . DIVERTICULOSIS, COLON 11/18/2007  . OSTEOARTHRITIS  11/18/2007  . PARESTHESIA 11/18/2007    Prior to Admission medications   Medication Sig Start Date End Date Taking? Authorizing Provider  amitriptyline (ELAVIL) 25 MG tablet TAKE 1 TABLET BY MOUTH EVERY DAY AS NEEDED 06/19/15  Yes Aleksei Plotnikov V, MD  amLODipine-benazepril (LOTREL) 5-40 MG capsule TAKE 1 CAPSULE BY MOUTH DAILY. 08/21/15  Yes Aleksei Plotnikov V, MD  brimonidine-timolol (COMBIGAN) 0.2-0.5 % ophthalmic solution Place 1 drop into both eyes 2 (two) times daily.   Yes Historical Provider, MD  cholecalciferol (VITAMIN D) 1000 UNITS tablet Take 1,000 Units by mouth daily.   Yes Historical Provider, MD  Ferrous Sulfate (IRON) 325 (65 FE) MG TABS Take 1 tablet by mouth daily.   Yes Historical Provider, MD  ibuprofen (ADVIL,MOTRIN) 600 MG tablet Take 1 tablet (600 mg total) by mouth every 6 (six) hours as needed (mild pain). 01/31/15  Yes Aleksei Plotnikov V, MD  potassium chloride SA (KLOR-CON M20) 20 MEQ tablet Take 1 tablet (20 mEq total) by mouth 2 (two) times daily. 11/06/15  Yes Aleksei Plotnikov V, MD                  No Known Allergies  Past Surgical History  Procedure Laterality Date  . Dilation and curettage of uterus  01/18/2013    with uterine polypectomy  . Abdominal hysterectomy N/A 04/26/2013    Procedure: HYSTERECTOMY ABDOMINAL;  Surgeon: Cyril Mourning, MD;  Location: [facility]  ORS;  Service: Gynecology;  Laterality: N/A;  . Salpingoophorectomy Bilateral 04/26/2013    Procedure: SALPINGO OOPHORECTOMY;  Surgeon: Cyril Mourning, MD;  Location: [facility] ORS;  Service: Gynecology;  Laterality: Bilateral;    Social History  Substance Use Topics  . Smoking status: Never Smoker   . Smokeless tobacco: Never Used  . Alcohol Use: No    Family History  Problem Relation Age of Onset  . Colon cancer Neg Hx   . Hypertension Mother   . Hypertension Father   . Deep vein thrombosis Son 35    x 2, chronic coumadin    Medication list has been reviewed and updated.  Physical  Examination:  Physical Exam  Constitutional: She is oriented to person, place, and time. She appears well-developed and well-nourished. No distress.  HENT:  Head: Normocephalic and atraumatic.  Right Ear: Hearing normal.  Left Ear: Hearing normal.  Nose: Nose normal.  Eyes: Conjunctivae and lids are normal. Right eye exhibits no discharge. Left eye exhibits no discharge. No scleral icterus.  Cardiovascular: Normal rate, regular rhythm, normal heart sounds and normal pulses.   No murmur heard. Pulmonary/Chest: Effort normal and breath sounds normal. No respiratory distress. She has no wheezes. She has no rhonchi. She has no rales.  Musculoskeletal: Normal range of motion.       Right knee: Normal.       Left knee: She exhibits normal range of motion and no bony tenderness. Tenderness (popliteal space. mild medial and lateral joint line.) found. No patellar tendon tenderness noted.       Right upper leg: Normal.       Left upper leg: She exhibits tenderness (medial mid thigh).  Right calf measures 39 cm, left calf measures 42 cm. Left calf mild tenderness. There are varicosities in left calf, none in right. Homans negative.  Neurological: She is alert and oriented to person, place, and time.  Skin: Skin is warm, dry and intact. No lesion and no rash noted.  Psychiatric: She has a normal mood and affect. Her speech is normal and behavior is normal. Thought content normal.   BP 130/86 mmHg  Pulse 66  Temp(Src) 98.1 F (36.7 C) (Oral)  Resp 16  Ht 5\' 5"  (1.651 m)  Wt 172 lb 12.8 oz (78.382 kg)  BMI 28.76 kg/m2  SpO2 98%  Dg Knee Complete 4 Views Left  12/30/2015  CLINICAL DATA:  Acute left knee pain.  No reported injury. EXAM: LEFT KNEE - COMPLETE 4+ VIEW COMPARISON:  None. FINDINGS: There is no evidence of fracture, dislocation, or joint effusion. There is no evidence of arthropathy or other focal bone abnormality. Soft tissues are unremarkable. IMPRESSION: Normal left knee.  Electronically Signed   By: Marijo Conception, M.D.   On: 12/30/2015 10:44   Assessment and Plan:  1. Popliteal pain 2. Calf swelling 3. Gastrocnemius muscle tear, left, initial encounter Calf swelling and popliteal pain. Left calf 3 inches bigger than right. Knee radiograph normal. Doppler u/s without evidence of clot but there is evidence of calf muscle tear. Will refer to ortho for further eval. - DG Knee Complete 4 Views Left; Future - VAS [place] LOWER EXTREMITY VENOUS (DVT); Future   Benjaman Pott. Drenda Freeze, MHS [facility]  12/30/2015

## 2015-12-30 NOTE — Patient Instructions (Addendum)
YOU ARE TO GO OVER TO [facility] NOW FOR YOUR DOPPLER.  LET THEM KNOW THAT YOU WOULD LIKE TO REGISTER AS AN OUTPATIENT FOR YOUR VENOUS DOPPLER    IF you received an x-ray today, you will receive an invoice from [facility]. Please contact [facility] Radiology at [phone] with questions or concerns regarding your invoice.   IF you received labwork today, you will receive an invoice from [employer]. Please contact [facility] at [phone] with questions or concerns regarding your invoice.   Our billing staff will not be able to assist you with questions regarding bills from these companies.  You will be contacted with the lab results as soon as they are available. The fastest way to get your results is to activate your My Chart account. Instructions are located on the last page of this paperwork. If you have not heard from [place] regarding the results in 2 weeks, please contact this office.

## 2015-12-30 NOTE — Progress Notes (Signed)
VASCULAR LAB PRELIMINARY  PRELIMINARY  PRELIMINARY  PRELIMINARY  Left lower extremity venous duplex completed.    Preliminary report:  There is no DVT or SVT noted in the left lower extremity.  There are multiple varicosities noted in the calf, but none are thrombosed.  Cannot rule out small muscle tear in mid calf at site of pain.  Rayleen Wyrick, RVT 12/30/2015, 11:55 AM

## 2016-01-03 ENCOUNTER — Encounter: Payer: Self-pay | Admitting: Internal Medicine

## 2016-01-22 ENCOUNTER — Encounter: Payer: Self-pay | Admitting: Internal Medicine

## 2016-01-22 ENCOUNTER — Ambulatory Visit (INDEPENDENT_AMBULATORY_CARE_PROVIDER_SITE_OTHER): Payer: BLUE CROSS/BLUE SHIELD | Admitting: Internal Medicine

## 2016-01-22 VITALS — BP 130/80 | HR 72 | Temp 98.3°F | Ht 65.0 in | Wt 172.0 lb

## 2016-01-22 DIAGNOSIS — L309 Dermatitis, unspecified: Secondary | ICD-10-CM | POA: Diagnosis not present

## 2016-01-22 MED ORDER — TRIAMCINOLONE ACETONIDE 0.5 % EX CREA: TOPICAL_CREAM | Freq: Two times a day (BID) | CUTANEOUS | Status: DC | PRN

## 2016-01-22 NOTE — Assessment & Plan Note (Signed)
Kenalog prn Hold off cosmetics

## 2016-01-22 NOTE — Progress Notes (Signed)
Pre visit review using our clinic review tool, if applicable. No additional management support is needed unless otherwise documented below in the visit note. 

## 2016-01-22 NOTE — Progress Notes (Signed)
Subjective:  Patient ID: Kelly Santos, female    DOB: 10/10/1945  Age: 70 y.o. MRN: [id number]  CC: Rash   HPI Kelly Santos presents for rash on the face off and on - weeks: dry patches  Outpatient Prescriptions Prior to Visit  Medication Sig Dispense Refill  . amitriptyline (ELAVIL) 25 MG tablet TAKE 1 TABLET BY MOUTH EVERY DAY AS NEEDED 30 tablet 3  . amLODipine-benazepril (LOTREL) 5-40 MG capsule TAKE 1 CAPSULE BY MOUTH DAILY. 90 capsule 3  . brimonidine-timolol (COMBIGAN) 0.2-0.5 % ophthalmic solution Place 1 drop into both eyes 2 (two) times daily.    . cholecalciferol (VITAMIN D) 1000 UNITS tablet Take 1,000 Units by mouth daily.    . Ferrous Sulfate (IRON) 325 (65 FE) MG TABS Take 1 tablet by mouth daily.    Marland Kitchen HYDROCORTISONE ACE, RECTAL, (PROCTOCORT) 30 MG SUPP 1 pr bid 14 each 1  . ibuprofen (ADVIL,MOTRIN) 600 MG tablet Take 1 tablet (600 mg total) by mouth every 6 (six) hours as needed (mild pain). 60 tablet 3  . potassium chloride SA (KLOR-CON M20) 20 MEQ tablet Take 1 tablet (20 mEq total) by mouth 2 (two) times daily. 90 tablet 2  . triamcinolone cream (KENALOG) 0.5 % APPLY TWICE A DAY AS NEEDED 30 g 0   No facility-administered medications prior to visit.    ROS Review of Systems  Constitutional: Negative for chills, activity change, appetite change, fatigue and unexpected weight change.  HENT: Negative for congestion, mouth sores and sinus pressure.   Eyes: Negative for visual disturbance.  Respiratory: Negative for cough and chest tightness.   Gastrointestinal: Negative for nausea and abdominal pain.  Genitourinary: Negative for frequency, difficulty urinating and vaginal pain.  Musculoskeletal: Negative for back pain and gait problem.  Skin: Positive for rash. Negative for pallor.  Neurological: Negative for dizziness, tremors, weakness, numbness and headaches.  Psychiatric/Behavioral: Negative for suicidal ideas, confusion and sleep disturbance.    Objective:   BP 130/80 mmHg  Pulse 72  Temp(Src) 98.3 F (36.8 C) (Oral)  Ht 5\' 5"  (1.651 m)  Wt 172 lb (78.019 kg)  BMI 28.62 kg/m2  SpO2 98%  BP Readings from Last 3 Encounters:  01/22/16 130/80  12/30/15 130/86  12/14/15 130/80    Wt Readings from Last 3 Encounters:  01/22/16 172 lb (78.019 kg)  12/30/15 172 lb 12.8 oz (78.382 kg)  12/14/15 173 lb (78.472 kg)    Physical Exam  Constitutional: She appears well-developed. No distress.  HENT:  Head: Normocephalic.  Right Ear: External ear normal.  Left Ear: External ear normal.  Nose: Nose normal.  Mouth/Throat: Oropharynx is clear and moist.  Eyes: Conjunctivae are normal. Pupils are equal, round, and reactive to light. Right eye exhibits no discharge. Left eye exhibits no discharge.  Neck: Normal range of motion. Neck supple. No JVD present. No tracheal deviation present. No thyromegaly present.  Cardiovascular: Normal rate, regular rhythm and normal heart sounds.   Pulmonary/Chest: No stridor. No respiratory distress. She has no wheezes.  Abdominal: Soft. Bowel sounds are normal. She exhibits no distension and no mass. There is no tenderness. There is no rebound and no guarding.  Musculoskeletal: She exhibits no edema or tenderness.  Lymphadenopathy:    She has no cervical adenopathy.  Neurological: She displays normal reflexes. No cranial nerve deficit. She exhibits normal muscle tone. Coordination normal.  Skin: Rash noted. No erythema.  Psychiatric: She has a normal mood and affect. Her behavior is normal. Judgment  and thought content normal.  dry patches on face, around eyes  Lab Results  Component Value Date   WBC 3.8* 11/02/2015   HGB 12.0 11/02/2015   HCT 36.0 11/02/2015   PLT 222.0 11/02/2015   GLUCOSE 104* 11/02/2015   CHOL 160 02/03/2015   TRIG 81.0 02/03/2015   HDL 51.30 02/03/2015   LDLCALC 93 02/03/2015   ALT 17 11/02/2015   AST 19 11/02/2015   NA 138 11/02/2015   K 3.4* 11/02/2015   CL 106 11/02/2015    CREATININE 0.63 11/02/2015   BUN 9 11/02/2015   CO2 25 11/02/2015   TSH 0.39 11/02/2015   INR 0.96 05/09/2011   HGBA1C 5.9 03/24/2015    Dg Knee Complete 4 Views Left  12/30/2015  CLINICAL DATA:  Acute left knee pain.  No reported injury. EXAM: LEFT KNEE - COMPLETE 4+ VIEW COMPARISON:  None. FINDINGS: There is no evidence of fracture, dislocation, or joint effusion. There is no evidence of arthropathy or other focal bone abnormality. Soft tissues are unremarkable. IMPRESSION: Normal left knee. Electronically Signed   By: Marijo Conception, M.D.   On: 12/30/2015 10:44    Assessment & Plan:   There are no diagnoses linked to this encounter. I am having Ms. Doverspike maintain her cholecalciferol, brimonidine-timolol, triamcinolone cream, ibuprofen, Iron, amitriptyline, amLODipine-benazepril, HYDROCORTISONE ACE (RECTAL), and potassium chloride SA.  No orders of the defined types were placed in this encounter.     Follow-up: No Follow-up on file.  Walker Kehr, MD

## 2016-02-20 ENCOUNTER — Other Ambulatory Visit: Payer: Self-pay | Admitting: Internal Medicine

## 2016-03-05 ENCOUNTER — Other Ambulatory Visit: Payer: Self-pay | Admitting: Internal Medicine

## 2016-04-01 ENCOUNTER — Other Ambulatory Visit: Payer: Self-pay

## 2016-04-01 MED ORDER — AMITRIPTYLINE HCL 25 MG PO TABS: 25.0000 mg | ORAL_TABLET | Freq: Every day | ORAL | Status: DC | PRN

## 2016-04-23 ENCOUNTER — Ambulatory Visit (INDEPENDENT_AMBULATORY_CARE_PROVIDER_SITE_OTHER): Payer: BLUE CROSS/BLUE SHIELD

## 2016-04-23 ENCOUNTER — Ambulatory Visit (INDEPENDENT_AMBULATORY_CARE_PROVIDER_SITE_OTHER): Payer: BLUE CROSS/BLUE SHIELD | Admitting: Urgent Care

## 2016-04-23 VITALS — BP 110/76 | HR 84 | Temp 98.9°F | Resp 18 | Ht 65.0 in | Wt 170.0 lb

## 2016-04-23 DIAGNOSIS — R05 Cough: Secondary | ICD-10-CM | POA: Diagnosis not present

## 2016-04-23 DIAGNOSIS — I10 Essential (primary) hypertension: Secondary | ICD-10-CM

## 2016-04-23 DIAGNOSIS — R0602 Shortness of breath: Secondary | ICD-10-CM

## 2016-04-23 DIAGNOSIS — R0982 Postnasal drip: Secondary | ICD-10-CM

## 2016-04-23 DIAGNOSIS — R0789 Other chest pain: Secondary | ICD-10-CM

## 2016-04-23 DIAGNOSIS — J329 Chronic sinusitis, unspecified: Secondary | ICD-10-CM

## 2016-04-23 DIAGNOSIS — R059 Cough, unspecified: Secondary | ICD-10-CM

## 2016-04-23 LAB — POCT CBC
Granulocyte percent: 65 %G (ref 37–80)
HEMATOCRIT: 29.8 % — AB (ref 37.7–47.9)
Hemoglobin: 10.2 g/dL — AB (ref 12.2–16.2)
Lymph, poc: 0.8 (ref 0.6–3.4)
MCH, POC: 32.5 pg — AB (ref 27–31.2)
MCHC: 34.2 g/dL (ref 31.8–35.4)
MCV: 94.9 fL (ref 80–97)
MID (cbc): 0.3 (ref 0–0.9)
MPV: 7.1 fL (ref 0–99.8)
POC GRANULOCYTE: 2 (ref 2–6.9)
POC LYMPH PERCENT: 26.2 %L (ref 10–50)
POC MID %: 8.8 %M (ref 0–12)
Platelet Count, POC: 179 10*3/uL (ref 142–424)
RBC: 3.14 M/uL — AB (ref 4.04–5.48)
RDW, POC: 13.2 %
WBC: 3.1 10*3/uL — AB (ref 4.6–10.2)

## 2016-04-23 LAB — BRAIN NATRIURETIC PEPTIDE: BRAIN NATRIURETIC PEPTIDE: 46.1 pg/mL (ref <100)

## 2016-04-23 MED ORDER — BENZONATATE 100 MG PO CAPS: 100.0000 mg | ORAL_CAPSULE | Freq: Three times a day (TID) | ORAL | 0 refills | Status: DC | PRN

## 2016-04-23 MED ORDER — HYDROCODONE-HOMATROPINE 5-1.5 MG/5ML PO SYRP: 5.0000 mL | ORAL_SOLUTION | Freq: Every evening | ORAL | 0 refills | Status: DC | PRN

## 2016-04-23 MED ORDER — CETIRIZINE HCL 10 MG PO TABS: 10.0000 mg | ORAL_TABLET | Freq: Every day | ORAL | 11 refills | Status: DC

## 2016-04-23 NOTE — Patient Instructions (Addendum)
Cough, Adult Coughing is a reflex that clears your throat and your airways. Coughing helps to heal and protect your lungs. It is normal to cough occasionally, but a cough that happens with other symptoms or lasts a long time may be a sign of a condition that needs treatment. A cough may last only 2-3 weeks (acute), or it may last longer than 8 weeks (chronic). CAUSES Coughing is commonly caused by:  Breathing in substances that irritate your lungs.  A viral or bacterial respiratory infection.  Allergies.  Asthma.  Postnasal drip.  Smoking.  Acid backing up from the stomach into the esophagus (gastroesophageal reflux).  Certain medicines.  Chronic lung problems, including COPD (or rarely, lung cancer).  Other medical conditions such as heart failure. HOME CARE INSTRUCTIONS  Pay attention to any changes in your symptoms. Take these actions to help with your discomfort:  Take medicines only as told by your health care provider.  If you were prescribed an antibiotic medicine, take it as told by your health care provider. Do not stop taking the antibiotic even if you start to feel better.  Talk with your health care provider before you take a cough suppressant medicine.  Drink enough fluid to keep your urine clear or pale yellow.  If the air is dry, use a cold steam vaporizer or humidifier in your bedroom or your home to help loosen secretions.  Avoid anything that causes you to cough at work or at home.  If your cough is worse at night, try sleeping in a semi-upright position.  Avoid cigarette smoke. If you smoke, quit smoking. If you need help quitting, ask your health care provider.  Avoid caffeine.  Avoid alcohol.  Rest as needed. SEEK MEDICAL CARE IF:   You have new symptoms.  You cough up pus.  Your cough does not get better after 2-3 weeks, or your cough gets worse.  You cannot control your cough with suppressant medicines and you are losing sleep.  You  develop pain that is getting worse or pain that is not controlled with pain medicines.  You have a fever.  You have unexplained weight loss.  You have night sweats. SEEK IMMEDIATE MEDICAL CARE IF:  You cough up blood.  You have difficulty breathing.  Your heartbeat is very fast.   This information is not intended to replace advice given to you by your health care provider. Make sure you discuss any questions you have with your health care provider.   Document Released: 03/15/2011 Document Revised: 06/07/2015 Document Reviewed: 11/23/2014 Elsevier Interactive Patient Education 2016 Elsevier Inc.     Nonspecific Chest Pain  Chest pain can be caused by many different conditions. There is always a chance that your pain could be related to something serious, such as a heart attack or a blood clot in your lungs. Chest pain can also be caused by conditions that are not life-threatening. If you have chest pain, it is very important to follow up with your health care provider. CAUSES  Chest pain can be caused by:  Heartburn.  Pneumonia or bronchitis.  Anxiety or stress.  Inflammation around your heart (pericarditis) or lung (pleuritis or pleurisy).  A blood clot in your lung.  A collapsed lung (pneumothorax). It can develop suddenly on its own (spontaneous pneumothorax) or from trauma to the chest.  Shingles infection (varicella-zoster virus).  Heart attack.  Damage to the bones, muscles, and cartilage that make up your chest wall. This can include:  Bruised bones  due to injury.  Strained muscles or cartilage due to frequent or repeated coughing or overwork.  Fracture to one or more ribs.  Sore cartilage due to inflammation (costochondritis). RISK FACTORS  Risk factors for chest pain may include:  Activities that increase your risk for trauma or injury to your chest.  Respiratory infections or conditions that cause frequent coughing.  Medical conditions or  overeating that can cause heartburn.  Heart disease or family history of heart disease.  Conditions or health behaviors that increase your risk of developing a blood clot.  Having had chicken pox (varicella zoster). SIGNS AND SYMPTOMS Chest pain can feel like:  Burning or tingling on the surface of your chest or deep in your chest.  Crushing, pressure, aching, or squeezing pain.  Dull or sharp pain that is worse when you move, cough, or take a deep breath.  Pain that is also felt in your back, neck, shoulder, or arm, or pain that spreads to any of these areas. Your chest pain may come and go, or it may stay constant. DIAGNOSIS Lab tests or other studies may be needed to find the cause of your pain. Your health care provider may have you take a test called an ambulatory ECG (electrocardiogram). An ECG records your heartbeat patterns at the time the test is performed. You may also have other tests, such as:  Transthoracic echocardiogram (TTE). During echocardiography, sound waves are used to create a picture of all of the heart structures and to look at how blood flows through your heart.  Transesophageal echocardiogram (TEE).This is a more advanced imaging test that obtains images from inside your body. It allows your health care provider to see your heart in finer detail.  Cardiac monitoring. This allows your health care provider to monitor your heart rate and rhythm in real time.  Holter monitor. This is a portable device that records your heartbeat and can help to diagnose abnormal heartbeats. It allows your health care provider to track your heart activity for several days, if needed.  Stress tests. These can be done through exercise or by taking medicine that makes your heart beat more quickly.  Blood tests.  Imaging tests. TREATMENT  Your treatment depends on what is causing your chest pain. Treatment may include:  Medicines. These may include:  Acid blockers for  heartburn.  Anti-inflammatory medicine.  Pain medicine for inflammatory conditions.  Antibiotic medicine, if an infection is present.  Medicines to dissolve blood clots.  Medicines to treat coronary artery disease.  Supportive care for conditions that do not require medicines. This may include:  Resting.  Applying heat or cold packs to injured areas.  Limiting activities until pain decreases. HOME CARE INSTRUCTIONS  If you were prescribed an antibiotic medicine, finish it all even if you start to feel better.  Avoid any activities that bring on chest pain.  Do not use any tobacco products, including cigarettes, chewing tobacco, or electronic cigarettes. If you need help quitting, ask your health care provider.  Do not drink alcohol.  Take medicines only as directed by your health care provider.  Keep all follow-up visits as directed by your health care provider. This is important. This includes any further testing if your chest pain does not go away.  If heartburn is the cause for your chest pain, you may be told to keep your head raised (elevated) while sleeping. This reduces the chance that acid will go from your stomach into your esophagus.  Make lifestyle changes as  directed by your health care provider. These may include:  Getting regular exercise. Ask your health care provider to suggest some activities that are safe for you.  Eating a heart-healthy diet. A registered dietitian can help you to learn healthy eating options.  Maintaining a healthy weight.  Managing diabetes, if necessary.  Reducing stress. SEEK MEDICAL CARE IF:  Your chest pain does not go away after treatment.  You have a rash with blisters on your chest.  You have a fever. SEEK IMMEDIATE MEDICAL CARE IF:   Your chest pain is worse.  You have an increasing cough, or you cough up blood.  You have severe abdominal pain.  You have severe weakness.  You faint.  You have chills.  You  have sudden, unexplained chest discomfort.  You have sudden, unexplained discomfort in your arms, back, neck, or jaw.  You have shortness of breath at any time.  You suddenly start to sweat, or your skin gets clammy.  You feel nauseous or you vomit.  You suddenly feel light-headed or dizzy.  Your heart begins to beat quickly, or it feels like it is skipping beats. These symptoms may represent a serious problem that is an emergency. Do not wait to see if the symptoms will go away. Get medical help right away. Call your local emergency services (911 in the [place] Do not drive yourself to the hospital.   This information is not intended to replace advice given to you by your health care provider. Make sure you discuss any questions you have with your health care provider.   Document Released: 06/26/2005 Document Revised: 10/07/2014 Document Reviewed: 04/22/2014 Elsevier Interactive Patient Education 2016 [employer].     IF you received an x-ray today, you will receive an invoice from [facility] Radiology. Please contact [facility] Radiology at [phone] with questions or concerns regarding your invoice.   IF you received labwork today, you will receive an invoice from [employer]. Please contact [facility] at [phone] with questions or concerns regarding your invoice.   Our billing staff will not be able to assist you with questions regarding bills from these companies.  You will be contacted with the lab results as soon as they are available. The fastest way to get your results is to activate your My Chart account. Instructions are located on the last page of this paperwork. If you have not heard from [place] regarding the results in 2 weeks, please contact this office.    We recommend that you schedule a mammogram for breast cancer screening. Typically, you do not need a referral to do this. Please contact a local imaging center to schedule your  mammogram.  [name] - [phone]  *ask for the Radiology Department [facility] - [phone] or [phone]  [facility] - [phone] [place] [phone] [facility] - [phone]  *ask for the [facility] - [phone]  *ask for the Radiology Department [facility] - [phone]  *ask for the [place] - [phone]

## 2016-04-23 NOTE — Progress Notes (Signed)
MRN: [id number] DOB: [birth year]  Subjective:   Kelly Santos is a 70 y.o. female presenting for chief complaint of Cough (Started Friday. ); Chest Pain (Left sided, started yesterday ); and Back Pain (Comes and goes, since thursday )  Reports 5 day history of malaise, left-mid lower chest pain. Chest pain lasts 15-20 minutes, radiates to her back, is intermittent, sharp in nature. She has also felt fullness in her chest, shob, pain with breathing, has had a mostly dry cough, subjective fever, nausea without vomiting, intermittent left lower leg pain. Her chest pain is worst during the day, is now using 2 pillows instead of 1 due to her cough while sleeping. Has tried Robitussin, benadryl with minimal relief. Denies sinus pain, sore throat, abdominal pain, diaphoresis, jaw pain, neck pain, arm pain. Denies smoking cigarettes.  Admits history of HTN, reports medical compliance.  Tedra has a current medication list which includes the following prescription(s): amitriptyline, amlodipine-benazepril, brimonidine-timolol, cholecalciferol, iron, hydrocortisone ace (rectal), ibuprofen, potassium chloride sa, and triamcinolone cream. Also has No Known Allergies.  Zella  has a past medical history of Anemia; CAD (coronary artery disease); Cataract; Diverticulosis; Generalized headaches; GERD (gastroesophageal reflux disease); Glaucoma; Hemorrhoids; Hiatal hernia; colonic polyps; Hypertension; Hyperthyroidism; Osteoarthritis; Uterine polyp; and Vitamin D deficiency. Also  has a past surgical history that includes Dilation and curettage of uterus (01/18/2013); Abdominal hysterectomy (N/A, 04/26/2013); and Salpingoophorectomy (Bilateral, 04/26/2013).  Objective:   Vitals: BP 110/76   Pulse 84   Temp 98.9 F (37.2 C) (Oral)   Resp 18   Ht 5\' 5"  (1.651 m)   Wt 170 lb (77.1 kg)   SpO2 99%   BMI 28.29 kg/m   Physical Exam  Constitutional: She is oriented to person, place, and time. She appears  well-developed and well-nourished.  HENT:  TM's intact bilaterally, no effusions or erythema. Nasal turbinates pink and moist, nasal passages patent. No sinus tenderness. Oropharynx clear, mucous membranes moist, dentition in good repair.  Eyes: No scleral icterus.  Neck: Normal range of motion. Neck supple. No JVD present.  Cardiovascular: Normal rate, regular rhythm and intact distal pulses.  Exam reveals no gallop and no friction rub.   No murmur heard. 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 Marginally positive Homann sign.  Pulmonary/Chest: No respiratory distress. She has no wheezes. She has no rales. She exhibits no tenderness.  Abdominal: Soft. Bowel sounds are normal. She exhibits no distension and no mass. There is no tenderness. There is no guarding.  Musculoskeletal: She exhibits no edema.  Lymphadenopathy:    She has no cervical adenopathy.  Neurological: She is alert and oriented to person, place, and time.  Skin: Skin is warm and dry.   Dg Chest 2 View  Result Date: 04/23/2016 CLINICAL DATA:  Cough, atypical chest pain, shortness of breath, history of hypertension, Coronary artery disease. EXAM: CHEST  2 VIEW COMPARISON:  PA and lateral chest x-ray of Feb 14, 2013 FINDINGS: The lungs are mildly hyperinflated with hemidiaphragm flattening. There is no focal infiltrate nor pleural effusion. The heart is top-normal in size. The pulmonary vascularity is normal. The mediastinum is normal in width. There is no pleural effusion. The bony thorax is unremarkable. IMPRESSION: 1. Mild hyperinflation may be voluntary or may reflect reactive airway disease. There is no pneumonia. 2. Mild cardiomegaly without pulmonary vascular congestion. Electronically Signed   By: David  Martinique M.D.   On: 04/23/2016 11:11  Results for orders placed or performed in visit on 04/23/16 (from the  past 24 hour(s))  POCT CBC     Status: Abnormal   Collection Time: 04/23/16 10:54 AM  Result  Value Ref Range   WBC 3.1 (A) 4.6 - 10.2 K/uL   Lymph, poc 0.8 0.6 - 3.4   POC LYMPH PERCENT 26.2 10 - 50 %L   MID (cbc) 0.3 0 - 0.9   POC MID % 8.8 0 - 12 %M   POC Granulocyte 2.0 2 - 6.9   Granulocyte percent 65.0 37 - 80 %G   RBC 3.14 (A) 4.04 - 5.48 M/uL   Hemoglobin 10.2 (A) 12.2 - 16.2 g/dL   HCT, POC 29.8 (A) 37.7 - 47.9 %   MCV 94.9 80 - 97 fL   MCH, POC 32.5 (A) 27 - 31.2 pg   MCHC 34.2 31.8 - 35.4 g/dL   RDW, POC 13.2 %   Platelet Count, POC 179 142 - 424 K/uL   MPV 7.1 0 - 99.8 fL   Assessment and Plan :   1. Atypical chest pain 2. Shortness of breath 3. Cough 4. Post-nasal drainage - Will manage as a viral URI given physical exam findings, ecg, chest x-ray. I suspect her chest pain is due to her cough. Cough suppression therapy provided. Patient is to rtc in 2 days if no improvement. Referral to cardiology pending for further evaluation of atypical chest pain, cough in setting of CAD, HTN.  5. Essential hypertension - Labs pending, referral to cardiology to establish care for CAD, HTN and evaluation as above.  Jaynee Eagles, PA-C [facility] [phone] 04/23/2016 10:38 AM

## 2016-04-24 ENCOUNTER — Ambulatory Visit: Payer: BLUE CROSS/BLUE SHIELD | Admitting: Internal Medicine

## 2016-04-25 ENCOUNTER — Telehealth: Payer: Self-pay

## 2016-04-25 ENCOUNTER — Encounter: Payer: Self-pay | Admitting: Urgent Care

## 2016-04-25 NOTE — Telephone Encounter (Signed)
THIS MESSAGE IS FROM [facility] AT DR. TILLEY'S OFFICE (CARDIOLOGY). [facility] CALLED THE PATIENT TO SET UP AN APPOINTMENT TIME TO SEE DR. TILLEY. PATIENT SAID SHE HAS TO SEE HER PCP FIRST AND THAT SHE JUST CAME HERE BECAUSE IT WAS AN EMERGENCY. SHE SAID SHE WOULD CALL DR. TILLEY'S OFFICE BACK, BUT [facility] IS NOT SURE THAT SHE WILL. SHE JUST WANTED MARIO MANI TO BE AWARE. BEST PHONE IF QUESTIONS [phone]   [place]

## 2016-04-26 ENCOUNTER — Telehealth: Payer: Self-pay

## 2016-04-26 NOTE — Telephone Encounter (Signed)
Left message for pt to call back  °

## 2016-04-26 NOTE — Telephone Encounter (Signed)
Labs normal.

## 2016-04-26 NOTE — Telephone Encounter (Signed)
Pt is very anxious to get her lab results she was told that she would get a call back from the pA that saw her before now and that the lab would be giving her a call and none of this has happened -she states that she just wants someone to call her like they said they would

## 2016-05-07 ENCOUNTER — Encounter: Payer: Self-pay | Admitting: Internal Medicine

## 2016-05-07 ENCOUNTER — Ambulatory Visit (INDEPENDENT_AMBULATORY_CARE_PROVIDER_SITE_OTHER): Payer: BLUE CROSS/BLUE SHIELD | Admitting: Internal Medicine

## 2016-05-07 DIAGNOSIS — I2510 Atherosclerotic heart disease of native coronary artery without angina pectoris: Secondary | ICD-10-CM | POA: Diagnosis not present

## 2016-05-07 DIAGNOSIS — J069 Acute upper respiratory infection, unspecified: Secondary | ICD-10-CM | POA: Diagnosis not present

## 2016-05-07 DIAGNOSIS — R059 Cough, unspecified: Secondary | ICD-10-CM

## 2016-05-07 DIAGNOSIS — I5189 Other ill-defined heart diseases: Secondary | ICD-10-CM

## 2016-05-07 DIAGNOSIS — R05 Cough: Secondary | ICD-10-CM | POA: Diagnosis not present

## 2016-05-07 DIAGNOSIS — I519 Heart disease, unspecified: Secondary | ICD-10-CM | POA: Diagnosis not present

## 2016-05-07 MED ORDER — FLUTICASONE FUROATE-VILANTEROL 100-25 MCG/INH IN AEPB: 1.0000 | INHALATION_SPRAY | Freq: Every day | RESPIRATORY_TRACT | 5 refills | Status: DC

## 2016-05-07 NOTE — Assessment & Plan Note (Signed)
She has an appt w/Dr Wynonia Lawman this Friday. On ASA

## 2016-05-07 NOTE — Progress Notes (Signed)
Subjective:  Patient ID: Kelly Santos, female    DOB: 05/24/1946  Age: 70 y.o. MRN: [id number]  CC: No chief complaint on file.   HPI Kelly Santos presents for UC visit f/u (7/25) for URI - pt had CXR, EKG - ok. C/o cough. F/u HTN, allergies. She has an appt w/Dr Kelly Santos this Friday.  Outpatient Medications Prior to Visit  Medication Sig Dispense Refill  . amitriptyline (ELAVIL) 25 MG tablet Take 1 tablet (25 mg total) by mouth daily as needed. 30 tablet 3  . amLODipine-benazepril (LOTREL) 5-40 MG capsule TAKE 1 CAPSULE BY MOUTH DAILY. 90 capsule 3  . benzonatate (TESSALON) 100 MG capsule Take 1-2 capsules (100-200 mg total) by mouth 3 (three) times daily as needed for cough. 40 capsule 0  . brimonidine-timolol (COMBIGAN) 0.2-0.5 % ophthalmic solution Place 1 drop into both eyes 2 (two) times daily.    . cetirizine (ZYRTEC) 10 MG tablet Take 1 tablet (10 mg total) by mouth daily. 30 tablet 11  . cholecalciferol (VITAMIN D) 1000 UNITS tablet Take 1,000 Units by mouth daily.    . Ferrous Sulfate (IRON) 325 (65 FE) MG TABS Take 1 tablet by mouth daily.    Marland Kitchen HYDROcodone-homatropine (HYCODAN) 5-1.5 MG/5ML syrup Take 5 mLs by mouth at bedtime as needed. 120 mL 0  . HYDROCORTISONE ACE, RECTAL, (PROCTOCORT) 30 MG SUPP 1 pr bid 14 each 1  . ibuprofen (ADVIL,MOTRIN) 600 MG tablet TAKE 1 TABLET (600 MG TOTAL) BY MOUTH EVERY 6 (SIX) HOURS AS NEEDED (MILD PAIN). 60 tablet 0  . potassium chloride SA (KLOR-CON M20) 20 MEQ tablet Take 1 tablet (20 mEq total) by mouth 2 (two) times daily. 90 tablet 2  . triamcinolone cream (KENALOG) 0.5 % Apply topically 2 (two) times daily as needed. 30 g 2   No facility-administered medications prior to visit.     ROS Review of Systems  Constitutional: Negative for activity change, appetite change, chills, fatigue and unexpected weight change.  HENT: Negative for congestion, mouth sores and sinus pressure.   Eyes: Negative for visual disturbance.  Respiratory:  Positive for cough. Negative for chest tightness.   Gastrointestinal: Negative for abdominal pain and nausea.  Genitourinary: Negative for difficulty urinating, frequency and vaginal pain.  Musculoskeletal: Negative for back pain and gait problem.  Skin: Negative for pallor and rash.  Neurological: Negative for dizziness, tremors, weakness, numbness and headaches.  Psychiatric/Behavioral: Negative for confusion and sleep disturbance.    Objective:  BP 124/80   Pulse 83   Temp 99 F (37.2 C) (Oral)   Wt 169 lb (76.7 kg)   SpO2 98%   BMI 28.12 kg/m   BP Readings from Last 3 Encounters:  05/07/16 124/80  04/23/16 110/76  01/22/16 130/80    Wt Readings from Last 3 Encounters:  05/07/16 169 lb (76.7 kg)  04/23/16 170 lb (77.1 kg)  01/22/16 172 lb (78 kg)    Physical Exam  Constitutional: She appears well-developed. No distress.  HENT:  Head: Normocephalic.  Right Ear: External ear normal.  Left Ear: External ear normal.  Nose: Nose normal.  Mouth/Throat: Oropharynx is clear and moist.  Eyes: Conjunctivae are normal. Pupils are equal, round, and reactive to light. Right eye exhibits no discharge. Left eye exhibits no discharge.  Neck: Normal range of motion. Neck supple. No JVD present. No tracheal deviation present. No thyromegaly present.  Cardiovascular: Normal rate, regular rhythm and normal heart sounds.   Pulmonary/Chest: No stridor. No respiratory distress. She  has no wheezes.  Abdominal: Soft. Bowel sounds are normal. She exhibits no distension and no mass. There is no tenderness. There is no rebound and no guarding.  Musculoskeletal: She exhibits no edema or tenderness.  Lymphadenopathy:    She has no cervical adenopathy.  Neurological: She displays normal reflexes. No cranial nerve deficit. She exhibits normal muscle tone. Coordination normal.  Skin: No rash noted. No erythema.  Psychiatric: She has a normal mood and affect. Her behavior is normal. Judgment and  thought content normal.    I personally provided Breo inhaler use teaching. After the teaching patient was able to demonstrate it's use effectively. All questions were answered   Lab Results  Component Value Date   WBC 3.1 (A) 04/23/2016   HGB 10.2 (A) 04/23/2016   HCT 29.8 (A) 04/23/2016   PLT 222.0 11/02/2015   GLUCOSE 104 (H) 11/02/2015   CHOL 160 02/03/2015   TRIG 81.0 02/03/2015   HDL 51.30 02/03/2015   LDLCALC 93 02/03/2015   ALT 17 11/02/2015   AST 19 11/02/2015   NA 138 11/02/2015   K 3.4 (L) 11/02/2015   CL 106 11/02/2015   CREATININE 0.63 11/02/2015   BUN 9 11/02/2015   CO2 25 11/02/2015   TSH 0.39 11/02/2015   INR 0.96 05/09/2011   HGBA1C 5.9 03/24/2015    Dg Knee Complete 4 Views Left  Result Date: 12/30/2015 CLINICAL DATA:  Acute left knee pain.  No reported injury. EXAM: LEFT KNEE - COMPLETE 4+ VIEW COMPARISON:  None. FINDINGS: There is no evidence of fracture, dislocation, or joint effusion. There is no evidence of arthropathy or other focal bone abnormality. Soft tissues are unremarkable. IMPRESSION: Normal left knee. Electronically Signed   By: Marijo Conception, M.D.   On: 12/30/2015 10:44    Assessment & Plan:   There are no diagnoses linked to this encounter. I am having [name] maintain her cholecalciferol, brimonidine-timolol, Iron, amLODipine-benazepril, HYDROCORTISONE ACE (RECTAL), potassium chloride SA, triamcinolone cream, ibuprofen, amitriptyline, HYDROcodone-homatropine, benzonatate, and cetirizine.  No orders of the defined types were placed in this encounter.    Follow-up: No Follow-up on file.  Walker Kehr, MD

## 2016-05-07 NOTE — Assessment & Plan Note (Addendum)
Post-bronchitic cough Benazepril induced cough possibility was discussed - she does not wan to stop Benazepril She has an appt w/Dr Wynonia Lawman this Friday.  Breo qd x 2 wks

## 2016-05-07 NOTE — Progress Notes (Signed)
Pre visit review using our clinic review tool, if applicable. No additional management support is needed unless otherwise documented below in the visit note. 

## 2016-05-07 NOTE — Assessment & Plan Note (Signed)
She has an appt w/Dr Wynonia Lawman this Friday. Amlod-benazepril

## 2016-05-07 NOTE — Assessment & Plan Note (Signed)
Post-bronchitic cough Benazepril induced cough possibility was discussed - she does not wan to stop Benazepril

## 2016-05-16 ENCOUNTER — Other Ambulatory Visit: Payer: Self-pay | Admitting: Internal Medicine

## 2016-05-27 ENCOUNTER — Encounter: Payer: Self-pay | Admitting: Internal Medicine

## 2016-08-23 ENCOUNTER — Other Ambulatory Visit: Payer: Self-pay | Admitting: Internal Medicine

## 2016-11-13 ENCOUNTER — Other Ambulatory Visit: Payer: Self-pay | Admitting: Internal Medicine

## 2016-12-18 ENCOUNTER — Other Ambulatory Visit: Payer: Self-pay | Admitting: Internal Medicine

## 2017-01-14 ENCOUNTER — Encounter: Payer: Self-pay | Admitting: Internal Medicine

## 2017-01-14 ENCOUNTER — Ambulatory Visit (INDEPENDENT_AMBULATORY_CARE_PROVIDER_SITE_OTHER): Payer: BLUE CROSS/BLUE SHIELD | Admitting: Internal Medicine

## 2017-01-14 ENCOUNTER — Other Ambulatory Visit (INDEPENDENT_AMBULATORY_CARE_PROVIDER_SITE_OTHER): Payer: BLUE CROSS/BLUE SHIELD

## 2017-01-14 DIAGNOSIS — I10 Essential (primary) hypertension: Secondary | ICD-10-CM

## 2017-01-14 DIAGNOSIS — M79605 Pain in left leg: Secondary | ICD-10-CM

## 2017-01-14 DIAGNOSIS — R0789 Other chest pain: Secondary | ICD-10-CM | POA: Diagnosis not present

## 2017-01-14 DIAGNOSIS — I2510 Atherosclerotic heart disease of native coronary artery without angina pectoris: Secondary | ICD-10-CM

## 2017-01-14 DIAGNOSIS — E0580 Other thyrotoxicosis without thyrotoxic crisis or storm: Secondary | ICD-10-CM

## 2017-01-14 DIAGNOSIS — E559 Vitamin D deficiency, unspecified: Secondary | ICD-10-CM | POA: Diagnosis not present

## 2017-01-14 DIAGNOSIS — G8929 Other chronic pain: Secondary | ICD-10-CM

## 2017-01-14 DIAGNOSIS — M5442 Lumbago with sciatica, left side: Secondary | ICD-10-CM

## 2017-01-14 LAB — CBC WITH DIFFERENTIAL/PLATELET
BASOS ABS: 0 10*3/uL (ref 0.0–0.1)
Basophils Relative: 0.7 % (ref 0.0–3.0)
EOS ABS: 0.2 10*3/uL (ref 0.0–0.7)
Eosinophils Relative: 4.6 % (ref 0.0–5.0)
HEMATOCRIT: 38.4 % (ref 36.0–46.0)
HEMOGLOBIN: 12.7 g/dL (ref 12.0–15.0)
LYMPHS PCT: 21.8 % (ref 12.0–46.0)
Lymphs Abs: 0.8 10*3/uL (ref 0.7–4.0)
MCHC: 33.1 g/dL (ref 30.0–36.0)
MCV: 98.2 fl (ref 78.0–100.0)
Monocytes Absolute: 0.3 10*3/uL (ref 0.1–1.0)
Monocytes Relative: 8.9 % (ref 3.0–12.0)
Neutro Abs: 2.4 10*3/uL (ref 1.4–7.7)
Neutrophils Relative %: 64 % (ref 43.0–77.0)
Platelets: 224 10*3/uL (ref 150.0–400.0)
RBC: 3.92 Mil/uL (ref 3.87–5.11)
RDW: 13.5 % (ref 11.5–15.5)
WBC: 3.7 10*3/uL — AB (ref 4.0–10.5)

## 2017-01-14 LAB — HEPATIC FUNCTION PANEL
ALT: 16 U/L (ref 0–35)
AST: 17 U/L (ref 0–37)
Albumin: 4 g/dL (ref 3.5–5.2)
Alkaline Phosphatase: 73 U/L (ref 39–117)
BILIRUBIN TOTAL: 0.4 mg/dL (ref 0.2–1.2)
Bilirubin, Direct: 0 mg/dL (ref 0.0–0.3)
Total Protein: 7.7 g/dL (ref 6.0–8.3)

## 2017-01-14 LAB — CREATININE KINASE MB: CK-MB: 3 ng/mL (ref 0.3–4.0)

## 2017-01-14 LAB — BASIC METABOLIC PANEL
BUN: 12 mg/dL (ref 6–23)
CALCIUM: 9.3 mg/dL (ref 8.4–10.5)
CHLORIDE: 106 meq/L (ref 96–112)
CO2: 28 meq/L (ref 19–32)
CREATININE: 0.71 mg/dL (ref 0.40–1.20)
GFR: 104.48 mL/min (ref >60.00)
Glucose, Bld: 111 mg/dL — ABNORMAL HIGH (ref 70–99)
Potassium: 4 mEq/L (ref 3.5–5.1)
SODIUM: 139 meq/L (ref 135–145)

## 2017-01-14 LAB — D-DIMER, QUANTITATIVE: D-Dimer, Quant: 0.89 mcg/mL FEU — ABNORMAL HIGH (ref <0.50)

## 2017-01-14 LAB — TSH: TSH: 0.62 u[IU]/mL (ref 0.35–4.50)

## 2017-01-14 LAB — SEDIMENTATION RATE: Sed Rate: 15 mm/hr (ref 0–30)

## 2017-01-14 MED ORDER — TRIAMTERENE-HCTZ 37.5-25 MG PO TABS: 1.0000 | ORAL_TABLET | Freq: Every day | ORAL | 11 refills | Status: DC

## 2017-01-14 NOTE — Assessment & Plan Note (Addendum)
On Amlodipine and Benazepril Add Maxzide

## 2017-01-14 NOTE — Assessment & Plan Note (Signed)
On Vit D 

## 2017-01-14 NOTE — Progress Notes (Signed)
Pre visit review using our clinic review tool, if applicable. No additional management support is needed unless otherwise documented below in the visit note. 

## 2017-01-14 NOTE — Progress Notes (Signed)
Subjective:  Patient ID: Kelly Santos, female    DOB: Jul 10, 1946  Age: 71 y.o. MRN: [id number]  CC: No chief complaint on file.   HPI Kelly Santos presents for HTN f/u C/o L lat ankle swelling and LLE pain irrad up, pain behind L knee x 1 month and up to the L chest off and on She is s/p complete hysterectomy  Outpatient Medications Prior to Visit  Medication Sig Dispense Refill  . amitriptyline (ELAVIL) 25 MG tablet Take 1 tablet (25 mg total) by mouth daily as needed. 30 tablet 3  . amLODipine-benazepril (LOTREL) 5-40 MG capsule TAKE 1 CAPSULE BY MOUTH DAILY. 90 capsule 3  . benzonatate (TESSALON) 100 MG capsule Take 1-2 capsules (100-200 mg total) by mouth 3 (three) times daily as needed for cough. 40 capsule 0  . brimonidine-timolol (COMBIGAN) 0.2-0.5 % ophthalmic solution Place 1 drop into both eyes 2 (two) times daily.    . cetirizine (ZYRTEC) 10 MG tablet Take 1 tablet (10 mg total) by mouth daily. 30 tablet 11  . cholecalciferol (VITAMIN D) 1000 UNITS tablet Take 1,000 Units by mouth daily.    . Ferrous Sulfate (IRON) 325 (65 FE) MG TABS Take 1 tablet by mouth daily.    . fluticasone furoate-vilanterol (BREO ELLIPTA) 100-25 MCG/INH AEPB Inhale 1 puff into the lungs daily. 1 each 5  . HYDROcodone-homatropine (HYCODAN) 5-1.5 MG/5ML syrup Take 5 mLs by mouth at bedtime as needed. 120 mL 0  . HYDROCORTISONE ACE, RECTAL, (PROCTOCORT) 30 MG SUPP 1 pr bid 14 each 1  . ibuprofen (ADVIL,MOTRIN) 600 MG tablet TAKE 1 TABLET (600 MG TOTAL) BY MOUTH EVERY 6 (SIX) HOURS AS NEEDED (MILD PAIN). 60 tablet 0  . KLOR-CON M20 20 MEQ tablet TAKE 1 TABLET BY MOUTH TWICE A DAY 60 tablet 3  . triamcinolone cream (KENALOG) 0.5 % Apply topically 2 (two) times daily as needed. 30 g 2   No facility-administered medications prior to visit.     ROS Review of Systems  Constitutional: Negative for activity change, appetite change, chills, fatigue and unexpected weight change.  HENT: Negative for  congestion, mouth sores and sinus pressure.   Eyes: Negative for visual disturbance.  Respiratory: Negative for cough and chest tightness.   Cardiovascular: Positive for leg swelling.  Gastrointestinal: Negative for abdominal pain and nausea.  Genitourinary: Negative for difficulty urinating, frequency and vaginal pain.  Musculoskeletal: Positive for arthralgias. Negative for back pain and gait problem.  Skin: Negative for pallor and rash.  Neurological: Negative for dizziness, tremors, weakness, numbness and headaches.  Psychiatric/Behavioral: Negative for confusion and sleep disturbance.    Objective:  BP (!) 142/98 (BP Location: Left Arm, Patient Position: Sitting, Cuff Size: Large)   Pulse 65   Temp 99.1 F (37.3 C) (Oral)   Ht 5\' 5"  (1.651 m)   Wt 172 lb (78 kg)   SpO2 100%   BMI 28.62 kg/m   BP Readings from Last 3 Encounters:  01/14/17 (!) 142/98  05/07/16 124/80  04/23/16 110/76    Wt Readings from Last 3 Encounters:  01/14/17 172 lb (78 kg)  05/07/16 169 lb (76.7 kg)  04/23/16 170 lb (77.1 kg)    Physical Exam  Constitutional: She appears well-developed. No distress.  HENT:  Head: Normocephalic.  Right Ear: External ear normal.  Left Ear: External ear normal.  Nose: Nose normal.  Mouth/Throat: Oropharynx is clear and moist.  Eyes: Conjunctivae are normal. Pupils are equal, round, and reactive to light. Right  eye exhibits no discharge. Left eye exhibits no discharge.  Neck: Normal range of motion. Neck supple. No JVD present. No tracheal deviation present. No thyromegaly present.  Cardiovascular: Normal rate, regular rhythm and normal heart sounds.   Pulmonary/Chest: No stridor. No respiratory distress. She has no wheezes.  Abdominal: Soft. Bowel sounds are normal. She exhibits no distension and no mass. There is no tenderness. There is no rebound and no guarding.  Musculoskeletal: She exhibits edema. She exhibits no tenderness.  Lymphadenopathy:    She has  no cervical adenopathy.  Neurological: She displays normal reflexes. No cranial nerve deficit. She exhibits normal muscle tone. Coordination normal.  Skin: No rash noted. No erythema.  Psychiatric: She has a normal mood and affect. Her behavior is normal. Judgment and thought content normal.  str leg elev (-) B Calves NT Chest wall NT  Procedure: EKG Indication: chest pain Impression: NSR. No acute or new changes.   Lab Results  Component Value Date   WBC 3.1 (A) 04/23/2016   HGB 10.2 (A) 04/23/2016   HCT 29.8 (A) 04/23/2016   PLT 222.0 11/02/2015   GLUCOSE 104 (H) 11/02/2015   CHOL 160 02/03/2015   TRIG 81.0 02/03/2015   HDL 51.30 02/03/2015   LDLCALC 93 02/03/2015   ALT 17 11/02/2015   AST 19 11/02/2015   NA 138 11/02/2015   K 3.4 (L) 11/02/2015   CL 106 11/02/2015   CREATININE 0.63 11/02/2015   BUN 9 11/02/2015   CO2 25 11/02/2015   TSH 0.39 11/02/2015   INR 0.96 05/09/2011   HGBA1C 5.9 03/24/2015    Dg Knee Complete 4 Views Left  Result Date: 12/30/2015 CLINICAL DATA:  Acute left knee pain.  No reported injury. EXAM: LEFT KNEE - COMPLETE 4+ VIEW COMPARISON:  None. FINDINGS: There is no evidence of fracture, dislocation, or joint effusion. There is no evidence of arthropathy or other focal bone abnormality. Soft tissues are unremarkable. IMPRESSION: Normal left knee. Electronically Signed   By: Marijo Conception, M.D.   On: 12/30/2015 10:44    Assessment & Plan:   There are no diagnoses linked to this encounter. I am having Ms. Shiflet maintain her cholecalciferol, brimonidine-timolol, Iron, HYDROCORTISONE ACE (RECTAL), triamcinolone cream, ibuprofen, amitriptyline, HYDROcodone-homatropine, benzonatate, cetirizine, fluticasone furoate-vilanterol, amLODipine-benazepril, and KLOR-CON M20.  No orders of the defined types were placed in this encounter.    Follow-up: No Follow-up on file.  Walker Kehr, MD

## 2017-01-14 NOTE — Assessment & Plan Note (Signed)
Labs

## 2017-01-14 NOTE — Assessment & Plan Note (Signed)
Chronic ? Sciatica L

## 2017-01-14 NOTE — Assessment & Plan Note (Addendum)
?  etiol D dimer Sports med ref

## 2017-01-14 NOTE — Assessment & Plan Note (Signed)
D dimer EKG

## 2017-01-14 NOTE — Assessment & Plan Note (Signed)
On ASA 

## 2017-01-20 ENCOUNTER — Ambulatory Visit (INDEPENDENT_AMBULATORY_CARE_PROVIDER_SITE_OTHER)
Admission: RE | Admit: 2017-01-20 | Discharge: 2017-01-20 | Disposition: A | Discharge status: Home / Self Care | Payer: BLUE CROSS/BLUE SHIELD | Source: Ambulatory Visit | Attending: Internal Medicine | Admitting: Internal Medicine

## 2017-01-20 DIAGNOSIS — R0789 Other chest pain: Secondary | ICD-10-CM

## 2017-01-20 DIAGNOSIS — M79605 Pain in left leg: Secondary | ICD-10-CM

## 2017-01-20 MED ORDER — IOPAMIDOL (ISOVUE-370) INJECTION 76%
80.0000 mL | Freq: Once | INTRAVENOUS | Status: AC | PRN
  Administered 2017-01-20: 80 mL via INTRAVENOUS

## 2017-01-22 ENCOUNTER — Encounter: Payer: Self-pay | Admitting: *Deleted

## 2017-01-22 NOTE — Progress Notes (Signed)
Cardiology Office Note  NEW PATIENT VISIT   Date:  01/23/2017   ID:  Kelly Santos, DOB 1945/10/13, MRN [id number]  PCP:  Kelly Kehr, MD  Cardiologist:  NEW  But Dr. Marlou Santos was her cardiologist  Chief Complaint  Patient presents with  . Chest Pain    bief episodic.        History of Present Illness: Kelly Santos is a 71 y.o. female who presents for chest pain, referred by Dr. Alain Santos.   Hx cath in 2002 with trivial CAD and normal EF.  But several risk factors for CAD.  Echo in 2017 -August. In 2015 nuc stress test, without ischemia.  Now with episodic chest pain last seconds to minutes but daily.  Occurs with rest and exertion.  No associated symptoms.  She also has Lt > rt leg size and pain.  She had venous doppler last year without DVT and now her ddimer was elevated, CTA of chest was normal.  Lt ant leg is mildly tender.     Her BP continues to to be elevated, will increase amlodipine.  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  She walks some for exercise and tries to eat healthy.    Also episodic Rt back pain episodically.  I reviewed all her recent tests with her.    Past Medical History:  Diagnosis Date  . Anemia   . CAD (coronary artery disease)    no cardiologist, followed by PCP only  . Cataract   . Diverticulosis   . Generalized headaches   . GERD (gastroesophageal reflux disease)   . Glaucoma   . Hemorrhoids   . Hiatal hernia    On CT done Jan 31, 2011   . Hx of colonic polyps   . Hypertension   . Hyperthyroidism   . Osteoarthritis   . Uterine polyp   . Vitamin D deficiency     Past Surgical History:  Procedure Laterality Date  . ABDOMINAL HYSTERECTOMY N/A 04/26/2013   Procedure: HYSTERECTOMY ABDOMINAL;  Surgeon: Kelly Mourning, MD;  Location: [facility];  Service: Gynecology;  Laterality: N/A;  . DILATION AND CURETTAGE OF UTERUS  01/18/2013   with uterine polypectomy  . SALPINGOOPHORECTOMY  Bilateral 04/26/2013   Procedure: SALPINGO OOPHORECTOMY;  Surgeon: Kelly Mourning, MD;  Location: [facility];  Service: Gynecology;  Laterality: Bilateral;     Current Outpatient Prescriptions  Medication Sig Dispense Refill  . amitriptyline (ELAVIL) 25 MG tablet Take 1 tablet (25 mg total) by mouth daily as needed. 30 tablet 3  . benzonatate (TESSALON) 100 MG capsule Take 1-2 capsules (100-200 mg total) by mouth 3 (three) times daily as needed for cough. 40 capsule 0  . brimonidine-timolol (COMBIGAN) 0.2-0.5 % ophthalmic solution Place 1 drop into both eyes 2 (two) times daily.    . cetirizine (ZYRTEC) 10 MG tablet Take 1 tablet (10 mg total) by mouth daily. 30 tablet 11  . cholecalciferol (VITAMIN D) 1000 UNITS tablet Take 1,000 Units by mouth daily.    . Ferrous Sulfate (IRON) 325 (65 FE) MG TABS Take 1 tablet by mouth daily.    . fluticasone furoate-vilanterol (BREO ELLIPTA) 100-25 MCG/INH AEPB Inhale 1 puff into the lungs daily. 1 each 5  . hydrocortisone (PROCTOCORT) 1 % CREA Apply 1 application topically 2 (two) times daily as needed.    Marland Kitchen ibuprofen (ADVIL,MOTRIN) 600 MG tablet TAKE 1 TABLET (600 MG TOTAL)  BY MOUTH EVERY 6 (SIX) HOURS AS NEEDED (MILD PAIN). 60 tablet 0  . KLOR-CON M20 20 MEQ tablet TAKE 1 TABLET BY MOUTH TWICE A DAY 60 tablet 3  . triamcinolone cream (KENALOG) 0.5 % Apply topically 2 (two) times daily as needed. 30 g 2  . triamterene-hydrochlorothiazide (MAXZIDE-25) 37.5-25 MG tablet Take 1 tablet by mouth daily. 30 tablet 11  . amLODipine (NORVASC) 5 MG tablet Take 1 tablet (5 mg total) by mouth daily. 25 tablet 0  . amLODipine-benazepril (LOTREL) 10-40 MG capsule Take 1 capsule by mouth daily. 30 capsule 9   No current facility-administered medications for this visit.     Allergies:   Patient has no known allergies.    Social History:  The patient  reports that she has never smoked. She has never used smokeless tobacco. She reports that she does not drink alcohol  or use drugs.   Family History:  The patient's family history includes CVA in her brother; Deep vein thrombosis (age of onset: 41) in her son; Diabetes in her brother; Heart failure in her father; Hypertension in her brother, father, mother, and sister.    ROS:  General:no colds or fevers, no weight changes Skin:no rashes or ulcers HEENT:no blurred vision, no congestion CV:see HPI PUL:see HPI GI:no diarrhea constipation or melena, no indigestion GU:no hematuria, no dysuria MS:no joint pain, no claudication Neuro:no syncope, no lightheadedness Endo:no diabetes, no thyroid disease followed by Dr. Alain Santos.   Wt Readings from Last 3 Encounters:  01/23/17 172 lb 6.4 oz (78.2 kg)  01/14/17 172 lb (78 kg)  05/07/16 169 lb (76.7 kg)     PHYSICAL EXAM: VS:  BP (!) 142/100   Pulse (!) 57   Ht 5\' 5"  (1.651 m)   Wt 172 lb 6.4 oz (78.2 kg)   BMI 28.69 kg/m  , BMI Body mass index is 28.69 kg/m. General:Pleasant affect, NAD Skin:Warm and dry, brisk capillary refill HEENT:normocephalic, sclera clear, mucus membranes moist Neck:supple, no JVD, no bruits  Heart:S1S2 RRR without murmur, gallup, rub or click Lungs:clear without rales, rhonchi, or wheezes UDJ:SHFW, non tender, + BS, do not palpate liver spleen or masses Ext:Lt leg > Rt leg no lower ext edema, 2+ pedal pulses, 2+ radial pulses, crepitus in rt knee with movement and patella movement with bending Lt knee  Neuro:alert and oriented X 3, MAE, follows commands, + facial symmetry    EKG:  EKG is ordered today. The ekg ordered today demonstrates SB at 36 with no acute changes.     Recent Labs: 04/23/2016: Brain Natriuretic Peptide 46.1 01/14/2017: ALT 16; BUN 12; Creatinine, Ser 0.71; Hemoglobin 12.7; Platelets 224.0; Potassium 4.0; Sodium 139; TSH 0.62    Lipid Panel    Component Value Date/Time   CHOL 160 02/03/2015 1018   TRIG 81.0 02/03/2015 1018   HDL 51.30 02/03/2015 1018   CHOLHDL 3 02/03/2015 1018   VLDL 16.2  02/03/2015 1018   LDLCALC 93 02/03/2015 1018       Other studies Reviewed: Additional studies/ records that were reviewed today include: previous OV notes with PCP.Marland Kitchen Cath 2002   DIAGNOSES: 1. Trivial coronary artery disease by angiogram. 2. Normal left ventricular systolic function.  nuc study 12/2013 was neg for ischemia  ASSESSMENT AND PLAN:  1.  Chest pain some typical and some atypical and minimal CAD in 2002.  Will plan for exercise myoview and follow up with Dr. Marlou Santos. She has had episodes of chest pain off and on for a year  as well.    2.  HTN continues, discussed with Dr. Acie Fredrickson and he would like to check aldosterone and renin and will change maxzide to spironolactone and stop K+.  Pt just bought the maxide and would like to continue until labs are back. Will increase amlodipine to 10 mg.   3.  Burning of feet - with mildly elevated glucose.    Will check HgbA1c  4.  Recent elevated ddimer with neg CTA of chest.  She is tender on Lt ant calf, last year neg venous doppler but she believes more tender.  Will repeat venous doppler.  We discussed other reasons for pain, she does have crept [place] in knees with movement.    5. Last lipids were normal. But will recheck.    Current medicines are reviewed with the patient today.  The patient Has no concerns regarding medicines.  The following changes have been made:  See above Labs/ tests ordered today include:see above  Disposition:   FU:  see above  Signed, Cecilie Kicks, NP  01/23/2017 12:07 PM    [facility], [place], [place], [place] Phone: [phone]; Fax: [phone]

## 2017-01-23 ENCOUNTER — Encounter: Payer: Self-pay | Admitting: Cardiology

## 2017-01-23 ENCOUNTER — Ambulatory Visit (INDEPENDENT_AMBULATORY_CARE_PROVIDER_SITE_OTHER): Payer: BLUE CROSS/BLUE SHIELD | Admitting: Cardiology

## 2017-01-23 VITALS — BP 142/100 | HR 57 | Ht 65.0 in | Wt 172.4 lb

## 2017-01-23 DIAGNOSIS — I82402 Acute embolism and thrombosis of unspecified deep veins of left lower extremity: Secondary | ICD-10-CM

## 2017-01-23 DIAGNOSIS — I10 Essential (primary) hypertension: Secondary | ICD-10-CM | POA: Diagnosis not present

## 2017-01-23 DIAGNOSIS — I2510 Atherosclerotic heart disease of native coronary artery without angina pectoris: Secondary | ICD-10-CM | POA: Diagnosis not present

## 2017-01-23 DIAGNOSIS — R739 Hyperglycemia, unspecified: Secondary | ICD-10-CM | POA: Diagnosis not present

## 2017-01-23 DIAGNOSIS — M7122 Synovial cyst of popliteal space [Baker], left knee: Secondary | ICD-10-CM

## 2017-01-23 DIAGNOSIS — R079 Chest pain, unspecified: Secondary | ICD-10-CM

## 2017-01-23 DIAGNOSIS — M79605 Pain in left leg: Secondary | ICD-10-CM

## 2017-01-23 DIAGNOSIS — R7989 Other specified abnormal findings of blood chemistry: Secondary | ICD-10-CM

## 2017-01-23 MED ORDER — AMLODIPINE BESY-BENAZEPRIL HCL 10-40 MG PO CAPS: 1.0000 | ORAL_CAPSULE | Freq: Every day | ORAL | 9 refills | Status: DC

## 2017-01-23 MED ORDER — AMLODIPINE BESYLATE 5 MG PO TABS: 5.0000 mg | ORAL_TABLET | Freq: Every day | ORAL | 0 refills | Status: DC

## 2017-01-23 NOTE — Patient Instructions (Signed)
Medication Instructions:  Your physician has recommended you make the following change in your medication:  1. Increase the Lotrel (10-40 mg) daily   Labwork: Your physician recommends that you have lab work today> BMET/HGB/A1C/LIPID/ALD/RENIN   Testing/Procedures: Your physician has requested that you have en exercise stress myoview. For further information please visit [email or web address]. Please follow instruction sheet, as given.  Your physician has requested that you have a lower or upper extremity venous duplex. This test is an ultrasound of the veins in the legs or arms. It looks at venous blood flow that carries blood from the heart to the legs or arms. Allow one hour for a Lower Venous exam. Allow thirty minutes for an Upper Venous exam. There are no restrictions or special instructions.      Follow-Up: Your physician recommends that you keep your scheduled follow-up appointment with Dr. Marlou Porch.   Any Other Special Instructions Will Be Listed Below (If Applicable).     If you need a refill on your cardiac medications before your next appointment, please call your pharmacy.

## 2017-01-24 LAB — HEMOGLOBIN A1C
ESTIMATED AVERAGE GLUCOSE: 120 mg/dL
HEMOGLOBIN A1C: 5.8 % — AB (ref 4.8–5.6)

## 2017-01-27 LAB — BASIC METABOLIC PANEL
BUN/Creatinine Ratio: 15 (ref 12–28)
BUN: 10 mg/dL (ref 8–27)
CALCIUM: 9.2 mg/dL (ref 8.7–10.3)
CO2: 25 mmol/L (ref 18–29)
CREATININE: 0.66 mg/dL (ref 0.57–1.00)
Chloride: 102 mmol/L (ref 96–106)
GFR calc Af Amer: 104 mL/min/{1.73_m2} (ref >59)
GFR calc non Af Amer: 90 mL/min/{1.73_m2} (ref >59)
GLUCOSE: 97 mg/dL (ref 65–99)
Potassium: 4.1 mmol/L (ref 3.5–5.2)
Sodium: 140 mmol/L (ref 134–144)

## 2017-01-27 LAB — LIPID PANEL
Chol/HDL Ratio: 2.8 ratio (ref 0.0–4.4)
Cholesterol, Total: 156 mg/dL (ref 100–199)
HDL: 55 mg/dL
LDL Calculated: 84 mg/dL (ref 0–99)
Triglycerides: 83 mg/dL (ref 0–149)
VLDL Cholesterol Cal: 17 mg/dL (ref 5–40)

## 2017-01-27 LAB — ALDOSTERONE + RENIN ACTIVITY W/ RATIO
ALDOS/RENIN RATIO: >122.2 — ABNORMAL HIGH (ref 0.0–30.0)
ALDOSTERONE: 20.4 ng/dL (ref 0.0–30.0)
Renin: <0.167 ng/mL/h — ABNORMAL LOW (ref 0.167–5.380)

## 2017-01-28 ENCOUNTER — Other Ambulatory Visit: Payer: Self-pay | Admitting: Cardiology

## 2017-01-28 DIAGNOSIS — M7122 Synovial cyst of popliteal space [Baker], left knee: Secondary | ICD-10-CM

## 2017-01-28 DIAGNOSIS — I82402 Acute embolism and thrombosis of unspecified deep veins of left lower extremity: Secondary | ICD-10-CM

## 2017-02-04 ENCOUNTER — Telehealth (HOSPITAL_COMMUNITY): Payer: Self-pay | Admitting: *Deleted

## 2017-02-04 NOTE — Telephone Encounter (Signed)
Left message on voicemail in reference to upcoming appointment scheduled for 02/10/17. Phone number given for a call back so details instructions can be given. Jaquille Kau, Ranae [place]ms

## 2017-02-06 ENCOUNTER — Encounter: Payer: Self-pay | Admitting: Cardiology

## 2017-02-06 ENCOUNTER — Telehealth: Payer: Self-pay | Admitting: *Deleted

## 2017-02-06 ENCOUNTER — Ambulatory Visit: Payer: BLUE CROSS/BLUE SHIELD | Admitting: Family Medicine

## 2017-02-06 ENCOUNTER — Telehealth: Payer: Self-pay | Admitting: Internal Medicine

## 2017-02-06 ENCOUNTER — Ambulatory Visit (HOSPITAL_COMMUNITY)
Admission: RE | Admit: 2017-02-06 | Discharge: 2017-02-06 | Disposition: A | Discharge status: Home / Self Care | Payer: BLUE CROSS/BLUE SHIELD | Source: Ambulatory Visit | Attending: Cardiovascular Disease | Admitting: Cardiovascular Disease

## 2017-02-06 ENCOUNTER — Telehealth (HOSPITAL_COMMUNITY): Payer: Self-pay | Admitting: *Deleted

## 2017-02-06 DIAGNOSIS — I2510 Atherosclerotic heart disease of native coronary artery without angina pectoris: Secondary | ICD-10-CM | POA: Diagnosis not present

## 2017-02-06 DIAGNOSIS — M7122 Synovial cyst of popliteal space [Baker], left knee: Secondary | ICD-10-CM

## 2017-02-06 DIAGNOSIS — I10 Essential (primary) hypertension: Secondary | ICD-10-CM | POA: Insufficient documentation

## 2017-02-06 DIAGNOSIS — M7989 Other specified soft tissue disorders: Secondary | ICD-10-CM | POA: Diagnosis not present

## 2017-02-06 DIAGNOSIS — M79662 Pain in left lower leg: Secondary | ICD-10-CM | POA: Diagnosis present

## 2017-02-06 DIAGNOSIS — M79605 Pain in left leg: Secondary | ICD-10-CM

## 2017-02-06 DIAGNOSIS — I82402 Acute embolism and thrombosis of unspecified deep veins of left lower extremity: Secondary | ICD-10-CM

## 2017-02-06 DIAGNOSIS — Z79899 Other long term (current) drug therapy: Secondary | ICD-10-CM

## 2017-02-06 MED ORDER — SPIRONOLACTONE 25 MG PO TABS: 25.0000 mg | ORAL_TABLET | Freq: Every day | ORAL | 1 refills | Status: DC

## 2017-02-06 NOTE — Telephone Encounter (Signed)
Unable to reach up, will try later

## 2017-02-06 NOTE — Telephone Encounter (Signed)
Pt would like a call back regarding her appt with another doctor, she was told her A1C was high, she is concerned and would like a call back.

## 2017-02-06 NOTE — Telephone Encounter (Signed)
Patient given detailed instructions per Myocardial Perfusion Study Information Sheet for the test on 02/10/17 at 0745. Patient notified to arrive 15 minutes early and that it is imperative to arrive on time for appointment to keep from having the test rescheduled.  If you need to cancel or reschedule your appointment, please call the office within 24 hours of your appointment. Failure to do so may result in a cancellation of your appointment, and a $50 no show fee. Patient verbalized understanding.Nateisha Moyd, Ranae [place]ms

## 2017-02-06 NOTE — Telephone Encounter (Signed)
-----   Message from Isaiah Serge, NP sent at 02/06/2017  8:38 AM EDT ----- Pt needs to stop Maxzide and K+ and begin spironolactone 25 mg daily.   Then recheck BMP in 1 week. We had discussed this on visit but she had just bought Maxzide she can begin this in next one or two weeks-  Will notify her PCP to follow thanks.

## 2017-02-10 ENCOUNTER — Ambulatory Visit (HOSPITAL_COMMUNITY): Payer: BLUE CROSS/BLUE SHIELD | Attending: Cardiology

## 2017-02-10 DIAGNOSIS — R739 Hyperglycemia, unspecified: Secondary | ICD-10-CM | POA: Diagnosis not present

## 2017-02-10 DIAGNOSIS — I82402 Acute embolism and thrombosis of unspecified deep veins of left lower extremity: Secondary | ICD-10-CM | POA: Insufficient documentation

## 2017-02-10 DIAGNOSIS — M7122 Synovial cyst of popliteal space [Baker], left knee: Secondary | ICD-10-CM | POA: Insufficient documentation

## 2017-02-10 DIAGNOSIS — R079 Chest pain, unspecified: Secondary | ICD-10-CM | POA: Diagnosis not present

## 2017-02-10 DIAGNOSIS — I10 Essential (primary) hypertension: Secondary | ICD-10-CM | POA: Diagnosis not present

## 2017-02-10 LAB — MYOCARDIAL PERFUSION IMAGING
CHL CUP NUCLEAR SDS: 2
CHL CUP NUCLEAR SSS: 2
CHL CUP RESTING HR STRESS: 63 {beats}/min
CSEPED: 8 min
Estimated workload: 10.1 METS
LV dias vol: 94 mL (ref 46–106)
LV sys vol: 34 mL
MPHR: 150 {beats}/min
Peak HR: 139 {beats}/min
Percent HR: 92 %
RATE: 0.25
RPE: 15
SRS: 0
TID: 0.93

## 2017-02-10 MED ORDER — TECHNETIUM TC 99M TETROFOSMIN IV KIT
10.5000 | PACK | Freq: Once | INTRAVENOUS | Status: AC | PRN
  Administered 2017-02-10: 10.5 via INTRAVENOUS
  Filled 2017-02-10: qty 11

## 2017-02-10 MED ORDER — TECHNETIUM TC 99M TETROFOSMIN IV KIT
32.6000 | PACK | Freq: Once | INTRAVENOUS | Status: AC | PRN
  Administered 2017-02-10: 32.6 via INTRAVENOUS
  Filled 2017-02-10: qty 33

## 2017-02-11 NOTE — Telephone Encounter (Signed)
Pt was concerned about her A1c being a 5.8. Provider who had the labs drawn did not recommend any treatment. Pt was advised if she did was to do anything to watch her sugar intake and this will help with her levels.

## 2017-02-14 ENCOUNTER — Other Ambulatory Visit: Payer: BLUE CROSS/BLUE SHIELD | Admitting: *Deleted

## 2017-02-14 DIAGNOSIS — Z79899 Other long term (current) drug therapy: Secondary | ICD-10-CM

## 2017-02-14 LAB — BASIC METABOLIC PANEL
BUN / CREAT RATIO: 19 (ref 12–28)
BUN: 13 mg/dL (ref 8–27)
CO2: 22 mmol/L (ref 18–29)
CREATININE: 0.68 mg/dL (ref 0.57–1.00)
Calcium: 9.6 mg/dL (ref 8.7–10.3)
Chloride: 103 mmol/L (ref 96–106)
GFR, EST AFRICAN AMERICAN: 102 mL/min/{1.73_m2} (ref >59)
GFR, EST NON AFRICAN AMERICAN: 89 mL/min/{1.73_m2} (ref >59)
Glucose: 90 mg/dL (ref 65–99)
POTASSIUM: 3.8 mmol/L (ref 3.5–5.2)
SODIUM: 140 mmol/L (ref 134–144)

## 2017-02-20 ENCOUNTER — Ambulatory Visit: Payer: BLUE CROSS/BLUE SHIELD | Admitting: Internal Medicine

## 2017-02-28 ENCOUNTER — Ambulatory Visit (INDEPENDENT_AMBULATORY_CARE_PROVIDER_SITE_OTHER): Payer: BLUE CROSS/BLUE SHIELD | Admitting: Internal Medicine

## 2017-02-28 ENCOUNTER — Encounter: Payer: Self-pay | Admitting: Internal Medicine

## 2017-02-28 ENCOUNTER — Ambulatory Visit (INDEPENDENT_AMBULATORY_CARE_PROVIDER_SITE_OTHER): Payer: BLUE CROSS/BLUE SHIELD | Admitting: Cardiology

## 2017-02-28 VITALS — BP 138/88 | HR 78 | Ht 64.0 in | Wt 170.8 lb

## 2017-02-28 DIAGNOSIS — R600 Localized edema: Secondary | ICD-10-CM | POA: Diagnosis not present

## 2017-02-28 DIAGNOSIS — R609 Edema, unspecified: Secondary | ICD-10-CM

## 2017-02-28 DIAGNOSIS — I10 Essential (primary) hypertension: Secondary | ICD-10-CM

## 2017-02-28 DIAGNOSIS — I2510 Atherosclerotic heart disease of native coronary artery without angina pectoris: Secondary | ICD-10-CM

## 2017-02-28 DIAGNOSIS — R0789 Other chest pain: Secondary | ICD-10-CM | POA: Diagnosis not present

## 2017-02-28 DIAGNOSIS — I5189 Other ill-defined heart diseases: Secondary | ICD-10-CM

## 2017-02-28 DIAGNOSIS — I519 Heart disease, unspecified: Secondary | ICD-10-CM | POA: Diagnosis not present

## 2017-02-28 MED ORDER — IBUPROFEN 600 MG PO TABS
ORAL_TABLET | ORAL | 3 refills | Status: DC
Start: 2017-02-28 — End: 2018-04-16

## 2017-02-28 MED ORDER — LOSARTAN POTASSIUM 100 MG PO TABS: 100.0000 mg | ORAL_TABLET | Freq: Every day | ORAL | 3 refills | Status: DC

## 2017-02-28 NOTE — Assessment & Plan Note (Signed)
Take losartan in place of Amlodipine-Benazepril

## 2017-02-28 NOTE — Patient Instructions (Addendum)
Take losartan in place of Amlodipine-Benazepril   Use elastic ankle brace

## 2017-02-28 NOTE — Patient Instructions (Signed)
Medication Instructions:  The current medical regimen is effective;  continue present plan and medications.  Follow-Up: Follow up as needed with Dr Skains.  Thank you for choosing [facility]!!     

## 2017-02-28 NOTE — Progress Notes (Signed)
Cardiology Office Note:    Date:  02/28/2017   ID:  Kelly Santos, DOB 1945-12-28, MRN [id number]  PCP:  Cassandria Anger, MD  Cardiologist:  Candee Furbish, MD   Referring MD: Cassandria Anger, MD     History of Present Illness:    Kelly Santos is a 71 y.o. female her follow-up cardiac studies. Was recently evaluated for chest pain on 01/23/17 by Cecilie Kicks, NP.  Prior history of catheterization in 2012 with trivial CAD and normal EF. Echocardiogram in 2017 and nuclear stress test in 2015 was normal.  She is a CMA.  Vascular ultrasound was reassuring with no DVT on 02/06/17.   Nuclear stress EF: 64%. Normal wall motion  Downsloping ST segment depression ST segment depression of 2 mm was noted during stress in the II, III, aVF and V5 leads.  This is a low risk perfusion study. Normal perfusion pattern would suggest false positive exercise treadmill test results. There was no transient ischemic dilatation.  Similar findings were noted on prior nuclear stress test 2015.  Feels better. May have had gas previously.   Overall doing quite well. She is off her potassium because she is taking the spironolactone.  Past Medical History:  Diagnosis Date  . Anemia   . CAD (coronary artery disease)    no cardiologist, followed by PCP only  . Cataract   . Diverticulosis   . Generalized headaches   . GERD (gastroesophageal reflux disease)   . Glaucoma   . Hemorrhoids   . Hiatal hernia    On CT done Jan 31, 2011   . Hx of colonic polyps   . Hypertension   . Hyperthyroidism   . Osteoarthritis   . Uterine polyp   . Vitamin D deficiency     Past Surgical History:  Procedure Laterality Date  . ABDOMINAL HYSTERECTOMY N/A 04/26/2013   Procedure: HYSTERECTOMY ABDOMINAL;  Surgeon: Cyril Mourning, MD;  Location: [facility];  Service: Gynecology;  Laterality: N/A;  . DILATION AND CURETTAGE OF UTERUS  01/18/2013   with uterine polypectomy  . SALPINGOOPHORECTOMY Bilateral 04/26/2013     Procedure: SALPINGO OOPHORECTOMY;  Surgeon: Cyril Mourning, MD;  Location: [facility];  Service: Gynecology;  Laterality: Bilateral;    Current Medications: Current Meds  Medication Sig  . amitriptyline (ELAVIL) 25 MG tablet Take 1 tablet (25 mg total) by mouth daily as needed.  Marland Kitchen amLODipine-benazepril (LOTREL) 10-40 MG capsule Take 1 capsule by mouth daily.  . benzonatate (TESSALON) 100 MG capsule Take 1-2 capsules (100-200 mg total) by mouth 3 (three) times daily as needed for cough.  . brimonidine-timolol (COMBIGAN) 0.2-0.5 % ophthalmic solution Place 1 drop into both eyes 2 (two) times daily.  . cetirizine (ZYRTEC) 10 MG tablet Take 1 tablet (10 mg total) by mouth daily.  . cholecalciferol (VITAMIN D) 1000 UNITS tablet Take 1,000 Units by mouth daily.  . Ferrous Sulfate (IRON) 325 (65 FE) MG TABS Take 1 tablet by mouth daily.  . fluticasone furoate-vilanterol (BREO ELLIPTA) 100-25 MCG/INH AEPB Inhale 1 puff into the lungs daily.  . hydrocortisone (PROCTOCORT) 1 % CREA Apply 1 application topically 2 (two) times daily as needed.  Marland Kitchen ibuprofen (ADVIL,MOTRIN) 600 MG tablet TAKE 1 TABLET (600 MG TOTAL) BY MOUTH EVERY 6 (SIX) HOURS AS NEEDED (MILD PAIN).  Marland Kitchen spironolactone (ALDACTONE) 25 MG tablet Take 1 tablet (25 mg total) by mouth daily.  Marland Kitchen triamcinolone cream (KENALOG) 0.5 % Apply topically 2 (two) times daily as needed.  Allergies:   Patient has no known allergies.   Social History   Social History  . Marital status: Legally Separated    Spouse name: n/a  . Number of children: 3  . Years of education: N/A   Occupational History  . CNA [employer]   Social History Main Topics  . Smoking status: Never Smoker  . Smokeless tobacco: Never Used  . Alcohol use No  . Drug use: No  . Sexual activity: Yes   Other Topics Concern  . Not on file   Social History Narrative   Lives with her daughter and granddaughter.     Family History: The  patient's family history includes CVA in her brother; Deep vein thrombosis (age of onset: 63) in her son; Diabetes in her brother; Heart failure in her father; Hypertension in her brother, father, mother, and sister. There is no history of Colon cancer. ROS:   Please see the history of present illness.     All other systems reviewed and are negative.  EKGs/Labs/Other Studies Reviewed:    The following studies were reviewed today: As above.  EKG:  EKG is not ordered today.    Recent Labs: 04/23/2016: Brain Natriuretic Peptide 46.1 01/14/2017: ALT 16; Hemoglobin 12.7; Platelets 224.0; TSH 0.62 02/14/2017: BUN 13; Creatinine, Ser 0.68; Potassium 3.8; Sodium 140   Recent Lipid Panel    Component Value Date/Time   CHOL 156 01/23/2017 1201   TRIG 83 01/23/2017 1201   HDL 55 01/23/2017 1201   CHOLHDL 2.8 01/23/2017 1201   CHOLHDL 3 02/03/2015 1018   VLDL 16.2 02/03/2015 1018   LDLCALC 84 01/23/2017 1201    Physical Exam:    VS:  BP 138/88   Pulse 78   Ht 5\' 4"  (1.626 m)   Wt 170 lb 12.8 oz (77.5 kg)   BMI 29.32 kg/m     Wt Readings from Last 3 Encounters:  02/28/17 170 lb 12.8 oz (77.5 kg)  02/10/17 172 lb (78 kg)  01/23/17 172 lb 6.4 oz (78.2 kg)     GEN:  Well nourished, well developed in no acute distress HEENT: Normal NECK: No JVD; No carotid bruits LYMPHATICS: No lymphadenopathy CARDIAC: RRR, no murmurs, rubs, gallops RESPIRATORY:  Clear to auscultation without rales, wheezing or rhonchi  ABDOMEN: Soft, non-tender, non-distended MUSCULOSKELETAL:  No edema; No deformity  SKIN: Warm and dry NEUROLOGIC:  Alert and oriented x 3 PSYCHIATRIC:  Normal affect   ASSESSMENT:    1. CAD in native artery   2. Lower extremity edema   3. Essential hypertension    PLAN:    In order of problems listed above:  Chest pain overall reassuring nuclear stress imaging. Her treadmill portion of the test is a false positive. Prior cardiac catheterization in 2002 with trivial  coronary artery disease.  -  if symptoms worsen or become more worrisome despite medical management, we could consider further evaluation with cardiac catheterization but at this time continue with secondary prevention.    lower extremity edema  - Likely dependent edema. Elevate legs. Support stockings. Avoid excessive salt.  - No evidence of DVT.  Essential hypertension  - On spironolactone.  - Last potassium 3.8. Excellent  Please let us know if he can be of further assistance. Assurance.  Medication Adjustments/Labs and Tests Ordered: Current medicines are reviewed at length with the patient today.  Concerns regarding medicines are outlined above. Labs and tests ordered and medication changes are outlined in  the patient instructions below:  Patient Instructions  Medication Instructions:  The current medical regimen is effective;  continue present plan and medications.  Follow-Up: Follow up as needed with Dr Marlou Porch.  Thank you for choosing [facility]!!        Signed, Candee Furbish, MD  02/28/2017 10:32 AM    [facility]

## 2017-02-28 NOTE — Assessment & Plan Note (Signed)
Vascular ultrasound was reassuring with no DVT on 02/06/17.   Nuclear stress EF: 64%. Normal wall motion  Downsloping ST segment depression ST segment depression of 2 mm was noted during stress in the II, III, aVF and V5 leads.  This is a low risk perfusion study. Normal perfusion pattern would suggest false positive exercise treadmill test results. There was no transient ischemic dilatation.  Similar findings were noted on prior nuclear stress test 2015.

## 2017-02-28 NOTE — Progress Notes (Signed)
Subjective:  Patient ID: Kelly Santos, female    DOB: 03-Jun-1946  Age: 71 y.o. MRN: [id number]  CC: Follow-up   HPI Kelly Santos presents for L ankle swelling F/u CP, HTN   Vascular ultrasound: no DVT on 02/06/17.   Nuclear stress EF: 64%. Normal wall motion  Downsloping ST segment depression ST segment depression of 2 mm was noted during stress in the II, III, aVF and V5 leads.  This is a low risk perfusion study. Normal perfusion pattern would suggest false positive exercise treadmill test results. There was no transient ischemic dilatation.  Similar findings were noted on prior nuclear stress test 2015.   Outpatient Medications Prior to Visit  Medication Sig Dispense Refill  . amitriptyline (ELAVIL) 25 MG tablet Take 1 tablet (25 mg total) by mouth daily as needed. 30 tablet 3  . amLODipine-benazepril (LOTREL) 10-40 MG capsule Take 1 capsule by mouth daily. 30 capsule 9  . benzonatate (TESSALON) 100 MG capsule Take 1-2 capsules (100-200 mg total) by mouth 3 (three) times daily as needed for cough. 40 capsule 0  . brimonidine-timolol (COMBIGAN) 0.2-0.5 % ophthalmic solution Place 1 drop into both eyes 2 (two) times daily.    . cetirizine (ZYRTEC) 10 MG tablet Take 1 tablet (10 mg total) by mouth daily. 30 tablet 11  . cholecalciferol (VITAMIN D) 1000 UNITS tablet Take 1,000 Units by mouth daily.    . Ferrous Sulfate (IRON) 325 (65 FE) MG TABS Take 1 tablet by mouth daily.    . fluticasone furoate-vilanterol (BREO ELLIPTA) 100-25 MCG/INH AEPB Inhale 1 puff into the lungs daily. 1 each 5  . hydrocortisone (PROCTOCORT) 1 % CREA Apply 1 application topically 2 (two) times daily as needed.    Marland Kitchen spironolactone (ALDACTONE) 25 MG tablet Take 1 tablet (25 mg total) by mouth daily. 30 tablet 1  . triamcinolone cream (KENALOG) 0.5 % Apply topically 2 (two) times daily as needed. 30 g 2  . ibuprofen (ADVIL,MOTRIN) 600 MG tablet TAKE 1 TABLET (600 MG TOTAL) BY MOUTH EVERY 6 (SIX) HOURS AS  NEEDED (MILD PAIN). (Patient not taking: Reported on 02/28/2017) 60 tablet 0   No facility-administered medications prior to visit.     ROS Review of Systems  Constitutional: Negative for activity change, appetite change, chills, fatigue and unexpected weight change.  HENT: Negative for congestion, mouth sores and sinus pressure.   Eyes: Negative for visual disturbance.  Respiratory: Negative for cough and chest tightness.   Cardiovascular: Positive for leg swelling.  Gastrointestinal: Negative for abdominal pain and nausea.  Genitourinary: Negative for difficulty urinating, frequency and vaginal pain.  Musculoskeletal: Positive for arthralgias. Negative for back pain and gait problem.  Skin: Negative for pallor and rash.  Neurological: Negative for dizziness, tremors, weakness, numbness and headaches.  Psychiatric/Behavioral: Negative for confusion and sleep disturbance.    Objective:  BP 112/68 (BP Location: Left Arm, Patient Position: Sitting, Cuff Size: Normal)   Pulse 74   Temp 98.5 F (36.9 C) (Oral)   Ht 5\' 4"  (1.626 m)   Wt 171 lb (77.6 kg)   SpO2 98%   BMI 29.35 kg/m   BP Readings from Last 3 Encounters:  02/28/17 112/68  02/28/17 138/88  01/23/17 (!) 142/100    Wt Readings from Last 3 Encounters:  02/28/17 171 lb (77.6 kg)  02/28/17 170 lb 12.8 oz (77.5 kg)  02/10/17 172 lb (78 kg)    Physical Exam  Constitutional: She appears well-developed. No distress.  HENT:  Head: Normocephalic.  Right Ear: External ear normal.  Left Ear: External ear normal.  Nose: Nose normal.  Mouth/Throat: Oropharynx is clear and moist.  Eyes: Conjunctivae are normal. Pupils are equal, round, and reactive to light. Right eye exhibits no discharge. Left eye exhibits no discharge.  Neck: Normal range of motion. Neck supple. No JVD present. No tracheal deviation present. No thyromegaly present.  Cardiovascular: Normal rate, regular rhythm and normal heart sounds.   Pulmonary/Chest:  No stridor. No respiratory distress. She has no wheezes.  Abdominal: Soft. Bowel sounds are normal. She exhibits no distension and no mass. There is no tenderness. There is no rebound and no guarding.  Musculoskeletal: She exhibits edema. She exhibits no tenderness.  Lymphadenopathy:    She has no cervical adenopathy.  Neurological: She displays normal reflexes. No cranial nerve deficit. She exhibits normal muscle tone. Coordination normal.  Skin: No rash noted. No erythema.  Psychiatric: She has a normal mood and affect. Her behavior is normal. Judgment and thought content normal.  R trace L ankle is more swollen laterally  Lab Results  Component Value Date   WBC 3.7 (L) 01/14/2017   HGB 12.7 01/14/2017   HCT 38.4 01/14/2017   PLT 224.0 01/14/2017   GLUCOSE 90 02/14/2017   CHOL 156 01/23/2017   TRIG 83 01/23/2017   HDL 55 01/23/2017   LDLCALC 84 01/23/2017   ALT 16 01/14/2017   AST 17 01/14/2017   NA 140 02/14/2017   K 3.8 02/14/2017   CL 103 02/14/2017   CREATININE 0.68 02/14/2017   BUN 13 02/14/2017   CO2 22 02/14/2017   TSH 0.62 01/14/2017   INR 0.96 05/09/2011   HGBA1C 5.8 (H) 01/23/2017    No results found.  Assessment & Plan:   There are no diagnoses linked to this encounter. I am having Ms. Plasencia maintain her cholecalciferol, brimonidine-timolol, Iron, triamcinolone cream, ibuprofen, amitriptyline, benzonatate, cetirizine, fluticasone furoate-vilanterol, hydrocortisone, amLODipine-benazepril, and spironolactone.  No orders of the defined types were placed in this encounter.    Follow-up: No Follow-up on file.  Walker Kehr, MD

## 2017-02-28 NOTE — Assessment & Plan Note (Signed)
Amlod-Benazepril - d/c due to swelling Take losartan in place of Amlodipine-Benazepril, Spironolactone

## 2017-03-27 ENCOUNTER — Telehealth: Payer: Self-pay | Admitting: Internal Medicine

## 2017-03-27 NOTE — Telephone Encounter (Signed)
Pt is concerned about losartan (COZAAR) 100 MG tablet  She believes it may be causing her BP to go up   She states she cannot get it to go back down since Plot took her off of Amlodipine  She would like a call back

## 2017-03-31 MED ORDER — TRIAMTERENE-HCTZ 37.5-25 MG PO TABS: 1.0000 | ORAL_TABLET | Freq: Every day | ORAL | 11 refills | Status: DC

## 2017-03-31 NOTE — Telephone Encounter (Signed)
Please add Maxzide Continue Losartan, Spironolactone RTC 2 wks w/BMET Thx

## 2017-03-31 NOTE — Telephone Encounter (Signed)
Spoke with patient and she states her B/P has been running around 170/100. She was stopped from her Amlodipine because she was having swelling in her ankles and was put on Losartan. She states she is still having swelling in her ankles. She didnt know if the Losartan was working or since she is still is having the occasional swelling if you want her to go back on the Amlodipine since that seeped to work better to her B/P.

## 2017-04-01 NOTE — Telephone Encounter (Signed)
Called pt no answer LMOM RTC.../lmb 

## 2017-04-03 ENCOUNTER — Other Ambulatory Visit: Payer: Self-pay | Admitting: Cardiology

## 2017-04-03 NOTE — Telephone Encounter (Signed)
This is very common to take 3 meds for BP Thx

## 2017-04-03 NOTE — Telephone Encounter (Signed)
Patient notified, Patient would like to why she has take 3 blood pressure medications.

## 2017-04-04 NOTE — Telephone Encounter (Signed)
LM notifying pt

## 2017-04-07 ENCOUNTER — Emergency Department (HOSPITAL_COMMUNITY)
Admission: EM | Admit: 2017-04-07 | Discharge: 2017-04-08 | Disposition: A | Discharge status: Home / Self Care | Payer: BLUE CROSS/BLUE SHIELD | Attending: Emergency Medicine | Admitting: Emergency Medicine

## 2017-04-07 ENCOUNTER — Encounter (HOSPITAL_COMMUNITY): Payer: Self-pay | Admitting: Emergency Medicine

## 2017-04-07 DIAGNOSIS — Z79899 Other long term (current) drug therapy: Secondary | ICD-10-CM | POA: Diagnosis not present

## 2017-04-07 DIAGNOSIS — I2510 Atherosclerotic heart disease of native coronary artery without angina pectoris: Secondary | ICD-10-CM | POA: Diagnosis not present

## 2017-04-07 DIAGNOSIS — R030 Elevated blood-pressure reading, without diagnosis of hypertension: Secondary | ICD-10-CM | POA: Diagnosis present

## 2017-04-07 DIAGNOSIS — I10 Essential (primary) hypertension: Secondary | ICD-10-CM | POA: Diagnosis not present

## 2017-04-07 LAB — URINALYSIS, ROUTINE W REFLEX MICROSCOPIC
BILIRUBIN URINE: NEGATIVE
Bacteria, UA: NONE SEEN
Glucose, UA: NEGATIVE mg/dL
HGB URINE DIPSTICK: NEGATIVE
Ketones, ur: NEGATIVE mg/dL
NITRITE: NEGATIVE
PH: 8 (ref 5.0–8.0)
Protein, ur: NEGATIVE mg/dL
Specific Gravity, Urine: 1.003 — ABNORMAL LOW (ref 1.005–1.030)

## 2017-04-07 LAB — CBC WITH DIFFERENTIAL/PLATELET
BASOS PCT: 1 %
Basophils Absolute: 0 10*3/uL (ref 0.0–0.1)
EOS ABS: 0.3 10*3/uL (ref 0.0–0.7)
Eosinophils Relative: 9 %
HCT: 31.2 % — ABNORMAL LOW (ref 36.0–46.0)
Hemoglobin: 10 g/dL — ABNORMAL LOW (ref 12.0–15.0)
LYMPHS PCT: 39 %
Lymphs Abs: 1.1 10*3/uL (ref 0.7–4.0)
MCH: 29.9 pg (ref 26.0–34.0)
MCHC: 32.1 g/dL (ref 30.0–36.0)
MCV: 93.1 fL (ref 78.0–100.0)
Monocytes Absolute: 0.2 10*3/uL (ref 0.1–1.0)
Monocytes Relative: 7 %
NEUTROS ABS: 1.3 10*3/uL — AB (ref 1.7–7.7)
Neutrophils Relative %: 44 %
Platelets: 272 10*3/uL (ref 150–400)
RBC: 3.35 MIL/uL — ABNORMAL LOW (ref 3.87–5.11)
RDW: 13.4 % (ref 11.5–15.5)
WBC: 2.9 10*3/uL — AB (ref 4.0–10.5)

## 2017-04-07 LAB — BASIC METABOLIC PANEL
ANION GAP: 7 (ref 5–15)
BUN: 11 mg/dL (ref 6–20)
CO2: 25 mmol/L (ref 22–32)
Calcium: 9.2 mg/dL (ref 8.9–10.3)
Chloride: 107 mmol/L (ref 101–111)
Creatinine, Ser: 0.72 mg/dL (ref 0.44–1.00)
GFR calc Af Amer: >60 mL/min (ref >60)
Glucose, Bld: 105 mg/dL — ABNORMAL HIGH (ref 65–99)
POTASSIUM: 3.4 mmol/L — AB (ref 3.5–5.1)
SODIUM: 139 mmol/L (ref 135–145)

## 2017-04-07 LAB — TROPONIN I

## 2017-04-07 MED ORDER — AMLODIPINE BESYLATE 5 MG PO TABS
5.0000 mg | ORAL_TABLET | Freq: Once | ORAL | Status: AC
  Administered 2017-04-07: 5 mg via ORAL
  Filled 2017-04-07: qty 1

## 2017-04-07 MED ORDER — HYDRALAZINE HCL 20 MG/ML IJ SOLN
20.0000 mg | Freq: Once | INTRAMUSCULAR | Status: AC
  Administered 2017-04-07: 20 mg via INTRAVENOUS
  Filled 2017-04-07: qty 1

## 2017-04-07 MED ORDER — LABETALOL HCL 5 MG/ML IV SOLN
20.0000 mg | Freq: Once | INTRAVENOUS | Status: AC
  Administered 2017-04-07: 20 mg via INTRAVENOUS
  Filled 2017-04-07: qty 4

## 2017-04-07 NOTE — ED Provider Notes (Signed)
[facility] DEPT Provider Note   CSN: [id number] Arrival date & time: 04/07/17  2023     History   Chief Complaint Chief Complaint  Patient presents with  . Hypertension    HPI Kelly Santos is a 71 y.o. female.  Pt presents to the ED today with elevated bp.  The pt said that her doctor changed her blood pressure medications in May b/c the amlodipine was causing leg swelling.  The pt said her bp has been going up since then.  The pt said that she called the nurse today who told her to come to the ED.  Pt denies any cp or sob.      Past Medical History:  Diagnosis Date  . Anemia   . CAD (coronary artery disease)    no cardiologist, followed by PCP only  . Cataract   . Diverticulosis   . Generalized headaches   . GERD (gastroesophageal reflux disease)   . Glaucoma   . Hemorrhoids   . Hiatal hernia    On CT done Jan 31, 2011   . Hx of colonic polyps   . Hypertension   . Hyperthyroidism   . Osteoarthritis   . Uterine polyp   . Vitamin D deficiency     Patient Active Problem List   Diagnosis Date Noted  . Edema 02/28/2017  . Acute upper respiratory infection 05/07/2016  . Cough 05/07/2016  . 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 Fatigue 11/02/2015  . Hyperglycemia 03/24/2015  . Colon polyp, hyperplastic 11/27/2012  . Low back pain 08/03/2012  . Foot pain, left 04/14/2012  . Callus of foot 04/14/2012  . Leg pain, left 07/09/2011  . Diastolic dysfunction 75/06/2584  . Hypokalemia 05/15/2011  . Thyrotoxicosis 03/26/2010  . RECTAL BLEEDING 03/26/2010  . HEMORRHOIDS 03/23/2010  . Anemia due to chronic blood loss 03/23/2010  . HERPES ZOSTER 11/09/2009  . Pain in limb 10/04/2009  . Headache(784.0) 10/04/2009  . TINEA PEDIS 06/20/2009  . ALLERGIC RHINITIS 12/15/2008  . Chest pain, atypical 11/08/2008  . CERVICAL RADICULOPATHY, RIGHT 03/25/2008  . KNEE PAIN, ACUTE 01/18/2008  . Vitamin D deficiency 11/21/2007  . ABNORMAL CHEST XRAY 11/21/2007  . Essential  hypertension 11/18/2007  . Coronary atherosclerosis 11/18/2007  . GERD 11/18/2007  . DIVERTICULOSIS, COLON 11/18/2007  . OSTEOARTHRITIS 11/18/2007  . PARESTHESIA 11/18/2007    Past Surgical History:  Procedure Laterality Date  . ABDOMINAL HYSTERECTOMY N/A 04/26/2013   Procedure: HYSTERECTOMY ABDOMINAL;  Surgeon: Cyril Mourning, MD;  Location: [facility];  Service: Gynecology;  Laterality: N/A;  . DILATION AND CURETTAGE OF UTERUS  01/18/2013   with uterine polypectomy  . SALPINGOOPHORECTOMY Bilateral 04/26/2013   Procedure: SALPINGO OOPHORECTOMY;  Surgeon: Cyril Mourning, MD;  Location: [facility];  Service: Gynecology;  Laterality: Bilateral;    OB History    No data available       Home Medications    Prior to Admission medications   Medication Sig Start Date End Date Taking? Authorizing Provider  amitriptyline (ELAVIL) 25 MG tablet Take 1 tablet (25 mg total) by mouth daily as needed. Patient taking differently: Take 25 mg by mouth daily as needed for sleep.  04/01/16  Yes Plotnikov, Evie Lacks, MD  brimonidine-timolol (COMBIGAN) 0.2-0.5 % ophthalmic solution Place 1 drop into both eyes 2 (two) times daily.   Yes [provider]  cholecalciferol (VITAMIN D) 1000 UNITS tablet Take 1,000 Units by mouth every other day.    Yes [provider]  Ferrous Sulfate (  IRON) 325 (65 FE) MG TABS Take 1 tablet by mouth 3 (three) times a week.    Yes [provider]  losartan (COZAAR) 100 MG tablet Take 1 tablet (100 mg total) by mouth daily. 02/28/17  Yes Plotnikov, Evie Lacks, MD  spironolactone (ALDACTONE) 25 MG tablet TAKE 1 TABLET BY MOUTH EVERY DAY 04/03/17  Yes Isaiah Serge, NP  cetirizine (ZYRTEC) 10 MG tablet Take 1 tablet (10 mg total) by mouth daily. Patient not taking: Reported on 04/07/2017 04/23/16   Jaynee Eagles, PA-C  fluticasone furoate-vilanterol (BREO ELLIPTA) 100-25 MCG/INH AEPB Inhale 1 puff into the lungs daily. Patient not taking: Reported on 04/07/2017  05/07/16   Plotnikov, Evie Lacks, MD  hydrocortisone (PROCTOCORT) 1 % CREA Apply 1 application topically 2 (two) times daily as needed (dry skin).     [provider]  ibuprofen (ADVIL,MOTRIN) 600 MG tablet Take twice a day x 2 weeks, then prn pain Patient not taking: Reported on 04/07/2017 02/28/17   Plotnikov, Evie Lacks, MD  triamcinolone cream (KENALOG) 0.5 % Apply topically 2 (two) times daily as needed. Patient not taking: Reported on 04/07/2017 01/22/16   Plotnikov, Evie Lacks, MD  triamterene-hydrochlorothiazide (MAXZIDE-25) 37.5-25 MG tablet Take 1 tablet by mouth daily. Patient not taking: Reported on 04/07/2017 03/31/17 03/31/18  Plotnikov, Evie Lacks, MD    Family History Family History  Problem Relation Age of Onset  . Hypertension Mother   . Hypertension Father   . Heart failure Father   . Deep vein thrombosis Son 35       x 2, chronic coumadin  . Hypertension Sister   . CVA Brother   . Diabetes Brother   . Hypertension Brother   . Colon cancer Neg Hx     Social History Social History  Substance Use Topics  . Smoking status: Never Smoker  . Smokeless tobacco: Never Used  . Alcohol use No     Allergies   Amlodipine   Review of Systems Review of Systems  All other systems reviewed and are negative.    Physical Exam Updated Vital Signs BP (!) 184/94   Pulse (!) 54   Temp 98 F (36.7 C) (Oral)   Resp 15   SpO2 100%   Physical Exam  Constitutional: She is oriented to person, place, and time. She appears well-developed and well-nourished.  HENT:  Head: Normocephalic and atraumatic.  Right Ear: External ear normal.  Left Ear: External ear normal.  Nose: Nose normal.  Mouth/Throat: Oropharynx is clear and moist.  Eyes: Conjunctivae and EOM are normal. Pupils are equal, round, and reactive to light.  Neck: Normal range of motion. Neck supple.  Cardiovascular: Normal rate, regular rhythm, normal heart sounds and intact distal pulses.   Pulmonary/Chest: Effort  normal and breath sounds normal.  Abdominal: Soft. Bowel sounds are normal.  Musculoskeletal: Normal range of motion.  Neurological: She is alert and oriented to person, place, and time.  Skin: Skin is warm.  Psychiatric: She has a normal mood and affect. Her behavior is normal. Judgment and thought content normal.  Nursing note and vitals reviewed.    ED Treatments / Results  Labs (all labs ordered are listed, but only abnormal results are displayed) Labs Reviewed  BASIC METABOLIC PANEL - Abnormal; Notable for the following:       Result Value   Potassium 3.4 (*)    Glucose, Bld 105 (*)    All other components within normal limits  CBC WITH DIFFERENTIAL/PLATELET - Abnormal;  Notable for the following:    WBC 2.9 (*)    RBC 3.35 (*)    Hemoglobin 10.0 (*)    HCT 31.2 (*)    Neutro Abs 1.3 (*)    All other components within normal limits  URINALYSIS, ROUTINE W REFLEX MICROSCOPIC - Abnormal; Notable for the following:    Color, Urine COLORLESS (*)    Specific Gravity, Urine 1.003 (*)    Leukocytes, UA MODERATE (*)    Squamous Epithelial / LPF 0-5 (*)    All other components within normal limits  TROPONIN I    EKG  EKG Interpretation  Date/Time:  Monday April 07 2017 21:16:31 EDT Ventricular Rate:  58 PR Interval:    QRS Duration: 91 QT Interval:  489 QTC Calculation: 481 R Axis:   26 Text Interpretation:  Sinus rhythm Left ventricular hypertrophy No significant change since last tracing Confirmed by Isla Pence [phone]) on 04/07/2017 9:24:30 PM       Radiology No results found.  Procedures Procedures (including critical care time)  Medications Ordered in ED Medications  amLODipine (NORVASC) tablet 5 mg (not administered)  labetalol (NORMODYNE,TRANDATE) injection 20 mg (20 mg Intravenous Given 04/07/17 2148)     Initial Impression / Assessment and Plan / ED Course  I have reviewed the triage vital signs and the nursing notes.  Pertinent labs & imaging  results that were available during my care of the patient were reviewed by me and considered in my medical decision making (see chart for details).    BP down a little with labetalol.  Pt has no signs of end organ damage.  Pt is encouraged to f/u with pcp for bp control.  She is instructed to return if worse.  Final Clinical Impressions(s) / ED Diagnoses   Final diagnoses:  Essential hypertension    New Prescriptions New Prescriptions   No medications on file     Isla Pence, MD 04/07/17 2308

## 2017-04-07 NOTE — ED Triage Notes (Signed)
Pt from home states she checked her BP at home and it was elevated called PMD  And his nurse encouraged her to be evaluated at the nearest ED. EMS report BP elevated x1 month after BP meds changed by PMD and cardiologist  VS 190/p p: 60 R: 18

## 2017-04-07 NOTE — Discharge Instructions (Signed)
Call your doctor in the morning to see what medication he would like you on for your blood pressure.

## 2017-04-07 NOTE — ED Notes (Signed)
Family and pt express concern about BP remaining elevated EDP aware order for normadyne obtained

## 2017-04-07 NOTE — ED Notes (Signed)
Bed: RC16 Expected date:  Expected time:  Means of arrival:  Comments: EMS 71 yo female from home/blood pressure meds adjusted 1 month ago with current elevated blood pressure-wants evaluation

## 2017-04-10 ENCOUNTER — Encounter: Payer: Self-pay | Admitting: Internal Medicine

## 2017-04-10 ENCOUNTER — Ambulatory Visit (INDEPENDENT_AMBULATORY_CARE_PROVIDER_SITE_OTHER): Payer: BLUE CROSS/BLUE SHIELD | Admitting: Internal Medicine

## 2017-04-10 DIAGNOSIS — I10 Essential (primary) hypertension: Secondary | ICD-10-CM

## 2017-04-10 MED ORDER — AZILSARTAN-CHLORTHALIDONE 40-25 MG PO TABS: ORAL_TABLET | ORAL | 11 refills | Status: DC

## 2017-04-10 MED ORDER — CARVEDILOL 25 MG PO TABS: 25.0000 mg | ORAL_TABLET | Freq: Two times a day (BID) | ORAL | 11 refills | Status: DC

## 2017-04-10 MED ORDER — CLONIDINE HCL 0.1 MG PO TABS
0.1000 mg | ORAL_TABLET | Freq: Two times a day (BID) | ORAL | 1 refills | Status: DC | PRN
Start: 2017-04-10 — End: 2020-03-27

## 2017-04-10 NOTE — Assessment & Plan Note (Signed)
Stop Spironolactone and Losartan Start Edarbychlor and Carvedilol Take Carvedilol 1/2 tablet twice a day for 6 days, then one twice a day Take Clonidine as needed if the blood pressure is >170/110

## 2017-04-10 NOTE — Progress Notes (Signed)
Subjective:  Patient ID: Kelly Santos, female    DOB: 03-02-1946  Age: 71 y.o. MRN: [id number]  CC: No chief complaint on file.   HPI Kelly Santos presents for high BP and had to go to ER for SBP of 180. She never got Maxzide.    Outpatient Medications Prior to Visit  Medication Sig Dispense Refill  . amitriptyline (ELAVIL) 25 MG tablet Take 1 tablet (25 mg total) by mouth daily as needed. (Patient taking differently: Take 25 mg by mouth daily as needed for sleep. ) 30 tablet 3  . brimonidine-timolol (COMBIGAN) 0.2-0.5 % ophthalmic solution Place 1 drop into both eyes 2 (two) times daily.    . cetirizine (ZYRTEC) 10 MG tablet Take 1 tablet (10 mg total) by mouth daily. 30 tablet 11  . cholecalciferol (VITAMIN D) 1000 UNITS tablet Take 1,000 Units by mouth every other day.     . Ferrous Sulfate (IRON) 325 (65 FE) MG TABS Take 1 tablet by mouth 3 (three) times a week.     . fluticasone furoate-vilanterol (BREO ELLIPTA) 100-25 MCG/INH AEPB Inhale 1 puff into the lungs daily. 1 each 5  . hydrocortisone (PROCTOCORT) 1 % CREA Apply 1 application topically 2 (two) times daily as needed (dry skin).     Marland Kitchen ibuprofen (ADVIL,MOTRIN) 600 MG tablet Take twice a day x 2 weeks, then prn pain 60 tablet 3  . losartan (COZAAR) 100 MG tablet Take 1 tablet (100 mg total) by mouth daily. 90 tablet 3  . spironolactone (ALDACTONE) 25 MG tablet TAKE 1 TABLET BY MOUTH EVERY DAY 90 tablet 1  . triamcinolone cream (KENALOG) 0.5 % Apply topically 2 (two) times daily as needed. 30 g 2  . triamterene-hydrochlorothiazide (MAXZIDE-25) 37.5-25 MG tablet Take 1 tablet by mouth daily. 30 tablet 11   No facility-administered medications prior to visit.     ROS Review of Systems  Constitutional: Negative for activity change, appetite change, chills, fatigue and unexpected weight change.  HENT: Negative for congestion, mouth sores and sinus pressure.   Eyes: Negative for visual disturbance.  Respiratory: Negative for  cough and chest tightness.   Gastrointestinal: Negative for abdominal pain and nausea.  Genitourinary: Negative for difficulty urinating, frequency and vaginal pain.  Musculoskeletal: Negative for back pain and gait problem.  Skin: Negative for pallor and rash.  Neurological: Negative for dizziness, tremors, weakness, numbness and headaches.  Psychiatric/Behavioral: Negative for confusion and sleep disturbance.    Objective:  BP 118/64 (BP Location: Left Arm, Patient Position: Sitting, Cuff Size: Large)   Pulse 70   Temp 98.3 F (36.8 C) (Oral)   Ht 5\' 4"  (1.626 m)   Wt 165 lb (74.8 kg)   SpO2 99%   BMI 28.32 kg/m   BP Readings from Last 3 Encounters:  04/10/17 118/64  04/08/17 (!) 163/90  02/28/17 112/68    Wt Readings from Last 3 Encounters:  04/10/17 165 lb (74.8 kg)  02/28/17 171 lb (77.6 kg)  02/28/17 170 lb 12.8 oz (77.5 kg)    Physical Exam  Constitutional: She appears well-developed. No distress.  HENT:  Head: Normocephalic.  Right Ear: External ear normal.  Left Ear: External ear normal.  Nose: Nose normal.  Mouth/Throat: Oropharynx is clear and moist.  Eyes: Pupils are equal, round, and reactive to light. Conjunctivae are normal. Right eye exhibits no discharge. Left eye exhibits no discharge.  Neck: Normal range of motion. Neck supple. No JVD present. No tracheal deviation present. No thyromegaly present.  Cardiovascular: Normal rate, regular rhythm and normal heart sounds.   Pulmonary/Chest: No stridor. No respiratory distress. She has no wheezes.  Abdominal: Soft. Bowel sounds are normal. She exhibits no distension and no mass. There is no tenderness. There is no rebound and no guarding.  Musculoskeletal: She exhibits no edema or tenderness.  Lymphadenopathy:    She has no cervical adenopathy.  Neurological: She displays normal reflexes. No cranial nerve deficit. She exhibits normal muscle tone. Coordination normal.  Skin: No rash noted. No erythema.    Psychiatric: She has a normal mood and affect. Her behavior is normal. Judgment and thought content normal.    Lab Results  Component Value Date   WBC 2.9 (L) 04/07/2017   HGB 10.0 (L) 04/07/2017   HCT 31.2 (L) 04/07/2017   PLT 272 04/07/2017   GLUCOSE 105 (H) 04/07/2017   CHOL 156 01/23/2017   TRIG 83 01/23/2017   HDL 55 01/23/2017   LDLCALC 84 01/23/2017   ALT 16 01/14/2017   AST 17 01/14/2017   NA 139 04/07/2017   K 3.4 (L) 04/07/2017   CL 107 04/07/2017   CREATININE 0.72 04/07/2017   BUN 11 04/07/2017   CO2 25 04/07/2017   TSH 0.62 01/14/2017   INR 0.96 05/09/2011   HGBA1C 5.8 (H) 01/23/2017    No results found.  Assessment & Plan:   There are no diagnoses linked to this encounter. I am having Ms. Schaad maintain her cholecalciferol, brimonidine-timolol, Iron, triamcinolone cream, amitriptyline, cetirizine, fluticasone furoate-vilanterol, hydrocortisone, losartan, ibuprofen, triamterene-hydrochlorothiazide, and spironolactone.  No orders of the defined types were placed in this encounter.    Follow-up: No Follow-up on file.  Walker Kehr, MD

## 2017-04-10 NOTE — Patient Instructions (Signed)
Stop Spironolactone and Losartan Start Edarbychlor and Carvedilol Take Carvedilol 1/2 tablet twice a day for 6 days, then one twice a day Take Clonidine as needed if the blood pressure is >170/110

## 2017-04-16 ENCOUNTER — Ambulatory Visit: Payer: BLUE CROSS/BLUE SHIELD | Admitting: Internal Medicine

## 2017-05-29 ENCOUNTER — Ambulatory Visit: Payer: BLUE CROSS/BLUE SHIELD | Admitting: Internal Medicine

## 2017-06-06 ENCOUNTER — Ambulatory Visit: Payer: BLUE CROSS/BLUE SHIELD | Admitting: Internal Medicine

## 2017-10-02 DIAGNOSIS — Z13820 Encounter for screening for osteoporosis: Secondary | ICD-10-CM | POA: Diagnosis not present

## 2017-10-02 DIAGNOSIS — Z01419 Encounter for gynecological examination (general) (routine) without abnormal findings: Secondary | ICD-10-CM | POA: Diagnosis not present

## 2017-10-02 DIAGNOSIS — Z1231 Encounter for screening mammogram for malignant neoplasm of breast: Secondary | ICD-10-CM | POA: Diagnosis not present

## 2017-10-02 DIAGNOSIS — Z6829 Body mass index (BMI) 29.0-29.9, adult: Secondary | ICD-10-CM | POA: Diagnosis not present

## 2017-10-03 LAB — HM DEXA SCAN

## 2017-10-03 LAB — HM MAMMOGRAPHY

## 2017-10-24 ENCOUNTER — Other Ambulatory Visit: Payer: Self-pay | Admitting: Internal Medicine

## 2017-11-14 ENCOUNTER — Ambulatory Visit: Payer: BLUE CROSS/BLUE SHIELD | Admitting: Internal Medicine

## 2017-11-14 ENCOUNTER — Encounter: Payer: Self-pay | Admitting: Internal Medicine

## 2017-11-14 ENCOUNTER — Other Ambulatory Visit (INDEPENDENT_AMBULATORY_CARE_PROVIDER_SITE_OTHER): Payer: BLUE CROSS/BLUE SHIELD

## 2017-11-14 ENCOUNTER — Other Ambulatory Visit: Payer: BLUE CROSS/BLUE SHIELD

## 2017-11-14 ENCOUNTER — Telehealth: Payer: Self-pay | Admitting: Internal Medicine

## 2017-11-14 ENCOUNTER — Ambulatory Visit (INDEPENDENT_AMBULATORY_CARE_PROVIDER_SITE_OTHER)
Admission: RE | Admit: 2017-11-14 | Discharge: 2017-11-14 | Disposition: A | Discharge status: Home / Self Care | Payer: BLUE CROSS/BLUE SHIELD | Source: Ambulatory Visit | Attending: Internal Medicine | Admitting: Internal Medicine

## 2017-11-14 DIAGNOSIS — R110 Nausea: Secondary | ICD-10-CM

## 2017-11-14 DIAGNOSIS — R1031 Right lower quadrant pain: Secondary | ICD-10-CM

## 2017-11-14 DIAGNOSIS — R109 Unspecified abdominal pain: Secondary | ICD-10-CM | POA: Diagnosis not present

## 2017-11-14 LAB — BASIC METABOLIC PANEL
BUN: 14 mg/dL (ref 6–23)
CHLORIDE: 102 meq/L (ref 96–112)
CO2: 32 meq/L (ref 19–32)
Calcium: 9.3 mg/dL (ref 8.4–10.5)
Creatinine, Ser: 0.8 mg/dL (ref 0.40–1.20)
GFR: 90.82 mL/min (ref >60.00)
Glucose, Bld: 107 mg/dL — ABNORMAL HIGH (ref 70–99)
POTASSIUM: 3 meq/L — AB (ref 3.5–5.1)
SODIUM: 140 meq/L (ref 135–145)

## 2017-11-14 LAB — URINALYSIS
Bilirubin Urine: NEGATIVE
Hgb urine dipstick: NEGATIVE
Ketones, ur: NEGATIVE
LEUKOCYTES UA: NEGATIVE
Nitrite: NEGATIVE
SPECIFIC GRAVITY, URINE: 1.01 (ref 1.000–1.030)
Total Protein, Urine: NEGATIVE
UROBILINOGEN UA: 0.2 (ref 0.0–1.0)
Urine Glucose: NEGATIVE
pH: 7.5 (ref 5.0–8.0)

## 2017-11-14 LAB — CBC WITH DIFFERENTIAL/PLATELET
BASOS ABS: 0 10*3/uL (ref 0.0–0.1)
Basophils Relative: 1.3 % (ref 0.0–3.0)
Eosinophils Absolute: 0.1 10*3/uL (ref 0.0–0.7)
Eosinophils Relative: 4.7 % (ref 0.0–5.0)
HCT: 35.4 % — ABNORMAL LOW (ref 36.0–46.0)
Hemoglobin: 12 g/dL (ref 12.0–15.0)
LYMPHS ABS: 0.9 10*3/uL (ref 0.7–4.0)
Lymphocytes Relative: 31.3 % (ref 12.0–46.0)
MCHC: 33.8 g/dL (ref 30.0–36.0)
MCV: 99.7 fl (ref 78.0–100.0)
MONO ABS: 0.3 10*3/uL (ref 0.1–1.0)
MONOS PCT: 10.4 % (ref 3.0–12.0)
NEUTROS PCT: 52.3 % (ref 43.0–77.0)
Neutro Abs: 1.6 10*3/uL (ref 1.4–7.7)
Platelets: 207 10*3/uL (ref 150.0–400.0)
RBC: 3.55 Mil/uL — AB (ref 3.87–5.11)
RDW: 12.6 % (ref 11.5–15.5)
WBC: 3 10*3/uL — ABNORMAL LOW (ref 4.0–10.5)

## 2017-11-14 LAB — HEPATIC FUNCTION PANEL
ALBUMIN: 3.9 g/dL (ref 3.5–5.2)
ALK PHOS: 39 U/L (ref 39–117)
ALT: 16 U/L (ref 0–35)
AST: 17 U/L (ref 0–37)
Bilirubin, Direct: 0.1 mg/dL (ref 0.0–0.3)
TOTAL PROTEIN: 7.4 g/dL (ref 6.0–8.3)
Total Bilirubin: 0.4 mg/dL (ref 0.2–1.2)

## 2017-11-14 LAB — SEDIMENTATION RATE: Sed Rate: 17 mm/hr (ref 0–30)

## 2017-11-14 MED ORDER — IOPAMIDOL (ISOVUE-300) INJECTION 61%: 100.0000 mL | Freq: Once | INTRAVENOUS | Status: DC | PRN

## 2017-11-14 NOTE — Assessment & Plan Note (Signed)
R/o appendicitis Labs stat CT abd today NPO but water Pt declined ER

## 2017-11-14 NOTE — Progress Notes (Signed)
Subjective:  Patient ID: Kelly Santos, female    DOB: 04/27/1946  Age: 72 y.o. MRN: [id number]  CC: No chief complaint on file.   HPI Kelly Santos presents for R flank pain since Tue. Now it has moved into her RLQ. C/o nausea. She slept last night. Feeling better some...  Outpatient Medications Prior to Visit  Medication Sig Dispense Refill  . amitriptyline (ELAVIL) 25 MG tablet Take 1 tablet (25 mg total) by mouth daily as needed. Patient needs office visit before refills will be given 30 tablet 0  . Azilsartan-Chlorthalidone (EDARBYCLOR) 40-25 MG TABS 1 po qam 30 tablet 11  . brimonidine-timolol (COMBIGAN) 0.2-0.5 % ophthalmic solution Place 1 drop into both eyes 2 (two) times daily.    . carvedilol (COREG) 25 MG tablet Take 1 tablet (25 mg total) by mouth 2 (two) times daily. 60 tablet 11  . cetirizine (ZYRTEC) 10 MG tablet Take 1 tablet (10 mg total) by mouth daily. 30 tablet 11  . cholecalciferol (VITAMIN D) 1000 UNITS tablet Take 1,000 Units by mouth every other day.     . cloNIDine (CATAPRES) 0.1 MG tablet Take 1 tablet (0.1 mg total) by mouth 2 (two) times daily as needed. Take if BP>170/110 60 tablet 1  . Ferrous Sulfate (IRON) 325 (65 FE) MG TABS Take 1 tablet by mouth 3 (three) times a week.     . fluticasone furoate-vilanterol (BREO ELLIPTA) 100-25 MCG/INH AEPB Inhale 1 puff into the lungs daily. 1 each 5  . hydrocortisone (PROCTOCORT) 1 % CREA Apply 1 application topically 2 (two) times daily as needed (dry skin).     Marland Kitchen ibuprofen (ADVIL,MOTRIN) 600 MG tablet Take twice a day x 2 weeks, then prn pain 60 tablet 3  . triamcinolone cream (KENALOG) 0.5 % Apply topically 2 (two) times daily as needed. 30 g 2   No facility-administered medications prior to visit.     ROS Review of Systems  Constitutional: Negative for activity change, appetite change, chills, fatigue and unexpected weight change.  HENT: Negative for congestion, mouth sores and sinus pressure.   Eyes: Negative  for visual disturbance.  Respiratory: Negative for cough and chest tightness.   Gastrointestinal: Positive for abdominal pain and nausea.  Genitourinary: Negative for difficulty urinating, frequency and vaginal pain.  Musculoskeletal: Negative for back pain and gait problem.  Skin: Negative for pallor and rash.  Neurological: Negative for dizziness, tremors, weakness, numbness and headaches.  Psychiatric/Behavioral: Negative for confusion and sleep disturbance.    Objective:  BP 126/84 (BP Location: Left Arm, Patient Position: Sitting, Cuff Size: Large)   Pulse 60   Temp 98.7 F (37.1 C) (Oral)   Ht 5\' 4"  (1.626 m)   Wt 175 lb (79.4 kg)   SpO2 99%   BMI 30.04 kg/m   BP Readings from Last 3 Encounters:  11/14/17 126/84  04/10/17 118/64  04/08/17 (!) 163/90    Wt Readings from Last 3 Encounters:  11/14/17 175 lb (79.4 kg)  04/10/17 165 lb (74.8 kg)  02/28/17 171 lb (77.6 kg)    Physical Exam  Constitutional: She appears well-developed. No distress.  HENT:  Head: Normocephalic.  Right Ear: External ear normal.  Left Ear: External ear normal.  Nose: Nose normal.  Mouth/Throat: Oropharynx is clear and moist.  Eyes: Conjunctivae are normal. Pupils are equal, round, and reactive to light. Right eye exhibits no discharge. Left eye exhibits no discharge.  Neck: Normal range of motion. Neck supple. No JVD present. No  tracheal deviation present. No thyromegaly present.  Cardiovascular: Normal rate, regular rhythm and normal heart sounds.  Pulmonary/Chest: No stridor. No respiratory distress. She has no wheezes.  Abdominal: Soft. Bowel sounds are normal. She exhibits no mass. There is tenderness. There is rebound and guarding.  Musculoskeletal: She exhibits no edema or tenderness.  Lymphadenopathy:    She has no cervical adenopathy.  Neurological: She displays normal reflexes. No cranial nerve deficit. She exhibits normal muscle tone. Coordination normal.  Skin: No rash noted.  No erythema.  Psychiatric: She has a normal mood and affect. Her behavior is normal. Judgment and thought content normal.  RLQ is tender w/some guarding +/- rebound No rash  Lab Results  Component Value Date   WBC 2.9 (L) 04/07/2017   HGB 10.0 (L) 04/07/2017   HCT 31.2 (L) 04/07/2017   PLT 272 04/07/2017   GLUCOSE 105 (H) 04/07/2017   CHOL 156 01/23/2017   TRIG 83 01/23/2017   HDL 55 01/23/2017   LDLCALC 84 01/23/2017   ALT 16 01/14/2017   AST 17 01/14/2017   NA 139 04/07/2017   K 3.4 (L) 04/07/2017   CL 107 04/07/2017   CREATININE 0.72 04/07/2017   BUN 11 04/07/2017   CO2 25 04/07/2017   TSH 0.62 01/14/2017   INR 0.96 05/09/2011   HGBA1C 5.8 (H) 01/23/2017    No results found.  Assessment & Plan:   There are no diagnoses linked to this encounter. I am having Kelly Santos maintain her cholecalciferol, brimonidine-timolol, Iron, triamcinolone cream, cetirizine, fluticasone furoate-vilanterol, hydrocortisone, ibuprofen, Azilsartan-Chlorthalidone, carvedilol, cloNIDine, and amitriptyline.  No orders of the defined types were placed in this encounter.    Follow-up: No Follow-up on file.  Walker Kehr, MD

## 2017-11-14 NOTE — Telephone Encounter (Unsigned)
Copied from [place]. Topic: Inquiry >> Nov 14, 2017  5:06 PM Neva Seat wrote: Pt is asking what are the results are from the CT scan.  Needing to know if everything came out clear.

## 2017-11-14 NOTE — Patient Instructions (Signed)
Gto ER if worse

## 2017-11-17 NOTE — Telephone Encounter (Signed)
Yes.  Please start taking potassium.  Potassium level was low at 3.0.  Thank you

## 2017-11-17 NOTE — Telephone Encounter (Signed)
Pt notified last year to stop taking potassium by the cardiologist and would like just verify to start taking it due to lab levels being low.

## 2017-11-19 NOTE — Telephone Encounter (Signed)
Advised patient of dr plotnikov's note/instructions 

## 2017-11-21 ENCOUNTER — Other Ambulatory Visit: Payer: Self-pay | Admitting: Internal Medicine

## 2017-12-29 DIAGNOSIS — H16223 Keratoconjunctivitis sicca, not specified as Sjogren's, bilateral: Secondary | ICD-10-CM | POA: Diagnosis not present

## 2017-12-29 DIAGNOSIS — H1045 Other chronic allergic conjunctivitis: Secondary | ICD-10-CM | POA: Diagnosis not present

## 2018-04-15 ENCOUNTER — Other Ambulatory Visit: Payer: Self-pay | Admitting: Internal Medicine

## 2018-04-16 ENCOUNTER — Other Ambulatory Visit: Payer: Self-pay | Admitting: Internal Medicine

## 2018-04-20 ENCOUNTER — Telehealth: Payer: Self-pay | Admitting: Internal Medicine

## 2018-04-20 MED ORDER — AZILSARTAN-CHLORTHALIDONE 40-25 MG PO TABS: 1.0000 | ORAL_TABLET | Freq: Every morning | ORAL | 0 refills | Status: DC

## 2018-04-20 NOTE — Telephone Encounter (Signed)
Sent 30 day script pt is overduefor appt must see MD for future refills...Kelly Santos

## 2018-04-20 NOTE — Telephone Encounter (Signed)
Copied from [place] [id number]. Topic: Quick Communication - Rx Refill/Question >> Apr 20, 2018  2:01 PM Cecelia Byars, NT wrote: Medication:  Azilsartan-Chlorthalidone (EDARBYCLOR) 40-25 MG TABS , the patient was told by pharmacy they are needing an authorization for the medication   Has the patient contacted their pharmacy? yes  (Agent: If no, request that the patient contact the pharmacy for the refill. (Agent: If yes, when and what did the pharmacy advise?  Preferred Pharmacy (with phone number or street name [id number] - [place], [place] [phone] (Phone) [phone] (Fax)      Agent: Please be advised that RX refills may take up to 3 business days. We ask that you follow-up with your pharmacy.

## 2018-04-27 ENCOUNTER — Telehealth: Payer: Self-pay | Admitting: Internal Medicine

## 2018-04-27 NOTE — Telephone Encounter (Signed)
Copied from [place] [id number]. Topic: General - Other >> Apr 27, 2018  3:47 PM Mcneil, Ja-Kwan wrote: Reason for CRM: Pt states she spoke with her Optometrist and she was told that a prior authorization is needed. Pt request that prior authorization be submitted for the Rx for Azilsartan-Chlorthalidone (EDARBYCLOR) 40-25 MG TABS

## 2018-04-28 NOTE — Telephone Encounter (Signed)
Patient called to follow up on prior authorization for medication .She is requesting a nurse from the practice call her in regards to this matter as soon as possible

## 2018-04-29 NOTE — Telephone Encounter (Signed)
Please have Dr Alain Marion call  [phone] to give Authorization.

## 2018-05-06 ENCOUNTER — Other Ambulatory Visit: Payer: Self-pay

## 2018-05-06 ENCOUNTER — Emergency Department (HOSPITAL_COMMUNITY)
Admission: EM | Admit: 2018-05-06 | Discharge: 2018-05-06 | Disposition: A | Discharge status: Home / Self Care | Payer: BLUE CROSS/BLUE SHIELD | Attending: Emergency Medicine | Admitting: Emergency Medicine

## 2018-05-06 ENCOUNTER — Emergency Department (HOSPITAL_BASED_OUTPATIENT_CLINIC_OR_DEPARTMENT_OTHER): Payer: BLUE CROSS/BLUE SHIELD

## 2018-05-06 ENCOUNTER — Emergency Department (HOSPITAL_COMMUNITY): Payer: BLUE CROSS/BLUE SHIELD

## 2018-05-06 ENCOUNTER — Encounter (HOSPITAL_COMMUNITY): Payer: Self-pay

## 2018-05-06 DIAGNOSIS — Z79899 Other long term (current) drug therapy: Secondary | ICD-10-CM | POA: Insufficient documentation

## 2018-05-06 DIAGNOSIS — I2510 Atherosclerotic heart disease of native coronary artery without angina pectoris: Secondary | ICD-10-CM | POA: Insufficient documentation

## 2018-05-06 DIAGNOSIS — I10 Essential (primary) hypertension: Secondary | ICD-10-CM | POA: Diagnosis not present

## 2018-05-06 DIAGNOSIS — M7989 Other specified soft tissue disorders: Secondary | ICD-10-CM

## 2018-05-06 DIAGNOSIS — R0789 Other chest pain: Secondary | ICD-10-CM | POA: Insufficient documentation

## 2018-05-06 DIAGNOSIS — R2242 Localized swelling, mass and lump, left lower limb: Secondary | ICD-10-CM | POA: Insufficient documentation

## 2018-05-06 DIAGNOSIS — M542 Cervicalgia: Secondary | ICD-10-CM | POA: Insufficient documentation

## 2018-05-06 DIAGNOSIS — M79609 Pain in unspecified limb: Secondary | ICD-10-CM

## 2018-05-06 DIAGNOSIS — R079 Chest pain, unspecified: Secondary | ICD-10-CM | POA: Diagnosis not present

## 2018-05-06 LAB — BASIC METABOLIC PANEL
Anion gap: 6 (ref 5–15)
BUN: 8 mg/dL (ref 8–23)
CO2: 25 mmol/L (ref 22–32)
CREATININE: 0.73 mg/dL (ref 0.44–1.00)
Calcium: 9.1 mg/dL (ref 8.9–10.3)
Chloride: 108 mmol/L (ref 98–111)
GFR calc Af Amer: >60 mL/min (ref >60)
GLUCOSE: 97 mg/dL (ref 70–99)
Potassium: 3.4 mmol/L — ABNORMAL LOW (ref 3.5–5.1)
Sodium: 139 mmol/L (ref 135–145)

## 2018-05-06 LAB — CBC
HEMATOCRIT: 33.2 % — AB (ref 36.0–46.0)
Hemoglobin: 10.8 g/dL — ABNORMAL LOW (ref 12.0–15.0)
MCH: 33 pg (ref 26.0–34.0)
MCHC: 32.5 g/dL (ref 30.0–36.0)
MCV: 101.5 fL — ABNORMAL HIGH (ref 78.0–100.0)
PLATELETS: 184 10*3/uL (ref 150–400)
RBC: 3.27 MIL/uL — ABNORMAL LOW (ref 3.87–5.11)
RDW: 13 % (ref 11.5–15.5)
WBC: 2.7 10*3/uL — ABNORMAL LOW (ref 4.0–10.5)

## 2018-05-06 LAB — I-STAT TROPONIN, ED: Troponin i, poc: 0 ng/mL (ref 0.00–0.08)

## 2018-05-06 MED ORDER — LORAZEPAM 2 MG/ML IJ SOLN: 0.5000 mg | Freq: Once | INTRAMUSCULAR | Status: DC

## 2018-05-06 NOTE — ED Notes (Signed)
Discharge instructions discussed by [place] RN. Pt verbalized understanding. Pt stable and ambulatory.

## 2018-05-06 NOTE — ED Notes (Signed)
Called vascular to inquire about when pt would be taken for scan; per vascular it "should be shortly"

## 2018-05-06 NOTE — ED Provider Notes (Signed)
[facility] EMERGENCY DEPARTMENT Provider Note   CSN: [id number] Arrival date & time: 05/06/18  1327     History   Chief Complaint Chief Complaint  Patient presents with  . Neck Pain  . Chest Pain    HPI Avary T Sharpley is a 72 y.o. female.  Patient states she had acute onset of right-sided neck and trapezius pain this morning.  She said it felt like there was a crick in her neck.  It is resolved now and she can feel little soreness if she turns her head a specific direction.  She also has been having on and off throbbing pain under her left breast.  Michela Pitcher it would last for a minute or 2 at a time and go away.  this is new for her.  She experienced a similar pain in her left leg.  She said her left leg is been swollen for a few years and they did a duplex ultrasound on it a year ago and it did not show a DVT.  There is been some chronic numbness in her left foot.  She is supposed to be on a blood pressure medicine but has not been taking it for about 2 weeks because of insurance difficulties.  There is been no trauma no other medical complaints.  No illness symptoms including cough fevers abdominal pain nausea vomiting diarrhea or urinary symptoms.  Her main concern is that she may have a blood clot.  The history is provided by the patient.  Chest Pain   This is a new problem. The current episode started 3 to 5 hours ago. The problem occurs hourly. The problem has not changed since onset.The pain is present in the lateral region. The pain is moderate. The quality of the pain is described as dull. The pain does not radiate. Associated symptoms include leg pain, lower extremity edema and numbness. Pertinent negatives include no abdominal pain, no back pain, no cough, no diaphoresis, no dizziness, no fever, no headaches, no nausea, no shortness of breath, no sputum production, no syncope and no vomiting. She has tried nothing for the symptoms. The treatment provided no relief.  Her  past medical history is significant for CAD.  Procedure history is positive for cardiac catheterization and stress thallium.    Past Medical History:  Diagnosis Date  . Anemia   . CAD (coronary artery disease)    no cardiologist, followed by PCP only  . Cataract   . Diverticulosis   . Generalized headaches   . GERD (gastroesophageal reflux disease)   . Glaucoma   . Hemorrhoids   . Hiatal hernia    On CT done Jan 31, 2011   . Hx of colonic polyps   . Hypertension   . Hyperthyroidism   . Osteoarthritis   . Uterine polyp   . Vitamin D deficiency     Patient Active Problem List   Diagnosis Date Noted  . RLQ abdominal pain 11/14/2017  . Nausea 11/14/2017  . Edema 02/28/2017  . Acute upper respiratory infection 05/07/2016  . Cough 05/07/2016  . 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 Fatigue 11/02/2015  . Hyperglycemia 03/24/2015  . Colon polyp, hyperplastic 11/27/2012  . Low back pain 08/03/2012  . Foot pain, left 04/14/2012  . Callus of foot 04/14/2012  . Leg pain, left 07/09/2011  . Diastolic dysfunction 67/61/9509  . Hypokalemia 05/15/2011  . Thyrotoxicosis 03/26/2010  . RECTAL BLEEDING 03/26/2010  . HEMORRHOIDS 03/23/2010  . Anemia due to chronic  blood loss 03/23/2010  . HERPES ZOSTER 11/09/2009  . Pain in limb 10/04/2009  . Headache(784.0) 10/04/2009  . TINEA PEDIS 06/20/2009  . ALLERGIC RHINITIS 12/15/2008  . Chest pain, atypical 11/08/2008  . CERVICAL RADICULOPATHY, RIGHT 03/25/2008  . KNEE PAIN, ACUTE 01/18/2008  . Vitamin D deficiency 11/21/2007  . ABNORMAL CHEST XRAY 11/21/2007  . Essential hypertension 11/18/2007  . Coronary atherosclerosis 11/18/2007  . GERD 11/18/2007  . DIVERTICULOSIS, COLON 11/18/2007  . OSTEOARTHRITIS 11/18/2007  . PARESTHESIA 11/18/2007    Past Surgical History:  Procedure Laterality Date  . ABDOMINAL HYSTERECTOMY N/A 04/26/2013   Procedure: HYSTERECTOMY ABDOMINAL;  Surgeon: Cyril Mourning, MD;  Location: [facility];  Service:  Gynecology;  Laterality: N/A;  . DILATION AND CURETTAGE OF UTERUS  01/18/2013   with uterine polypectomy  . SALPINGOOPHORECTOMY Bilateral 04/26/2013   Procedure: SALPINGO OOPHORECTOMY;  Surgeon: Cyril Mourning, MD;  Location: [facility];  Service: Gynecology;  Laterality: Bilateral;     OB History   None      Home Medications    Prior to Admission medications   Medication Sig Start Date End Date Taking? Authorizing Provider  amitriptyline (ELAVIL) 25 MG tablet Take 1 tablet (25 mg total) by mouth daily as needed. Due for routine OV 11/21/17   Plotnikov, Evie Lacks, MD  Azilsartan-Chlorthalidone (EDARBYCLOR) 40-25 MG TABS Take 1 tablet by mouth every morning. Overdue for appt must see provider for future refills 04/20/18   Plotnikov, Evie Lacks, MD  brimonidine-timolol (COMBIGAN) 0.2-0.5 % ophthalmic solution Place 1 drop into both eyes 2 (two) times daily.    [provider]  carvedilol (COREG) 25 MG tablet Take 1 tablet (25 mg total) by mouth 2 (two) times daily. 04/10/17 04/10/18  Plotnikov, Evie Lacks, MD  cetirizine (ZYRTEC) 10 MG tablet Take 1 tablet (10 mg total) by mouth daily. 04/23/16   Jaynee Eagles, PA-C  cholecalciferol (VITAMIN D) 1000 UNITS tablet Take 1,000 Units by mouth every other day.     [provider]  cloNIDine (CATAPRES) 0.1 MG tablet Take 1 tablet (0.1 mg total) by mouth 2 (two) times daily as needed. Take if BP>170/110 04/10/17   Plotnikov, Evie Lacks, MD  Ferrous Sulfate (IRON) 325 (65 FE) MG TABS Take 1 tablet by mouth 3 (three) times a week.     [provider]  fluticasone furoate-vilanterol (BREO ELLIPTA) 100-25 MCG/INH AEPB Inhale 1 puff into the lungs daily. 05/07/16   Plotnikov, Evie Lacks, MD  hydrocortisone (PROCTOCORT) 1 % CREA Apply 1 application topically 2 (two) times daily as needed (dry skin).     [provider]  ibuprofen (ADVIL,MOTRIN) 600 MG tablet TAKE TWICE A DAY X 2 WEEKS, THEN PRN PAIN 04/19/18   Plotnikov, Evie Lacks, MD    triamcinolone cream (KENALOG) 0.5 % Apply topically 2 (two) times daily as needed. 01/22/16   Plotnikov, Evie Lacks, MD    Family History Family History  Problem Relation Age of Onset  . Hypertension Mother   . Hypertension Father   . Heart failure Father   . Deep vein thrombosis Son 35       x 2, chronic coumadin  . Hypertension Sister   . CVA Brother   . Diabetes Brother   . Hypertension Brother   . Colon cancer Neg Hx     Social History Social History   Tobacco Use  . Smoking status: Never Smoker  . Smokeless tobacco: Never Used  Substance Use Topics  . Alcohol use: No  Alcohol/week: 0.0 oz  . Drug use: No     Allergies   Amlodipine   Review of Systems Review of Systems  Constitutional: Negative for diaphoresis and fever.  HENT: Negative for sore throat.   Eyes: Negative for visual disturbance.  Respiratory: Negative for cough, sputum production and shortness of breath.   Cardiovascular: Positive for chest pain. Negative for syncope.  Gastrointestinal: Negative for abdominal pain, nausea and vomiting.  Genitourinary: Negative for dysuria.  Musculoskeletal: Negative for back pain.  Skin: Negative for rash.  Neurological: Positive for numbness. Negative for dizziness and headaches.     Physical Exam Updated Vital Signs BP (!) 179/104 (BP Location: Right Arm)   Pulse (!) 51   Temp 98.2 F (36.8 C)   Resp 14   Ht 5\' 4"  (1.626 m)   Wt 73.5 kg (162 lb)   SpO2 100%   BMI 27.81 kg/m   Physical Exam  Constitutional: She appears well-developed and well-nourished. No distress.  HENT:  Head: Normocephalic and atraumatic.  Eyes: Conjunctivae are normal.  Neck: Neck supple.  Cardiovascular: Normal rate, regular rhythm and normal pulses.  No murmur heard. Pulmonary/Chest: Effort normal and breath sounds normal. No respiratory distress.  Abdominal: Soft. There is no tenderness.  Musculoskeletal:       Right lower leg: She exhibits no tenderness.        Left lower leg: She exhibits edema. She exhibits no tenderness.  Neurological: She is alert.  Skin: Skin is warm and dry. Capillary refill takes less than 2 seconds.  Psychiatric: She has a normal mood and affect.  Nursing note and vitals reviewed.    ED Treatments / Results  Labs (all labs ordered are listed, but only abnormal results are displayed) Labs Reviewed  CBC - Abnormal; Notable for the following components:      Result Value   WBC 2.7 (*)    RBC 3.27 (*)    Hemoglobin 10.8 (*)    HCT 33.2 (*)    MCV 101.5 (*)    All other components within normal limits  BASIC METABOLIC PANEL  I-STAT TROPONIN, ED    EKG EKG Interpretation  Date/Time:  Wednesday May 06 2018 13:34:04 EDT Ventricular Rate:  53 PR Interval:    QRS Duration: 98 QT Interval:  487 QTC Calculation: 458 R Axis:   21 Text Interpretation:  Sinus rhythm Abnormal R-wave progression, early transition Baseline wander in lead(s) V6 similar to prior 7/18 Confirmed by Aletta Edouard [phone]) on 05/06/2018 2:07:38 PM   Radiology Dg Chest 2 View  Result Date: 05/06/2018 CLINICAL DATA:  72 year old female with left lower chest pain and right neck pain. Clinical history includes CHF. EXAM: CHEST - 2 VIEW COMPARISON:  Prior CT scan of the chest 01/19/2017; prior chest x-ray 04/23/2016 FINDINGS: Cardiomegaly. The mediastinal contours are within normal limits. No evidence of pulmonary edema, or focal airspace consolidation. There are small bilateral pleural effusions noted on the lateral view. Mild chronic bronchitic changes are similar compared to prior. No acute osseous abnormality. The visualized upper abdominal bowel gas pattern is normal. IMPRESSION: 1. Cardiomegaly without evidence of pulmonary edema. 2. Small bilateral layering pleural effusions. Electronically Signed   By: Jacqulynn Cadet M.D.   On: 05/06/2018 14:06    Procedures Procedures (including critical care time)  Medications Ordered in  ED Medications - No data to display   Initial Impression / Assessment and Plan / ED Course  I have reviewed the triage vital signs and the nursing  notes.  Pertinent labs & imaging results that were available during my care of the patient were reviewed by me and considered in my medical decision making (see chart for details).  Clinical Course as of May 07 932  Wed May 06, 2018  1423 5/18 - nuclear study -  Nuclear stress EF: 64%. Normal wall motion  Downsloping ST segment depression ST segment depression of 2 mm was noted during stress in the II, III, aVF and V5 leads.  This is a low risk perfusion study. Normal perfusion pattern would suggest false positive exercise treadmill test results. There was no transient ischemic dilatation.  Similar findings were noted on prior nuclear stress test 2015.   [MB]  0488 Patient's work-up here is been fairly unremarkable.  Her sats have been in the 100% on room air not tachypneic.  She has had a duplex of her left lower leg which is more swollen than normal which is negative for DVT.  The gets unlikely that she is thrown a PE with the studies being normal.  She is comfortable with going home and following up with her doctor.   [MB]    Clinical Course User Index [MB] Hayden Rasmussen, MD     Final Clinical Impressions(s) / ED Diagnoses   Final diagnoses:  Neck pain  Atypical chest pain    ED Discharge Orders    None       Hayden Rasmussen, MD 05/07/18 [phone]

## 2018-05-06 NOTE — Progress Notes (Signed)
Left lower extremity venous duplex completed. Preliminary results. There is no evidence of a DVT or Baker's cyst. [place] Evanell Redlich,RVS  05/06/2018 4:38 PM

## 2018-05-06 NOTE — Progress Notes (Deleted)
Bilateral lower extremity vein mapping completed.

## 2018-05-06 NOTE — ED Triage Notes (Signed)
Pt from home via EMS; pt sitting in chair around  when sudden onset of R sided neck pain occurred, accompanied shortly after by L sided CP (under L breast); pt descibed CP as "throbbing" and neck pain as "sore"; pt denies SOB/ N/V/ or diaphoresis; CP resolved prior to EMS arrival; Hx CHF, HTN; pt hypertensive, takes Carvedilol, took 1 this am; pt prescribed 2nd BP med, but is not taking it at this time d/t problems with med being covered by insurance  BP 182/108 HR 56 98% RA RR 16 CBG 134

## 2018-05-06 NOTE — ED Notes (Signed)
MD aware of pt's continued HTN 

## 2018-05-06 NOTE — Discharge Instructions (Addendum)
You were seen in the emergency department for neck pain chest pain and leg swelling.  You had blood work EKG chest x-ray and ultrasound of your leg that did not show an obvious cause of your symptoms.  Your blood pressure was also elevated here and you will need to follow-up with your doctor for continued management of this.  Please return if any worsening symptoms.

## 2018-05-06 NOTE — ED Notes (Signed)
Patient transported to X-ray 

## 2018-05-07 NOTE — Telephone Encounter (Signed)
PA started

## 2018-05-08 ENCOUNTER — Ambulatory Visit: Payer: BLUE CROSS/BLUE SHIELD | Admitting: Internal Medicine

## 2018-05-08 ENCOUNTER — Encounter: Payer: Self-pay | Admitting: Internal Medicine

## 2018-05-08 ENCOUNTER — Other Ambulatory Visit: Payer: Self-pay | Admitting: Internal Medicine

## 2018-05-08 ENCOUNTER — Other Ambulatory Visit (INDEPENDENT_AMBULATORY_CARE_PROVIDER_SITE_OTHER): Payer: BLUE CROSS/BLUE SHIELD

## 2018-05-08 VITALS — BP 136/88 | HR 73 | Ht 64.0 in | Wt 173.0 lb

## 2018-05-08 DIAGNOSIS — R5382 Chronic fatigue, unspecified: Secondary | ICD-10-CM

## 2018-05-08 DIAGNOSIS — R739 Hyperglycemia, unspecified: Secondary | ICD-10-CM

## 2018-05-08 DIAGNOSIS — I10 Essential (primary) hypertension: Secondary | ICD-10-CM

## 2018-05-08 DIAGNOSIS — R0789 Other chest pain: Secondary | ICD-10-CM | POA: Diagnosis not present

## 2018-05-08 DIAGNOSIS — R0600 Dyspnea, unspecified: Secondary | ICD-10-CM

## 2018-05-08 DIAGNOSIS — I2510 Atherosclerotic heart disease of native coronary artery without angina pectoris: Secondary | ICD-10-CM

## 2018-05-08 DIAGNOSIS — E559 Vitamin D deficiency, unspecified: Secondary | ICD-10-CM

## 2018-05-08 DIAGNOSIS — M542 Cervicalgia: Secondary | ICD-10-CM | POA: Insufficient documentation

## 2018-05-08 DIAGNOSIS — J90 Pleural effusion, not elsewhere classified: Secondary | ICD-10-CM

## 2018-05-08 LAB — BASIC METABOLIC PANEL
BUN: 15 mg/dL (ref 6–23)
CHLORIDE: 106 meq/L (ref 96–112)
CO2: 30 meq/L (ref 19–32)
Calcium: 9.3 mg/dL (ref 8.4–10.5)
Creatinine, Ser: 0.91 mg/dL (ref 0.40–1.20)
GFR: 78.17 mL/min (ref >60.00)
Glucose, Bld: 104 mg/dL — ABNORMAL HIGH (ref 70–99)
POTASSIUM: 3.8 meq/L (ref 3.5–5.1)
SODIUM: 139 meq/L (ref 135–145)

## 2018-05-08 LAB — BRAIN NATRIURETIC PEPTIDE: PRO B NATRI PEPTIDE: 55 pg/mL (ref 0.0–100.0)

## 2018-05-08 MED ORDER — MELOXICAM 15 MG PO TABS: 15.0000 mg | ORAL_TABLET | Freq: Every day | ORAL | 0 refills | Status: DC

## 2018-05-08 MED ORDER — POTASSIUM CHLORIDE ER 10 MEQ PO TBCR: 10.0000 meq | EXTENDED_RELEASE_TABLET | Freq: Every day | ORAL | 11 refills | Status: DC

## 2018-05-08 MED ORDER — FLUTICASONE FUROATE-VILANTEROL 100-25 MCG/INH IN AEPB: 1.0000 | INHALATION_SPRAY | Freq: Every day | RESPIRATORY_TRACT | 5 refills | Status: DC

## 2018-05-08 MED ORDER — CARVEDILOL 25 MG PO TABS: 25.0000 mg | ORAL_TABLET | Freq: Two times a day (BID) | ORAL | 11 refills | Status: DC

## 2018-05-08 MED ORDER — AZILSARTAN-CHLORTHALIDONE 40-25 MG PO TABS: 1.0000 | ORAL_TABLET | Freq: Every morning | ORAL | 11 refills | Status: DC

## 2018-05-08 NOTE — Assessment & Plan Note (Signed)
Labs

## 2018-05-08 NOTE — Progress Notes (Signed)
Subjective:  Patient ID: Kelly Santos, female    DOB: 01/09/1946  Age: 72 y.o. MRN: [id number]  CC: No chief complaint on file.   HPI Kelly Santos presents for HTN f/u F/u ER visit for neck pain it is better. She had a L CP - resolving. C/o pleuritic pain. Out of BP meds x 3 weeks.... CL stress test was (-) in 2018. Chest CT 2018 C/o R CP this am CXR on 8/7 w/B pleural effusion  Outpatient Medications Prior to Visit  Medication Sig Dispense Refill  . amitriptyline (ELAVIL) 25 MG tablet Take 1 tablet (25 mg total) by mouth daily as needed. Due for routine OV 30 tablet 1  . Azilsartan-Chlorthalidone (EDARBYCLOR) 40-25 MG TABS Take 1 tablet by mouth every morning. Overdue for appt must see provider for future refills 30 tablet 0  . brimonidine-timolol (COMBIGAN) 0.2-0.5 % ophthalmic solution Place 1 drop into both eyes 2 (two) times daily.    . cetirizine (ZYRTEC) 10 MG tablet Take 1 tablet (10 mg total) by mouth daily. 30 tablet 11  . cholecalciferol (VITAMIN D) 1000 UNITS tablet Take 1,000 Units by mouth every other day.     . cloNIDine (CATAPRES) 0.1 MG tablet Take 1 tablet (0.1 mg total) by mouth 2 (two) times daily as needed. Take if BP>170/110 60 tablet 1  . Ferrous Sulfate (IRON) 325 (65 FE) MG TABS Take 1 tablet by mouth 3 (three) times a week.     . fluticasone furoate-vilanterol (BREO ELLIPTA) 100-25 MCG/INH AEPB Inhale 1 puff into the lungs daily. 1 each 5  . hydrocortisone (PROCTOCORT) 1 % CREA Apply 1 application topically 2 (two) times daily as needed (dry skin).     Marland Kitchen ibuprofen (ADVIL,MOTRIN) 600 MG tablet TAKE TWICE A DAY X 2 WEEKS, THEN PRN PAIN 60 tablet 3  . triamcinolone cream (KENALOG) 0.5 % Apply topically 2 (two) times daily as needed. 30 g 2  . carvedilol (COREG) 25 MG tablet Take 1 tablet (25 mg total) by mouth 2 (two) times daily. 60 tablet 11   No facility-administered medications prior to visit.     ROS: Review of Systems  Constitutional: Negative for  activity change, appetite change, chills, fatigue and unexpected weight change.  HENT: Negative for congestion, mouth sores and sinus pressure.   Eyes: Negative for visual disturbance.  Respiratory: Negative for cough and chest tightness.   Gastrointestinal: Negative for abdominal pain and nausea.  Genitourinary: Negative for difficulty urinating, frequency and vaginal pain.  Musculoskeletal: Positive for neck pain. Negative for back pain and gait problem.  Skin: Negative for pallor and rash.  Neurological: Negative for dizziness, tremors, weakness, numbness and headaches.  Psychiatric/Behavioral: Negative for confusion and sleep disturbance.    Objective:  Ht 5\' 4"  (1.626 m)   Wt 173 lb (78.5 kg)   BMI 29.70 kg/m   BP Readings from Last 3 Encounters:  05/06/18 (!) 192/94  11/14/17 126/84  04/10/17 118/64    Wt Readings from Last 3 Encounters:  05/08/18 173 lb (78.5 kg)  05/06/18 162 lb (73.5 kg)  11/14/17 175 lb (79.4 kg)    Physical Exam  Constitutional: She appears well-developed. No distress.  HENT:  Head: Normocephalic.  Right Ear: External ear normal.  Left Ear: External ear normal.  Nose: Nose normal.  Mouth/Throat: Oropharynx is clear and moist.  Eyes: Pupils are equal, round, and reactive to light. Conjunctivae are normal. Right eye exhibits no discharge. Left eye exhibits no discharge.  Neck:  Normal range of motion. Neck supple. No JVD present. No tracheal deviation present. No thyromegaly present.  Cardiovascular: Normal rate, regular rhythm and normal heart sounds.  Pulmonary/Chest: No stridor. No respiratory distress. She has no wheezes.  Abdominal: Soft. Bowel sounds are normal. She exhibits no distension and no mass. There is no tenderness. There is no rebound and no guarding.  Musculoskeletal: She exhibits tenderness. She exhibits no edema.  Lymphadenopathy:    She has no cervical adenopathy.  Neurological: She displays normal reflexes. No cranial nerve  deficit. She exhibits normal muscle tone. Coordination normal.  Skin: No rash noted. No erythema.  Psychiatric: She has a normal mood and affect. Her behavior is normal. Judgment and thought content normal.    Lab Results  Component Value Date   WBC 2.7 (L) 05/06/2018   HGB 10.8 (L) 05/06/2018   HCT 33.2 (L) 05/06/2018   PLT 184 05/06/2018   GLUCOSE 97 05/06/2018   CHOL 156 01/23/2017   TRIG 83 01/23/2017   HDL 55 01/23/2017   LDLCALC 84 01/23/2017   ALT 16 11/14/2017   AST 17 11/14/2017   NA 139 05/06/2018   K 3.4 (L) 05/06/2018   CL 108 05/06/2018   CREATININE 0.73 05/06/2018   BUN 8 05/06/2018   CO2 25 05/06/2018   TSH 0.62 01/14/2017   INR 0.96 05/09/2011   HGBA1C 5.8 (H) 01/23/2017    Dg Chest 2 View  Result Date: 05/06/2018 CLINICAL DATA:  72 year old female with left lower chest pain and right neck pain. Clinical history includes CHF. EXAM: CHEST - 2 VIEW COMPARISON:  Prior CT scan of the chest 01/19/2017; prior chest x-ray 04/23/2016 FINDINGS: Cardiomegaly. The mediastinal contours are within normal limits. No evidence of pulmonary edema, or focal airspace consolidation. There are small bilateral pleural effusions noted on the lateral view. Mild chronic bronchitic changes are similar compared to prior. No acute osseous abnormality. The visualized upper abdominal bowel gas pattern is normal. IMPRESSION: 1. Cardiomegaly without evidence of pulmonary edema. 2. Small bilateral layering pleural effusions. Electronically Signed   By: Jacqulynn Cadet M.D.   On: 05/06/2018 14:06    Assessment & Plan:   There are no diagnoses linked to this encounter.   No orders of the defined types were placed in this encounter.    Follow-up: No follow-ups on file.  Walker Kehr, MD

## 2018-05-08 NOTE — Assessment & Plan Note (Signed)
C/o pleuritic pain. Out of BP meds x 3 weeks.... CL stress test was (-) in 2018. Chest CT 2018 C/o R CP this am CXR on 8/7 w/B pleural effusion  Re-start BP meds. RTC 2 weeks NSAIDs Breo

## 2018-05-08 NOTE — Assessment & Plan Note (Addendum)
R MSK Better

## 2018-05-08 NOTE — Telephone Encounter (Signed)
PA is approved 05/07/18-05/08/19 per [employer].

## 2018-05-08 NOTE — Assessment & Plan Note (Signed)
ASA

## 2018-05-08 NOTE — Assessment & Plan Note (Signed)
small B ?etiology BNP, BMET Meloxicam Re-start BP meds Previous ECHO 2017, CL 2018, chest CT 2018 reviewed RTC 2 wks

## 2018-05-08 NOTE — Assessment & Plan Note (Signed)
Vit D 

## 2018-05-08 NOTE — Assessment & Plan Note (Addendum)
[employer], Information systems manager

## 2018-05-13 ENCOUNTER — Other Ambulatory Visit: Payer: Self-pay | Admitting: *Deleted

## 2018-05-13 MED ORDER — BRIMONIDINE TARTRATE-TIMOLOL 0.2-0.5 % OP SOLN: 1.0000 [drp] | Freq: Two times a day (BID) | OPHTHALMIC | 0 refills | Status: DC

## 2018-05-22 ENCOUNTER — Other Ambulatory Visit (INDEPENDENT_AMBULATORY_CARE_PROVIDER_SITE_OTHER): Payer: BLUE CROSS/BLUE SHIELD

## 2018-05-22 ENCOUNTER — Encounter: Payer: Self-pay | Admitting: Internal Medicine

## 2018-05-22 ENCOUNTER — Ambulatory Visit: Payer: BLUE CROSS/BLUE SHIELD | Admitting: Internal Medicine

## 2018-05-22 VITALS — BP 128/78 | HR 57 | Temp 98.1°F | Ht 64.0 in | Wt 173.0 lb

## 2018-05-22 DIAGNOSIS — Z0000 Encounter for general adult medical examination without abnormal findings: Secondary | ICD-10-CM | POA: Diagnosis not present

## 2018-05-22 DIAGNOSIS — E559 Vitamin D deficiency, unspecified: Secondary | ICD-10-CM

## 2018-05-22 DIAGNOSIS — R5382 Chronic fatigue, unspecified: Secondary | ICD-10-CM

## 2018-05-22 DIAGNOSIS — R0789 Other chest pain: Secondary | ICD-10-CM

## 2018-05-22 DIAGNOSIS — K121 Other forms of stomatitis: Secondary | ICD-10-CM | POA: Insufficient documentation

## 2018-05-22 LAB — CBC WITH DIFFERENTIAL/PLATELET
BASOS PCT: 1.1 % (ref 0.0–3.0)
Basophils Absolute: 0 10*3/uL (ref 0.0–0.1)
EOS ABS: 0.2 10*3/uL (ref 0.0–0.7)
Eosinophils Relative: 5.8 % — ABNORMAL HIGH (ref 0.0–5.0)
HCT: 33.8 % — ABNORMAL LOW (ref 36.0–46.0)
HEMOGLOBIN: 11.4 g/dL — AB (ref 12.0–15.0)
LYMPHS ABS: 0.9 10*3/uL (ref 0.7–4.0)
Lymphocytes Relative: 26.2 % (ref 12.0–46.0)
MCHC: 33.8 g/dL (ref 30.0–36.0)
MCV: 99.2 fl (ref 78.0–100.0)
MONO ABS: 0.4 10*3/uL (ref 0.1–1.0)
Monocytes Relative: 10.3 % (ref 3.0–12.0)
NEUTROS PCT: 56.6 % (ref 43.0–77.0)
Neutro Abs: 2 10*3/uL (ref 1.4–7.7)
PLATELETS: 193 10*3/uL (ref 150.0–400.0)
RBC: 3.41 Mil/uL — ABNORMAL LOW (ref 3.87–5.11)
RDW: 12.9 % (ref 11.5–15.5)
WBC: 3.4 10*3/uL — AB (ref 4.0–10.5)

## 2018-05-22 LAB — BASIC METABOLIC PANEL
BUN: 19 mg/dL (ref 6–23)
CHLORIDE: 102 meq/L (ref 96–112)
CO2: 31 mEq/L (ref 19–32)
Calcium: 9.5 mg/dL (ref 8.4–10.5)
Creatinine, Ser: 0.95 mg/dL (ref 0.40–1.20)
GFR: 74.37 mL/min (ref >60.00)
Glucose, Bld: 113 mg/dL — ABNORMAL HIGH (ref 70–99)
POTASSIUM: 3.3 meq/L — AB (ref 3.5–5.1)
Sodium: 136 mEq/L (ref 135–145)

## 2018-05-22 LAB — LIPID PANEL
CHOLESTEROL: 151 mg/dL (ref 0–200)
HDL: 48 mg/dL (ref >39.00)
LDL CALC: 80 mg/dL (ref 0–99)
NonHDL: 102.61
Total CHOL/HDL Ratio: 3
Triglycerides: 112 mg/dL (ref 0.0–149.0)
VLDL: 22.4 mg/dL (ref 0.0–40.0)

## 2018-05-22 LAB — HEPATIC FUNCTION PANEL
ALT: 18 U/L (ref 0–35)
AST: 19 U/L (ref 0–37)
Albumin: 3.8 g/dL (ref 3.5–5.2)
Alkaline Phosphatase: 42 U/L (ref 39–117)
BILIRUBIN DIRECT: 0.1 mg/dL (ref 0.0–0.3)
BILIRUBIN TOTAL: 0.4 mg/dL (ref 0.2–1.2)
TOTAL PROTEIN: 7.4 g/dL (ref 6.0–8.3)

## 2018-05-22 LAB — TSH: TSH: 0.47 u[IU]/mL (ref 0.35–4.50)

## 2018-05-22 MED ORDER — VALACYCLOVIR HCL 500 MG PO TABS
500.0000 mg | ORAL_TABLET | Freq: Two times a day (BID) | ORAL | 1 refills | Status: DC
Start: 2018-05-22 — End: 2020-09-01

## 2018-05-22 MED ORDER — TRIAMCINOLONE ACETONIDE 0.1 % MT PSTE: 1.0000 "application " | PASTE | Freq: Four times a day (QID) | OROMUCOSAL | 1 refills | Status: DC

## 2018-05-22 MED ORDER — AZILSARTAN-CHLORTHALIDONE 40-25 MG PO TABS: 1.0000 | ORAL_TABLET | Freq: Every morning | ORAL | 11 refills | Status: DC

## 2018-05-22 NOTE — Progress Notes (Signed)
Subjective:  Patient ID: Kelly Santos, female    DOB: 25-Mar-1946  Age: 72 y.o. MRN: [id number]  CC: No chief complaint on file.   HPI Kelly Santos presents for a well exam C/o mouth sores  Outpatient Medications Prior to Visit  Medication Sig Dispense Refill  . amitriptyline (ELAVIL) 25 MG tablet Take 1 tablet (25 mg total) by mouth daily as needed. Due for routine OV 30 tablet 1  . Azilsartan-Chlorthalidone (EDARBYCLOR) 40-25 MG TABS Take 1 tablet by mouth every morning. 30 tablet 11  . brimonidine-timolol (COMBIGAN) 0.2-0.5 % ophthalmic solution Place 1 drop into both eyes 2 (two) times daily. 15 mL 0  . carvedilol (COREG) 25 MG tablet Take 1 tablet (25 mg total) by mouth 2 (two) times daily. 60 tablet 11  . cetirizine (ZYRTEC) 10 MG tablet Take 1 tablet (10 mg total) by mouth daily. 30 tablet 11  . cholecalciferol (VITAMIN D) 1000 UNITS tablet Take 1,000 Units by mouth every other day.     . cloNIDine (CATAPRES) 0.1 MG tablet Take 1 tablet (0.1 mg total) by mouth 2 (two) times daily as needed. Take if BP>170/110 60 tablet 1  . Ferrous Sulfate (IRON) 325 (65 FE) MG TABS Take 1 tablet by mouth 3 (three) times a week.     . fluticasone furoate-vilanterol (BREO ELLIPTA) 100-25 MCG/INH AEPB Inhale 1 puff into the lungs daily. 1 each 5  . hydrocortisone (PROCTOCORT) 1 % CREA Apply 1 application topically 2 (two) times daily as needed (dry skin).     Marland Kitchen ibuprofen (ADVIL,MOTRIN) 600 MG tablet TAKE TWICE A DAY X 2 WEEKS, THEN PRN PAIN 60 tablet 3  . meloxicam (MOBIC) 15 MG tablet Take 1 tablet (15 mg total) by mouth daily. 30 tablet 0  . potassium chloride (K-DUR) 10 MEQ tablet Take 1 tablet (10 mEq total) by mouth daily. 30 tablet 11  . triamcinolone cream (KENALOG) 0.5 % Apply topically 2 (two) times daily as needed. 30 g 2   No facility-administered medications prior to visit.     ROS: Review of Systems  Constitutional: Negative for activity change, appetite change, chills, fatigue and  unexpected weight change.  HENT: Negative for congestion, mouth sores and sinus pressure.   Eyes: Negative for visual disturbance.  Respiratory: Negative for cough and chest tightness.   Gastrointestinal: Negative for abdominal pain and nausea.  Genitourinary: Negative for difficulty urinating, frequency and vaginal pain.  Musculoskeletal: Negative for back pain and gait problem.  Skin: Negative for pallor and rash.  Neurological: Negative for dizziness, tremors, weakness, numbness and headaches.  Psychiatric/Behavioral: Negative for confusion, sleep disturbance and suicidal ideas.    Objective:  BP 128/78 (BP Location: Left Arm, Patient Position: Sitting, Cuff Size: Normal)   Pulse (!) 57   Temp 98.1 F (36.7 C) (Oral)   Ht 5\' 4"  (1.626 m)   Wt 173 lb (78.5 kg)   SpO2 97%   BMI 29.70 kg/m   BP Readings from Last 3 Encounters:  05/22/18 128/78  05/08/18 136/88  05/06/18 (!) 192/94    Wt Readings from Last 3 Encounters:  05/22/18 173 lb (78.5 kg)  05/08/18 173 lb (78.5 kg)  05/06/18 162 lb (73.5 kg)    Physical Exam  Constitutional: She appears well-developed. No distress.  HENT:  Head: Normocephalic.  Right Ear: External ear normal.  Left Ear: External ear normal.  Nose: Nose normal.  Mouth/Throat: Oropharynx is clear and moist.  Eyes: Pupils are equal, round, and reactive  to light. Conjunctivae are normal. Right eye exhibits no discharge. Left eye exhibits no discharge.  Neck: Normal range of motion. Neck supple. No JVD present. No tracheal deviation present. No thyromegaly present.  Cardiovascular: Normal rate, regular rhythm and normal heart sounds.  Pulmonary/Chest: No stridor. No respiratory distress. She has no wheezes.  Abdominal: Soft. Bowel sounds are normal. She exhibits no distension and no mass. There is no tenderness. There is no rebound and no guarding.  Musculoskeletal: She exhibits no edema or tenderness.  Lymphadenopathy:    She has no cervical  adenopathy.  Neurological: She displays normal reflexes. No cranial nerve deficit. She exhibits normal muscle tone. Coordination normal.  Skin: No rash noted. No erythema.  Psychiatric: She has a normal mood and affect. Her behavior is normal. Judgment and thought content normal.  2 ulcers on tongue  Lab Results  Component Value Date   WBC 2.7 (L) 05/06/2018   HGB 10.8 (L) 05/06/2018   HCT 33.2 (L) 05/06/2018   PLT 184 05/06/2018   GLUCOSE 104 (H) 05/08/2018   CHOL 156 01/23/2017   TRIG 83 01/23/2017   HDL 55 01/23/2017   LDLCALC 84 01/23/2017   ALT 16 11/14/2017   AST 17 11/14/2017   NA 139 05/08/2018   K 3.8 05/08/2018   CL 106 05/08/2018   CREATININE 0.91 05/08/2018   BUN 15 05/08/2018   CO2 30 05/08/2018   TSH 0.62 01/14/2017   INR 0.96 05/09/2011   HGBA1C 5.8 (H) 01/23/2017    Dg Chest 2 View  Result Date: 05/06/2018 CLINICAL DATA:  72 year old female with left lower chest pain and right neck pain. Clinical history includes CHF. EXAM: CHEST - 2 VIEW COMPARISON:  Prior CT scan of the chest 01/19/2017; prior chest x-ray 04/23/2016 FINDINGS: Cardiomegaly. The mediastinal contours are within normal limits. No evidence of pulmonary edema, or focal airspace consolidation. There are small bilateral pleural effusions noted on the lateral view. Mild chronic bronchitic changes are similar compared to prior. No acute osseous abnormality. The visualized upper abdominal bowel gas pattern is normal. IMPRESSION: 1. Cardiomegaly without evidence of pulmonary edema. 2. Small bilateral layering pleural effusions. Electronically Signed   By: Jacqulynn Cadet M.D.   On: 05/06/2018 14:06    Assessment & Plan:   There are no diagnoses linked to this encounter.   No orders of the defined types were placed in this encounter.    Follow-up: No follow-ups on file.  Walker Kehr, MD

## 2018-05-22 NOTE — Assessment & Plan Note (Addendum)
The patient is here for a wellness exam. The patient has been doing well overall without major physical or psychological issues going on lately. The patient needs to address  chronic hypertension that has been well controlled with medicines; to address chronic  hyperlipidemia controlled with medicines as well; and to address type 2 chronic diabetes, controlled with medical treatment and diet. Flu shot, shingrix adviced

## 2018-05-22 NOTE — Patient Instructions (Signed)
Use [employer] Peroxicare tooth paste  

## 2018-05-22 NOTE — Assessment & Plan Note (Signed)
Resolved

## 2018-05-22 NOTE — Assessment & Plan Note (Signed)
Valtrex Use [employer] Peroxicare tooth paste Kenal/paste oral topical

## 2018-05-22 NOTE — Assessment & Plan Note (Signed)
Vit D 

## 2018-05-24 ENCOUNTER — Other Ambulatory Visit: Payer: Self-pay | Admitting: Internal Medicine

## 2018-05-24 MED ORDER — POTASSIUM CHLORIDE ER 10 MEQ PO TBCR: 10.0000 meq | EXTENDED_RELEASE_TABLET | Freq: Two times a day (BID) | ORAL | 11 refills | Status: DC

## 2018-05-29 ENCOUNTER — Encounter: Payer: Self-pay | Admitting: Internal Medicine

## 2018-07-06 ENCOUNTER — Ambulatory Visit: Payer: Self-pay | Admitting: Internal Medicine

## 2018-07-06 NOTE — Telephone Encounter (Signed)
Pt c/o pain in her "lung area." Pt stated that she is having a "deep soreness" to both sides of her back and to the center of her chest. Pt stated that the pain is similar to how she felt before when she had the "fluid" in her lungs.  Pt stated has a h/o fluid in her lungs and was treated with Breo and Meloxicam for chest wall pain. Pt stated that the pain occurs constantly is mild in severity and is worsened with sitting or standing or when she moves her arms. Pt stated that if she lies flat in her bed, she feels the pain. Pain is relieved when she turns to a different position in the bed. Pt denies any radiation to her jaw or down her arms, shortness of breath, lightheadedness, dizziness or chest tightness. Pt advised to be seen sooner and made her appt tomorrow morning but stated that she cannot make the appointment due to her schedule. Pt asked for an appt later in the day. Next available appt is Wednesday at 2:15 pm with Dr. Quay Burow.  Answer Assessment - Initial Assessment Questions 1. LOCATION: "Where does it hurt?"       Bilateral upper back, then to center to breast bone 2. RADIATION: "Does the pain go anywhere else?" (e.g., into neck, jaw, arms, back)     If moves arm around she can feel it more 3. ONSET: "When did the chest pain begin?" (Minutes, hours or days)      3 days ago 4. PATTERN "Does the pain come and go, or has it been constant since it started?"  "Does it get worse with exertion?"      Constant "deep soreness" 5. DURATION: "How long does it last" (e.g., seconds, minutes, hours)     Worse with movement or with sitting or standing, if she moves her arms you feel it  6. SEVERITY: "How bad is the pain?"  (e.g., Scale 1-10; mild, moderate, or severe)    - MILD (1-3): doesn't interfere with normal activities     - MODERATE (4-7): interferes with normal activities or awakens from sleep    - SEVERE (8-10): excruciating pain, unable to do any normal activities       mild 7. CARDIAC RISK  FACTORS: "Do you have any history of heart problems or risk factors for heart disease?" (e.g., prior heart attack, angina; high blood pressure, diabetes, being overweight, high cholesterol, smoking, or strong family history of heart disease)     Pt CHF, high blood pressure, borderline diabetic, Father with CHF 8. PULMONARY RISK FACTORS: "Do you have any history of lung disease?"  (e.g., blood clots in lung, asthma, emphysema, birth control pills)      Pleural effusion 9. CAUSE: "What do you think is causing the chest pain?"     Fluid build up in lungs 10. OTHER SYMPTOMS: "Do you have any other symptoms?" (e.g., dizziness, nausea, vomiting, sweating, fever, difficulty breathing, cough)       Had 1 episode shortness of breath when laying flat but went away when she turned over 11. PREGNANCY: "Is there any chance you are pregnant?" "When was your last menstrual period?"       n/a  Protocols used: CHEST PAIN-A-AH

## 2018-07-06 NOTE — Telephone Encounter (Signed)
FYI

## 2018-07-07 ENCOUNTER — Ambulatory Visit: Payer: BLUE CROSS/BLUE SHIELD | Admitting: Family

## 2018-07-07 NOTE — Telephone Encounter (Signed)
Noted ER or appt w/Dr Tacey Ruiz

## 2018-07-07 NOTE — Progress Notes (Signed)
Subjective:    Patient ID: Kelly Santos, female    DOB: [birth year], 72 y.o.   MRN: [id number]  HPI The patient is here for an acute visit.  Epigastric/Chest soreness:  She has soreness in her epigastric region and it wraps around her chest to her back.  It started Saturday.  It feels similar to when she had fluid in her lungs.  The soreness is failrly constant.  Ibuprofen helped a little.  Pain is worse with certain movements and deep breaths.   She has been doing all of her usual activites w/o difficulty.  She denies chest pain.    She denies any GERD or nausea.    Medications and allergies reviewed with patient and updated if appropriate.  Patient Active Problem List   Diagnosis Date Noted  . Oral ulcer 05/22/2018  . Neck pain 05/08/2018  . Pleural effusion 05/08/2018  . RLQ abdominal pain 11/14/2017  . Nausea 11/14/2017  . Edema 02/28/2017  . Acute upper respiratory infection 05/07/2016  . Cough 05/07/2016  . 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 Fatigue 11/02/2015  . Hyperglycemia 03/24/2015  . Well adult exam 11/27/2012  . Colon polyp, hyperplastic 11/27/2012  . Low back pain 08/03/2012  . Foot pain, left 04/14/2012  . Callus of foot 04/14/2012  . Leg pain, left 07/09/2011  . Diastolic dysfunction 74/16/3845  . Hypokalemia 05/15/2011  . Thyrotoxicosis 03/26/2010  . RECTAL BLEEDING 03/26/2010  . HEMORRHOIDS 03/23/2010  . Anemia due to chronic blood loss 03/23/2010  . HERPES ZOSTER 11/09/2009  . Pain in limb 10/04/2009  . Headache(784.0) 10/04/2009  . TINEA PEDIS 06/20/2009  . ALLERGIC RHINITIS 12/15/2008  . Chest pain, atypical 11/08/2008  . CERVICAL RADICULOPATHY, RIGHT 03/25/2008  . KNEE PAIN, ACUTE 01/18/2008  . Vitamin D deficiency 11/21/2007  . ABNORMAL CHEST XRAY 11/21/2007  . Essential hypertension 11/18/2007  . Coronary atherosclerosis 11/18/2007  . GERD 11/18/2007  . DIVERTICULOSIS, COLON 11/18/2007  . OSTEOARTHRITIS 11/18/2007  . PARESTHESIA 11/18/2007      Current Outpatient Medications on File Prior to Visit  Medication Sig Dispense Refill  . amitriptyline (ELAVIL) 25 MG tablet Take 1 tablet (25 mg total) by mouth daily as needed. Due for routine OV 30 tablet 1  . Azilsartan-Chlorthalidone (EDARBYCLOR) 40-25 MG TABS Take 1 tablet by mouth every morning. 30 tablet 11  . brimonidine-timolol (COMBIGAN) 0.2-0.5 % ophthalmic solution Place 1 drop into both eyes 2 (two) times daily. 15 mL 0  . carvedilol (COREG) 25 MG tablet Take 1 tablet (25 mg total) by mouth 2 (two) times daily. 60 tablet 11  . cetirizine (ZYRTEC) 10 MG tablet Take 1 tablet (10 mg total) by mouth daily. 30 tablet 11  . cholecalciferol (VITAMIN D) 1000 UNITS tablet Take 1,000 Units by mouth every other day.     . cloNIDine (CATAPRES) 0.1 MG tablet Take 1 tablet (0.1 mg total) by mouth 2 (two) times daily as needed. Take if BP>170/110 60 tablet 1  . Ferrous Sulfate (IRON) 325 (65 FE) MG TABS Take 1 tablet by mouth 3 (three) times a week.     . fluticasone furoate-vilanterol (BREO ELLIPTA) 100-25 MCG/INH AEPB Inhale 1 puff into the lungs daily. 1 each 5  . hydrocortisone (PROCTOCORT) 1 % CREA Apply 1 application topically 2 (two) times daily as needed (dry skin).     Marland Kitchen ibuprofen (ADVIL,MOTRIN) 600 MG tablet TAKE TWICE A DAY X 2 WEEKS, THEN PRN PAIN 60 tablet 3  . meloxicam (MOBIC) 15  MG tablet Take 1 tablet (15 mg total) by mouth daily. 30 tablet 0  . potassium chloride (K-DUR) 10 MEQ tablet Take 1 tablet (10 mEq total) by mouth 2 (two) times daily. 60 tablet 11  . triamcinolone (KENALOG) 0.1 % paste Use as directed 1 application in the mouth or throat 4 (four) times daily. Apply to mouth ulcers 5 g 1  . triamcinolone cream (KENALOG) 0.5 % Apply topically 2 (two) times daily as needed. 30 g 2  . valACYclovir (VALTREX) 500 MG tablet Take 1 tablet (500 mg total) by mouth 2 (two) times daily. 14 tablet 1   No current facility-administered medications on file prior to visit.      Past Medical History:  Diagnosis Date  . Anemia   . CAD (coronary artery disease)    no cardiologist, followed by PCP only  . Cataract   . Diverticulosis   . Generalized headaches   . GERD (gastroesophageal reflux disease)   . Glaucoma   . Hemorrhoids   . Hiatal hernia    On CT done Jan 31, 2011   . Hx of colonic polyps   . Hypertension   . Hyperthyroidism   . Osteoarthritis   . Uterine polyp   . Vitamin D deficiency     Past Surgical History:  Procedure Laterality Date  . ABDOMINAL HYSTERECTOMY N/A 04/26/2013   Procedure: HYSTERECTOMY ABDOMINAL;  Surgeon: Cyril Mourning, MD;  Location: [facility];  Service: Gynecology;  Laterality: N/A;  . DILATION AND CURETTAGE OF UTERUS  01/18/2013   with uterine polypectomy  . SALPINGOOPHORECTOMY Bilateral 04/26/2013   Procedure: SALPINGO OOPHORECTOMY;  Surgeon: Cyril Mourning, MD;  Location: [facility];  Service: Gynecology;  Laterality: Bilateral;    Social History   Socioeconomic History  . Marital status: Legally Separated    Spouse name: n/a  . Number of children: 3  . Years of education: Not on file  . Highest education level: Not on file  Occupational History  . Occupation: Forensic psychologist: [employer]    Comment: [place]  Social Needs  . Financial resource strain: Not on file  . Food insecurity:    Worry: Not on file    Inability: Not on file  . Transportation needs:    Medical: Not on file    Non-medical: Not on file  Tobacco Use  . Smoking status: Never Smoker  . Smokeless tobacco: Never Used  Substance and Sexual Activity  . Alcohol use: No    Alcohol/week: 0.0 standard drinks  . Drug use: No  . Sexual activity: Yes  Lifestyle  . Physical activity:    Days per week: Not on file    Minutes per session: Not on file  . Stress: Not on file  Relationships  . Social connections:    Talks on phone: Not on file    Gets together: Not on file    Attends religious service: Not on file     Active member of club or organization: Not on file    Attends meetings of clubs or organizations: Not on file    Relationship status: Not on file  Other Topics Concern  . Not on file  Social History Narrative   Lives with her daughter and granddaughter.    Family History  Problem Relation Age of Onset  . Hypertension Mother   . Hypertension Father   . Heart failure Father   . Deep vein thrombosis Son 57  x 2, chronic coumadin  . Hypertension Sister   . CVA Brother   . Diabetes Brother   . Hypertension Brother   . Colon cancer Neg Hx     Review of Systems  Constitutional: Negative for chills, fatigue and fever.  Respiratory: Positive for shortness of breath (one night - two nights ago). Negative for cough and wheezing.   Cardiovascular: Negative for chest pain, palpitations and leg swelling.  Gastrointestinal: Negative for nausea.       No gerd  Genitourinary: Negative for dysuria and frequency.  Neurological: Negative for light-headedness and headaches.       Objective:   Vitals:   07/08/18 1414  BP: 140/82  Pulse: 61  Resp: 16  Temp: 98.7 F (37.1 C)  SpO2: 96%   BP Readings from Last 3 Encounters:  07/08/18 140/82  05/22/18 128/78  05/08/18 136/88   Wt Readings from Last 3 Encounters:  07/08/18 173 lb 12.8 oz (78.8 kg)  05/22/18 173 lb (78.5 kg)  05/08/18 173 lb (78.5 kg)   Body mass index is 29.83 kg/m.   Physical Exam    Constitutional: Appears well-developed and well-nourished. No distress.  HENT:  Head: Normocephalic and atraumatic.  Neck: Neck supple. No tracheal deviation present. No thyromegaly present.  No cervical lymphadenopathy Cardiovascular: Normal rate, regular rhythm and normal heart sounds.   No murmur heard. No carotid bruit .  No edema Pulmonary/Chest: no rib pain with palpation, Effort normal and breath sounds normal. No respiratory distress. No has no wheezes. No rales.  Abdomen: soft, minimal tenderness epigastric region  and b/l lateral abdomen, no other tenderness, no rebound or guarding, ND Skin: Skin is warm and dry. Not diaphoretic.  Psychiatric: Normal mood and affect. Behavior is normal.       Assessment & Plan:    See Problem List for Assessment and Plan of chronic medical problems.

## 2018-07-08 ENCOUNTER — Ambulatory Visit: Payer: BLUE CROSS/BLUE SHIELD | Admitting: Internal Medicine

## 2018-07-08 ENCOUNTER — Ambulatory Visit (INDEPENDENT_AMBULATORY_CARE_PROVIDER_SITE_OTHER)
Admission: RE | Admit: 2018-07-08 | Discharge: 2018-07-08 | Disposition: A | Discharge status: Home / Self Care | Payer: BLUE CROSS/BLUE SHIELD | Source: Ambulatory Visit | Attending: Internal Medicine | Admitting: Internal Medicine

## 2018-07-08 ENCOUNTER — Other Ambulatory Visit (INDEPENDENT_AMBULATORY_CARE_PROVIDER_SITE_OTHER): Payer: BLUE CROSS/BLUE SHIELD

## 2018-07-08 ENCOUNTER — Encounter: Payer: Self-pay | Admitting: Internal Medicine

## 2018-07-08 VITALS — BP 140/82 | HR 61 | Temp 98.7°F | Resp 16 | Ht 64.0 in | Wt 173.8 lb

## 2018-07-08 DIAGNOSIS — R1013 Epigastric pain: Secondary | ICD-10-CM

## 2018-07-08 DIAGNOSIS — J90 Pleural effusion, not elsewhere classified: Secondary | ICD-10-CM

## 2018-07-08 DIAGNOSIS — R0602 Shortness of breath: Secondary | ICD-10-CM | POA: Diagnosis not present

## 2018-07-08 DIAGNOSIS — R079 Chest pain, unspecified: Secondary | ICD-10-CM | POA: Diagnosis not present

## 2018-07-08 LAB — CBC WITH DIFFERENTIAL/PLATELET
BASOS ABS: 0 10*3/uL (ref 0.0–0.1)
BASOS PCT: 1.1 % (ref 0.0–3.0)
EOS ABS: 0.3 10*3/uL (ref 0.0–0.7)
Eosinophils Relative: 8 % — ABNORMAL HIGH (ref 0.0–5.0)
HEMATOCRIT: 32.9 % — AB (ref 36.0–46.0)
Hemoglobin: 11.2 g/dL — ABNORMAL LOW (ref 12.0–15.0)
LYMPHS ABS: 1.1 10*3/uL (ref 0.7–4.0)
Lymphocytes Relative: 29.4 % (ref 12.0–46.0)
MCHC: 33.9 g/dL (ref 30.0–36.0)
MCV: 98.7 fl (ref 78.0–100.0)
Monocytes Absolute: 0.3 10*3/uL (ref 0.1–1.0)
Monocytes Relative: 8.1 % (ref 3.0–12.0)
NEUTROS ABS: 2 10*3/uL (ref 1.4–7.7)
NEUTROS PCT: 53.4 % (ref 43.0–77.0)
Platelets: 200 10*3/uL (ref 150.0–400.0)
RBC: 3.34 Mil/uL — ABNORMAL LOW (ref 3.87–5.11)
RDW: 12.6 % (ref 11.5–15.5)
WBC: 3.7 10*3/uL — ABNORMAL LOW (ref 4.0–10.5)

## 2018-07-08 LAB — COMPREHENSIVE METABOLIC PANEL
ALT: 19 U/L (ref 0–35)
AST: 21 U/L (ref 0–37)
Albumin: 3.9 g/dL (ref 3.5–5.2)
Alkaline Phosphatase: 42 U/L (ref 39–117)
BILIRUBIN TOTAL: 0.4 mg/dL (ref 0.2–1.2)
BUN: 17 mg/dL (ref 6–23)
CALCIUM: 9.5 mg/dL (ref 8.4–10.5)
CHLORIDE: 102 meq/L (ref 96–112)
CO2: 33 meq/L — AB (ref 19–32)
Creatinine, Ser: 0.84 mg/dL (ref 0.40–1.20)
GFR: 85.69 mL/min (ref >60.00)
GLUCOSE: 101 mg/dL — AB (ref 70–99)
Potassium: 3 mEq/L — ABNORMAL LOW (ref 3.5–5.1)
Sodium: 139 mEq/L (ref 135–145)
Total Protein: 7.6 g/dL (ref 6.0–8.3)

## 2018-07-08 LAB — TROPONIN I: TNIDX: 0.01 ug/l (ref 0.00–0.06)

## 2018-07-08 NOTE — Assessment & Plan Note (Addendum)
Pain focused in epigastric region and radiates around lower chest b/l Increased pain with deep breaths and movements ? Pleuritic - given history of pleural effusions will check CXR Atypical for cardiac ischemia but will check EKG and troponin EKG: Sinus Bradycardia at 57 bpm, Negative precordial T-waves. No change compared to prior EKG  ? Gastric/GI related - GB was normal on CT 10/2017, could be gastritis or GERD Cbc, cmp If pain worsens may need to go to the ED

## 2018-07-08 NOTE — Patient Instructions (Signed)
An EKG was done today.    Have blood work and a chest x-ray today.    If your symptoms do not improve or worsen you should go to the ED.  If you are still having symptoms next week you should follow up with Dr Alain Marion.

## 2018-07-08 NOTE — Assessment & Plan Note (Signed)
Having lower chest discomfort and feels it is similar to two months ago when she was diagnosed with b/l pleural effusions - concerned they are back She denies cough, sob (except for one time), wheeze Increased pain with movement and deep breaths CXR today

## 2018-07-09 ENCOUNTER — Telehealth: Payer: Self-pay | Admitting: Internal Medicine

## 2018-07-09 ENCOUNTER — Other Ambulatory Visit: Payer: Self-pay | Admitting: Internal Medicine

## 2018-07-09 DIAGNOSIS — R9389 Abnormal findings on diagnostic imaging of other specified body structures: Secondary | ICD-10-CM

## 2018-07-09 DIAGNOSIS — R350 Frequency of micturition: Secondary | ICD-10-CM

## 2018-07-09 NOTE — Telephone Encounter (Signed)
Pt notified to take 40mg  daily.

## 2018-07-09 NOTE — Telephone Encounter (Signed)
Pt states she has noticed an increase in urinating at night and thinks it may be from the Carvedilol. She would like to know what to do about this issue. She is also wondering about the Breo inhaler on whether she was supposed to continue it daily?

## 2018-07-09 NOTE — Telephone Encounter (Signed)
Copied from [place] [phone]. Topic: Quick Communication - See Telephone Encounter >> Jul 09, 2018  1:49 PM Bea Graff, NT wrote: CRM for notification. See Telephone encounter for: 07/09/18. Pt states she got a call stating she needs to take 20 mg pf potassium and to increase what she is taking. Pt states she takes 1 10 mg tablet twice a day which is 20 mg daily so she is wanting to know if the provider is wanting her to take 40 mg daily? Please advise.

## 2018-07-10 NOTE — Telephone Encounter (Signed)
Pt.notified

## 2018-07-10 NOTE — Telephone Encounter (Signed)
UA Doubt Carvedilol causing urinary issue Breo daily Thx

## 2018-07-14 ENCOUNTER — Other Ambulatory Visit (INDEPENDENT_AMBULATORY_CARE_PROVIDER_SITE_OTHER): Payer: BLUE CROSS/BLUE SHIELD

## 2018-07-14 DIAGNOSIS — R350 Frequency of micturition: Secondary | ICD-10-CM

## 2018-07-14 DIAGNOSIS — Z0000 Encounter for general adult medical examination without abnormal findings: Secondary | ICD-10-CM | POA: Diagnosis not present

## 2018-07-14 LAB — URINALYSIS
BILIRUBIN URINE: NEGATIVE
HGB URINE DIPSTICK: NEGATIVE
KETONES UR: NEGATIVE
Leukocytes, UA: NEGATIVE
Nitrite: NEGATIVE
Specific Gravity, Urine: 1.01 (ref 1.000–1.030)
TOTAL PROTEIN, URINE-UPE24: NEGATIVE
URINE GLUCOSE: NEGATIVE
UROBILINOGEN UA: 0.2 (ref 0.0–1.0)
pH: 7.5 (ref 5.0–8.0)

## 2018-07-15 LAB — URINE CULTURE
MICRO NUMBER:: 91238320
SPECIMEN QUALITY:: ADEQUATE

## 2018-07-21 ENCOUNTER — Other Ambulatory Visit: Payer: Self-pay | Admitting: Internal Medicine

## 2018-07-23 ENCOUNTER — Encounter: Payer: Self-pay | Admitting: Pulmonary Disease

## 2018-07-23 ENCOUNTER — Ambulatory Visit (INDEPENDENT_AMBULATORY_CARE_PROVIDER_SITE_OTHER): Payer: BLUE CROSS/BLUE SHIELD | Admitting: Pulmonary Disease

## 2018-07-23 VITALS — BP 110/70 | HR 63 | Ht 63.0 in | Wt 171.4 lb

## 2018-07-23 DIAGNOSIS — R0602 Shortness of breath: Secondary | ICD-10-CM | POA: Diagnosis not present

## 2018-07-23 DIAGNOSIS — I517 Cardiomegaly: Secondary | ICD-10-CM

## 2018-07-23 DIAGNOSIS — R918 Other nonspecific abnormal finding of lung field: Secondary | ICD-10-CM

## 2018-07-23 NOTE — Progress Notes (Signed)
Synopsis: Referred in Oct. 2019 for abnormal CXR by [name], MD  Subjective:   PATIENT ID: Kelly Santos GENDER: female DOB: 1946-04-07, MRN: [id number]  Chief Complaint  Patient presents with  . Consult    States she had deep soreness and SOB in her chest and that led to CXR. Chest still feels sore.     PMH CAD, GERD, Glaucoma, HTN, Low Vitamin D. She had CP and was seen by her PCP. She had CXR. It was a pain in the chest and deep soreness. She did not have any sharp pains. This occurred last month. And had no additional episodes. 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 She loves her job and loves working with the residents. She has had a little SOB on occasion, noticed once at night time while laying flat and she noticed while up walking. Not something that she has all of the time. It just comes and goes and may notice a slight increase in her breath. No cough sputum production, fevers or chills.   Patient attempted to smoke many years ago as a child and was able to do so because of it.  So she never picked up the bad habit.  She is a lifelong otherwise non-smoker, no drug use, no alcohol use.   Past Medical History:  Diagnosis Date  . Anemia   . CAD (coronary artery disease)    no cardiologist, followed by PCP only  . Cataract   . Diverticulosis   . Generalized headaches   . GERD (gastroesophageal reflux disease)   . Glaucoma   . Hemorrhoids   . Hiatal hernia    On CT done Jan 31, 2011   . Hx of colonic polyps   . Hypertension   . Hyperthyroidism   . Osteoarthritis   . Uterine polyp   . Vitamin D deficiency      Family History  Problem Relation Age of Onset  . Hypertension Mother   . Hypertension Father   . Heart failure Father   . Deep vein thrombosis Son 35       x 2, chronic coumadin  . Hypertension Sister   . CVA Brother   . Diabetes Brother   . Hypertension Brother   . Colon cancer Neg Hx      Past Surgical History:    Procedure Laterality Date  . ABDOMINAL HYSTERECTOMY N/A 04/26/2013   Procedure: HYSTERECTOMY ABDOMINAL;  Surgeon: Cyril Mourning, MD;  Location: [facility];  Service: Gynecology;  Laterality: N/A;  . DILATION AND CURETTAGE OF UTERUS  01/18/2013   with uterine polypectomy  . SALPINGOOPHORECTOMY Bilateral 04/26/2013   Procedure: SALPINGO OOPHORECTOMY;  Surgeon: Cyril Mourning, MD;  Location: [facility];  Service: Gynecology;  Laterality: Bilateral;    Social History   Socioeconomic History  . Marital status: Legally Separated    Spouse name: n/a  . Number of children: 3  . Years of education: Not on file  . Highest education level: Not on file  Occupational History  . Occupation: Forensic psychologist: [employer]    Comment: [place]  Social Needs  . Financial resource strain: Not on file  . Food insecurity:    Worry: Not on file    Inability: Not on file  . Transportation needs:    Medical: Not on file    Non-medical: Not on file  Tobacco Use  . Smoking status: Never Smoker  . Smokeless tobacco:  Never Used  Substance and Sexual Activity  . Alcohol use: No    Alcohol/week: 0.0 standard drinks  . Drug use: No  . Sexual activity: Yes  Lifestyle  . Physical activity:    Days per week: Not on file    Minutes per session: Not on file  . Stress: Not on file  Relationships  . Social connections:    Talks on phone: Not on file    Gets together: Not on file    Attends religious service: Not on file    Active member of club or organization: Not on file    Attends meetings of clubs or organizations: Not on file    Relationship status: Not on file  . Intimate partner violence:    Fear of current or ex partner: Not on file    Emotionally abused: Not on file    Physically abused: Not on file    Forced sexual activity: Not on file  Other Topics Concern  . Not on file  Social History Narrative   Lives with her daughter and granddaughter.     Allergies  Allergen  Reactions  . Amlodipine     edema     Outpatient Medications Prior to Visit  Medication Sig Dispense Refill  . Azilsartan-Chlorthalidone (EDARBYCLOR) 40-25 MG TABS Take 1 tablet by mouth every morning. 30 tablet 11  . carvedilol (COREG) 25 MG tablet Take 1 tablet (25 mg total) by mouth 2 (two) times daily. 60 tablet 11  . cholecalciferol (VITAMIN D) 1000 UNITS tablet Take 1,000 Units by mouth every other day.     . COMBIGAN 0.2-0.5 % ophthalmic solution INSTILL 1 DROP INTO BOTH EYES TWICE A DAY 5 mL 2  . Ferrous Sulfate (IRON) 325 (65 FE) MG TABS Take 1 tablet by mouth 3 (three) times a week.     . fluticasone furoate-vilanterol (BREO ELLIPTA) 100-25 MCG/INH AEPB Inhale 1 puff into the lungs daily. 1 each 5  . hydrocortisone (PROCTOCORT) 1 % CREA Apply 1 application topically 2 (two) times daily as needed (dry skin).     Marland Kitchen ibuprofen (ADVIL,MOTRIN) 600 MG tablet TAKE TWICE A DAY X 2 WEEKS, THEN PRN PAIN 60 tablet 3  . potassium chloride (K-DUR) 10 MEQ tablet Take 1 tablet (10 mEq total) by mouth 2 (two) times daily. 60 tablet 11  . triamcinolone cream (KENALOG) 0.5 % Apply topically 2 (two) times daily as needed. 30 g 2  . valACYclovir (VALTREX) 500 MG tablet Take 1 tablet (500 mg total) by mouth 2 (two) times daily. 14 tablet 1  . amitriptyline (ELAVIL) 25 MG tablet Take 1 tablet (25 mg total) by mouth daily as needed. Due for routine OV (Patient not taking: Reported on 07/23/2018) 30 tablet 1  . cloNIDine (CATAPRES) 0.1 MG tablet Take 1 tablet (0.1 mg total) by mouth 2 (two) times daily as needed. Take if BP>170/110 (Patient not taking: Reported on 07/23/2018) 60 tablet 1  . cetirizine (ZYRTEC) 10 MG tablet Take 1 tablet (10 mg total) by mouth daily. (Patient not taking: Reported on 07/23/2018) 30 tablet 11  . meloxicam (MOBIC) 15 MG tablet Take 1 tablet (15 mg total) by mouth daily. (Patient not taking: Reported on 07/23/2018) 30 tablet 0  . triamcinolone (KENALOG) 0.1 % paste Use as directed  1 application in the mouth or throat 4 (four) times daily. Apply to mouth ulcers (Patient not taking: Reported on 07/23/2018) 5 g 1   No facility-administered medications prior to visit.  Review of Systems  Constitutional: Negative.   HENT: Negative.   Eyes: Negative.   Respiratory: Positive for shortness of breath and wheezing. Negative for cough, hemoptysis and sputum production.   Cardiovascular: Positive for leg swelling. Negative for chest pain, palpitations, orthopnea, claudication and PND.  Gastrointestinal: Negative.   Genitourinary: Negative.   Musculoskeletal: Negative.   Skin: Negative.   Neurological: Negative.   Endo/Heme/Allergies: Negative.   Psychiatric/Behavioral: Negative.      Objective:  Physical Exam  Constitutional: She is oriented to person, place, and time. She appears well-developed and well-nourished. No distress.  HENT:  Head: Normocephalic and atraumatic.  Mouth/Throat: Oropharynx is clear and moist.  Eyes: Pupils are equal, round, and reactive to light. Conjunctivae are normal. No scleral icterus.  Neck: Neck supple. No JVD present. No tracheal deviation present.  Cardiovascular: Normal rate, regular rhythm, normal heart sounds and intact distal pulses.  No murmur heard. Pulmonary/Chest: Effort normal and breath sounds normal. No accessory muscle usage or stridor. No tachypnea. No respiratory distress. She has no wheezes. She has no rhonchi. She has no rales.  Abdominal: Soft. Bowel sounds are normal. She exhibits no distension. There is no tenderness.  Musculoskeletal: She exhibits no edema or tenderness.  Lymphadenopathy:    She has no cervical adenopathy.  Neurological: She is alert and oriented to person, place, and time.  Skin: Skin is warm and dry. Capillary refill takes less than 2 seconds. No rash noted.  Psychiatric: She has a normal mood and affect. Her behavior is normal.  Vitals reviewed.    Vitals:   07/23/18 1126  BP: 110/70    Pulse: 63  SpO2: 100%  Weight: 171 lb 6.4 oz (77.7 kg)  Height: 5\' 3"  (1.6 m)   100% on RA BMI Readings from Last 3 Encounters:  07/23/18 30.36 kg/m  07/08/18 29.83 kg/m  05/22/18 29.70 kg/m   Wt Readings from Last 3 Encounters:  07/23/18 171 lb 6.4 oz (77.7 kg)  07/08/18 173 lb 12.8 oz (78.8 kg)  05/22/18 173 lb (78.5 kg)     CBC    Component Value Date/Time   WBC 3.7 (L) 07/08/2018 1532   RBC 3.34 (L) 07/08/2018 1532   HGB 11.2 (L) 07/08/2018 1532   HCT 32.9 (L) 07/08/2018 1532   PLT 200.0 07/08/2018 1532   MCV 98.7 07/08/2018 1532   MCV 94.9 04/23/2016 1054   MCH 33.0 05/06/2018 1353   MCHC 33.9 07/08/2018 1532   RDW 12.6 07/08/2018 1532   LYMPHSABS 1.1 07/08/2018 1532   MONOABS 0.3 07/08/2018 1532   EOSABS 0.3 07/08/2018 1532   BASOSABS 0.0 07/08/2018 1532    Chest Imaging: Chest x-ray 07/08/2018: Mild cardiomegaly, increased opacity within the right middle lobe  Pulmonary Functions Testing Results: None   FeNO: None   Pathology: None   Echocardiogram: prior stress  Heart Catheterization: None     Assessment & Plan:   Pulmonary infiltrate in right lung on CXR - Plan: CT Chest Wo Contrast  Shortness of breath - Plan: CT Chest Wo Contrast  Cardiomegaly  Discussion:  This is a 72 year old female with a abnormal chest x-ray that was evaluated for chest pain.  She has a infiltrate within the right middle lobe as seen on lateral imaging.  This imaging was reviewed with the patient in the office today.  We will plan to obtain a noncontrasted imaging of the chest for further evaluation. She also has borderline cardiomegaly on the film.  Also on chest imaging a  question as if the aorta is slightly enlarged or tortuous.  CT image will also evaluate this.  We will call patient with the results of the CT imaging. Return to clinic pending the CT results.    Current Outpatient Medications:  .  Azilsartan-Chlorthalidone (EDARBYCLOR) 40-25 MG TABS, Take  1 tablet by mouth every morning., Disp: 30 tablet, Rfl: 11 .  carvedilol (COREG) 25 MG tablet, Take 1 tablet (25 mg total) by mouth 2 (two) times daily., Disp: 60 tablet, Rfl: 11 .  cholecalciferol (VITAMIN D) 1000 UNITS tablet, Take 1,000 Units by mouth every other day. , Disp: , Rfl:  .  COMBIGAN 0.2-0.5 % ophthalmic solution, INSTILL 1 DROP INTO BOTH EYES TWICE A DAY, Disp: 5 mL, Rfl: 2 .  Ferrous Sulfate (IRON) 325 (65 FE) MG TABS, Take 1 tablet by mouth 3 (three) times a week. , Disp: , Rfl:  .  fluticasone furoate-vilanterol (BREO ELLIPTA) 100-25 MCG/INH AEPB, Inhale 1 puff into the lungs daily., Disp: 1 each, Rfl: 5 .  hydrocortisone (PROCTOCORT) 1 % CREA, Apply 1 application topically 2 (two) times daily as needed (dry skin). , Disp: , Rfl:  .  ibuprofen (ADVIL,MOTRIN) 600 MG tablet, TAKE TWICE A DAY X 2 WEEKS, THEN PRN PAIN, Disp: 60 tablet, Rfl: 3 .  potassium chloride (K-DUR) 10 MEQ tablet, Take 1 tablet (10 mEq total) by mouth 2 (two) times daily., Disp: 60 tablet, Rfl: 11 .  triamcinolone cream (KENALOG) 0.5 %, Apply topically 2 (two) times daily as needed., Disp: 30 g, Rfl: 2 .  valACYclovir (VALTREX) 500 MG tablet, Take 1 tablet (500 mg total) by mouth 2 (two) times daily., Disp: 14 tablet, Rfl: 1 .  amitriptyline (ELAVIL) 25 MG tablet, Take 1 tablet (25 mg total) by mouth daily as needed. Due for routine OV (Patient not taking: Reported on 07/23/2018), Disp: 30 tablet, Rfl: 1 .  cloNIDine (CATAPRES) 0.1 MG tablet, Take 1 tablet (0.1 mg total) by mouth 2 (two) times daily as needed. Take if BP>170/110 (Patient not taking: Reported on 07/23/2018), Disp: 60 tablet, Rfl: 1   Garner Nash, DO [facility] 07/23/2018 12:13 PM

## 2018-07-23 NOTE — Patient Instructions (Addendum)
Thank you for visiting Dr. Valeta Harms at [facility]. Today we recommend the following: Orders Placed This Encounter  Procedures  . CT Chest Wo Contrast   No orders of the defined types were placed in this encounter.  Return if symptoms worsen or fail to improve.  We are moving our office in November. The new address will be: [address] [employer] [address] Phone: [phone]

## 2018-07-30 ENCOUNTER — Ambulatory Visit (INDEPENDENT_AMBULATORY_CARE_PROVIDER_SITE_OTHER)
Admission: RE | Admit: 2018-07-30 | Discharge: 2018-07-30 | Disposition: A | Discharge status: Home / Self Care | Payer: BLUE CROSS/BLUE SHIELD | Source: Ambulatory Visit | Attending: Pulmonary Disease | Admitting: Pulmonary Disease

## 2018-07-30 DIAGNOSIS — R918 Other nonspecific abnormal finding of lung field: Secondary | ICD-10-CM

## 2018-07-30 DIAGNOSIS — R0602 Shortness of breath: Secondary | ICD-10-CM

## 2018-10-26 DIAGNOSIS — Z01419 Encounter for gynecological examination (general) (routine) without abnormal findings: Secondary | ICD-10-CM | POA: Diagnosis not present

## 2018-10-26 DIAGNOSIS — Z1231 Encounter for screening mammogram for malignant neoplasm of breast: Secondary | ICD-10-CM | POA: Diagnosis not present

## 2018-10-26 DIAGNOSIS — Z6829 Body mass index (BMI) 29.0-29.9, adult: Secondary | ICD-10-CM | POA: Diagnosis not present

## 2018-12-17 ENCOUNTER — Other Ambulatory Visit: Payer: Self-pay | Admitting: Internal Medicine

## 2018-12-17 ENCOUNTER — Ambulatory Visit (INDEPENDENT_AMBULATORY_CARE_PROVIDER_SITE_OTHER)
Admission: RE | Admit: 2018-12-17 | Discharge: 2018-12-17 | Disposition: A | Discharge status: Home / Self Care | Payer: BLUE CROSS/BLUE SHIELD | Source: Ambulatory Visit | Attending: Internal Medicine | Admitting: Internal Medicine

## 2018-12-17 ENCOUNTER — Other Ambulatory Visit (INDEPENDENT_AMBULATORY_CARE_PROVIDER_SITE_OTHER): Payer: BLUE CROSS/BLUE SHIELD

## 2018-12-17 ENCOUNTER — Other Ambulatory Visit: Payer: Self-pay

## 2018-12-17 ENCOUNTER — Encounter: Payer: Self-pay | Admitting: Internal Medicine

## 2018-12-17 ENCOUNTER — Ambulatory Visit: Payer: BLUE CROSS/BLUE SHIELD | Admitting: Internal Medicine

## 2018-12-17 VITALS — BP 142/90 | HR 65 | Temp 98.5°F | Ht 63.0 in | Wt 174.0 lb

## 2018-12-17 DIAGNOSIS — R05 Cough: Secondary | ICD-10-CM | POA: Diagnosis not present

## 2018-12-17 DIAGNOSIS — R5382 Chronic fatigue, unspecified: Secondary | ICD-10-CM | POA: Diagnosis not present

## 2018-12-17 DIAGNOSIS — R0789 Other chest pain: Secondary | ICD-10-CM

## 2018-12-17 DIAGNOSIS — R079 Chest pain, unspecified: Secondary | ICD-10-CM | POA: Diagnosis not present

## 2018-12-17 DIAGNOSIS — I10 Essential (primary) hypertension: Secondary | ICD-10-CM

## 2018-12-17 DIAGNOSIS — R059 Cough, unspecified: Secondary | ICD-10-CM

## 2018-12-17 LAB — BASIC METABOLIC PANEL
BUN: 19 mg/dL (ref 6–23)
CO2: 30 mEq/L (ref 19–32)
Calcium: 9.9 mg/dL (ref 8.4–10.5)
Chloride: 100 mEq/L (ref 96–112)
Creatinine, Ser: 0.78 mg/dL (ref 0.40–1.20)
GFR: 87.71 mL/min (ref >60.00)
Glucose, Bld: 115 mg/dL — ABNORMAL HIGH (ref 70–99)
POTASSIUM: 2.9 meq/L — AB (ref 3.5–5.1)
SODIUM: 136 meq/L (ref 135–145)

## 2018-12-17 LAB — CBC WITH DIFFERENTIAL/PLATELET
BASOS ABS: 0 10*3/uL (ref 0.0–0.1)
Basophils Relative: 1 % (ref 0.0–3.0)
EOS PCT: 2.5 % (ref 0.0–5.0)
Eosinophils Absolute: 0.1 10*3/uL (ref 0.0–0.7)
HCT: 36.4 % (ref 36.0–46.0)
HEMOGLOBIN: 12.5 g/dL (ref 12.0–15.0)
Lymphocytes Relative: 19.5 % (ref 12.0–46.0)
Lymphs Abs: 0.9 10*3/uL (ref 0.7–4.0)
MCHC: 34.4 g/dL (ref 30.0–36.0)
MCV: 99.8 fl (ref 78.0–100.0)
MONOS PCT: 10.6 % (ref 3.0–12.0)
Monocytes Absolute: 0.5 10*3/uL (ref 0.1–1.0)
Neutro Abs: 2.9 10*3/uL (ref 1.4–7.7)
Neutrophils Relative %: 66.4 % (ref 43.0–77.0)
Platelets: 186 10*3/uL (ref 150.0–400.0)
RBC: 3.65 Mil/uL — AB (ref 3.87–5.11)
RDW: 12.2 % (ref 11.5–15.5)
WBC: 4.4 10*3/uL (ref 4.0–10.5)

## 2018-12-17 LAB — HEPATIC FUNCTION PANEL
ALT: 20 U/L (ref 0–35)
AST: 18 U/L (ref 0–37)
Albumin: 3.8 g/dL (ref 3.5–5.2)
Alkaline Phosphatase: 44 U/L (ref 39–117)
Bilirubin, Direct: 0 mg/dL (ref 0.0–0.3)
TOTAL PROTEIN: 7.4 g/dL (ref 6.0–8.3)
Total Bilirubin: 0.3 mg/dL (ref 0.2–1.2)

## 2018-12-17 LAB — URINALYSIS
Bilirubin Urine: NEGATIVE
Ketones, ur: NEGATIVE
LEUKOCYTE UA: NEGATIVE
NITRITE: NEGATIVE
Specific Gravity, Urine: <=1.005 — AB (ref 1.000–1.030)
TOTAL PROTEIN, URINE-UPE24: NEGATIVE
Urine Glucose: NEGATIVE
Urobilinogen, UA: 0.2 (ref 0.0–1.0)
pH: 6.5 (ref 5.0–8.0)

## 2018-12-17 LAB — TSH: TSH: 0.81 u[IU]/mL (ref 0.35–4.50)

## 2018-12-17 MED ORDER — AMITRIPTYLINE HCL 25 MG PO TABS: 25.0000 mg | ORAL_TABLET | Freq: Every evening | ORAL | 5 refills | Status: DC | PRN

## 2018-12-17 MED ORDER — AZITHROMYCIN 250 MG PO TABS: ORAL_TABLET | ORAL | 0 refills | Status: DC

## 2018-12-17 MED ORDER — POTASSIUM CHLORIDE CRYS ER 20 MEQ PO TBCR: 20.0000 meq | EXTENDED_RELEASE_TABLET | Freq: Two times a day (BID) | ORAL | 11 refills | Status: DC

## 2018-12-17 MED ORDER — TRIAMCINOLONE ACETONIDE 0.5 % EX CREA: TOPICAL_CREAM | Freq: Two times a day (BID) | CUTANEOUS | 2 refills | Status: DC | PRN

## 2018-12-17 NOTE — Progress Notes (Signed)
Subjective:  Patient ID: Kelly Santos, female    DOB: Jun 19, 1946  Age: 73 y.o. MRN: [id number]  CC: No chief complaint on file.   HPI Kelly Santos presents for fatigue x 7-10 and URI sx's C/o pain in the lower chest, dry cough - worse  Outpatient Medications Prior to Visit  Medication Sig Dispense Refill  . amitriptyline (ELAVIL) 25 MG tablet Take 1 tablet (25 mg total) by mouth daily as needed. Due for routine OV 30 tablet 1  . Azilsartan-Chlorthalidone (EDARBYCLOR) 40-25 MG TABS Take 1 tablet by mouth every morning. 30 tablet 11  . carvedilol (COREG) 25 MG tablet Take 1 tablet (25 mg total) by mouth 2 (two) times daily. 60 tablet 11  . cholecalciferol (VITAMIN D) 1000 UNITS tablet Take 1,000 Units by mouth every other day.     . cloNIDine (CATAPRES) 0.1 MG tablet Take 1 tablet (0.1 mg total) by mouth 2 (two) times daily as needed. Take if BP>170/110 60 tablet 1  . COMBIGAN 0.2-0.5 % ophthalmic solution INSTILL 1 DROP INTO BOTH EYES TWICE A DAY 5 mL 2  . Ferrous Sulfate (IRON) 325 (65 FE) MG TABS Take 1 tablet by mouth 3 (three) times a week.     . fluticasone furoate-vilanterol (BREO ELLIPTA) 100-25 MCG/INH AEPB Inhale 1 puff into the lungs daily. 1 each 5  . hydrocortisone (PROCTOCORT) 1 % CREA Apply 1 application topically 2 (two) times daily as needed (dry skin).     Marland Kitchen ibuprofen (ADVIL,MOTRIN) 600 MG tablet TAKE TWICE A DAY X 2 WEEKS, THEN PRN PAIN 60 tablet 3  . potassium chloride (K-DUR) 10 MEQ tablet Take 1 tablet (10 mEq total) by mouth 2 (two) times daily. 60 tablet 11  . triamcinolone cream (KENALOG) 0.5 % Apply topically 2 (two) times daily as needed. 30 g 2  . valACYclovir (VALTREX) 500 MG tablet Take 1 tablet (500 mg total) by mouth 2 (two) times daily. 14 tablet 1   No facility-administered medications prior to visit.     ROS: Review of Systems  Constitutional: Positive for fatigue. Negative for activity change, appetite change, chills, fever and unexpected weight  change.  HENT: Negative for congestion, mouth sores and sinus pressure.   Eyes: Negative for visual disturbance.  Respiratory: Positive for cough. Negative for chest tightness and shortness of breath.   Cardiovascular: Positive for chest pain.  Gastrointestinal: Negative for abdominal pain and nausea.  Genitourinary: Negative for difficulty urinating, frequency and vaginal pain.  Musculoskeletal: Negative for back pain and gait problem.  Skin: Negative for pallor and rash.  Neurological: Negative for dizziness, tremors, weakness, numbness and headaches.  Psychiatric/Behavioral: Negative for confusion, sleep disturbance and suicidal ideas.    Objective:  BP (!) 142/90 (BP Location: Left Arm, Patient Position: Sitting, Cuff Size: Normal)   Pulse 65   Temp 98.5 F (36.9 C) (Oral)   Ht 5\' 3"  (1.6 m)   Wt 174 lb (78.9 kg)   SpO2 98%   BMI 30.82 kg/m   BP Readings from Last 3 Encounters:  12/17/18 (!) 142/90  07/23/18 110/70  07/08/18 140/82    Wt Readings from Last 3 Encounters:  12/17/18 174 lb (78.9 kg)  07/23/18 171 lb 6.4 oz (77.7 kg)  07/08/18 173 lb 12.8 oz (78.8 kg)    Physical Exam Constitutional:      General: She is not in acute distress.    Appearance: She is well-developed.  HENT:     Head: Normocephalic.  Right Ear: External ear normal.     Left Ear: External ear normal.     Nose: Nose normal.  Eyes:     General:        Right eye: No discharge.        Left eye: No discharge.     Conjunctiva/sclera: Conjunctivae normal.     Pupils: Pupils are equal, round, and reactive to light.  Neck:     Musculoskeletal: Normal range of motion and neck supple.     Thyroid: No thyromegaly.     Vascular: No JVD.     Trachea: No tracheal deviation.  Cardiovascular:     Rate and Rhythm: Normal rate and regular rhythm.     Heart sounds: Normal heart sounds.  Pulmonary:     Effort: No respiratory distress.     Breath sounds: No stridor. No wheezing.  Abdominal:      General: Bowel sounds are normal. There is no distension.     Palpations: Abdomen is soft. There is no mass.     Tenderness: There is no abdominal tenderness. There is no guarding or rebound.  Musculoskeletal:        General: No tenderness.  Lymphadenopathy:     Cervical: No cervical adenopathy.  Skin:    Findings: No erythema or rash.  Neurological:     Cranial Nerves: No cranial nerve deficit.     Motor: No abnormal muscle tone.     Coordination: Coordination normal.     Deep Tendon Reflexes: Reflexes normal.  Psychiatric:        Behavior: Behavior normal.        Thought Content: Thought content normal.        Judgment: Judgment normal.     Lab Results  Component Value Date   WBC 3.7 (L) 07/08/2018   HGB 11.2 (L) 07/08/2018   HCT 32.9 (L) 07/08/2018   PLT 200.0 07/08/2018   GLUCOSE 101 (H) 07/08/2018   CHOL 151 05/22/2018   TRIG 112.0 05/22/2018   HDL 48.00 05/22/2018   LDLCALC 80 05/22/2018   ALT 19 07/08/2018   AST 21 07/08/2018   NA 139 07/08/2018   K 3.0 (L) 07/08/2018   CL 102 07/08/2018   CREATININE 0.84 07/08/2018   BUN 17 07/08/2018   CO2 33 (H) 07/08/2018   TSH 0.47 05/22/2018   INR 0.96 05/09/2011   HGBA1C 5.8 (H) 01/23/2017    Ct Chest Wo Contrast  Result Date: 07/30/2018 CLINICAL DATA:  Shortness of breath.  Abnormal chest x-ray. EXAM: CT CHEST WITHOUT CONTRAST TECHNIQUE: Multidetector CT imaging of the chest was performed following the standard protocol without IV contrast. COMPARISON:  Radiographs of July 08, 2018. CT scan of January 20, 2017. FINDINGS: Cardiovascular: No significant vascular findings. Normal heart size. No pericardial effusion. Mediastinum/Nodes: No enlarged mediastinal or axillary lymph nodes. Thyroid gland, trachea, and esophagus demonstrate no significant findings. Lungs/Pleura: No pneumothorax or pleural effusion is noted. Stable 4 mm subpleural nodule is noted in the right upper lobe best seen on image number 53 of series 3; this  is unchanged compared to prior exam and can be considered benign at this point. Minimal scarring or subsegmental atelectasis is noted medially in the posterior right lung base. Minimal scarring is noted in left lung base. Upper Abdomen: No acute abnormality. Musculoskeletal: No chest wall mass or suspicious bone lesions identified. IMPRESSION: Minimal scarring or subsegmental atelectasis is noted in both lung bases. No significant abnormality seen in the chest. Electronically Signed  By: Marijo Conception, M.D.   On: 07/30/2018 14:57    Assessment & Plan:   There are no diagnoses linked to this encounter.   No orders of the defined types were placed in this encounter.    Follow-up: No follow-ups on file.  Walker Kehr, MD

## 2018-12-17 NOTE — Assessment & Plan Note (Signed)
No change in meds Labs

## 2018-12-17 NOTE — Assessment & Plan Note (Signed)
CXR

## 2018-12-17 NOTE — Assessment & Plan Note (Signed)
CXR Zpac if worse Labs

## 2018-12-17 NOTE — Assessment & Plan Note (Signed)
post-viral vs other

## 2018-12-17 NOTE — Patient Instructions (Signed)
You can use over-the-counter  "cold" medicines  such as  " Delsym" or" Robitussin" cough syrup varietis for cough.  You can use plain "Tylenol" or "Advil" for fever, chills and achyness. Use Halls or Ricola cough drops.     Please, make an appointment if you are not better or if you're worse.

## 2018-12-25 ENCOUNTER — Ambulatory Visit (INDEPENDENT_AMBULATORY_CARE_PROVIDER_SITE_OTHER): Payer: BLUE CROSS/BLUE SHIELD

## 2018-12-25 DIAGNOSIS — R509 Fever, unspecified: Secondary | ICD-10-CM

## 2018-12-25 DIAGNOSIS — R531 Weakness: Secondary | ICD-10-CM

## 2018-12-25 NOTE — Telephone Encounter (Signed)
Cumulative time during 7-day interval 25, there was not an associated office visit for this concern within a 7 day period.  Verbal consent for services obtained from patient prior to services given.  PCP is Dr. Alain Santos, who is not available today.   Names of all persons present for services: Kelly Rail, MD from my office, patient, Kelly Santos, who is at home.  Information was also collected from [facility]   Chief complaint: Fever  Patient was in to see Dr. Alain Santos just over 1 week ago.  She was experiencing cold symptoms for the past 7-10 days including fatigue, pain in the lower chest, dry cough.  Her exam was unremarkable.  A chest x-ray was done, which was normal.  She was advised to take over-the-counter cold medications for symptom relief and take a Z-Pak if her symptoms did not improve.  She did take the Z-Pak and completed it.  She is also taken her allergy medication, Tylenol and Advil.  She states the cough only lasted a couple days and has not had any cough since then.  She is concerned because she had a temperature of 101.7 today.  She feels weak and slightly dehydrated.  She has some mucus in her throat that feels loose and is white in color.  She had diarrhea once this morning.  She has had some lower back discomfort.  She denies any nasal congestion, ear pain, sinus pain, sore throat, wheeze, shortness of breath, cough, body aches, nausea, headaches and lightheadedness.  She denies dysuria, increased urinary frequency or odor.  She is eating normally and thinks she is drinking plenty of Gatorade and water.  She was concerned that she still was not feeling well.   History, background, results pertinent:  Past Medical History:  Diagnosis Date  . Anemia   . CAD (coronary artery disease)    no cardiologist, followed by PCP only  . Cataract   . Diverticulosis   . Generalized headaches   . GERD (gastroesophageal reflux disease)   . Glaucoma   .  Hemorrhoids   . Hiatal hernia    On CT done Jan 31, 2011   . Hx of colonic polyps   . Hypertension   . Hyperthyroidism   . Osteoarthritis   . Uterine polyp   . Vitamin D deficiency   DG Chest 2 View CLINICAL DATA:  Cough and chest pain  EXAM: CHEST - 2 VIEW  COMPARISON:  07/08/2018  FINDINGS: The heart size and mediastinal contours are within normal limits. Both lungs are clear. The visualized skeletal structures are unremarkable.  IMPRESSION: No active cardiopulmonary disease.  Electronically Signed   By: Franchot Gallo M.D.   On: 12/17/2018 16:04  A/P/next steps: Fever, weakness, mild dehydration  Her symptoms sound viral in nature.  Discussed that she could possibly have coronavirus, but it is not recommended that we test her since treatment would not change given lack of shortness of breath, cough and other concerning symptoms  Discussed all the possible causes of her fever and feeling weak including bacterial infections.  There is no localized symptoms to suggest she needs an antibiotic so none will be prescribed today  She will continue her allergy medication, Tylenol, Advil if needed, drinking plenty of fluids and resting.  Discussed with her that if her symptoms change, her symptoms worsen or her fever increases/persist she needs to call.  Discussed that there is a doctor on call all the time if needed.  She understands  the plan and will let us know if anything changes

## 2018-12-25 NOTE — Telephone Encounter (Signed)
Please advise in dr. plotnikovs absence.

## 2018-12-25 NOTE — Telephone Encounter (Signed)
Returned call to patient. Left VM to return call to office. 

## 2018-12-25 NOTE — Telephone Encounter (Signed)
Returned call to patient who states that she has been feeling weak.  She states that her last visit she was given antibiotic that she has completed.  She is bothered all night with frequent urination. She has notice that in the evening she has been spiking fevers of 101. Today she is 98.6.  She has been treating with tylenol and advil. She has no cough but has loose mucus in her throat all the time. Pt E mail verified. Pt was advised that office was doing Web Ex visits. Pt states she doesn't understand how that will work and is requesting call. Call place to office.  I spoke with [place]. Per [place] pt was told the office staff would reach out to her but could not guarantee she would speak with Dr Alain Marion. Pt is aware. Care advice read to patient. Pt verbalized understanding of instructions. Reason for Disposition . [1] Fever > 101 F (38.3 C) AND [2] age > 35  Answer Assessment - Initial Assessment Questions 1. TEMPERATURE: "What is the most recent temperature?"  "How was it measured?"      1 hour ago 98.6 2. ONSET: "When did the fever start?"      1 week ago the 19th 3. SYMPTOMS: "Do you have any other symptoms besides the fever?"  (e.g., colds, headache, sore throat, earache, cough, rash, diarrhea, vomiting, abdominal pain)     Weakness, cough gone feels loose mucus in throat 4. CAUSE: If there are no symptoms, ask: "What do you think is causing the fever?"      Urinating a lot 5. CONTACTS: "Does anyone else in the family have an infection?"     no 6. TREATMENT: "What have you done so far to treat this fever?" (e.g., medications)     Tylenol and advil   7. IMMUNOCOMPROMISE: "Do you have of the following: diabetes, HIV positive, splenectomy, cancer chemotherapy, chronic steroid treatment, transplant patient, etc."     no 8. PREGNANCY: "Is there any chance you are pregnant?" "When was your last menstrual period?"     N/A 9. TRAVEL: "Have you traveled out of the country in the last month?"  (e.g., travel history, exposures)     No  Protocols used: FEVER-A-AH

## 2018-12-28 ENCOUNTER — Ambulatory Visit: Payer: Self-pay | Admitting: *Deleted

## 2018-12-28 ENCOUNTER — Telehealth (INDEPENDENT_AMBULATORY_CARE_PROVIDER_SITE_OTHER): Payer: BLUE CROSS/BLUE SHIELD | Admitting: Internal Medicine

## 2018-12-28 DIAGNOSIS — Z20828 Contact with and (suspected) exposure to other viral communicable diseases: Secondary | ICD-10-CM | POA: Diagnosis not present

## 2018-12-28 DIAGNOSIS — E876 Hypokalemia: Secondary | ICD-10-CM | POA: Diagnosis not present

## 2018-12-28 DIAGNOSIS — R509 Fever, unspecified: Secondary | ICD-10-CM

## 2018-12-28 DIAGNOSIS — R5382 Chronic fatigue, unspecified: Secondary | ICD-10-CM

## 2018-12-28 NOTE — Telephone Encounter (Signed)
Left message

## 2018-12-28 NOTE — Telephone Encounter (Signed)
Copied from [place] [id number]. Topic: Quick Communication - See Telephone Encounter >> Dec 28, 2018  6:54 PM Loma Boston wrote: CRM for notification. See Telephone encounter for: 12/28/18. Please see Nurse Triage notes! This patient did not get any additional feed back today and states she has come into direct contact with a co-worker who has [place]. Notes suggest seeing her physician with her symptoms  within 24 hours but she was not aware. Please follow up with her first thing in the morning. She only has a android and home phone and seems to be better suited to a telephonic visit. She is expecting someone to contact her first thing in the morning about a visit. The number she perfers is her mobile number at [phone]. She also can be reached at her home phone at [phone].

## 2018-12-28 NOTE — Telephone Encounter (Signed)
Please schedule tomorrow for a virtual visit. Thank you

## 2018-12-28 NOTE — Telephone Encounter (Signed)
Pt reports ongoing fever. Saw Dr. Alain Marion 12/17/2018. States did not have temp at that time, developed temp the next day. Reports presently 102.7 at 1400; took ibuprofen at 1200. States temp as been fluctuating from 99.8 to max of 102.9, unsure of which day max temp occurred.. States "Not really a cough, just have to clear my throat all the time like something is there." No swallowing difficulties. Reports "Very mild" SOB with coughing at times. Denies body aches, no dysuria. Has been taking ibuprofen for temps, "Stopped tylenol because it didn't help." States temps are just all over the place." Reports increased fatigue.Has not traveled, no known exposure. Care advise given pr protocol. Pt has iPhone, EMail verified. Please advise regarding appt. CB# [phone]  Reason for Disposition . Fever present > 3 days (72 hours)  Answer Assessment - Initial Assessment Questions 1. TEMPERATURE: "What is the most recent temperature?"  "How was it measured?"      102.0 at 1400. Took ibuprofen at 1200. 2. ONSET: "When did the fever start?"      12/18/2018 3. SYMPTOMS: "Do you have any other symptoms besides the fever?"  (e.g., colds, headache, sore throat, earache, cough, rash, diarrhea, vomiting, abdominal pain)    "Feels like something wants to come up but doesn't, not choking." 4. CAUSE: If there are no symptoms, ask: "What do you think is causing the fever?"      Unsure 5. CONTACTS: "Does anyone else in the family have an infection?"     no 6. TREATMENT: "What have you done so far to treat this fever?" (e.g., medications)     Ibuprofen. 7. IMMUNOCOMPROMISE: "Do you have of the following: diabetes, HIV positive, splenectomy, cancer chemotherapy, chronic steroid treatment, transplant patient, etc."     no  9. TRAVEL: "Have you traveled out of the country in the last month?" (e.g., travel history, exposures)    no  Protocols used: FEVER-A-AH

## 2018-12-28 NOTE — Telephone Encounter (Signed)
Please advise.  Do you want to do virtual visit?

## 2018-12-29 NOTE — Assessment & Plan Note (Signed)
The patient sounds normal on the phone.  It is possible that she contracted coronavirus from the patient.  Continue with support measures.  Call if problems

## 2018-12-29 NOTE — Telephone Encounter (Signed)
Cumulative time during 7-day interval , there was not an associated office visit for this concern within a 7 day period. Patient did provide consent for services prior to services given. Names of all persons present for services: Walker Kehr, MD,  Chief complaint: The patient was originally seen by myself on 06/02/2018 for bronchitis and treated with Z-Pak.  Her cough disappeared in a couple days.  However, she started to have fevers and chills on daily basis since 3/22 with a high fever of 102.7.  She is feeling a little better today.  Her temperature is 99.  There is no cough or shortness of breath.  No myalgias. The patient was exposed to a patient with coronavirus at work and was placed on home isolation. History, background, results pertinent: As above Past Medical History:  Diagnosis Date  . Anemia   . CAD (coronary artery disease)    no cardiologist, followed by PCP only  . Cataract   . Diverticulosis   . Generalized headaches   . GERD (gastroesophageal reflux disease)   . Glaucoma   . Hemorrhoids   . Hiatal hernia    On CT done Jan 31, 2011   . Hx of colonic polyps   . Hypertension   . Hyperthyroidism   . Osteoarthritis   . Uterine polyp   . Vitamin D deficiency    @results48 @ A/P/next steps: 1.  Febrile illness.  The patient sounds normal on the phone.  It is possible that she contracted coronavirus from the patient.  Continue with support measures.  Call if problems 2.  Hypertension with low blood pressures.  Cut back or discontinue Coreg.  Good hydration 3.  Hypokalemia.  Currently on replacement  >12 min

## 2018-12-29 NOTE — Assessment & Plan Note (Signed)
Replace potassium Cut back on Coreg or discontinue

## 2018-12-29 NOTE — Telephone Encounter (Signed)
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---her employer sent her home this past Friday to self quarantine---she continues to have slight cough, fatigue, and fever spikes at 102.7 off and on during the day---she has slight shortness of breath, but nothing critical right now---patient advised dr Alain Marion will be calling her for a telephone visit at 11:00am, we will be using her home phone number [phone]---pt advised to seek care at ED if she has difficulty breathing that cannot be managed at home--otherwise, dr plotnikov will be calling her at 11am today----routing to dr plotnikov, please call patient on her home phone number above, thanks

## 2018-12-29 NOTE — Assessment & Plan Note (Signed)
Potassium replacement therapy p.o.

## 2019-01-01 ENCOUNTER — Other Ambulatory Visit: Payer: Self-pay | Admitting: Internal Medicine

## 2019-01-01 ENCOUNTER — Ambulatory Visit: Payer: Self-pay | Admitting: *Deleted

## 2019-01-01 NOTE — Telephone Encounter (Signed)
Patient calling to report that she has been experiencing numbness in her toes and bottom of her feet for the past couple of days. Pt denies any pain and states that the numbness does not travel up her leg and denies any weakness to extremities. Pt states when the feeling occurs it only last for a few minutes and does improve with massaging her toes or moving her foot. Patient states she has experienced the numbness with sitting an lying down. Patient denies having any numbness at the time of call. Patient given home care advice bu would also like to have PCP recommendations. Pt would also like to know if it would be ok to take ibuprofen for her back pain and pt advised that if there are no contraindications to her taking ibuprofen she could use that for her back pain. Understanding verbalized. Pt can be contacted on her home number and email address in chart is correct.   Reason for Disposition . [1] Tingling in foot (e.g., pins and needles) AND [2] after prolonged sitting AND [3] brief (now gone)  Answer Assessment - Initial Assessment Questions 1. SYMPTOM: "What is the main symptom you are concerned about?" (e.g., weakness, numbness)     Numbness in toes and foot 2. ONSET: "When did this start?" (minutes, hours, days; while sleeping)     A couple of days ago 3. LAST NORMAL: "When was the last time you were normal (no symptoms)?"     A couple of days ago, last normal earlier this morning and then went away 4. PATTERN "Does this come and go, or has it been constant since it started?"  "Is it present now?"     Comes and goes 5. CARDIAC SYMPTOMS: "Have you had any of the following symptoms: chest pain, difficulty breathing, palpitations?"     No 6. NEUROLOGIC SYMPTOMS: "Have you had any of the following symptoms: headache, dizziness, vision loss, double vision, changes in speech, unsteady on your feet?"     No 7. OTHER SYMPTOMS: "Do you have any other symptoms?"     No 8. PREGNANCY: "Is there any  chance you are pregnant?" "When was your last menstrual period?"     n/a  Protocols used: NEUROLOGIC DEFICIT-A-AH

## 2019-01-01 NOTE — Telephone Encounter (Signed)
Pt offered virtual visit, she is not opposed to it but wants to wait until next week and she will give [place] a call back to set the appointment up.  She was told to take nurses advice and to call back if it gets worse.  She expressed understanding

## 2019-01-07 ENCOUNTER — Ambulatory Visit (INDEPENDENT_AMBULATORY_CARE_PROVIDER_SITE_OTHER): Payer: BLUE CROSS/BLUE SHIELD | Admitting: Internal Medicine

## 2019-01-07 ENCOUNTER — Encounter: Payer: Self-pay | Admitting: Internal Medicine

## 2019-01-07 DIAGNOSIS — R5382 Chronic fatigue, unspecified: Secondary | ICD-10-CM | POA: Diagnosis not present

## 2019-01-07 DIAGNOSIS — J069 Acute upper respiratory infection, unspecified: Secondary | ICD-10-CM

## 2019-01-07 DIAGNOSIS — I10 Essential (primary) hypertension: Secondary | ICD-10-CM | POA: Diagnosis not present

## 2019-01-07 NOTE — Assessment & Plan Note (Signed)
Continue current therapy.  Obtain labs next week

## 2019-01-07 NOTE — Progress Notes (Signed)
Virtual Visit via Telephone Note  I connected with Kelly Santos on 01/07/19 at  1:40 PM EDT by telephone and verified that I am speaking with the correct person using two identifiers.   I discussed the limitations, risks, security and privacy concerns of performing an evaluation and management service by telephone and the availability of in person appointments. I also discussed with the patient that there may be a patient responsible charge related to this service. The patient expressed understanding and agreed to proceed.   History of Present Illness: The patient is complaining of profound weakness following her viral illness.  She is doing a lot better.  There is no chest pain, headache, productive cough.  There is no shortness of breath. Follow-up decreased potassium and hypertension   Observations/Objective:  The patient is in no acute distress Assessment and Plan:  See plan Follow Up Instructions:    I discussed the assessment and treatment plan with the patient. The patient was provided an opportunity to ask questions and all were answered. The patient agreed with the plan and demonstrated an understanding of the instructions.   The patient was advised to call back or seek an in-person evaluation if the symptoms worsen or if the condition fails to improve as anticipated.  I provided 20 minutes of non-face-to-face time during this encounter.   Walker Kehr, MD

## 2019-01-07 NOTE — Assessment & Plan Note (Signed)
Post viral versus other.  Repeat lab work.  Obtain chest x-ray Rest.  Hydration to continue

## 2019-01-07 NOTE — Assessment & Plan Note (Signed)
?  COVID vs other Recovering.  Remain off work.  Labs and chest x-ray next week

## 2019-01-12 ENCOUNTER — Other Ambulatory Visit: Payer: Self-pay

## 2019-01-12 ENCOUNTER — Ambulatory Visit (INDEPENDENT_AMBULATORY_CARE_PROVIDER_SITE_OTHER)
Admission: RE | Admit: 2019-01-12 | Discharge: 2019-01-12 | Disposition: A | Discharge status: Home / Self Care | Payer: BLUE CROSS/BLUE SHIELD | Source: Ambulatory Visit | Attending: Internal Medicine | Admitting: Internal Medicine

## 2019-01-12 ENCOUNTER — Other Ambulatory Visit (INDEPENDENT_AMBULATORY_CARE_PROVIDER_SITE_OTHER): Payer: BLUE CROSS/BLUE SHIELD

## 2019-01-12 DIAGNOSIS — R350 Frequency of micturition: Secondary | ICD-10-CM

## 2019-01-12 DIAGNOSIS — R5382 Chronic fatigue, unspecified: Secondary | ICD-10-CM

## 2019-01-12 DIAGNOSIS — R079 Chest pain, unspecified: Secondary | ICD-10-CM | POA: Diagnosis not present

## 2019-01-12 DIAGNOSIS — J069 Acute upper respiratory infection, unspecified: Secondary | ICD-10-CM | POA: Diagnosis not present

## 2019-01-12 DIAGNOSIS — I10 Essential (primary) hypertension: Secondary | ICD-10-CM | POA: Diagnosis not present

## 2019-01-12 LAB — URINALYSIS, ROUTINE W REFLEX MICROSCOPIC
Bilirubin Urine: NEGATIVE
Hgb urine dipstick: NEGATIVE
Ketones, ur: NEGATIVE
Leukocytes,Ua: NEGATIVE
Nitrite: NEGATIVE
RBC / HPF: NONE SEEN (ref 0–?)
Specific Gravity, Urine: 1.01 (ref 1.000–1.030)
Total Protein, Urine: NEGATIVE
Urine Glucose: NEGATIVE
Urobilinogen, UA: 0.2 (ref 0.0–1.0)
WBC, UA: NONE SEEN (ref 0–?)
pH: 7.5 (ref 5.0–8.0)

## 2019-01-12 LAB — CBC WITH DIFFERENTIAL/PLATELET
Basophils Absolute: 0 10*3/uL (ref 0.0–0.1)
Basophils Relative: 0.6 % (ref 0.0–3.0)
Eosinophils Absolute: 0.1 10*3/uL (ref 0.0–0.7)
Eosinophils Relative: 1.7 % (ref 0.0–5.0)
HCT: 33.5 % — ABNORMAL LOW (ref 36.0–46.0)
Hemoglobin: 11.6 g/dL — ABNORMAL LOW (ref 12.0–15.0)
Lymphocytes Relative: 18.9 % (ref 12.0–46.0)
Lymphs Abs: 0.9 10*3/uL (ref 0.7–4.0)
MCHC: 34.6 g/dL (ref 30.0–36.0)
MCV: 99 fl (ref 78.0–100.0)
Monocytes Absolute: 0.5 10*3/uL (ref 0.1–1.0)
Monocytes Relative: 10.3 % (ref 3.0–12.0)
Neutro Abs: 3.3 10*3/uL (ref 1.4–7.7)
Neutrophils Relative %: 68.5 % (ref 43.0–77.0)
Platelets: 298 10*3/uL (ref 150.0–400.0)
RBC: 3.38 Mil/uL — ABNORMAL LOW (ref 3.87–5.11)
RDW: 12.9 % (ref 11.5–15.5)
WBC: 4.9 10*3/uL (ref 4.0–10.5)

## 2019-01-12 LAB — MAGNESIUM: Magnesium: 1.8 mg/dL (ref 1.5–2.5)

## 2019-01-13 ENCOUNTER — Other Ambulatory Visit (INDEPENDENT_AMBULATORY_CARE_PROVIDER_SITE_OTHER): Payer: BLUE CROSS/BLUE SHIELD

## 2019-01-13 DIAGNOSIS — I10 Essential (primary) hypertension: Secondary | ICD-10-CM

## 2019-01-13 LAB — BASIC METABOLIC PANEL
BUN: 13 mg/dL (ref 6–23)
CO2: 30 mEq/L (ref 19–32)
Calcium: 10.2 mg/dL (ref 8.4–10.5)
Chloride: 101 mEq/L (ref 96–112)
Creatinine, Ser: 0.84 mg/dL (ref 0.40–1.20)
GFR: 80.51 mL/min (ref >60.00)
Glucose, Bld: 88 mg/dL (ref 70–99)
Potassium: 4 mEq/L (ref 3.5–5.1)
Sodium: 138 mEq/L (ref 135–145)

## 2019-01-13 LAB — URINE CULTURE
MICRO NUMBER:: 394261
Result:: NO GROWTH
SPECIMEN QUALITY:: ADEQUATE

## 2019-01-27 ENCOUNTER — Telehealth: Payer: Self-pay | Admitting: Internal Medicine

## 2019-01-27 DIAGNOSIS — B349 Viral infection, unspecified: Secondary | ICD-10-CM

## 2019-01-27 NOTE — Telephone Encounter (Signed)
Please advise 

## 2019-01-27 NOTE — Telephone Encounter (Signed)
Pt called asking in regards to the COVID-19 antibody test, she had some of the symptoms previously and had self isolated for 14 days, she would like to know if she had COVID-19 and would like to know if she is a candidate for the test or if it would be a waste of her money. Please advise and call patient back

## 2019-01-27 NOTE — Telephone Encounter (Signed)
I will order the COVID antibody test.  However, I had no information on the cost of the test or the insurance coverage. Thank you

## 2019-01-27 NOTE — Telephone Encounter (Signed)
Pt.notified

## 2019-01-27 NOTE — Addendum Note (Signed)
Addended by: Cassandria Anger on: 01/27/2019 04:02 PM   Modules accepted: Orders

## 2019-01-27 NOTE — Addendum Note (Signed)
Addended by: Karren Cobble on: 01/27/2019 04:32 PM   Modules accepted: Orders

## 2019-02-24 DIAGNOSIS — Z03818 Encounter for observation for suspected exposure to other biological agents ruled out: Secondary | ICD-10-CM | POA: Diagnosis not present

## 2019-03-24 ENCOUNTER — Other Ambulatory Visit: Payer: Self-pay

## 2019-03-24 ENCOUNTER — Encounter: Payer: Self-pay | Admitting: Internal Medicine

## 2019-03-24 ENCOUNTER — Ambulatory Visit (INDEPENDENT_AMBULATORY_CARE_PROVIDER_SITE_OTHER)
Admission: RE | Admit: 2019-03-24 | Discharge: 2019-03-24 | Disposition: A | Discharge status: Home / Self Care | Payer: BC Managed Care – PPO | Source: Ambulatory Visit | Attending: Internal Medicine | Admitting: Internal Medicine

## 2019-03-24 ENCOUNTER — Other Ambulatory Visit (INDEPENDENT_AMBULATORY_CARE_PROVIDER_SITE_OTHER): Payer: BC Managed Care – PPO

## 2019-03-24 ENCOUNTER — Ambulatory Visit (INDEPENDENT_AMBULATORY_CARE_PROVIDER_SITE_OTHER): Payer: BC Managed Care – PPO | Admitting: Internal Medicine

## 2019-03-24 DIAGNOSIS — M79671 Pain in right foot: Secondary | ICD-10-CM | POA: Diagnosis not present

## 2019-03-24 DIAGNOSIS — M5442 Lumbago with sciatica, left side: Secondary | ICD-10-CM

## 2019-03-24 DIAGNOSIS — I2510 Atherosclerotic heart disease of native coronary artery without angina pectoris: Secondary | ICD-10-CM | POA: Diagnosis not present

## 2019-03-24 DIAGNOSIS — B349 Viral infection, unspecified: Secondary | ICD-10-CM | POA: Diagnosis not present

## 2019-03-24 DIAGNOSIS — E559 Vitamin D deficiency, unspecified: Secondary | ICD-10-CM | POA: Diagnosis not present

## 2019-03-24 DIAGNOSIS — G8929 Other chronic pain: Secondary | ICD-10-CM

## 2019-03-24 DIAGNOSIS — I10 Essential (primary) hypertension: Secondary | ICD-10-CM | POA: Diagnosis not present

## 2019-03-24 LAB — BASIC METABOLIC PANEL
BUN: 15 mg/dL (ref 6–23)
CO2: 29 mEq/L (ref 19–32)
Calcium: 9.5 mg/dL (ref 8.4–10.5)
Chloride: 104 mEq/L (ref 96–112)
Creatinine, Ser: 0.78 mg/dL (ref 0.40–1.20)
GFR: 87.65 mL/min (ref >60.00)
Glucose, Bld: 91 mg/dL (ref 70–99)
Potassium: 3.1 mEq/L — ABNORMAL LOW (ref 3.5–5.1)
Sodium: 140 mEq/L (ref 135–145)

## 2019-03-24 LAB — MAGNESIUM: Magnesium: 1.9 mg/dL (ref 1.5–2.5)

## 2019-03-24 MED ORDER — IBUPROFEN 600 MG PO TABS: 600.0000 mg | ORAL_TABLET | Freq: Two times a day (BID) | ORAL | 3 refills | Status: DC | PRN

## 2019-03-24 NOTE — Assessment & Plan Note (Signed)
R lat foot pain x 2 weeks Son w/DVT/PE - she is worried D dimer X ray

## 2019-03-24 NOTE — Assessment & Plan Note (Signed)
On ASA 

## 2019-03-24 NOTE — Progress Notes (Signed)
Subjective:  Patient ID: Kelly Santos, female    DOB: [birth year]  Age: 73 y.o. MRN: [id number]  CC: No chief complaint on file.   HPI Kelly Santos presents for neck and back pain  F/u HTN   C/o R lat foot pain x 2 weeks - better    Outpatient Medications Prior to Visit  Medication Sig Dispense Refill   amitriptyline (ELAVIL) 25 MG tablet Take 1 tablet (25 mg total) by mouth at bedtime as needed for sleep. Due for routine OV 30 tablet 5   Azilsartan-Chlorthalidone (EDARBYCLOR) 40-25 MG TABS Take 1 tablet by mouth every morning. 30 tablet 11   azithromycin (ZITHROMAX Z-PAK) 250 MG tablet As directed 6 tablet 0   BREO ELLIPTA 100-25 MCG/INH AEPB INHALE 1 PUFF INTO THE LUNGS EVERY DAY 60 each 5   carvedilol (COREG) 25 MG tablet Take 1 tablet (25 mg total) by mouth 2 (two) times daily. 60 tablet 11   cholecalciferol (VITAMIN D) 1000 UNITS tablet Take 1,000 Units by mouth every other day.      cloNIDine (CATAPRES) 0.1 MG tablet Take 1 tablet (0.1 mg total) by mouth 2 (two) times daily as needed. Take if BP>170/110 60 tablet 1   COMBIGAN 0.2-0.5 % ophthalmic solution INSTILL 1 DROP INTO BOTH EYES TWICE A DAY 5 mL 2   Ferrous Sulfate (IRON) 325 (65 FE) MG TABS Take 1 tablet by mouth 3 (three) times a week.      hydrocortisone (PROCTOCORT) 1 % CREA Apply 1 application topically 2 (two) times daily as needed (dry skin).      ibuprofen (ADVIL,MOTRIN) 600 MG tablet TAKE TWICE A DAY X 2 WEEKS, THEN PRN PAIN 60 tablet 3   potassium chloride SA (K-DUR,KLOR-CON) 20 MEQ tablet Take 1 tablet (20 mEq total) by mouth 2 (two) times daily. 60 tablet 11   triamcinolone cream (KENALOG) 0.5 % Apply topically 2 (two) times daily as needed. 30 g 2   valACYclovir (VALTREX) 500 MG tablet Take 1 tablet (500 mg total) by mouth 2 (two) times daily. 14 tablet 1   No facility-administered medications prior to visit.     ROS: Review of Systems  Constitutional: Negative for activity change,  appetite change, chills, fatigue and unexpected weight change.  HENT: Negative for congestion, mouth sores and sinus pressure.   Eyes: Negative for visual disturbance.  Respiratory: Negative for cough and chest tightness.   Gastrointestinal: Negative for abdominal pain and nausea.  Genitourinary: Negative for difficulty urinating, frequency and vaginal pain.  Musculoskeletal: Positive for back pain, neck pain and neck stiffness. Negative for gait problem.  Skin: Negative for pallor and rash.  Neurological: Negative for dizziness, tremors, weakness, numbness and headaches.  Psychiatric/Behavioral: Negative for confusion, sleep disturbance and suicidal ideas.    Objective:  BP 136/88 (BP Location: Left Arm, Patient Position: Sitting, Cuff Size: Normal)    Pulse 69    Temp 98.4 F (36.9 C) (Oral)    Ht 5\' 3"  (1.6 m)    Wt 168 lb (76.2 kg)    SpO2 98%    BMI 29.76 kg/m   BP Readings from Last 3 Encounters:  03/24/19 136/88  12/17/18 (!) 142/90  07/23/18 110/70    Wt Readings from Last 3 Encounters:  03/24/19 168 lb (76.2 kg)  12/17/18 174 lb (78.9 kg)  07/23/18 171 lb 6.4 oz (77.7 kg)    Physical Exam Constitutional:      General: She is not in acute distress.  Appearance: She is well-developed.  HENT:     Head: Normocephalic.     Right Ear: External ear normal.     Left Ear: External ear normal.     Nose: Nose normal.  Eyes:     General:        Right eye: No discharge.        Left eye: No discharge.     Conjunctiva/sclera: Conjunctivae normal.     Pupils: Pupils are equal, round, and reactive to light.  Neck:     Musculoskeletal: Normal range of motion and neck supple.     Thyroid: No thyromegaly.     Vascular: No JVD.     Trachea: No tracheal deviation.  Cardiovascular:     Rate and Rhythm: Normal rate and regular rhythm.     Heart sounds: Normal heart sounds.  Pulmonary:     Effort: No respiratory distress.     Breath sounds: No stridor. No wheezing.    Abdominal:     General: Bowel sounds are normal. There is no distension.     Palpations: Abdomen is soft. There is no mass.     Tenderness: There is no abdominal tenderness. There is no guarding or rebound.  Musculoskeletal:        General: No tenderness.  Lymphadenopathy:     Cervical: No cervical adenopathy.  Skin:    Findings: No erythema or rash.  Neurological:     Cranial Nerves: No cranial nerve deficit.     Motor: No abnormal muscle tone.     Coordination: Coordination normal.     Deep Tendon Reflexes: Reflexes normal.  Psychiatric:        Behavior: Behavior normal.        Thought Content: Thought content normal.        Judgment: Judgment normal.   R lat foot pain   Lab Results  Component Value Date   WBC 4.9 01/12/2019   HGB 11.6 (L) 01/12/2019   HCT 33.5 (L) 01/12/2019   PLT 298.0 01/12/2019   GLUCOSE 88 01/13/2019   CHOL 151 05/22/2018   TRIG 112.0 05/22/2018   HDL 48.00 05/22/2018   LDLCALC 80 05/22/2018   ALT 20 12/17/2018   AST 18 12/17/2018   NA 138 01/13/2019   K 4.0 01/13/2019   CL 101 01/13/2019   CREATININE 0.84 01/13/2019   BUN 13 01/13/2019   CO2 30 01/13/2019   TSH 0.81 12/17/2018   INR 0.96 05/09/2011   HGBA1C 5.8 (H) 01/23/2017    Dg Chest 2 View  Result Date: 01/12/2019 CLINICAL DATA:  73 year old female with chest pain EXAM: CHEST - 2 VIEW COMPARISON:  12/17/2018, 07/08/2018, CT 07/30/2018 FINDINGS: Cardiomediastinal silhouette unchanged in size and contour. No evidence of central vascular congestion. No pneumothorax or pleural effusion. No confluent airspace disease. No displaced fracture IMPRESSION: Negative for acute cardiopulmonary disease. Electronically Signed   By: Corrie Mckusick D.O.   On: 01/12/2019 11:17    Assessment & Plan:   There are no diagnoses linked to this encounter.   No orders of the defined types were placed in this encounter.    Follow-up: No follow-ups on file.  Walker Kehr, MD

## 2019-03-24 NOTE — Assessment & Plan Note (Signed)
Ibuprofen prn 

## 2019-03-24 NOTE — Assessment & Plan Note (Signed)
Vit D 

## 2019-03-24 NOTE — Assessment & Plan Note (Addendum)
Clonidine, Coreg, [facility]

## 2019-03-25 ENCOUNTER — Other Ambulatory Visit: Payer: Self-pay | Admitting: Internal Medicine

## 2019-03-25 DIAGNOSIS — M79671 Pain in right foot: Secondary | ICD-10-CM

## 2019-03-25 LAB — SAR COV2 SEROLOGY (COVID19)AB(IGG),IA: SARS CoV2 AB IGG: POSITIVE — AB

## 2019-03-25 LAB — D-DIMER, QUANTITATIVE: D-Dimer, Quant: 0.7 mcg/mL FEU — ABNORMAL HIGH (ref <0.50)

## 2019-03-29 ENCOUNTER — Telehealth: Payer: Self-pay

## 2019-03-29 ENCOUNTER — Telehealth (HOSPITAL_COMMUNITY): Payer: Self-pay | Admitting: *Deleted

## 2019-03-29 NOTE — Telephone Encounter (Signed)
Copied from [place] [id number]. Topic: General - Other >> Mar 26, 2019  2:43 PM Leward Quan A wrote: Reason for CRM: [facility] called to inform Dr Alain Marion that they have not been able to contact patient in order to get her in for testing.

## 2019-03-29 NOTE — Telephone Encounter (Signed)
The above patient or their representative was contacted and gave the following answers to these questions:         Do you have any of the following symptoms?  no  Fever                    Cough                   Shortness of breath  Do  you have any of the following other symptoms? no   muscle pain         vomiting,        diarrhea        rash         weakness        red eye        abdominal pain         bruising          bruising or bleeding              joint pain           severe headache    Have you been in contact with someone who was or has been sick in the past 2 weeks?  no  Yes                 Unsure                         Unable to assess   Does the person that you were in contact with have any of the following symptoms?   Cough         shortness of breath           muscle pain         vomiting,            diarrhea            rash            weakness           fever            red eye           abdominal pain           bruising  or  bleeding                joint pain                severe headache               Have you  or someone you have been in contact with traveled internationally in th last month?         If yes, which countries? no  Have you  or someone you have been in contact with traveled outside [place] in th last month?      no   If yes, which state and city?   COMMENTS OR ACTION PLAN FOR THIS PATIENT:

## 2019-03-31 ENCOUNTER — Ambulatory Visit (HOSPITAL_COMMUNITY)
Admission: RE | Admit: 2019-03-31 | Discharge: 2019-03-31 | Disposition: A | Discharge status: Home / Self Care | Payer: BC Managed Care – PPO | Source: Ambulatory Visit | Attending: Family | Admitting: Family

## 2019-03-31 ENCOUNTER — Other Ambulatory Visit: Payer: Self-pay

## 2019-03-31 DIAGNOSIS — M79671 Pain in right foot: Secondary | ICD-10-CM | POA: Insufficient documentation

## 2019-04-07 DIAGNOSIS — H401122 Primary open-angle glaucoma, left eye, moderate stage: Secondary | ICD-10-CM | POA: Diagnosis not present

## 2019-04-07 DIAGNOSIS — H35373 Puckering of macula, bilateral: Secondary | ICD-10-CM | POA: Diagnosis not present

## 2019-04-07 DIAGNOSIS — H401111 Primary open-angle glaucoma, right eye, mild stage: Secondary | ICD-10-CM | POA: Diagnosis not present

## 2019-06-01 ENCOUNTER — Other Ambulatory Visit: Payer: Self-pay | Admitting: Internal Medicine

## 2019-06-08 ENCOUNTER — Telehealth: Payer: Self-pay | Admitting: *Deleted

## 2019-06-08 NOTE — Telephone Encounter (Signed)
Called to initiate PA for Edarbyclor 40-25 mg. Need to initiate via  [email or web address]  Pt ID: [id number]

## 2019-06-08 NOTE — Telephone Encounter (Signed)
PA initiated via website below Prior Auth ([place]) ID: [id number]

## 2019-07-10 ENCOUNTER — Other Ambulatory Visit: Payer: Self-pay | Admitting: Internal Medicine

## 2019-07-28 DIAGNOSIS — H401111 Primary open-angle glaucoma, right eye, mild stage: Secondary | ICD-10-CM | POA: Diagnosis not present

## 2019-07-28 DIAGNOSIS — H401122 Primary open-angle glaucoma, left eye, moderate stage: Secondary | ICD-10-CM | POA: Diagnosis not present

## 2019-08-25 ENCOUNTER — Other Ambulatory Visit (INDEPENDENT_AMBULATORY_CARE_PROVIDER_SITE_OTHER): Payer: BC Managed Care – PPO

## 2019-08-25 ENCOUNTER — Encounter: Payer: Self-pay | Admitting: Internal Medicine

## 2019-08-25 ENCOUNTER — Other Ambulatory Visit: Payer: Self-pay

## 2019-08-25 ENCOUNTER — Ambulatory Visit (INDEPENDENT_AMBULATORY_CARE_PROVIDER_SITE_OTHER): Payer: BC Managed Care – PPO | Admitting: Internal Medicine

## 2019-08-25 VITALS — BP 136/88 | HR 69 | Temp 98.4°F | Ht 63.0 in | Wt 172.0 lb

## 2019-08-25 DIAGNOSIS — R739 Hyperglycemia, unspecified: Secondary | ICD-10-CM | POA: Diagnosis not present

## 2019-08-25 DIAGNOSIS — G4452 New daily persistent headache (NDPH): Secondary | ICD-10-CM | POA: Insufficient documentation

## 2019-08-25 DIAGNOSIS — E559 Vitamin D deficiency, unspecified: Secondary | ICD-10-CM

## 2019-08-25 DIAGNOSIS — R202 Paresthesia of skin: Secondary | ICD-10-CM

## 2019-08-25 DIAGNOSIS — I10 Essential (primary) hypertension: Secondary | ICD-10-CM

## 2019-08-25 LAB — URINALYSIS
Bilirubin Urine: NEGATIVE
Hgb urine dipstick: NEGATIVE
Ketones, ur: NEGATIVE
Leukocytes,Ua: NEGATIVE
Nitrite: NEGATIVE
Specific Gravity, Urine: 1.01 (ref 1.000–1.030)
Total Protein, Urine: NEGATIVE
Urine Glucose: NEGATIVE
Urobilinogen, UA: 0.2 (ref 0.0–1.0)
pH: 7 (ref 5.0–8.0)

## 2019-08-25 LAB — HEPATIC FUNCTION PANEL
ALT: 19 U/L (ref 0–35)
AST: 21 U/L (ref 0–37)
Albumin: 3.8 g/dL (ref 3.5–5.2)
Alkaline Phosphatase: 52 U/L (ref 39–117)
Bilirubin, Direct: 0.1 mg/dL (ref 0.0–0.3)
Total Bilirubin: 0.5 mg/dL (ref 0.2–1.2)
Total Protein: 7.8 g/dL (ref 6.0–8.3)

## 2019-08-25 LAB — BASIC METABOLIC PANEL
BUN: 14 mg/dL (ref 6–23)
CO2: 31 mEq/L (ref 19–32)
Calcium: 10.1 mg/dL (ref 8.4–10.5)
Chloride: 100 mEq/L (ref 96–112)
Creatinine, Ser: 0.75 mg/dL (ref 0.40–1.20)
GFR: 91.6 mL/min (ref >60.00)
Glucose, Bld: 110 mg/dL — ABNORMAL HIGH (ref 70–99)
Potassium: 3.3 mEq/L — ABNORMAL LOW (ref 3.5–5.1)
Sodium: 137 mEq/L (ref 135–145)

## 2019-08-25 LAB — SEDIMENTATION RATE: Sed Rate: 17 mm/hr (ref 0–30)

## 2019-08-25 LAB — VITAMIN B12: Vitamin B-12: 848 pg/mL (ref 211–911)

## 2019-08-25 LAB — LIPID PANEL
Cholesterol: 174 mg/dL (ref 0–200)
HDL: 46.8 mg/dL (ref >39.00)
LDL Cholesterol: 103 mg/dL — ABNORMAL HIGH (ref 0–99)
NonHDL: 127.26
Total CHOL/HDL Ratio: 4
Triglycerides: 121 mg/dL (ref 0.0–149.0)
VLDL: 24.2 mg/dL (ref 0.0–40.0)

## 2019-08-25 LAB — TSH: TSH: 0.59 u[IU]/mL (ref 0.35–4.50)

## 2019-08-25 LAB — VITAMIN D 25 HYDROXY (VIT D DEFICIENCY, FRACTURES): VITD: 38.03 ng/mL (ref 30.00–100.00)

## 2019-08-25 MED ORDER — CEFDINIR 300 MG PO CAPS: 300.0000 mg | ORAL_CAPSULE | Freq: Two times a day (BID) | ORAL | 0 refills | Status: DC

## 2019-08-25 NOTE — Assessment & Plan Note (Signed)
Labs

## 2019-08-25 NOTE — Assessment & Plan Note (Signed)
BMET 

## 2019-08-25 NOTE — Assessment & Plan Note (Signed)
CT head, ESR Labs Ibuprofen Empiric Omnicef

## 2019-08-25 NOTE — Assessment & Plan Note (Signed)
ESR B12

## 2019-08-25 NOTE — Assessment & Plan Note (Signed)
Vit D Labs 

## 2019-08-25 NOTE — Progress Notes (Signed)
Subjective:  Patient ID: Kelly Santos, female    DOB: January 12, 1946  Age: 73 y.o. MRN: [id number]  CC: No chief complaint on file.   HPI Kelly Santos presents for R HA and swelling x 2 mo off and on  - recurrent since many years ago Pt wants blood work Pain to touch No rash     Outpatient Medications Prior to Visit  Medication Sig Dispense Refill   amitriptyline (ELAVIL) 25 MG tablet Take 1 tablet (25 mg total) by mouth at bedtime as needed for sleep. Due for routine OV 30 tablet 5   BREO ELLIPTA 100-25 MCG/INH AEPB INHALE 1 PUFF INTO THE LUNGS EVERY DAY 3 each 3   cholecalciferol (VITAMIN D) 1000 UNITS tablet Take 1,000 Units by mouth every other day.      cloNIDine (CATAPRES) 0.1 MG tablet Take 1 tablet (0.1 mg total) by mouth 2 (two) times daily as needed. Take if BP>170/110 60 tablet 1   COMBIGAN 0.2-0.5 % ophthalmic solution INSTILL 1 DROP INTO BOTH EYES TWICE A DAY 5 mL 2   EDARBYCLOR 40-25 MG TABS TAKE 1 TABLET BY MOUTH EVERY DAY IN THE MORNING 30 tablet 11   Ferrous Sulfate (IRON) 325 (65 FE) MG TABS Take 1 tablet by mouth 3 (three) times a week.      hydrocortisone (PROCTOCORT) 1 % CREA Apply 1 application topically 2 (two) times daily as needed (dry skin).      ibuprofen (ADVIL) 600 MG tablet Take 1 tablet (600 mg total) by mouth 2 (two) times daily as needed for moderate pain. 60 tablet 3   potassium chloride SA (K-DUR,KLOR-CON) 20 MEQ tablet Take 1 tablet (20 mEq total) by mouth 2 (two) times daily. 60 tablet 11   triamcinolone cream (KENALOG) 0.5 % Apply topically 2 (two) times daily as needed. 30 g 2   valACYclovir (VALTREX) 500 MG tablet Take 1 tablet (500 mg total) by mouth 2 (two) times daily. 14 tablet 1   carvedilol (COREG) 25 MG tablet Take 1 tablet (25 mg total) by mouth 2 (two) times daily. 60 tablet 11   azithromycin (ZITHROMAX Z-PAK) 250 MG tablet As directed 6 tablet 0   No facility-administered medications prior to visit.     ROS: Review of  Systems  Constitutional: Negative for activity change, appetite change, chills, fatigue and unexpected weight change.  HENT: Positive for dental problem and facial swelling. Negative for congestion, mouth sores, rhinorrhea, sinus pressure, sore throat and voice change.   Eyes: Negative for photophobia, pain, redness and visual disturbance.  Respiratory: Negative for cough and chest tightness.   Gastrointestinal: Negative for abdominal pain and nausea.  Genitourinary: Negative for difficulty urinating, frequency and vaginal pain.  Musculoskeletal: Negative for back pain and gait problem.  Skin: Negative for pallor and rash.  Neurological: Negative for dizziness, tremors, weakness, numbness and headaches.  Psychiatric/Behavioral: Negative for confusion, sleep disturbance and suicidal ideas.    Objective:  BP 136/88 (BP Location: Left Arm, Patient Position: Sitting, Cuff Size: Normal)    Pulse 69    Temp 98.4 F (36.9 C) (Oral)    Ht 5\' 3"  (1.6 m)    Wt 172 lb (78 kg)    SpO2 98%    BMI 30.47 kg/m   BP Readings from Last 3 Encounters:  08/25/19 136/88  03/24/19 136/88  12/17/18 (!) 142/90    Wt Readings from Last 3 Encounters:  08/25/19 172 lb (78 kg)  03/24/19 168 lb (76.2 kg)  12/17/18 174 lb (78.9 kg)    Physical Exam Constitutional:      General: She is not in acute distress.    Appearance: She is well-developed.  HENT:     Head: Normocephalic.     Right Ear: External ear normal.     Left Ear: External ear normal.     Nose: Nose normal.  Eyes:     General:        Right eye: No discharge.        Left eye: No discharge.     Conjunctiva/sclera: Conjunctivae normal.     Pupils: Pupils are equal, round, and reactive to light.  Neck:     Musculoskeletal: Normal range of motion and neck supple.     Thyroid: No thyromegaly.     Vascular: No JVD.     Trachea: No tracheal deviation.  Cardiovascular:     Rate and Rhythm: Normal rate and regular rhythm.     Heart sounds:  Normal heart sounds.  Pulmonary:     Effort: No respiratory distress.     Breath sounds: No stridor. No wheezing.  Abdominal:     General: Bowel sounds are normal. There is no distension.     Palpations: Abdomen is soft. There is no mass.     Tenderness: There is no abdominal tenderness. There is no guarding or rebound.  Musculoskeletal:        General: No tenderness.  Lymphadenopathy:     Cervical: No cervical adenopathy.  Skin:    Findings: No erythema or rash.  Neurological:     Cranial Nerves: No cranial nerve deficit.     Motor: No abnormal muscle tone.     Coordination: Coordination normal.     Deep Tendon Reflexes: Reflexes normal.  Psychiatric:        Behavior: Behavior normal.        Thought Content: Thought content normal.        Judgment: Judgment normal.   no pulsating vessels on temples Face, head NT  Lab Results  Component Value Date   WBC 4.9 01/12/2019   HGB 11.6 (L) 01/12/2019   HCT 33.5 (L) 01/12/2019   PLT 298.0 01/12/2019   GLUCOSE 91 03/24/2019   CHOL 151 05/22/2018   TRIG 112.0 05/22/2018   HDL 48.00 05/22/2018   LDLCALC 80 05/22/2018   ALT 20 12/17/2018   AST 18 12/17/2018   NA 140 03/24/2019   K 3.1 (L) 03/24/2019   CL 104 03/24/2019   CREATININE 0.78 03/24/2019   BUN 15 03/24/2019   CO2 29 03/24/2019   TSH 0.81 12/17/2018   INR 0.96 05/09/2011   HGBA1C 5.8 (H) 01/23/2017    Vas [place] Lower Extremity Venous (dvt)  Result Date: 03/31/2019  Lower Venous Study Indications: Pain, and Swelling.  Performing Technologist: Ralene Cork RVT  Examination Guidelines: A complete evaluation includes B-mode imaging, spectral Doppler, color Doppler, and power Doppler as needed of all accessible portions of each vessel. Bilateral testing is considered an integral part of a complete examination. Limited examinations for reoccurring indications may be performed as noted.  +---------+---------------+---------+-----------+----------+-------+  RIGHT      Compressibility Phasicity Spontaneity Properties Summary  +---------+---------------+---------+-----------+----------+-------+  CFV       Full            Yes       Yes                             +---------+---------------+---------+-----------+----------+-------+  SFJ       Full                      Yes                             +---------+---------------+---------+-----------+----------+-------+  FV Prox   Full            Yes       Yes                             +---------+---------------+---------+-----------+----------+-------+  FV Mid    Full            Yes       Yes                             +---------+---------------+---------+-----------+----------+-------+  FV Distal Full            Yes       Yes                             +---------+---------------+---------+-----------+----------+-------+  POP       Full            Yes       Yes                             +---------+---------------+---------+-----------+----------+-------+  PTV       Full                      Yes                             +---------+---------------+---------+-----------+----------+-------+  PERO      Full                      Yes                             +---------+---------------+---------+-----------+----------+-------+  GSV       Full            Yes       Yes                             +---------+---------------+---------+-----------+----------+-------+  Findings reported to waited on hold/disconnected at 11:25am.  Summary: Right: There is no evidence of deep vein thrombosis in the lower extremity.There is no evidence of superficial venous thrombosis.  *See table(s) above for measurements and observations. Electronically signed by Deitra Mayo MD on 03/31/2019 at 1:25:37 PM.    Final     Assessment & Plan:   There are no diagnoses linked to this encounter.   No orders of the defined types were placed in this encounter.    Follow-up: No follow-ups on file.  Walker Kehr, MD

## 2019-08-31 ENCOUNTER — Other Ambulatory Visit: Payer: BC Managed Care – PPO

## 2019-09-01 ENCOUNTER — Telehealth: Payer: Self-pay | Admitting: *Deleted

## 2019-09-01 ENCOUNTER — Inpatient Hospital Stay: Admission: RE | Admit: 2019-09-01 | Payer: BC Managed Care – PPO | Source: Ambulatory Visit

## 2019-09-01 NOTE — Telephone Encounter (Signed)
Patient was here for CT, she stated that someone on her job had tested positive for Covid 19 on this past Saturday or Monday. Patient stated that she was tested on yesterday and she will receive her results from [facility] [place] testing today. I spoke with Erline Levine and pt was advised that appointment for today will be cancelled and when she receives her results she can call our office to reschedule. Patient stated ok, I thanked her for the information.

## 2019-11-08 DIAGNOSIS — Z13820 Encounter for screening for osteoporosis: Secondary | ICD-10-CM | POA: Diagnosis not present

## 2019-11-08 DIAGNOSIS — Z01419 Encounter for gynecological examination (general) (routine) without abnormal findings: Secondary | ICD-10-CM | POA: Diagnosis not present

## 2019-11-08 DIAGNOSIS — Z1231 Encounter for screening mammogram for malignant neoplasm of breast: Secondary | ICD-10-CM | POA: Diagnosis not present

## 2019-12-25 ENCOUNTER — Other Ambulatory Visit: Payer: Self-pay | Admitting: Internal Medicine

## 2020-01-26 ENCOUNTER — Ambulatory Visit: Payer: BC Managed Care – PPO | Admitting: Internal Medicine

## 2020-01-26 ENCOUNTER — Encounter: Payer: Self-pay | Admitting: Internal Medicine

## 2020-01-26 ENCOUNTER — Other Ambulatory Visit: Payer: Self-pay

## 2020-01-26 DIAGNOSIS — I2510 Atherosclerotic heart disease of native coronary artery without angina pectoris: Secondary | ICD-10-CM

## 2020-01-26 DIAGNOSIS — I10 Essential (primary) hypertension: Secondary | ICD-10-CM | POA: Diagnosis not present

## 2020-01-26 DIAGNOSIS — R519 Headache, unspecified: Secondary | ICD-10-CM

## 2020-01-26 DIAGNOSIS — R1013 Epigastric pain: Secondary | ICD-10-CM

## 2020-01-26 DIAGNOSIS — R0789 Other chest pain: Secondary | ICD-10-CM

## 2020-01-26 LAB — BASIC METABOLIC PANEL
BUN: 14 mg/dL (ref 6–23)
CO2: 33 mEq/L — ABNORMAL HIGH (ref 19–32)
Calcium: 9.8 mg/dL (ref 8.4–10.5)
Chloride: 103 mEq/L (ref 96–112)
Creatinine, Ser: 0.74 mg/dL (ref 0.40–1.20)
GFR: 92.92 mL/min (ref >60.00)
Glucose, Bld: 115 mg/dL — ABNORMAL HIGH (ref 70–99)
Potassium: 3.4 mEq/L — ABNORMAL LOW (ref 3.5–5.1)
Sodium: 140 mEq/L (ref 135–145)

## 2020-01-26 LAB — HEPATIC FUNCTION PANEL
ALT: 25 U/L (ref 0–35)
AST: 23 U/L (ref 0–37)
Albumin: 4 g/dL (ref 3.5–5.2)
Alkaline Phosphatase: 49 U/L (ref 39–117)
Bilirubin, Direct: 0.1 mg/dL (ref 0.0–0.3)
Total Bilirubin: 0.5 mg/dL (ref 0.2–1.2)
Total Protein: 7.5 g/dL (ref 6.0–8.3)

## 2020-01-26 LAB — URINALYSIS
Bilirubin Urine: NEGATIVE
Hgb urine dipstick: NEGATIVE
Ketones, ur: NEGATIVE
Leukocytes,Ua: NEGATIVE
Nitrite: NEGATIVE
Specific Gravity, Urine: 1.015 (ref 1.000–1.030)
Total Protein, Urine: NEGATIVE
Urine Glucose: NEGATIVE
Urobilinogen, UA: 0.2 (ref 0.0–1.0)
pH: 7 (ref 5.0–8.0)

## 2020-01-26 LAB — TSH: TSH: 0.65 u[IU]/mL (ref 0.35–4.50)

## 2020-01-26 LAB — H. PYLORI ANTIBODY, IGG: H Pylori IgG: NEGATIVE

## 2020-01-26 MED ORDER — OLMESARTAN MEDOXOMIL-HCTZ 40-25 MG PO TABS: 1.0000 | ORAL_TABLET | Freq: Every day | ORAL | 3 refills | Status: DC

## 2020-01-26 NOTE — Progress Notes (Signed)
Subjective:  Patient ID: Kelly Santos, female    DOB: Sep 03, 1946  Age: 74 y.o. MRN: [id number]  CC: No chief complaint on file.   HPI Annalicia T Santos presents for GERD after eating pasta C/o epigastric pains, discomfort - worse w/bending over C/o ankle swelling at times F/u HTN  Outpatient Medications Prior to Visit  Medication Sig Dispense Refill  . amitriptyline (ELAVIL) 25 MG tablet Take 1 tablet (25 mg total) by mouth at bedtime as needed for sleep. Due for routine OV 30 tablet 5  . BREO ELLIPTA 100-25 MCG/INH AEPB INHALE 1 PUFF INTO THE LUNGS EVERY DAY 3 each 3  . cholecalciferol (VITAMIN D) 1000 UNITS tablet Take 1,000 Units by mouth every other day.     . cloNIDine (CATAPRES) 0.1 MG tablet Take 1 tablet (0.1 mg total) by mouth 2 (two) times daily as needed. Take if BP>170/110 60 tablet 1  . COMBIGAN 0.2-0.5 % ophthalmic solution INSTILL 1 DROP INTO BOTH EYES TWICE A DAY 5 mL 2  . EDARBYCLOR 40-25 MG TABS TAKE 1 TABLET BY MOUTH EVERY DAY IN THE MORNING 30 tablet 11  . Ferrous Sulfate (IRON) 325 (65 FE) MG TABS Take 1 tablet by mouth 3 (three) times a week.     . hydrocortisone (PROCTOCORT) 1 % CREA Apply 1 application topically 2 (two) times daily as needed (dry skin).     Marland Kitchen ibuprofen (ADVIL) 600 MG tablet Take 1 tablet (600 mg total) by mouth 2 (two) times daily as needed for moderate pain. 60 tablet 3  . KLOR-CON M20 20 MEQ tablet TAKE 1 TABLET BY MOUTH TWICE A DAY 60 tablet 11  . triamcinolone cream (KENALOG) 0.5 % Apply topically 2 (two) times daily as needed. 30 g 2  . valACYclovir (VALTREX) 500 MG tablet Take 1 tablet (500 mg total) by mouth 2 (two) times daily. 14 tablet 1  . carvedilol (COREG) 25 MG tablet Take 1 tablet (25 mg total) by mouth 2 (two) times daily. 60 tablet 11  . cefdinir (OMNICEF) 300 MG capsule Take 1 capsule (300 mg total) by mouth 2 (two) times daily. 20 capsule 0   No facility-administered medications prior to visit.    ROS: Review of Systems    Constitutional: Negative for activity change, appetite change, chills, fatigue and unexpected weight change.  HENT: Negative for congestion, mouth sores and sinus pressure.   Eyes: Negative for visual disturbance.  Respiratory: Negative for cough and chest tightness.   Gastrointestinal: Positive for abdominal pain. Negative for abdominal distention and nausea.  Genitourinary: Negative for difficulty urinating, frequency and vaginal pain.  Musculoskeletal: Negative for back pain and gait problem.  Skin: Negative for pallor and rash.  Neurological: Negative for dizziness, tremors, weakness, numbness and headaches.  Psychiatric/Behavioral: Negative for confusion and sleep disturbance.    Objective:  BP (!) 142/90 (BP Location: Left Arm, Patient Position: Sitting, Cuff Size: Large)   Pulse 65   Temp 98.5 F (36.9 C) (Oral)   Ht 5\' 3"  (1.6 m)   Wt 173 lb (78.5 kg)   SpO2 98%   BMI 30.65 kg/m   BP Readings from Last 3 Encounters:  01/26/20 (!) 142/90  08/25/19 136/88  03/24/19 136/88    Wt Readings from Last 3 Encounters:  01/26/20 173 lb (78.5 kg)  08/25/19 172 lb (78 kg)  03/24/19 168 lb (76.2 kg)    Physical Exam Constitutional:      General: She is not in acute distress.  Appearance: She is well-developed.  HENT:     Head: Normocephalic.     Right Ear: External ear normal.     Left Ear: External ear normal.     Nose: Nose normal.  Eyes:     General:        Right eye: No discharge.        Left eye: No discharge.     Conjunctiva/sclera: Conjunctivae normal.     Pupils: Pupils are equal, round, and reactive to light.  Neck:     Thyroid: No thyromegaly.     Vascular: No JVD.     Trachea: No tracheal deviation.  Cardiovascular:     Rate and Rhythm: Normal rate and regular rhythm.     Heart sounds: Normal heart sounds.  Pulmonary:     Effort: No respiratory distress.     Breath sounds: No stridor. No wheezing.  Abdominal:     General: Bowel sounds are normal.  There is no distension.     Palpations: Abdomen is soft. There is no mass.     Tenderness: There is no abdominal tenderness. There is no guarding or rebound.  Musculoskeletal:        General: No tenderness.     Cervical back: Normal range of motion and neck supple.  Lymphadenopathy:     Cervical: No cervical adenopathy.  Skin:    Findings: No erythema or rash.  Neurological:     Cranial Nerves: No cranial nerve deficit.     Motor: No abnormal muscle tone.     Coordination: Coordination normal.     Deep Tendon Reflexes: Reflexes normal.  Psychiatric:        Behavior: Behavior normal.        Thought Content: Thought content normal.        Judgment: Judgment normal.     Lab Results  Component Value Date   WBC 4.9 01/12/2019   HGB 11.6 (L) 01/12/2019   HCT 33.5 (L) 01/12/2019   PLT 298.0 01/12/2019   GLUCOSE 110 (H) 08/25/2019   CHOL 174 08/25/2019   TRIG 121.0 08/25/2019   HDL 46.80 08/25/2019   LDLCALC 103 (H) 08/25/2019   ALT 19 08/25/2019   AST 21 08/25/2019   NA 137 08/25/2019   K 3.3 (L) 08/25/2019   CL 100 08/25/2019   CREATININE 0.75 08/25/2019   BUN 14 08/25/2019   CO2 31 08/25/2019   TSH 0.59 08/25/2019   INR 0.96 05/09/2011   HGBA1C 5.8 (H) 01/23/2017    VAS [place] LOWER EXTREMITY VENOUS (DVT)  Result Date: 03/31/2019  Lower Venous Study Indications: Pain, and Swelling.  Performing Technologist: Ralene Cork RVT  Examination Guidelines: A complete evaluation includes B-mode imaging, spectral Doppler, color Doppler, and power Doppler as needed of all accessible portions of each vessel. Bilateral testing is considered an integral part of a complete examination. Limited examinations for reoccurring indications may be performed as noted.  +---------+---------------+---------+-----------+----------+-------+ RIGHT    CompressibilityPhasicitySpontaneityPropertiesSummary +---------+---------------+---------+-----------+----------+-------+ CFV      Full            Yes      Yes                          +---------+---------------+---------+-----------+----------+-------+ SFJ      Full                    Yes                          +---------+---------------+---------+-----------+----------+-------+  FV Prox  Full           Yes      Yes                          +---------+---------------+---------+-----------+----------+-------+ FV Mid   Full           Yes      Yes                          +---------+---------------+---------+-----------+----------+-------+ FV DistalFull           Yes      Yes                          +---------+---------------+---------+-----------+----------+-------+ POP      Full           Yes      Yes                          +---------+---------------+---------+-----------+----------+-------+ PTV      Full                    Yes                          +---------+---------------+---------+-----------+----------+-------+ PERO     Full                    Yes                          +---------+---------------+---------+-----------+----------+-------+ GSV      Full           Yes      Yes                          +---------+---------------+---------+-----------+----------+-------+  Findings reported to waited on hold/disconnected at 11:25am.  Summary: Right: There is no evidence of deep vein thrombosis in the lower extremity.There is no evidence of superficial venous thrombosis.  *See table(s) above for measurements and observations. Electronically signed by Deitra Mayo MD on 03/31/2019 at 1:25:37 PM.    Final     Assessment & Plan:    Walker Kehr, MD

## 2020-01-26 NOTE — Assessment & Plan Note (Signed)
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

## 2020-01-26 NOTE — Assessment & Plan Note (Signed)
Resolved

## 2020-01-26 NOTE — Assessment & Plan Note (Signed)
A cardiac CT scan for calcium scoring  Offered 4/21

## 2020-01-26 NOTE — Patient Instructions (Signed)

## 2020-01-26 NOTE — Assessment & Plan Note (Addendum)
Chronic ?etiology R/o HH - use a PPI prn . A cardiac CT scan for calcium scoring offered

## 2020-01-26 NOTE — Assessment & Plan Note (Addendum)
A cardiac CT scan for calcium scoring offered 

## 2020-01-27 LAB — CBC WITH DIFFERENTIAL/PLATELET
Basophils Absolute: 0 10*3/uL (ref 0.0–0.1)
Basophils Relative: 0.7 % (ref 0.0–3.0)
Eosinophils Absolute: 0.2 10*3/uL (ref 0.0–0.7)
Eosinophils Relative: 5.2 % — ABNORMAL HIGH (ref 0.0–5.0)
HCT: 38.4 % (ref 36.0–46.0)
Hemoglobin: 12.9 g/dL (ref 12.0–15.0)
Lymphocytes Relative: 25 % (ref 12.0–46.0)
Lymphs Abs: 1 10*3/uL (ref 0.7–4.0)
MCHC: 33.6 g/dL (ref 30.0–36.0)
MCV: 100.8 fl — ABNORMAL HIGH (ref 78.0–100.0)
Monocytes Absolute: 0.3 10*3/uL (ref 0.1–1.0)
Monocytes Relative: 7.8 % (ref 3.0–12.0)
Neutro Abs: 2.4 10*3/uL (ref 1.4–7.7)
Neutrophils Relative %: 61.3 % (ref 43.0–77.0)
Platelets: 192 10*3/uL (ref 150.0–400.0)
RBC: 3.81 Mil/uL — ABNORMAL LOW (ref 3.87–5.11)
RDW: 13 % (ref 11.5–15.5)
WBC: 3.9 10*3/uL — ABNORMAL LOW (ref 4.0–10.5)

## 2020-02-11 ENCOUNTER — Telehealth: Payer: Self-pay | Admitting: Internal Medicine

## 2020-02-11 NOTE — Telephone Encounter (Signed)
   Patient calling to advise Dr Alain Marion she has decided to have  CT calcium scoring

## 2020-02-14 NOTE — Telephone Encounter (Signed)
New message:    Pt is calling and states she would like to have the CT calcium scoring test done. Please advise.

## 2020-02-23 DIAGNOSIS — Z1159 Encounter for screening for other viral diseases: Secondary | ICD-10-CM | POA: Diagnosis not present

## 2020-02-23 DIAGNOSIS — Z20828 Contact with and (suspected) exposure to other viral communicable diseases: Secondary | ICD-10-CM | POA: Diagnosis not present

## 2020-03-06 ENCOUNTER — Other Ambulatory Visit: Payer: BC Managed Care – PPO

## 2020-03-06 ENCOUNTER — Encounter: Payer: Self-pay | Admitting: Family

## 2020-03-06 ENCOUNTER — Ambulatory Visit (INDEPENDENT_AMBULATORY_CARE_PROVIDER_SITE_OTHER)
Admission: RE | Admit: 2020-03-06 | Discharge: 2020-03-06 | Disposition: A | Discharge status: Home / Self Care | Payer: BC Managed Care – PPO | Source: Ambulatory Visit | Attending: Family | Admitting: Family

## 2020-03-06 ENCOUNTER — Ambulatory Visit: Payer: BC Managed Care – PPO | Admitting: Family

## 2020-03-06 ENCOUNTER — Ambulatory Visit: Payer: BC Managed Care – PPO

## 2020-03-06 ENCOUNTER — Other Ambulatory Visit: Payer: Self-pay

## 2020-03-06 VITALS — BP 158/102 | HR 77 | Temp 98.0°F | Wt 174.0 lb

## 2020-03-06 DIAGNOSIS — I10 Essential (primary) hypertension: Secondary | ICD-10-CM

## 2020-03-06 DIAGNOSIS — R0789 Other chest pain: Secondary | ICD-10-CM | POA: Diagnosis not present

## 2020-03-06 DIAGNOSIS — R079 Chest pain, unspecified: Secondary | ICD-10-CM | POA: Diagnosis not present

## 2020-03-06 LAB — CBC WITH DIFFERENTIAL/PLATELET
Basophils Absolute: 0 10*3/uL (ref 0.0–0.1)
Basophils Relative: 0.8 % (ref 0.0–3.0)
Eosinophils Absolute: 0.1 10*3/uL (ref 0.0–0.7)
Eosinophils Relative: 3.2 % (ref 0.0–5.0)
HCT: 38.4 % (ref 36.0–46.0)
Hemoglobin: 12.9 g/dL (ref 12.0–15.0)
Lymphocytes Relative: 26.6 % (ref 12.0–46.0)
Lymphs Abs: 0.9 10*3/uL (ref 0.7–4.0)
MCHC: 33.5 g/dL (ref 30.0–36.0)
MCV: 99.9 fl (ref 78.0–100.0)
Monocytes Absolute: 0.3 10*3/uL (ref 0.1–1.0)
Monocytes Relative: 9.4 % (ref 3.0–12.0)
Neutro Abs: 2.1 10*3/uL (ref 1.4–7.7)
Neutrophils Relative %: 60 % (ref 43.0–77.0)
Platelets: 184 10*3/uL (ref 150.0–400.0)
RBC: 3.85 Mil/uL — ABNORMAL LOW (ref 3.87–5.11)
RDW: 12.6 % (ref 11.5–15.5)
WBC: 3.5 10*3/uL — ABNORMAL LOW (ref 4.0–10.5)

## 2020-03-06 LAB — TROPONIN I (HIGH SENSITIVITY): High Sens Troponin I: 6 ng/L (ref 2–17)

## 2020-03-06 MED ORDER — METOPROLOL SUCCINATE ER 25 MG PO TB24
25.0000 mg | ORAL_TABLET | Freq: Every day | ORAL | 0 refills | Status: DC
Start: 2020-03-06 — End: 2020-03-29

## 2020-03-06 MED ORDER — TRIAMCINOLONE ACETONIDE 0.5 % EX CREA: TOPICAL_CREAM | Freq: Two times a day (BID) | CUTANEOUS | 0 refills | Status: DC | PRN

## 2020-03-06 NOTE — Progress Notes (Signed)
Kelly Santos is a 74 y.o. female with the following history as recorded in [employer]:  Patient Active Problem List   Diagnosis Date Noted  . New daily persistent headache 08/25/2019  . Paresthesia 08/25/2019  . Foot pain, right 03/24/2019  . Epigastric pain 07/08/2018  . Oral ulcer 05/22/2018  . Neck pain 05/08/2018  . Pleural effusion 05/08/2018  . RLQ abdominal pain 11/14/2017  . Nausea 11/14/2017  . Edema 02/28/2017  . Acute upper respiratory infection 05/07/2016  . Cough 05/07/2016  . 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 Fatigue 11/02/2015  . Hyperglycemia 03/24/2015  . Well adult exam 11/27/2012  . Colon polyp, hyperplastic 11/27/2012  . Low back pain 08/03/2012  . Foot pain, left 04/14/2012  . Callus of foot 04/14/2012  . Leg pain, left 07/09/2011  . Diastolic dysfunction 93/90/3009  . Hypokalemia 05/15/2011  . Thyrotoxicosis 03/26/2010  . RECTAL BLEEDING 03/26/2010  . HEMORRHOIDS 03/23/2010  . Anemia due to chronic blood loss 03/23/2010  . HERPES ZOSTER 11/09/2009  . Febrile illness, acute 11/03/2009  . Pain in limb 10/04/2009  . Headache 10/04/2009  . TINEA PEDIS 06/20/2009  . ALLERGIC RHINITIS 12/15/2008  . Chest pain, atypical 11/08/2008  . CERVICAL RADICULOPATHY, RIGHT 03/25/2008  . KNEE PAIN, ACUTE 01/18/2008  . Vitamin D deficiency 11/21/2007  . ABNORMAL CHEST XRAY 11/21/2007  . Essential hypertension 11/18/2007  . Coronary atherosclerosis 11/18/2007  . GERD 11/18/2007  . DIVERTICULOSIS, COLON 11/18/2007  . OSTEOARTHRITIS 11/18/2007    Current Outpatient Medications  Medication Sig Dispense Refill  . amitriptyline (ELAVIL) 25 MG tablet Take 1 tablet (25 mg total) by mouth at bedtime as needed for sleep. Due for routine OV 30 tablet 5  . BREO ELLIPTA 100-25 MCG/INH AEPB INHALE 1 PUFF INTO THE LUNGS EVERY DAY 3 each 3  . cholecalciferol (VITAMIN D) 1000 UNITS tablet Take 1,000 Units by mouth every other day.     . COMBIGAN 0.2-0.5 % ophthalmic solution INSTILL  1 DROP INTO BOTH EYES TWICE A DAY 5 mL 2  . EDARBYCLOR 40-25 MG TABS TAKE 1 TABLET BY MOUTH EVERY DAY IN THE MORNING 30 tablet 11  . Ferrous Sulfate (IRON) 325 (65 FE) MG TABS Take 1 tablet by mouth 3 (three) times a week.     . hydrocortisone (PROCTOCORT) 1 % CREA Apply 1 application topically 2 (two) times daily as needed (dry skin).     Marland Kitchen ibuprofen (ADVIL) 600 MG tablet Take 1 tablet (600 mg total) by mouth 2 (two) times daily as needed for moderate pain. 60 tablet 3  . KLOR-CON M20 20 MEQ tablet TAKE 1 TABLET BY MOUTH TWICE A DAY 60 tablet 11  . triamcinolone cream (KENALOG) 0.5 % Apply topically 2 (two) times daily as needed. 30 g 0  . valACYclovir (VALTREX) 500 MG tablet Take 1 tablet (500 mg total) by mouth 2 (two) times daily. 14 tablet 1  . cloNIDine (CATAPRES) 0.1 MG tablet Take 1 tablet (0.1 mg total) by mouth 2 (two) times daily as needed. Take if BP>170/110 (Patient not taking: Reported on 03/06/2020) 60 tablet 1  . metoprolol succinate (TOPROL-XL) 25 MG 24 hr tablet Take 1 tablet (25 mg total) by mouth daily. 30 tablet 0   No current facility-administered medications for this visit.    Allergies: Amlodipine  Past Medical History:  Diagnosis Date  . Anemia   . CAD (coronary artery disease)    no cardiologist, followed by PCP only  . Cataract   . Diverticulosis   .  Generalized headaches   . GERD (gastroesophageal reflux disease)   . Glaucoma   . Hemorrhoids   . Hiatal hernia    On CT done Jan 31, 2011   . Hx of colonic polyps   . Hypertension   . Hyperthyroidism   . Osteoarthritis   . Uterine polyp   . Vitamin D deficiency     Past Surgical History:  Procedure Laterality Date  . ABDOMINAL HYSTERECTOMY N/A 04/26/2013   Procedure: HYSTERECTOMY ABDOMINAL;  Surgeon: Cyril Mourning, MD;  Location: [facility];  Service: Gynecology;  Laterality: N/A;  . DILATION AND CURETTAGE OF UTERUS  01/18/2013   with uterine polypectomy  . SALPINGOOPHORECTOMY Bilateral 04/26/2013    Procedure: SALPINGO OOPHORECTOMY;  Surgeon: Cyril Mourning, MD;  Location: [facility];  Service: Gynecology;  Laterality: Bilateral;    Family History  Problem Relation Age of Onset  . Hypertension Mother   . Hypertension Father   . Heart failure Father   . Deep vein thrombosis Son 35       x 2, chronic coumadin  . Hypertension Sister   . CVA Brother   . Diabetes Brother   . Hypertension Brother   . Colon cancer Neg Hx     Social History   Tobacco Use  . Smoking status: Never Smoker  . Smokeless tobacco: Never Used  Substance Use Topics  . Alcohol use: No    Alcohol/week: 0.0 standard drinks    Subjective:  Patient notes that she woke up "feeling bad" this morning; notes that her chest "feels funny" but denies any chest pain on exertion; states that she does not feel like she is getting a full breath but notes this has been an ongoing feeling for her; had her blood pressure checked today at work and it was elevated; according to medication list, she has been given Clonidine in the past to use if her blood pressure was up but she does not have active prescription because her blood pressure has never gone that high before; she does not check her blood pressure regularly;    Objective:  Vitals:   03/06/20 1548  BP: (!) 158/102  Pulse: 77  Temp: 98 F (36.7 C)  TempSrc: Oral  SpO2: 96%  Weight: 174 lb (78.9 kg)    General: Well developed, well nourished, in no acute distress  Skin : Warm and dry.  Head: Normocephalic and atraumatic  Eyes: Sclera and conjunctiva clear; pupils round and reactive to light; extraocular movements intact  Ears: External normal; canals clear; tympanic membranes normal  Oropharynx: Pink, supple. No suspicious lesions  Neck: Supple without thyromegaly, adenopathy  Lungs: Respirations unlabored; clear to auscultation bilaterally without wheeze, rales, rhonchi  CVS exam: normal rate and regular rhythm.  Abdomen: Soft; nontender; nondistended;  normoactive bowel sounds; no masses or hepatosplenomegaly  Musculoskeletal: No deformities; no active joint inflammation  Extremities: No edema, cyanosis, clubbing  Vessels: Symmetric bilaterally  Neurologic: Alert and oriented; speech intact; face symmetrical; moves all extremities well; CNII-XII intact without focal deficit   Assessment:  1. Atypical chest pain   2. Essential hypertension     Plan:  Physical exam today is reassuring; EKG today does not show acute changes/ NSR; update CXR and labs today; add Toprol XL 25 mg to her Edarbychlor; follow-up to be determined;  This visit occurred during the SARS-CoV-2 public health emergency.  Safety protocols were in place, including screening questions prior to the visit, additional usage of staff PPE, and extensive cleaning  of exam room while observing appropriate contact time as indicated for disinfecting solutions.    No follow-ups on file.  Orders Placed This Encounter  Procedures  . DG Chest 2 View    Standing Status:   Future    Number of Occurrences:   1    Standing Expiration Date:   03/06/2021    Order Specific Question:   Reason for Exam (SYMPTOM  OR DIAGNOSIS REQUIRED)    Answer:   atypical chest pain    Order Specific Question:   Preferred imaging location?    Answer:   [facility]    Order Specific Question:   Radiology Contrast Protocol - do NOT remove file path    Answer:   [email or web address]  . CBC with Differential/Platelet    Standing Status:   Future    Number of Occurrences:   1    Standing Expiration Date:   03/06/2021  . Comp Met (CMET)    Standing Status:   Future    Number of Occurrences:   1    Standing Expiration Date:   03/06/2021  . D-Dimer, Quantitative    Standing Status:   Future    Number of Occurrences:   1    Standing Expiration Date:   03/06/2021  . Troponin I    Standing Status:   Future    Number of Occurrences:   1    Standing Expiration Date:    03/06/2021    Requested Prescriptions   Signed Prescriptions Disp Refills  . triamcinolone cream (KENALOG) 0.5 % 30 g 0    Sig: Apply topically 2 (two) times daily as needed.  . metoprolol succinate (TOPROL-XL) 25 MG 24 hr tablet 30 tablet 0    Sig: Take 1 tablet (25 mg total) by mouth daily.

## 2020-03-07 LAB — COMPREHENSIVE METABOLIC PANEL
ALT: 32 U/L (ref 0–35)
AST: 29 U/L (ref 0–37)
Albumin: 3.9 g/dL (ref 3.5–5.2)
Alkaline Phosphatase: 49 U/L (ref 39–117)
BUN: 12 mg/dL (ref 6–23)
CO2: 26 mEq/L (ref 19–32)
Calcium: 9.6 mg/dL (ref 8.4–10.5)
Chloride: 103 mEq/L (ref 96–112)
Creatinine, Ser: 0.77 mg/dL (ref 0.40–1.20)
GFR: 88.73 mL/min (ref >60.00)
Glucose, Bld: 88 mg/dL (ref 70–99)
Potassium: 3 mEq/L — ABNORMAL LOW (ref 3.5–5.1)
Sodium: 138 mEq/L (ref 135–145)
Total Bilirubin: 0.3 mg/dL (ref 0.2–1.2)
Total Protein: 7.4 g/dL (ref 6.0–8.3)

## 2020-03-07 LAB — D-DIMER, QUANTITATIVE: D-Dimer, Quant: 0.49 mcg/mL FEU (ref <0.50)

## 2020-03-08 DIAGNOSIS — Z1159 Encounter for screening for other viral diseases: Secondary | ICD-10-CM | POA: Diagnosis not present

## 2020-03-08 DIAGNOSIS — Z20828 Contact with and (suspected) exposure to other viral communicable diseases: Secondary | ICD-10-CM | POA: Diagnosis not present

## 2020-03-15 ENCOUNTER — Ambulatory Visit: Payer: BC Managed Care – PPO | Admitting: Internal Medicine

## 2020-03-15 ENCOUNTER — Encounter: Payer: Self-pay | Admitting: Internal Medicine

## 2020-03-15 ENCOUNTER — Other Ambulatory Visit: Payer: Self-pay | Admitting: Internal Medicine

## 2020-03-15 ENCOUNTER — Other Ambulatory Visit: Payer: Self-pay

## 2020-03-15 VITALS — BP 160/98 | HR 66 | Temp 98.9°F | Ht 63.0 in | Wt 174.0 lb

## 2020-03-15 DIAGNOSIS — E785 Hyperlipidemia, unspecified: Secondary | ICD-10-CM

## 2020-03-15 DIAGNOSIS — I872 Venous insufficiency (chronic) (peripheral): Secondary | ICD-10-CM | POA: Diagnosis not present

## 2020-03-15 DIAGNOSIS — R609 Edema, unspecified: Secondary | ICD-10-CM

## 2020-03-15 DIAGNOSIS — E876 Hypokalemia: Secondary | ICD-10-CM

## 2020-03-15 MED ORDER — EDARBI 80 MG PO TABS
1.0000 | ORAL_TABLET | Freq: Every day | ORAL | 3 refills | Status: DC
Start: 2020-03-15 — End: 2020-03-29

## 2020-03-15 NOTE — Assessment & Plan Note (Signed)
LLE - trace edema - chronic RLE no edema

## 2020-03-15 NOTE — Patient Instructions (Addendum)
Stop [employer] System    Chronic Venous Insufficiency Chronic venous insufficiency is a condition where the leg veins cannot effectively pump blood from the legs to the heart. This happens when the vein walls are either stretched, weakened, or damaged, or when the valves inside the vein are damaged. With the right treatment, you should be able to continue with an active life. This condition is also called venous stasis. What are the causes? Common causes of this condition include:  High blood pressure inside the veins (venous hypertension).  Sitting or standing too long, causing increased blood pressure in the leg veins.  A blood clot that blocks blood flow in a vein (deep vein thrombosis, DVT).  Inflammation of a vein (phlebitis) that causes a blood clot to form.  Tumors in the pelvis that cause blood to back up. What increases the risk? The following factors may make you more likely to develop this condition:  Having a family history of this condition.  Obesity.  Pregnancy.  Living without enough regular physical activity or exercise (sedentary lifestyle).  Smoking.  Having a job that requires long periods of standing or sitting in one place.  Being a certain age. Women in their 20s and 25s and men in their 59s are more likely to develop this condition. What are the signs or symptoms? Symptoms of this condition include:  Veins that are enlarged, bulging, or twisted (varicose veins).  Skin breakdown or ulcers.  Reddened skin or dark discoloration of skin on the leg between the knee and ankle.  Brown, smooth, tight, and painful skin just above the ankle, usually on the inside of the leg (lipodermatosclerosis).  Swelling of the legs. How is this diagnosed? This condition may be diagnosed based on:  Your medical history.  A physical exam.  Tests, such as: ? A procedure that creates an image of a blood vessel and nearby organs and provides information about  blood flow through the blood vessel (duplex ultrasound). ? A procedure that tests blood flow (plethysmography). ? A procedure that looks at the veins using X-ray and dye (venogram). How is this treated? The goals of treatment are to help you return to an active life and to minimize pain or disability. Treatment depends on the severity of your condition, and it may include:  Wearing compression stockings. These can help relieve symptoms and help prevent your condition from getting worse. However, they do not cure the condition.  Sclerotherapy. This procedure involves an injection of a solution that shrinks damaged veins.  Surgery. This may involve: ? Removing a diseased vein (vein stripping). ? Cutting off blood flow through the vein (laser ablation surgery). ? Repairing or reconstructing a valve within the affected vein. Follow these instructions at home:      Wear compression stockings as told by your health care provider. These stockings help to prevent blood clots and reduce swelling in your legs.  Take over-the-counter and prescription medicines only as told by your health care provider.  Stay active by exercising, walking, or doing different activities. Ask your health care provider what activities are safe for you and how much exercise you need.  Drink enough fluid to keep your urine pale yellow.  Do not use any products that contain nicotine or tobacco, such as cigarettes, e-cigarettes, and chewing tobacco. If you need help quitting, ask your health care provider.  Keep all follow-up visits as told by your health care provider. This is important. Contact a health care provider if you:  Have redness, swelling, or more pain in the affected area.  See a red streak or line that goes up or down from the affected area.  Have skin breakdown or skin loss in the affected area, even if the breakdown is small.  Get an injury in the affected area. Get help right away if:  You get  an injury and an open wound in the affected area.  You have: ? Severe pain that does not get better with medicine. ? Sudden numbness or weakness in the foot or ankle below the affected area. ? Trouble moving your foot or ankle. ? A fever. ? Worse or persistent symptoms. ? Chest pain. ? Shortness of breath. Summary  Chronic venous insufficiency is a condition where the leg veins cannot effectively pump blood from the legs to the heart.  Chronic venous insufficiency occurs when the vein walls become stretched, weakened, or damaged, or when valves within the vein are damaged.  Treatment depends on how severe your condition is. It often involves wearing compression stockings and may involve having a procedure.  Make sure you stay active by exercising, walking, or doing different activities. Ask your health care provider what activities are safe for you and how much exercise you need. This information is not intended to replace advice given to you by your health care provider. Make sure you discuss any questions you have with your health care provider. Document Revised: 06/09/2018 Document Reviewed: 06/09/2018 Elsevier Patient Education  [employer].     We can try generic Micardis, Benicar, Avapro in place of [place]   .avpct

## 2020-03-15 NOTE — Assessment & Plan Note (Signed)
D/c HCTZ 

## 2020-03-15 NOTE — Assessment & Plan Note (Signed)
Chronic  LLE - trace edema - chronic RLE no edema

## 2020-03-15 NOTE — Progress Notes (Signed)
Subjective:  Patient ID: Kelly Santos, female    DOB: [birth year]  Age: 74 y.o. MRN: [id number]  CC: No chief complaint on file.   HPI Kelly Santos presents for HTN, low K f/u C/o LLE edema - chronic  Outpatient Medications Prior to Visit  Medication Sig Dispense Refill  . amitriptyline (ELAVIL) 25 MG tablet Take 1 tablet (25 mg total) by mouth at bedtime as needed for sleep. Due for routine OV 30 tablet 5  . BREO ELLIPTA 100-25 MCG/INH AEPB INHALE 1 PUFF INTO THE LUNGS EVERY DAY 3 each 3  . cholecalciferol (VITAMIN D) 1000 UNITS tablet Take 1,000 Units by mouth every other day.     . cloNIDine (CATAPRES) 0.1 MG tablet Take 1 tablet (0.1 mg total) by mouth 2 (two) times daily as needed. Take if BP>170/110 60 tablet 1  . COMBIGAN 0.2-0.5 % ophthalmic solution INSTILL 1 DROP INTO BOTH EYES TWICE A DAY 5 mL 2  . EDARBYCLOR 40-25 MG TABS TAKE 1 TABLET BY MOUTH EVERY DAY IN THE MORNING 30 tablet 11  . Ferrous Sulfate (IRON) 325 (65 FE) MG TABS Take 1 tablet by mouth 3 (three) times a week.     . hydrocortisone (PROCTOCORT) 1 % CREA Apply 1 application topically 2 (two) times daily as needed (dry skin).     Marland Kitchen ibuprofen (ADVIL) 600 MG tablet Take 1 tablet (600 mg total) by mouth 2 (two) times daily as needed for moderate pain. 60 tablet 3  . KLOR-CON M20 20 MEQ tablet TAKE 1 TABLET BY MOUTH TWICE A DAY 60 tablet 11  . metoprolol succinate (TOPROL-XL) 25 MG 24 hr tablet Take 1 tablet (25 mg total) by mouth daily. 30 tablet 0  . triamcinolone cream (KENALOG) 0.5 % Apply topically 2 (two) times daily as needed. 30 g 0  . valACYclovir (VALTREX) 500 MG tablet Take 1 tablet (500 mg total) by mouth 2 (two) times daily. 14 tablet 1   No facility-administered medications prior to visit.    ROS: Review of Systems  Constitutional: Negative for activity change, appetite change, chills, fatigue and unexpected weight change.  HENT: Negative for congestion, mouth sores and sinus pressure.   Eyes:  Negative for visual disturbance.  Respiratory: Negative for cough and chest tightness.   Gastrointestinal: Negative for abdominal pain and nausea.  Genitourinary: Negative for difficulty urinating, frequency and vaginal pain.  Musculoskeletal: Negative for back pain and gait problem.  Skin: Negative for pallor and rash.  Neurological: Negative for dizziness, tremors, weakness, numbness and headaches.  Psychiatric/Behavioral: Negative for confusion and sleep disturbance.    Objective:  BP (!) 160/98 (BP Location: Left Arm, Patient Position: Sitting, Cuff Size: Normal)   Pulse 66   Temp 98.9 F (37.2 C) (Oral)   Ht 5\' 3"  (1.6 m)   Wt 174 lb (78.9 kg)   SpO2 98%   BMI 30.82 kg/m   BP Readings from Last 3 Encounters:  03/15/20 (!) 160/98  03/06/20 (!) 158/102  01/26/20 (!) 142/90    Wt Readings from Last 3 Encounters:  03/15/20 174 lb (78.9 kg)  03/06/20 174 lb (78.9 kg)  01/26/20 173 lb (78.5 kg)    Physical Exam Constitutional:      General: She is not in acute distress.    Appearance: She is well-developed.  HENT:     Head: Normocephalic.     Right Ear: External ear normal.     Left Ear: External ear normal.  Nose: Nose normal.  Eyes:     General:        Right eye: No discharge.        Left eye: No discharge.     Conjunctiva/sclera: Conjunctivae normal.     Pupils: Pupils are equal, round, and reactive to light.  Neck:     Thyroid: No thyromegaly.     Vascular: No JVD.     Trachea: No tracheal deviation.  Cardiovascular:     Rate and Rhythm: Normal rate and regular rhythm.     Heart sounds: Normal heart sounds.  Pulmonary:     Effort: No respiratory distress.     Breath sounds: No stridor. No wheezing.  Abdominal:     General: Bowel sounds are normal. There is no distension.     Palpations: Abdomen is soft. There is no mass.     Tenderness: There is no abdominal tenderness. There is no guarding or rebound.  Musculoskeletal:        General: No  tenderness.     Cervical back: Normal range of motion and neck supple.     Right lower leg: No edema.     Left lower leg: Edema present.  Lymphadenopathy:     Cervical: No cervical adenopathy.  Skin:    Findings: No erythema or rash.  Neurological:     Cranial Nerves: No cranial nerve deficit.     Motor: No abnormal muscle tone.     Coordination: Coordination normal.     Deep Tendon Reflexes: Reflexes normal.  Psychiatric:        Behavior: Behavior normal.        Thought Content: Thought content normal.        Judgment: Judgment normal.      LLE - trace edema - chronic RLE no edema  Lab Results  Component Value Date   WBC 3.5 (L) 03/06/2020   HGB 12.9 03/06/2020   HCT 38.4 03/06/2020   PLT 184.0 03/06/2020   GLUCOSE 88 03/06/2020   CHOL 174 08/25/2019   TRIG 121.0 08/25/2019   HDL 46.80 08/25/2019   LDLCALC 103 (H) 08/25/2019   ALT 32 03/06/2020   AST 29 03/06/2020   NA 138 03/06/2020   K 3.0 (L) 03/06/2020   CL 103 03/06/2020   CREATININE 0.77 03/06/2020   BUN 12 03/06/2020   CO2 26 03/06/2020   TSH 0.65 01/26/2020   INR 0.96 05/09/2011   HGBA1C 5.8 (H) 01/23/2017    DG Chest 2 View  Result Date: 03/07/2020 CLINICAL DATA:  Chest pain EXAM: CHEST - 2 VIEW COMPARISON:  December 17 2018 FINDINGS: There is minimal scarring in the left base. Lungs otherwise are clear. Heart is upper normal in size with pulmonary vascularity normal. No adenopathy. There is mild degenerative change in the thoracic spine. No pneumothorax. IMPRESSION: Lungs clear.  Stable cardiac silhouette.  No adenopathy. Electronically Signed   By: Lowella Grip III M.D.   On: 03/07/2020 10:45    Assessment & Plan:   There are no diagnoses linked to this encounter.   No orders of the defined types were placed in this encounter.    Follow-up: No follow-ups on file.  Walker Kehr, MD

## 2020-03-16 ENCOUNTER — Telehealth: Payer: Self-pay | Admitting: Internal Medicine

## 2020-03-16 NOTE — Telephone Encounter (Signed)
    Pt c/o medication issue:  1. Name of Medication: Azilsartan Medoxomil (EDARBI) 80 MG TABS  2. How are you currently taking this medication (dosage and times per day)? n/a  3. Are you having a reaction (difficulty breathing--STAT)? no  4. What is your medication issue? Patients states medication too costly ( $240, 30 day supply)

## 2020-03-17 ENCOUNTER — Other Ambulatory Visit: Payer: Self-pay | Admitting: Internal Medicine

## 2020-03-17 MED ORDER — CANDESARTAN CILEXETIL 32 MG PO TABS
32.0000 mg | ORAL_TABLET | Freq: Every day | ORAL | 3 refills | Status: DC
Start: 2020-03-17 — End: 2021-03-05

## 2020-03-17 NOTE — Telephone Encounter (Signed)
Did should check with the her pharmacy if other alternatives were covered (i.e. olmesartan)?  Thanks

## 2020-03-17 NOTE — Telephone Encounter (Signed)
Left detailed message informing pt to check with her rx benefit coverage for cheaper alternatives and let us know.

## 2020-03-22 DIAGNOSIS — Z1159 Encounter for screening for other viral diseases: Secondary | ICD-10-CM | POA: Diagnosis not present

## 2020-03-22 DIAGNOSIS — Z20828 Contact with and (suspected) exposure to other viral communicable diseases: Secondary | ICD-10-CM | POA: Diagnosis not present

## 2020-03-23 ENCOUNTER — Telehealth: Payer: Self-pay

## 2020-03-23 NOTE — Telephone Encounter (Signed)
Returned pt's call, left message for pt to call back

## 2020-03-23 NOTE — Telephone Encounter (Signed)
New message   Returning call back to CMA from today.   Aware of appt on 6.30.21

## 2020-03-24 NOTE — Telephone Encounter (Signed)
Left pt a detailed message of the previous vm that was left for her about he Rx benefits and an alternative for a prescription requested

## 2020-03-26 ENCOUNTER — Emergency Department (HOSPITAL_COMMUNITY)
Admission: EM | Admit: 2020-03-26 | Discharge: 2020-03-27 | Disposition: A | Discharge status: Home / Self Care | Payer: BC Managed Care – PPO | Attending: Emergency Medicine | Admitting: Emergency Medicine

## 2020-03-26 ENCOUNTER — Other Ambulatory Visit: Payer: Self-pay

## 2020-03-26 ENCOUNTER — Encounter (HOSPITAL_COMMUNITY): Payer: Self-pay | Admitting: Emergency Medicine

## 2020-03-26 DIAGNOSIS — Z79899 Other long term (current) drug therapy: Secondary | ICD-10-CM | POA: Diagnosis not present

## 2020-03-26 DIAGNOSIS — I2510 Atherosclerotic heart disease of native coronary artery without angina pectoris: Secondary | ICD-10-CM | POA: Insufficient documentation

## 2020-03-26 DIAGNOSIS — R079 Chest pain, unspecified: Secondary | ICD-10-CM

## 2020-03-26 DIAGNOSIS — E039 Hypothyroidism, unspecified: Secondary | ICD-10-CM | POA: Diagnosis not present

## 2020-03-26 DIAGNOSIS — I158 Other secondary hypertension: Secondary | ICD-10-CM

## 2020-03-26 DIAGNOSIS — R0789 Other chest pain: Secondary | ICD-10-CM | POA: Diagnosis not present

## 2020-03-26 DIAGNOSIS — I10 Essential (primary) hypertension: Secondary | ICD-10-CM | POA: Diagnosis not present

## 2020-03-26 MED ORDER — CLONIDINE HCL 0.1 MG PO TABS
0.1000 mg | ORAL_TABLET | Freq: Once | ORAL | Status: AC
  Administered 2020-03-27: 0.1 mg via ORAL
  Filled 2020-03-26: qty 1

## 2020-03-26 NOTE — ED Provider Notes (Signed)
[facility] DEPT Provider Note  CSN: [id number] Arrival date & time: 03/26/20 2308  Chief Complaint(s) Hypertension  HPI Kelly Santos is a 74 y.o. female   The history is provided by the patient.  Hypertension This is a chronic (noted to be higher today) problem. The current episode started 12 to 24 hours ago. The problem occurs constantly. The problem has not changed since onset.Associated symptoms include chest pain (noted approx 3-4 hrs ago. Sharp to LL chest/LUQ area. lasted 10 minutes. resolved on it's own.  similar to prior pain from GERD. ). Pertinent negatives include no abdominal pain, no headaches and no shortness of breath. Nothing aggravates the symptoms. Nothing relieves the symptoms.   Patient was recently transitioned to new BP medication 2 weeks ago due to low K.   Past Medical History Past Medical History:  Diagnosis Date  . Anemia   . CAD (coronary artery disease)    no cardiologist, followed by PCP only  . Cataract   . Diverticulosis   . Generalized headaches   . GERD (gastroesophageal reflux disease)   . Glaucoma   . Hemorrhoids   . Hiatal hernia    On CT done Jan 31, 2011   . Hx of colonic polyps   . Hypertension   . Hyperthyroidism   . Osteoarthritis   . Uterine polyp   . Vitamin D deficiency    Patient Active Problem List   Diagnosis Date Noted  . Chronic venous insufficiency 03/15/2020  . New daily persistent headache 08/25/2019  . Paresthesia 08/25/2019  . Foot pain, right 03/24/2019  . Epigastric pain 07/08/2018  . Oral ulcer 05/22/2018  . Neck pain 05/08/2018  . Pleural effusion 05/08/2018  . RLQ abdominal pain 11/14/2017  . Nausea 11/14/2017  . Edema 02/28/2017  . Acute upper respiratory infection 05/07/2016  . Cough 05/07/2016  . 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 Fatigue 11/02/2015  . Hyperglycemia 03/24/2015  . Well adult exam 11/27/2012  . Colon polyp, hyperplastic 11/27/2012  . Low back pain 08/03/2012   . Foot pain, left 04/14/2012  . Callus of foot 04/14/2012  . Leg pain, left 07/09/2011  . Diastolic dysfunction 17/00/1749  . Hypokalemia 05/15/2011  . Thyrotoxicosis 03/26/2010  . RECTAL BLEEDING 03/26/2010  . HEMORRHOIDS 03/23/2010  . Anemia due to chronic blood loss 03/23/2010  . HERPES ZOSTER 11/09/2009  . Febrile illness, acute 11/03/2009  . Pain in limb 10/04/2009  . Headache 10/04/2009  . TINEA PEDIS 06/20/2009  . ALLERGIC RHINITIS 12/15/2008  . Chest pain, atypical 11/08/2008  . CERVICAL RADICULOPATHY, RIGHT 03/25/2008  . KNEE PAIN, ACUTE 01/18/2008  . Vitamin D deficiency 11/21/2007  . ABNORMAL CHEST XRAY 11/21/2007  . Essential hypertension 11/18/2007  . Coronary atherosclerosis 11/18/2007  . GERD 11/18/2007  . DIVERTICULOSIS, COLON 11/18/2007  . OSTEOARTHRITIS 11/18/2007   Home Medication(s) Prior to Admission medications   Medication Sig Start Date End Date Taking? Authorizing Provider  amitriptyline (ELAVIL) 25 MG tablet Take 1 tablet (25 mg total) by mouth at bedtime as needed for sleep. Due for routine OV 12/17/18   Plotnikov, Evie Lacks, MD  Azilsartan Medoxomil (EDARBI) 80 MG TABS Take 1 tablet (80 mg total) by mouth daily. 03/15/20   Plotnikov, Evie Lacks, MD  BREO ELLIPTA 100-25 MCG/INH AEPB INHALE 1 PUFF INTO THE LUNGS EVERY DAY 07/11/19   Plotnikov, Evie Lacks, MD  candesartan (ATACAND) 32 MG tablet Take 1 tablet (32 mg total) by mouth daily. 03/17/20   Plotnikov, Evie Lacks,  MD  cholecalciferol (VITAMIN D) 1000 UNITS tablet Take 1,000 Units by mouth every other day.     [provider]  cloNIDine (CATAPRES) 0.1 MG tablet Take 1 tablet (0.1 mg total) by mouth 2 (two) times daily as needed. Take if BP>170/110 03/27/20   Brendalyn Vallely, Grayce Sessions, MD  COMBIGAN 0.2-0.5 % ophthalmic solution INSTILL 1 DROP INTO BOTH EYES TWICE A DAY 07/23/18   Plotnikov, Evie Lacks, MD  Ferrous Sulfate (IRON) 325 (65 FE) MG TABS Take 1 tablet by mouth 3 (three) times a week.      [provider]  hydrocortisone (PROCTOCORT) 1 % CREA Apply 1 application topically 2 (two) times daily as needed (dry skin).     [provider]  ibuprofen (ADVIL) 600 MG tablet Take 1 tablet (600 mg total) by mouth 2 (two) times daily as needed for moderate pain. 03/24/19   Plotnikov, Evie Lacks, MD  KLOR-CON M20 20 MEQ tablet TAKE 1 TABLET BY MOUTH TWICE A DAY 12/26/19   Plotnikov, Evie Lacks, MD  metoprolol succinate (TOPROL-XL) 25 MG 24 hr tablet Take 1 tablet (25 mg total) by mouth daily. 03/06/20   Marrian Salvage, FNP  triamcinolone cream (KENALOG) 0.5 % Apply topically 2 (two) times daily as needed. 03/06/20   Marrian Salvage, FNP  valACYclovir (VALTREX) 500 MG tablet Take 1 tablet (500 mg total) by mouth 2 (two) times daily. 05/22/18   Plotnikov, Evie Lacks, MD                                                                                                                                    Past Surgical History Past Surgical History:  Procedure Laterality Date  . ABDOMINAL HYSTERECTOMY N/A 04/26/2013   Procedure: HYSTERECTOMY ABDOMINAL;  Surgeon: Cyril Mourning, MD;  Location: [facility];  Service: Gynecology;  Laterality: N/A;  . DILATION AND CURETTAGE OF UTERUS  01/18/2013   with uterine polypectomy  . SALPINGOOPHORECTOMY Bilateral 04/26/2013   Procedure: SALPINGO OOPHORECTOMY;  Surgeon: Cyril Mourning, MD;  Location: [facility];  Service: Gynecology;  Laterality: Bilateral;   Family History Family History  Problem Relation Age of Onset  . Hypertension Mother   . Hypertension Father   . Heart failure Father   . Deep vein thrombosis Son 35       x 2, chronic coumadin  . Hypertension Sister   . CVA Brother   . Diabetes Brother   . Hypertension Brother   . Colon cancer Neg Hx     Social History Social History   Tobacco Use  . Smoking status: Never Smoker  . Smokeless tobacco: Never Used  Vaping Use  . Vaping Use: Never used  Substance Use Topics  .  Alcohol use: No    Alcohol/week: 0.0 standard drinks  . Drug use: No   Allergies Amlodipine  Review of Systems Review of Systems  Respiratory: Negative for shortness of  breath.   Cardiovascular: Positive for chest pain (noted approx 3-4 hrs ago. Sharp to LL chest/LUQ area. lasted 10 minutes. resolved on it's own.  similar to prior pain from GERD. ).  Gastrointestinal: Negative for abdominal pain.  Neurological: Negative for headaches.   All other systems are reviewed and are negative for acute change except as noted in the HPI  Physical Exam Vital Signs  I have reviewed the triage vital signs BP (!) 181/110 (BP Location: Right Arm) Comment: RN Clarise Cruz D made aware  Pulse 62   Temp 98.7 F (37.1 C) (Oral)   Resp 15   Ht 5\' 3"  (1.6 m)   Wt 78.9 kg   SpO2 100%   BMI 30.82 kg/m   Physical Exam Vitals reviewed.  Constitutional:      General: She is not in acute distress.    Appearance: She is well-developed. She is not diaphoretic.  HENT:     Head: Normocephalic and atraumatic.     Nose: Nose normal.  Eyes:     General: No scleral icterus.       Right eye: No discharge.        Left eye: No discharge.     Conjunctiva/sclera: Conjunctivae normal.     Pupils: Pupils are equal, round, and reactive to light.  Cardiovascular:     Rate and Rhythm: Normal rate and regular rhythm.     Heart sounds: No murmur heard.  No friction rub. No gallop.   Pulmonary:     Effort: Pulmonary effort is normal. No respiratory distress.     Breath sounds: Normal breath sounds. No stridor. No rales.  Abdominal:     General: There is no distension.     Palpations: Abdomen is soft.     Tenderness: There is no abdominal tenderness.  Musculoskeletal:        General: No tenderness.     Cervical back: Normal range of motion and neck supple.  Skin:    General: Skin is warm and dry.     Findings: No erythema or rash.  Neurological:     Mental Status: She is alert and oriented to person, place, and  time.     ED Results and Treatments Labs (all labs ordered are listed, but only abnormal results are displayed) Labs Reviewed  CBC WITH DIFFERENTIAL/PLATELET - Abnormal; Notable for the following components:      Result Value   RBC 3.74 (*)    MCV 100.5 (*)    All other components within normal limits  BASIC METABOLIC PANEL - Abnormal; Notable for the following components:   Potassium 3.2 (*)    Glucose, Bld 113 (*)    All other components within normal limits  TROPONIN I (HIGH SENSITIVITY)                                                                                                                         EKG  EKG Interpretation  Date/Time:  Monday March 27 2020 00:13:14 EDT Ventricular Rate:  57 PR Interval:    QRS Duration: 97 QT Interval:  469 QTC Calculation: 457 R Axis:   2 Text Interpretation: Sinus rhythm Probable left ventricular hypertrophy Borderline T abnormalities, anterior leads No significant change since last tracing Confirmed by Addison Lank [phone]) on 03/27/2020 1:06:21 AM      Radiology DG Chest 2 View  Result Date: 03/27/2020 CLINICAL DATA:  Hypertension EXAM: CHEST - 2 VIEW COMPARISON:  03/06/2020 FINDINGS: Frontal and lateral views of the chest demonstrates stable ectasia of the thoracic aorta. The cardiac silhouette is unremarkable. No airspace disease, effusion, or pneumothorax. No acute bony abnormalities. IMPRESSION: 1. Stable exam, no acute process. Electronically Signed   By: Randa Ngo M.D.   On: 03/27/2020 00:17    Pertinent labs & imaging results that were available during my care of the patient were reviewed by me and considered in my medical decision making (see chart for details).  Medications Ordered in ED Medications  cloNIDine (CATAPRES) tablet 0.1 mg (0.1 mg Oral Given 03/27/20 0014)                                                                                                                                    Procedures .1-3  Lead EKG Interpretation Performed by: Fatima Blank, MD Authorized by: Fatima Blank, MD     Interpretation: normal     ECG rate:  60   ECG rate assessment: normal     Rhythm: sinus rhythm     Ectopy: none     Conduction: normal      (including critical care time)  Medical Decision Making / ED Course I have reviewed the nursing notes for this encounter and the patient's prior records (if available in EHR or on provided paperwork).   Kelly Santos was evaluated in Emergency Department on 03/27/2020 for the symptoms described in the history of present illness. She was evaluated in the context of the global COVID-19 pandemic, which necessitated consideration that the patient might be at risk for infection with the SARS-CoV-2 virus that causes COVID-19. Institutional protocols and algorithms that pertain to the evaluation of patients at risk for COVID-19 are in a state of rapid change based on information released by regulatory bodies including the CDC and federal and state organizations. These policies and algorithms were followed during the patient's care in the ED.  Patient here for hypertension.  Endorsed atypical chest pain highly inconsistent with ACS.  EKG without acute ischemic changes or evidence of pericarditis.  Troponin negative. Currently chest pain free. Doubt ACS, dissection, PE.  Labs reassuring without evidence of endorgan damage. Improved potassium. Patient provided with clonidine, resulting in downtrending blood pressures.  She remained asymptomatic and felt to be stable for outpatient management.      Final Clinical Impression(s) / ED Diagnoses Final diagnoses:  Chest pain  Other secondary hypertension  The patient appears reasonably screened and/or stabilized for discharge and I doubt any other medical condition or other [facility] requiring further screening, evaluation, or treatment in the ED at this time prior to discharge. Safe for discharge with  strict return precautions.  Disposition: Discharge  Condition: Good  I have discussed the results, Dx and Tx plan with the patient/family who expressed understanding and agree(s) with the plan. Discharge instructions discussed at length. The patient/family was given strict return precautions who verbalized understanding of the instructions. No further questions at time of discharge.    ED Discharge Orders         Ordered    cloNIDine (CATAPRES) 0.1 MG tablet  2 times daily PRN     Discontinue  Reprint     03/27/20 0252           Follow Up: Cassandria Anger, MD [place] [id number] [phone]  Call  to inform them of the elevated blood pressures     This chart was dictated using voice recognition software.  Despite best efforts to proofread,  errors can occur which can change the documentation meaning.   Fatima Blank, MD 03/27/20 [phone]

## 2020-03-26 NOTE — ED Triage Notes (Signed)
Patient had a recent blood pressure medication change due to hypokalemia. Patient denies symptoms. Patient was checking her BP at home and noted it to be high. Denies neurological symptoms.

## 2020-03-27 ENCOUNTER — Telehealth: Payer: Self-pay

## 2020-03-27 ENCOUNTER — Emergency Department (HOSPITAL_COMMUNITY): Payer: BC Managed Care – PPO

## 2020-03-27 LAB — CBC WITH DIFFERENTIAL/PLATELET
Abs Immature Granulocytes: 0.01 10*3/uL (ref 0.00–0.07)
Basophils Absolute: 0 10*3/uL (ref 0.0–0.1)
Basophils Relative: 1 %
Eosinophils Absolute: 0.1 10*3/uL (ref 0.0–0.5)
Eosinophils Relative: 3 %
HCT: 37.6 % (ref 36.0–46.0)
Hemoglobin: 12.4 g/dL (ref 12.0–15.0)
Immature Granulocytes: 0 %
Lymphocytes Relative: 26 %
Lymphs Abs: 1.1 10*3/uL (ref 0.7–4.0)
MCH: 33.2 pg (ref 26.0–34.0)
MCHC: 33 g/dL (ref 30.0–36.0)
MCV: 100.5 fL — ABNORMAL HIGH (ref 80.0–100.0)
Monocytes Absolute: 0.4 10*3/uL (ref 0.1–1.0)
Monocytes Relative: 10 %
Neutro Abs: 2.5 10*3/uL (ref 1.7–7.7)
Neutrophils Relative %: 60 %
Platelets: 199 10*3/uL (ref 150–400)
RBC: 3.74 MIL/uL — ABNORMAL LOW (ref 3.87–5.11)
RDW: 12.3 % (ref 11.5–15.5)
WBC: 4.2 10*3/uL (ref 4.0–10.5)
nRBC: 0 % (ref 0.0–0.2)

## 2020-03-27 LAB — BASIC METABOLIC PANEL
Anion gap: 6 (ref 5–15)
BUN: 13 mg/dL (ref 8–23)
CO2: 27 mmol/L (ref 22–32)
Calcium: 9.1 mg/dL (ref 8.9–10.3)
Chloride: 109 mmol/L (ref 98–111)
Creatinine, Ser: 0.68 mg/dL (ref 0.44–1.00)
GFR calc Af Amer: >60 mL/min (ref >60)
GFR calc non Af Amer: >60 mL/min (ref >60)
Glucose, Bld: 113 mg/dL — ABNORMAL HIGH (ref 70–99)
Potassium: 3.2 mmol/L — ABNORMAL LOW (ref 3.5–5.1)
Sodium: 142 mmol/L (ref 135–145)

## 2020-03-27 LAB — TROPONIN I (HIGH SENSITIVITY): Troponin I (High Sensitivity): 5 ng/L (ref <18)

## 2020-03-27 MED ORDER — CLONIDINE HCL 0.1 MG PO TABS: 0.1000 mg | ORAL_TABLET | Freq: Two times a day (BID) | ORAL | 1 refills | Status: DC | PRN

## 2020-03-27 NOTE — Telephone Encounter (Signed)
Per [facility] on 03/26/2020 9:42:48 PM, Caller states he blood pressure is 186/112. Reports pain around heart area. Reports sharp left sided chest pain not current. Denies any other symptoms.  Advised to go to ED now.

## 2020-03-29 ENCOUNTER — Other Ambulatory Visit: Payer: Self-pay

## 2020-03-29 ENCOUNTER — Ambulatory Visit (INDEPENDENT_AMBULATORY_CARE_PROVIDER_SITE_OTHER)
Admission: RE | Admit: 2020-03-29 | Discharge: 2020-03-29 | Disposition: A | Discharge status: Home / Self Care | Payer: Self-pay | Source: Ambulatory Visit | Attending: Internal Medicine | Admitting: Internal Medicine

## 2020-03-29 ENCOUNTER — Ambulatory Visit: Payer: BC Managed Care – PPO | Admitting: Internal Medicine

## 2020-03-29 ENCOUNTER — Encounter: Payer: Self-pay | Admitting: Internal Medicine

## 2020-03-29 DIAGNOSIS — E559 Vitamin D deficiency, unspecified: Secondary | ICD-10-CM

## 2020-03-29 DIAGNOSIS — R739 Hyperglycemia, unspecified: Secondary | ICD-10-CM

## 2020-03-29 DIAGNOSIS — I10 Essential (primary) hypertension: Secondary | ICD-10-CM | POA: Diagnosis not present

## 2020-03-29 DIAGNOSIS — E876 Hypokalemia: Secondary | ICD-10-CM

## 2020-03-29 DIAGNOSIS — E785 Hyperlipidemia, unspecified: Secondary | ICD-10-CM

## 2020-03-29 DIAGNOSIS — R911 Solitary pulmonary nodule: Secondary | ICD-10-CM | POA: Diagnosis not present

## 2020-03-29 LAB — BASIC METABOLIC PANEL
BUN: 10 mg/dL (ref 6–23)
CO2: 29 mEq/L (ref 19–32)
Calcium: 9.6 mg/dL (ref 8.4–10.5)
Chloride: 103 mEq/L (ref 96–112)
Creatinine, Ser: 0.81 mg/dL (ref 0.40–1.20)
GFR: 83.68 mL/min (ref >60.00)
Glucose, Bld: 106 mg/dL — ABNORMAL HIGH (ref 70–99)
Potassium: 3.5 mEq/L (ref 3.5–5.1)
Sodium: 136 mEq/L (ref 135–145)

## 2020-03-29 MED ORDER — METOPROLOL SUCCINATE ER 25 MG PO TB24: 25.0000 mg | ORAL_TABLET | Freq: Two times a day (BID) | ORAL | 11 refills | Status: DC

## 2020-03-29 NOTE — Addendum Note (Signed)
Addended by: Trenda Moots on: 6/80/8811 09:28 AM   Modules accepted: Orders

## 2020-03-29 NOTE — Assessment & Plan Note (Signed)
Vit d 

## 2020-03-29 NOTE — Progress Notes (Addendum)
Subjective:  Patient ID: Kelly Santos, female    DOB: 07-06-1946  Age: 74 y.o. MRN: [id number]  CC: No chief complaint on file.   HPI Kelly Santos presents for HTN - pt had to go to ER due to high BP 230/123 on 6/27. Pt received Clonidine. K was low. Pt had CP - resolved The pt was advised to get vaccinated for COVID19 ASAP ER notes, tests reviewed from 6/27  Outpatient Medications Prior to Visit  Medication Sig Dispense Refill  . amitriptyline (ELAVIL) 25 MG tablet Take 1 tablet (25 mg total) by mouth at bedtime as needed for sleep. Due for routine OV 30 tablet 5  . Azilsartan Medoxomil (EDARBI) 80 MG TABS Take 1 tablet (80 mg total) by mouth daily. 90 tablet 3  . BREO ELLIPTA 100-25 MCG/INH AEPB INHALE 1 PUFF INTO THE LUNGS EVERY DAY 3 each 3  . candesartan (ATACAND) 32 MG tablet Take 1 tablet (32 mg total) by mouth daily. 90 tablet 3  . cholecalciferol (VITAMIN D) 1000 UNITS tablet Take 1,000 Units by mouth every other day.     . cloNIDine (CATAPRES) 0.1 MG tablet Take 1 tablet (0.1 mg total) by mouth 2 (two) times daily as needed. Take if BP>170/110 60 tablet 1  . COMBIGAN 0.2-0.5 % ophthalmic solution INSTILL 1 DROP INTO BOTH EYES TWICE A DAY 5 mL 2  . Ferrous Sulfate (IRON) 325 (65 FE) MG TABS Take 1 tablet by mouth 3 (three) times a week.     . hydrocortisone (PROCTOCORT) 1 % CREA Apply 1 application topically 2 (two) times daily as needed (dry skin).     Marland Kitchen ibuprofen (ADVIL) 600 MG tablet Take 1 tablet (600 mg total) by mouth 2 (two) times daily as needed for moderate pain. 60 tablet 3  . KLOR-CON M20 20 MEQ tablet TAKE 1 TABLET BY MOUTH TWICE A DAY 60 tablet 11  . metoprolol succinate (TOPROL-XL) 25 MG 24 hr tablet Take 1 tablet (25 mg total) by mouth daily. 30 tablet 0  . triamcinolone cream (KENALOG) 0.5 % Apply topically 2 (two) times daily as needed. 30 g 0  . valACYclovir (VALTREX) 500 MG tablet Take 1 tablet (500 mg total) by mouth 2 (two) times daily. 14 tablet 1   No  facility-administered medications prior to visit.    ROS: Review of Systems  Constitutional: Negative for activity change, appetite change, chills, fatigue and unexpected weight change.  HENT: Negative for congestion, mouth sores and sinus pressure.   Eyes: Negative for visual disturbance.  Respiratory: Negative for cough and chest tightness.   Gastrointestinal: Negative for abdominal pain and nausea.  Genitourinary: Negative for difficulty urinating, frequency and vaginal pain.  Musculoskeletal: Negative for back pain and gait problem.  Skin: Negative for pallor and rash.  Neurological: Negative for dizziness, tremors, weakness, numbness and headaches.  Psychiatric/Behavioral: Negative for confusion, sleep disturbance and suicidal ideas.    Objective:  BP (!) 152/98 (BP Location: Left Arm, Patient Position: Sitting, Cuff Size: Normal)   Pulse 62   Temp 98.5 F (36.9 C) (Oral)   Ht 5\' 3"  (1.6 m)   Wt 175 lb (79.4 kg)   SpO2 98%   BMI 31.00 kg/m   BP Readings from Last 3 Encounters:  03/29/20 (!) 152/98  03/27/20 (!) 156/100  03/15/20 (!) 160/98    Wt Readings from Last 3 Encounters:  03/29/20 175 lb (79.4 kg)  03/26/20 174 lb (78.9 kg)  03/15/20 174 lb (78.9 kg)  Physical Exam Constitutional:      General: She is not in acute distress.    Appearance: She is well-developed.  HENT:     Head: Normocephalic.     Right Ear: External ear normal.     Left Ear: External ear normal.     Nose: Nose normal.  Eyes:     General:        Right eye: No discharge.        Left eye: No discharge.     Conjunctiva/sclera: Conjunctivae normal.     Pupils: Pupils are equal, round, and reactive to light.  Neck:     Thyroid: No thyromegaly.     Vascular: No JVD.     Trachea: No tracheal deviation.  Cardiovascular:     Rate and Rhythm: Normal rate and regular rhythm.     Heart sounds: Normal heart sounds.  Pulmonary:     Effort: No respiratory distress.     Breath sounds: No  stridor. No wheezing.  Abdominal:     General: Bowel sounds are normal. There is no distension.     Palpations: Abdomen is soft. There is no mass.     Tenderness: There is no abdominal tenderness. There is no guarding or rebound.  Musculoskeletal:        General: No tenderness.     Cervical back: Normal range of motion and neck supple.  Lymphadenopathy:     Cervical: No cervical adenopathy.  Skin:    Findings: No erythema or rash.  Neurological:     Cranial Nerves: No cranial nerve deficit.     Motor: No abnormal muscle tone.     Coordination: Coordination normal.     Deep Tendon Reflexes: Reflexes normal.  Psychiatric:        Behavior: Behavior normal.        Thought Content: Thought content normal.        Judgment: Judgment normal.     Lab Results  Component Value Date   WBC 4.2 03/27/2020   HGB 12.4 03/27/2020   HCT 37.6 03/27/2020   PLT 199 03/27/2020   GLUCOSE 113 (H) 03/27/2020   CHOL 174 08/25/2019   TRIG 121.0 08/25/2019   HDL 46.80 08/25/2019   LDLCALC 103 (H) 08/25/2019   ALT 32 03/06/2020   AST 29 03/06/2020   NA 142 03/27/2020   K 3.2 (L) 03/27/2020   CL 109 03/27/2020   CREATININE 0.68 03/27/2020   BUN 13 03/27/2020   CO2 27 03/27/2020   TSH 0.65 01/26/2020   INR 0.96 05/09/2011   HGBA1C 5.8 (H) 01/23/2017    DG Chest 2 View  Result Date: 03/27/2020 CLINICAL DATA:  Hypertension EXAM: CHEST - 2 VIEW COMPARISON:  03/06/2020 FINDINGS: Frontal and lateral views of the chest demonstrates stable ectasia of the thoracic aorta. The cardiac silhouette is unremarkable. No airspace disease, effusion, or pneumothorax. No acute bony abnormalities. IMPRESSION: 1. Stable exam, no acute process. Electronically Signed   By: Randa Ngo M.D.   On: 03/27/2020 00:17    Assessment & Plan:    Follow-up: No follow-ups on file.  Kelly Kehr, MD

## 2020-03-29 NOTE — Assessment & Plan Note (Signed)
Candesartan qd Replace low K

## 2020-03-29 NOTE — Assessment & Plan Note (Signed)
Candesartan qd Add Metoprolol bid Replace low K Clonidine prn

## 2020-03-29 NOTE — Assessment & Plan Note (Signed)
BMET 

## 2020-04-05 DIAGNOSIS — H401111 Primary open-angle glaucoma, right eye, mild stage: Secondary | ICD-10-CM | POA: Diagnosis not present

## 2020-04-05 DIAGNOSIS — H35373 Puckering of macula, bilateral: Secondary | ICD-10-CM | POA: Diagnosis not present

## 2020-04-05 DIAGNOSIS — H401122 Primary open-angle glaucoma, left eye, moderate stage: Secondary | ICD-10-CM | POA: Diagnosis not present

## 2020-04-05 DIAGNOSIS — Z961 Presence of intraocular lens: Secondary | ICD-10-CM | POA: Diagnosis not present

## 2020-04-19 DIAGNOSIS — Z20828 Contact with and (suspected) exposure to other viral communicable diseases: Secondary | ICD-10-CM | POA: Diagnosis not present

## 2020-04-19 DIAGNOSIS — Z1159 Encounter for screening for other viral diseases: Secondary | ICD-10-CM | POA: Diagnosis not present

## 2020-04-24 ENCOUNTER — Other Ambulatory Visit: Payer: Self-pay

## 2020-04-24 ENCOUNTER — Ambulatory Visit: Payer: BC Managed Care – PPO | Admitting: Internal Medicine

## 2020-04-24 ENCOUNTER — Encounter: Payer: Self-pay | Admitting: Internal Medicine

## 2020-04-24 DIAGNOSIS — I10 Essential (primary) hypertension: Secondary | ICD-10-CM

## 2020-04-24 DIAGNOSIS — E876 Hypokalemia: Secondary | ICD-10-CM

## 2020-04-24 DIAGNOSIS — I2510 Atherosclerotic heart disease of native coronary artery without angina pectoris: Secondary | ICD-10-CM

## 2020-04-24 DIAGNOSIS — K219 Gastro-esophageal reflux disease without esophagitis: Secondary | ICD-10-CM

## 2020-04-24 MED ORDER — AMITRIPTYLINE HCL 25 MG PO TABS: 25.0000 mg | ORAL_TABLET | Freq: Every evening | ORAL | 5 refills | Status: DC | PRN

## 2020-04-24 NOTE — Assessment & Plan Note (Addendum)
Better on diet. Tums prn

## 2020-04-24 NOTE — Assessment & Plan Note (Signed)
Resolved off HCTZ

## 2020-04-24 NOTE — Assessment & Plan Note (Signed)
ASA 81 mg

## 2020-04-24 NOTE — Patient Instructions (Signed)
Dear Kelly Santos, Your labs/tests are good, except for slightly elevated glucose. Your potassium is normal. Sincerely, Lew Dawes, MD   Dear Kelly Santos, Your coronary calcium score is 0. It is consistent with the absence of coronary atherosclerosis. Your current medical management is appropriate. Your other chest organs look normal with the exception of a tiny nodule in the right upper lobe. It looks benign and does not require any follow-up. Good news!  Sincerely, Lew Dawes, MD  CAD stands for Coronary Artery Disease  Calcium Score Presence of CAD  0           No evidence of CAD  1-10           Minimal evidence of CAD 11-100           Mild evidence of CAD 101-400     Moderate evidence of CAD Over 400     Extensive evidence of CAD  Written by Cassandria Anger, MD on 03/30/2020 7:30 AM EDT Seen by patient Kelly Santos on 04/02/2020 5:55 AM

## 2020-04-24 NOTE — Assessment & Plan Note (Signed)
BP nl today Candesartan  Coronary calcium score of 0 . This was 0 percentile for age and sex matched control.

## 2020-04-24 NOTE — Progress Notes (Signed)
Subjective:  Patient ID: Kelly Santos, female    DOB: [birth year]  Age: 74 y.o. MRN: [id number]  CC: No chief complaint on file.   HPI Wreatha T Hayworth presents for HTN, GERD f/u The pt stopped Metoprolol due to fatigue, depressed mood - better  Outpatient Medications Prior to Visit  Medication Sig Dispense Refill  . amitriptyline (ELAVIL) 25 MG tablet Take 1 tablet (25 mg total) by mouth at bedtime as needed for sleep. Due for routine OV 30 tablet 5  . BREO ELLIPTA 100-25 MCG/INH AEPB INHALE 1 PUFF INTO THE LUNGS EVERY DAY 3 each 3  . candesartan (ATACAND) 32 MG tablet Take 1 tablet (32 mg total) by mouth daily. 90 tablet 3  . cholecalciferol (VITAMIN D) 1000 UNITS tablet Take 1,000 Units by mouth every other day.     . cloNIDine (CATAPRES) 0.1 MG tablet Take 1 tablet (0.1 mg total) by mouth 2 (two) times daily as needed. Take if BP>170/110 60 tablet 1  . COMBIGAN 0.2-0.5 % ophthalmic solution INSTILL 1 DROP INTO BOTH EYES TWICE A DAY 5 mL 2  . Ferrous Sulfate (IRON) 325 (65 FE) MG TABS Take 1 tablet by mouth 3 (three) times a week.     . hydrocortisone (PROCTOCORT) 1 % CREA Apply 1 application topically 2 (two) times daily as needed (dry skin).     Marland Kitchen ibuprofen (ADVIL) 600 MG tablet Take 1 tablet (600 mg total) by mouth 2 (two) times daily as needed for moderate pain. 60 tablet 3  . KLOR-CON M20 20 MEQ tablet TAKE 1 TABLET BY MOUTH TWICE A DAY 60 tablet 11  . triamcinolone cream (KENALOG) 0.5 % Apply topically 2 (two) times daily as needed. 30 g 0  . valACYclovir (VALTREX) 500 MG tablet Take 1 tablet (500 mg total) by mouth 2 (two) times daily. 14 tablet 1  . metoprolol succinate (TOPROL-XL) 25 MG 24 hr tablet Take 1 tablet (25 mg total) by mouth in the morning and at bedtime. 60 tablet 11   No facility-administered medications prior to visit.    ROS: Review of Systems  Constitutional: Negative for activity change, appetite change, chills, fatigue and unexpected weight change.  HENT:  Negative for congestion, mouth sores and sinus pressure.   Eyes: Negative for visual disturbance.  Respiratory: Negative for cough and chest tightness.   Gastrointestinal: Negative for abdominal pain and nausea.  Genitourinary: Negative for difficulty urinating, frequency and vaginal pain.  Musculoskeletal: Negative for back pain and gait problem.  Skin: Negative for pallor and rash.  Neurological: Negative for dizziness, tremors, weakness, numbness and headaches.  Psychiatric/Behavioral: Negative for confusion and sleep disturbance.    Objective:  BP 114/76 (BP Location: Left Arm, Patient Position: Sitting, Cuff Size: Large)   Pulse 65   Temp 99.2 F (37.3 C) (Oral)   Ht 5\' 3"  (1.6 m)   Wt 173 lb (78.5 kg)   SpO2 98%   BMI 30.65 kg/m   BP Readings from Last 3 Encounters:  04/24/20 114/76  03/29/20 (!) 152/98  03/27/20 (!) 156/100    Wt Readings from Last 3 Encounters:  04/24/20 173 lb (78.5 kg)  03/29/20 175 lb (79.4 kg)  03/26/20 174 lb (78.9 kg)    Physical Exam Constitutional:      General: She is not in acute distress.    Appearance: She is well-developed.  HENT:     Head: Normocephalic.     Right Ear: External ear normal.     Left  Ear: External ear normal.     Nose: Nose normal.  Eyes:     General:        Right eye: No discharge.        Left eye: No discharge.     Conjunctiva/sclera: Conjunctivae normal.     Pupils: Pupils are equal, round, and reactive to light.  Neck:     Thyroid: No thyromegaly.     Vascular: No JVD.     Trachea: No tracheal deviation.  Cardiovascular:     Rate and Rhythm: Normal rate and regular rhythm.     Heart sounds: Normal heart sounds.  Pulmonary:     Effort: No respiratory distress.     Breath sounds: No stridor. No wheezing.  Abdominal:     General: Bowel sounds are normal. There is no distension.     Palpations: Abdomen is soft. There is no mass.     Tenderness: There is no abdominal tenderness. There is no guarding or  rebound.  Musculoskeletal:        General: No tenderness.     Cervical back: Normal range of motion and neck supple.  Lymphadenopathy:     Cervical: No cervical adenopathy.  Skin:    Findings: No erythema or rash.  Neurological:     Cranial Nerves: No cranial nerve deficit.     Motor: No abnormal muscle tone.     Coordination: Coordination normal.     Deep Tendon Reflexes: Reflexes normal.  Psychiatric:        Behavior: Behavior normal.        Thought Content: Thought content normal.        Judgment: Judgment normal.     Lab Results  Component Value Date   WBC 4.2 03/27/2020   HGB 12.4 03/27/2020   HCT 37.6 03/27/2020   PLT 199 03/27/2020   GLUCOSE 106 (H) 03/29/2020   CHOL 174 08/25/2019   TRIG 121.0 08/25/2019   HDL 46.80 08/25/2019   LDLCALC 103 (H) 08/25/2019   ALT 32 03/06/2020   AST 29 03/06/2020   NA 136 03/29/2020   K 3.5 03/29/2020   CL 103 03/29/2020   CREATININE 0.81 03/29/2020   BUN 10 03/29/2020   CO2 29 03/29/2020   TSH 0.65 01/26/2020   INR 0.96 05/09/2011   HGBA1C 5.8 (H) 01/23/2017    CT CARDIAC SCORING  Addendum Date: 03/29/2020   ADDENDUM REPORT: 03/29/2020 13:04 CLINICAL DATA:  Risk stratification EXAM: Coronary Calcium Score TECHNIQUE: The patient was scanned on a [employer] scanner. Axial non-contrast 3 mm slices were carried out through the heart. The data set was analyzed on a dedicated work station and scored using the [place]. FINDINGS: Non-cardiac: See separate report from [facility]. Ascending Aorta: Normal caliber.  No calcifications. Pericardium: Normal Coronary arteries: Normal coronary origin of LM. Cannot fully visualize takeoff of RCA. IMPRESSION: Coronary calcium score of 0 . This was 0 percentile for age and sex matched control. Fransico Him Electronically Signed   By: Fransico Him   On: 03/29/2020 13:04   Result Date: 03/29/2020 EXAM: OVER-READ INTERPRETATION  CT CHEST The following report is an over-read  performed by radiologist Dr. Vinnie Langton of [facility], [place] on 03/29/2020. This over-read does not include interpretation of cardiac or coronary anatomy or pathology. The coronary calcium score interpretation by the cardiologist is attached. COMPARISON:  None. FINDINGS: 4 mm right upper lobe pulmonary nodule (axial image 3 of series 3). Within the visualized portions of the  thorax there are no suspicious appearing pulmonary nodules or masses, there is no acute consolidative airspace disease, no pleural effusions, no pneumothorax and no lymphadenopathy. Visualized portions of the upper abdomen are unremarkable. There are no aggressive appearing lytic or blastic lesions noted in the visualized portions of the skeleton. IMPRESSION: 1. 4 mm right upper lobe pulmonary nodule, nonspecific, but statistically likely benign. No follow-up needed if patient is low-risk. Non-contrast chest CT can be considered in 12 months if patient is high-risk. This recommendation follows the consensus statement: Guidelines for Management of Incidental Pulmonary Nodules Detected on CT Images: From the Fleischner Society 2017; Radiology 2017; 284:228-243. Electronically Signed: By: Vinnie Langton M.D. On: 03/29/2020 11:51    Assessment & Plan:   There are no diagnoses linked to this encounter.   No orders of the defined types were placed in this encounter.    Follow-up: No follow-ups on file.  Walker Kehr, MD

## 2020-05-03 DIAGNOSIS — Z20828 Contact with and (suspected) exposure to other viral communicable diseases: Secondary | ICD-10-CM | POA: Diagnosis not present

## 2020-05-03 DIAGNOSIS — Z1159 Encounter for screening for other viral diseases: Secondary | ICD-10-CM | POA: Diagnosis not present

## 2020-05-10 DIAGNOSIS — Z20828 Contact with and (suspected) exposure to other viral communicable diseases: Secondary | ICD-10-CM | POA: Diagnosis not present

## 2020-05-10 DIAGNOSIS — Z1159 Encounter for screening for other viral diseases: Secondary | ICD-10-CM | POA: Diagnosis not present

## 2020-06-06 ENCOUNTER — Telehealth: Payer: Self-pay | Admitting: Internal Medicine

## 2020-06-06 ENCOUNTER — Emergency Department (HOSPITAL_BASED_OUTPATIENT_CLINIC_OR_DEPARTMENT_OTHER): Payer: BC Managed Care – PPO

## 2020-06-06 ENCOUNTER — Other Ambulatory Visit: Payer: Self-pay

## 2020-06-06 ENCOUNTER — Emergency Department (HOSPITAL_BASED_OUTPATIENT_CLINIC_OR_DEPARTMENT_OTHER)
Admission: EM | Admit: 2020-06-06 | Discharge: 2020-06-07 | Disposition: A | Discharge status: Home / Self Care | Payer: BC Managed Care – PPO | Attending: Emergency Medicine | Admitting: Emergency Medicine

## 2020-06-06 ENCOUNTER — Encounter (HOSPITAL_BASED_OUTPATIENT_CLINIC_OR_DEPARTMENT_OTHER): Payer: Self-pay | Admitting: Emergency Medicine

## 2020-06-06 DIAGNOSIS — I2510 Atherosclerotic heart disease of native coronary artery without angina pectoris: Secondary | ICD-10-CM | POA: Insufficient documentation

## 2020-06-06 DIAGNOSIS — Z79899 Other long term (current) drug therapy: Secondary | ICD-10-CM | POA: Insufficient documentation

## 2020-06-06 DIAGNOSIS — R0602 Shortness of breath: Secondary | ICD-10-CM | POA: Diagnosis not present

## 2020-06-06 DIAGNOSIS — K5730 Diverticulosis of large intestine without perforation or abscess without bleeding: Secondary | ICD-10-CM | POA: Diagnosis not present

## 2020-06-06 DIAGNOSIS — I517 Cardiomegaly: Secondary | ICD-10-CM | POA: Diagnosis not present

## 2020-06-06 DIAGNOSIS — E039 Hypothyroidism, unspecified: Secondary | ICD-10-CM | POA: Insufficient documentation

## 2020-06-06 DIAGNOSIS — R072 Precordial pain: Secondary | ICD-10-CM

## 2020-06-06 DIAGNOSIS — I10 Essential (primary) hypertension: Secondary | ICD-10-CM | POA: Diagnosis not present

## 2020-06-06 DIAGNOSIS — R1013 Epigastric pain: Secondary | ICD-10-CM

## 2020-06-06 DIAGNOSIS — I700 Atherosclerosis of aorta: Secondary | ICD-10-CM | POA: Diagnosis not present

## 2020-06-06 DIAGNOSIS — K449 Diaphragmatic hernia without obstruction or gangrene: Secondary | ICD-10-CM | POA: Diagnosis not present

## 2020-06-06 LAB — BASIC METABOLIC PANEL
Anion gap: 10 (ref 5–15)
BUN: 18 mg/dL (ref 8–23)
CO2: 23 mmol/L (ref 22–32)
Calcium: 9.6 mg/dL (ref 8.9–10.3)
Chloride: 101 mmol/L (ref 98–111)
Creatinine, Ser: 0.73 mg/dL (ref 0.44–1.00)
GFR calc Af Amer: >60 mL/min (ref >60)
GFR calc non Af Amer: >60 mL/min (ref >60)
Glucose, Bld: 114 mg/dL — ABNORMAL HIGH (ref 70–99)
Potassium: 3.9 mmol/L (ref 3.5–5.1)
Sodium: 134 mmol/L — ABNORMAL LOW (ref 135–145)

## 2020-06-06 LAB — HEPATIC FUNCTION PANEL
ALT: 24 U/L (ref 0–44)
AST: 22 U/L (ref 15–41)
Albumin: 3.9 g/dL (ref 3.5–5.0)
Alkaline Phosphatase: 54 U/L (ref 38–126)
Bilirubin, Direct: 0.1 mg/dL (ref 0.0–0.2)
Indirect Bilirubin: 0.4 mg/dL (ref 0.3–0.9)
Total Bilirubin: 0.5 mg/dL (ref 0.3–1.2)
Total Protein: 7.8 g/dL (ref 6.5–8.1)

## 2020-06-06 LAB — CBC
HCT: 40.8 % (ref 36.0–46.0)
Hemoglobin: 13.4 g/dL (ref 12.0–15.0)
MCH: 32.8 pg (ref 26.0–34.0)
MCHC: 32.8 g/dL (ref 30.0–36.0)
MCV: 99.8 fL (ref 80.0–100.0)
Platelets: 201 10*3/uL (ref 150–400)
RBC: 4.09 MIL/uL (ref 3.87–5.11)
RDW: 12.2 % (ref 11.5–15.5)
WBC: 3.8 10*3/uL — ABNORMAL LOW (ref 4.0–10.5)
nRBC: 0 % (ref 0.0–0.2)

## 2020-06-06 LAB — TROPONIN I (HIGH SENSITIVITY)
Troponin I (High Sensitivity): 4 ng/L (ref <18)
Troponin I (High Sensitivity): 4 ng/L (ref <18)

## 2020-06-06 LAB — LIPASE, BLOOD: Lipase: 24 U/L (ref 11–51)

## 2020-06-06 MED ORDER — SODIUM CHLORIDE 0.9 % IV SOLN: INTRAVENOUS | Status: DC

## 2020-06-06 MED ORDER — IOHEXOL 350 MG/ML SOLN
100.0000 mL | Freq: Once | INTRAVENOUS | Status: AC | PRN
  Administered 2020-06-06: 100 mL via INTRAVENOUS

## 2020-06-06 NOTE — ED Triage Notes (Signed)
Pt c/o Chest pain, upper abdominal pain and back pain x 5 days. Pt denies N/V, also endorses HA.

## 2020-06-06 NOTE — Telephone Encounter (Signed)
    Patient calling to report "soreness in chest", shoulder blade pain, headache x4 days Patient states pain is getting worse Call transferred to [facility] for immediate advice

## 2020-06-07 NOTE — Discharge Instructions (Addendum)
Make an appointment follow-up with your primary care doctor.  Today's work-up to include CT chest CT abdomen pelvis without any acute findings.  Labs without significant abnormalities.  No evidence of an acute cardiac event.  Cardiac heart markers were normal x2.  Symptoms could be related to perhaps a stomach ulcer.  Your primary care doctor may want to make arrangements with gastroenterology to have an upper endoscopy.  Return for any new or worse symptoms.  Also follow-up with your primary care doctor regarding your blood pressures which have been high here.

## 2020-06-07 NOTE — ED Provider Notes (Signed)
[facility] EMERGENCY DEPARTMENT Provider Note   CSN: [id number] Arrival date & time: 06/06/20  1600     History Chief Complaint  Patient presents with  . Chest Pain    Quetzali T Hamlett is a 74 y.o. female.  Patient followed by Dr. Alain Marion.  Patient with a complaint of some lower substernal chest discomfort epigastric abdominal pain radiating to the lumbar part of the back or directly behind the chest area.  No nausea or vomiting no shortness of breath no headache.  Never had anything like this before.  Patient does have a history of hypertension and is on medication for it but blood pressures have been elevated here she presented with 165/106.  Heart rate 70.  Oxygen saturation her percent on room air.  Temp 98.6.  Symptoms have been ongoing for 5 days.        Past Medical History:  Diagnosis Date  . Anemia   . CAD (coronary artery disease)    no cardiologist, followed by PCP only  . Cataract   . Diverticulosis   . Generalized headaches   . GERD (gastroesophageal reflux disease)   . Glaucoma   . Hemorrhoids   . Hiatal hernia    On CT done Jan 31, 2011   . Hx of colonic polyps   . Hypertension   . Hyperthyroidism   . Osteoarthritis   . Uterine polyp   . Vitamin D deficiency     Patient Active Problem List   Diagnosis Date Noted  . Chronic venous insufficiency 03/15/2020  . New daily persistent headache 08/25/2019  . Paresthesia 08/25/2019  . Foot pain, right 03/24/2019  . Epigastric pain 07/08/2018  . Oral ulcer 05/22/2018  . Neck pain 05/08/2018  . Pleural effusion 05/08/2018  . RLQ abdominal pain 11/14/2017  . Nausea 11/14/2017  . Edema 02/28/2017  . Acute upper respiratory infection 05/07/2016  . Cough 05/07/2016  . 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 Fatigue 11/02/2015  . Hyperglycemia 03/24/2015  . Well adult exam 11/27/2012  . Colon polyp, hyperplastic 11/27/2012  . Low back pain 08/03/2012  . Foot pain, left 04/14/2012  . Callus of foot  04/14/2012  . Leg pain, left 07/09/2011  . Diastolic dysfunction 93/26/7124  . Hypokalemia 05/15/2011  . Thyrotoxicosis 03/26/2010  . RECTAL BLEEDING 03/26/2010  . HEMORRHOIDS 03/23/2010  . Anemia due to chronic blood loss 03/23/2010  . HERPES ZOSTER 11/09/2009  . Febrile illness, acute 11/03/2009  . Pain in limb 10/04/2009  . Headache 10/04/2009  . TINEA PEDIS 06/20/2009  . ALLERGIC RHINITIS 12/15/2008  . Chest pain, atypical 11/08/2008  . CERVICAL RADICULOPATHY, RIGHT 03/25/2008  . KNEE PAIN, ACUTE 01/18/2008  . Vitamin D deficiency 11/21/2007  . ABNORMAL CHEST XRAY 11/21/2007  . Essential hypertension 11/18/2007  . GERD 11/18/2007  . DIVERTICULOSIS, COLON 11/18/2007  . OSTEOARTHRITIS 11/18/2007    Past Surgical History:  Procedure Laterality Date  . ABDOMINAL HYSTERECTOMY N/A 04/26/2013   Procedure: HYSTERECTOMY ABDOMINAL;  Surgeon: Cyril Mourning, MD;  Location: [facility];  Service: Gynecology;  Laterality: N/A;  . DILATION AND CURETTAGE OF UTERUS  01/18/2013   with uterine polypectomy  . SALPINGOOPHORECTOMY Bilateral 04/26/2013   Procedure: SALPINGO OOPHORECTOMY;  Surgeon: Cyril Mourning, MD;  Location: [facility];  Service: Gynecology;  Laterality: Bilateral;     OB History   No obstetric history on file.     Family History  Problem Relation Age of Onset  . Hypertension Mother   . Hypertension Father   .  Heart failure Father   . Deep vein thrombosis Son 35       x 2, chronic coumadin  . Hypertension Sister   . CVA Brother   . Diabetes Brother   . Hypertension Brother   . Colon cancer Neg Hx     Social History   Tobacco Use  . Smoking status: Never Smoker  . Smokeless tobacco: Never Used  Vaping Use  . Vaping Use: Never used  Substance Use Topics  . Alcohol use: No    Alcohol/week: 0.0 standard drinks  . Drug use: No    Home Medications Prior to Admission medications   Medication Sig Start Date End Date Taking? Authorizing Provider  amitriptyline  (ELAVIL) 25 MG tablet Take 1 tablet (25 mg total) by mouth at bedtime as needed for sleep. Due for routine OV 04/24/20   Plotnikov, Evie Lacks, MD  BREO ELLIPTA 100-25 MCG/INH AEPB INHALE 1 PUFF INTO THE LUNGS EVERY DAY 07/11/19   Plotnikov, Evie Lacks, MD  candesartan (ATACAND) 32 MG tablet Take 1 tablet (32 mg total) by mouth daily. 03/17/20   Plotnikov, Evie Lacks, MD  cholecalciferol (VITAMIN D) 1000 UNITS tablet Take 1,000 Units by mouth every other day.     [provider]  cloNIDine (CATAPRES) 0.1 MG tablet Take 1 tablet (0.1 mg total) by mouth 2 (two) times daily as needed. Take if BP>170/110 03/27/20   Cardama, Grayce Sessions, MD  COMBIGAN 0.2-0.5 % ophthalmic solution INSTILL 1 DROP INTO BOTH EYES TWICE A DAY 07/23/18   Plotnikov, Evie Lacks, MD  Ferrous Sulfate (IRON) 325 (65 FE) MG TABS Take 1 tablet by mouth 3 (three) times a week.     [provider]  hydrocortisone (PROCTOCORT) 1 % CREA Apply 1 application topically 2 (two) times daily as needed (dry skin).     [provider]  ibuprofen (ADVIL) 600 MG tablet Take 1 tablet (600 mg total) by mouth 2 (two) times daily as needed for moderate pain. 03/24/19   Plotnikov, Evie Lacks, MD  KLOR-CON M20 20 MEQ tablet TAKE 1 TABLET BY MOUTH TWICE A DAY 12/26/19   Plotnikov, Evie Lacks, MD  triamcinolone cream (KENALOG) 0.5 % Apply topically 2 (two) times daily as needed. 03/06/20   Marrian Salvage, FNP  valACYclovir (VALTREX) 500 MG tablet Take 1 tablet (500 mg total) by mouth 2 (two) times daily. 05/22/18   Plotnikov, Evie Lacks, MD    Allergies    Amlodipine, Hctz [hydrochlorothiazide], and Metoprolol  Review of Systems   Review of Systems  Constitutional: Negative for chills and fever.  HENT: Negative for congestion, rhinorrhea and sore throat.   Eyes: Negative for visual disturbance.  Respiratory: Negative for cough and shortness of breath.   Cardiovascular: Positive for chest pain. Negative for palpitations and leg  swelling.  Gastrointestinal: Positive for abdominal pain. Negative for diarrhea, nausea and vomiting.  Genitourinary: Negative for dysuria.  Musculoskeletal: Negative for back pain and neck pain.  Skin: Negative for rash.  Neurological: Negative for dizziness, light-headedness and headaches.  Hematological: Does not bruise/bleed easily.  Psychiatric/Behavioral: Negative for confusion.    Physical Exam Updated Vital Signs BP (!) 190/105   Pulse (!) 56   Temp 98.6 F (37 C) (Oral)   Resp 15   Ht 1.6 m (5\' 3" )   Wt 76.2 kg   SpO2 99%   BMI 29.76 kg/m   Physical Exam Vitals and nursing note reviewed.  Constitutional:      General: She  is not in acute distress.    Appearance: Normal appearance. She is well-developed.  HENT:     Head: Normocephalic and atraumatic.  Eyes:     Extraocular Movements: Extraocular movements intact.     Conjunctiva/sclera: Conjunctivae normal.     Pupils: Pupils are equal, round, and reactive to light.  Cardiovascular:     Rate and Rhythm: Normal rate and regular rhythm.     Heart sounds: No murmur heard.   Pulmonary:     Effort: Pulmonary effort is normal. No respiratory distress.     Breath sounds: Normal breath sounds.  Abdominal:     General: There is no distension.     Palpations: Abdomen is soft.     Tenderness: There is no abdominal tenderness. There is no guarding.  Musculoskeletal:        General: No swelling.     Cervical back: Normal range of motion and neck supple.  Skin:    General: Skin is warm and dry.     Capillary Refill: Capillary refill takes less than 2 seconds.  Neurological:     General: No focal deficit present.     Mental Status: She is alert and oriented to person, place, and time.     Cranial Nerves: No cranial nerve deficit.     Sensory: No sensory deficit.     ED Results / Procedures / Treatments   Labs (all labs ordered are listed, but only abnormal results are displayed) Labs Reviewed  BASIC METABOLIC  PANEL - Abnormal; Notable for the following components:      Result Value   Sodium 134 (*)    Glucose, Bld 114 (*)    All other components within normal limits  CBC - Abnormal; Notable for the following components:   WBC 3.8 (*)    All other components within normal limits  HEPATIC FUNCTION PANEL  LIPASE, BLOOD  TROPONIN I (HIGH SENSITIVITY)  TROPONIN I (HIGH SENSITIVITY)    EKG EKG Interpretation  Date/Time:  Tuesday June 06 2020 16:08:34 EDT Ventricular Rate:  61 PR Interval:  186 QRS Duration: 76 QT Interval:  410 QTC Calculation: 412 R Axis:   12 Text Interpretation: Normal sinus rhythm Nonspecific T wave abnormality Abnormal ECG Confirmed by Fredia Sorrow [phone]) on 06/06/2020 9:25:09 PM   Radiology DG Chest 2 View  Result Date: 06/06/2020 CLINICAL DATA:  Chest and upper abdominal, back pain for the past 5 days. EXAM: CHEST - 2 VIEW COMPARISON:  Chest x-ray dated March 27, 2020. FINDINGS: Unchanged mild cardiomegaly. Normal pulmonary vascularity. No focal consolidation, pleural effusion, or pneumothorax. No acute osseous abnormality. IMPRESSION: No active cardiopulmonary disease. Electronically Signed   By: Titus Dubin M.D.   On: 06/06/2020 16:47   CT Angio Chest PE W/Cm &/Or Wo Cm  Result Date: 06/06/2020 CLINICAL DATA:  Abdominal pain. Shortness of breath with concern for a pulmonary embolism. EXAM: CT ANGIOGRAPHY CHEST CT ABDOMEN AND PELVIS WITH CONTRAST TECHNIQUE: Multidetector CT imaging of the chest was performed using the standard protocol during bolus administration of intravenous contrast. Multiplanar CT image reconstructions and MIPs were obtained to evaluate the vascular anatomy. Multidetector CT imaging of the abdomen and pelvis was performed using the standard protocol during bolus administration of intravenous contrast. CONTRAST:  193mL OMNIPAQUE IOHEXOL 350 MG/ML SOLN COMPARISON:  11/14/2017. FINDINGS: CTA CHEST FINDINGS Cardiovascular: Contrast injection  is sufficient to demonstrate satisfactory opacification of the pulmonary arteries to the segmental level. There is no pulmonary embolus or evidence of right  heart strain. The size of the main pulmonary artery is normal. Mild cardiomegaly. The course and caliber of the aorta are normal. There is no atherosclerotic calcification. Opacification decreased due to pulmonary arterial phase contrast bolus timing. Mediastinum/Nodes: -- No mediastinal lymphadenopathy. -- No hilar lymphadenopathy. -- No axillary lymphadenopathy. -- No supraclavicular lymphadenopathy. -- Normal thyroid gland where visualized. -there is a small hiatal hernia. Lungs/Pleura: There is a stable 4 mm pulmonary nodule in the right upper lobe. The lungs are otherwise clear. There is a right-sided Bochdalek hernia. Musculoskeletal: No chest wall abnormality. No bony spinal canal stenosis. Review of the MIP images confirms the above findings. CT ABDOMEN and PELVIS FINDINGS Hepatobiliary: The liver is normal. Normal gallbladder.There is no biliary ductal dilation. Pancreas: Normal contours without ductal dilatation. No peripancreatic fluid collection. Spleen: Unremarkable. Adrenals/Urinary Tract: --Adrenal glands: Unremarkable. --Right kidney/ureter: No hydronephrosis or radiopaque kidney stones. --Left kidney/ureter: No hydronephrosis or radiopaque kidney stones. --Urinary bladder: Unremarkable. Stomach/Bowel: --Stomach/Duodenum: No hiatal hernia or other gastric abnormality. Normal duodenal course and caliber. --Small bowel: Unremarkable. --Colon: There is scattered colonic diverticula without CT evidence for diverticulitis. --Appendix: Normal. Vascular/Lymphatic: Atherosclerotic calcification is present within the non-aneurysmal abdominal aorta, without hemodynamically significant stenosis. There is a large, partially visualized left great saphenous vein. This appears relatively stable since 2019. --No retroperitoneal lymphadenopathy. --No mesenteric  lymphadenopathy. --No pelvic or inguinal lymphadenopathy. Reproductive: Status post hysterectomy. No adnexal mass. Other: There is a trace amount of free fluid in the patient's pelvis. The abdominal wall is normal. Musculoskeletal. No acute displaced fractures. Review of the MIP images confirms the above findings. IMPRESSION: 1. No acute thoracic, abdominal or pelvic pathology. Specifically, no pulmonary embolus. 2. Small hiatal hernia. 3. Colonic diverticulosis without CT evidence for diverticulitis. Aortic Atherosclerosis (ICD10-I70.0). Electronically Signed   By: Constance Holster M.D.   On: 06/06/2020 22:43   CT Abdomen Pelvis W Contrast  Result Date: 06/06/2020 CLINICAL DATA:  Abdominal pain. Shortness of breath with concern for a pulmonary embolism. EXAM: CT ANGIOGRAPHY CHEST CT ABDOMEN AND PELVIS WITH CONTRAST TECHNIQUE: Multidetector CT imaging of the chest was performed using the standard protocol during bolus administration of intravenous contrast. Multiplanar CT image reconstructions and MIPs were obtained to evaluate the vascular anatomy. Multidetector CT imaging of the abdomen and pelvis was performed using the standard protocol during bolus administration of intravenous contrast. CONTRAST:  165mL OMNIPAQUE IOHEXOL 350 MG/ML SOLN COMPARISON:  11/14/2017. FINDINGS: CTA CHEST FINDINGS Cardiovascular: Contrast injection is sufficient to demonstrate satisfactory opacification of the pulmonary arteries to the segmental level. There is no pulmonary embolus or evidence of right heart strain. The size of the main pulmonary artery is normal. Mild cardiomegaly. The course and caliber of the aorta are normal. There is no atherosclerotic calcification. Opacification decreased due to pulmonary arterial phase contrast bolus timing. Mediastinum/Nodes: -- No mediastinal lymphadenopathy. -- No hilar lymphadenopathy. -- No axillary lymphadenopathy. -- No supraclavicular lymphadenopathy. -- Normal thyroid gland where  visualized. -there is a small hiatal hernia. Lungs/Pleura: There is a stable 4 mm pulmonary nodule in the right upper lobe. The lungs are otherwise clear. There is a right-sided Bochdalek hernia. Musculoskeletal: No chest wall abnormality. No bony spinal canal stenosis. Review of the MIP images confirms the above findings. CT ABDOMEN and PELVIS FINDINGS Hepatobiliary: The liver is normal. Normal gallbladder.There is no biliary ductal dilation. Pancreas: Normal contours without ductal dilatation. No peripancreatic fluid collection. Spleen: Unremarkable. Adrenals/Urinary Tract: --Adrenal glands: Unremarkable. --Right kidney/ureter: No hydronephrosis or radiopaque kidney stones. --Left kidney/ureter: No  hydronephrosis or radiopaque kidney stones. --Urinary bladder: Unremarkable. Stomach/Bowel: --Stomach/Duodenum: No hiatal hernia or other gastric abnormality. Normal duodenal course and caliber. --Small bowel: Unremarkable. --Colon: There is scattered colonic diverticula without CT evidence for diverticulitis. --Appendix: Normal. Vascular/Lymphatic: Atherosclerotic calcification is present within the non-aneurysmal abdominal aorta, without hemodynamically significant stenosis. There is a large, partially visualized left great saphenous vein. This appears relatively stable since 2019. --No retroperitoneal lymphadenopathy. --No mesenteric lymphadenopathy. --No pelvic or inguinal lymphadenopathy. Reproductive: Status post hysterectomy. No adnexal mass. Other: There is a trace amount of free fluid in the patient's pelvis. The abdominal wall is normal. Musculoskeletal. No acute displaced fractures. Review of the MIP images confirms the above findings. IMPRESSION: 1. No acute thoracic, abdominal or pelvic pathology. Specifically, no pulmonary embolus. 2. Small hiatal hernia. 3. Colonic diverticulosis without CT evidence for diverticulitis. Aortic Atherosclerosis (ICD10-I70.0). Electronically Signed   By: Constance Holster  M.D.   On: 06/06/2020 22:43    Procedures Procedures (including critical care time)  Medications Ordered in ED Medications  0.9 %  sodium chloride infusion (has no administration in time range)  iohexol (OMNIPAQUE) 350 MG/ML injection 100 mL (100 mLs Intravenous Contrast Given 06/06/20 2217)    ED Course  I have reviewed the triage vital signs and the nursing notes.  Pertinent labs & imaging results that were available during my care of the patient were reviewed by me and considered in my medical decision making (see chart for details).    MDM Rules/Calculators/A&P                          Work-up for both the chest pain and the epigastric abdominal pain without any acute findings.  CT angio CT abdomen pelvis was done.  No evidence of pulmonary embolus no acute lung findings.  CT abdomen without any acute findings.  Liver function tests were normal lipase normal.  No leukocytosis.  Troponins x2 very normal.  Symptoms could be related to an peptic ulcer.  But symptoms are not severe somewhat vague.  Refer patient back to primary care doctor determination of further treatment and possible upper endoscopy the gastroenterology.  Patient blood pressure elevated here.  Also needs follow-up with primary care doctor to make sure good blood pressure control.     Final Clinical Impression(s) / ED Diagnoses Final diagnoses:  Precordial pain  Epigastric pain  Essential hypertension    Rx / DC Orders ED Discharge Orders    None       Fredia Sorrow, MD 06/07/20 0005

## 2020-06-09 ENCOUNTER — Other Ambulatory Visit: Payer: Self-pay

## 2020-06-09 ENCOUNTER — Encounter: Payer: Self-pay | Admitting: Internal Medicine

## 2020-06-09 ENCOUNTER — Ambulatory Visit (INDEPENDENT_AMBULATORY_CARE_PROVIDER_SITE_OTHER): Payer: Medicare Other | Admitting: Internal Medicine

## 2020-06-09 VITALS — BP 182/108 | HR 85 | Temp 98.7°F | Ht 63.0 in | Wt 169.0 lb

## 2020-06-09 DIAGNOSIS — R0789 Other chest pain: Secondary | ICD-10-CM | POA: Diagnosis not present

## 2020-06-09 DIAGNOSIS — K449 Diaphragmatic hernia without obstruction or gangrene: Secondary | ICD-10-CM | POA: Diagnosis not present

## 2020-06-09 DIAGNOSIS — R1310 Dysphagia, unspecified: Secondary | ICD-10-CM | POA: Diagnosis not present

## 2020-06-09 DIAGNOSIS — K219 Gastro-esophageal reflux disease without esophagitis: Secondary | ICD-10-CM | POA: Diagnosis not present

## 2020-06-09 DIAGNOSIS — I10 Essential (primary) hypertension: Secondary | ICD-10-CM

## 2020-06-09 MED ORDER — PANTOPRAZOLE SODIUM 40 MG PO TBEC
40.0000 mg | DELAYED_RELEASE_TABLET | Freq: Every day | ORAL | 3 refills | Status: DC
Start: 2020-06-09 — End: 2021-03-19

## 2020-06-09 MED ORDER — SPIRONOLACTONE 25 MG PO TABS
25.0000 mg | ORAL_TABLET | Freq: Every day | ORAL | 3 refills | Status: DC
Start: 2020-06-09 — End: 2020-06-09

## 2020-06-09 MED ORDER — CLONIDINE HCL 0.1 MG PO TABS: 0.1000 mg | ORAL_TABLET | Freq: Two times a day (BID) | ORAL | 3 refills | Status: DC

## 2020-06-09 MED ORDER — SPIRONOLACTONE 25 MG PO TABS
25.0000 mg | ORAL_TABLET | Freq: Every day | ORAL | 3 refills | Status: DC
Start: 2020-06-09 — End: 2021-03-19

## 2020-06-09 MED ORDER — PANTOPRAZOLE SODIUM 40 MG PO TBEC: 40.0000 mg | DELAYED_RELEASE_TABLET | Freq: Every day | ORAL | 3 refills | Status: DC

## 2020-06-09 MED FILL — PANTOPRAZOLE SOD DR 40 MG T: 40 | 30 days supply | Qty: 30 | Fill #0

## 2020-06-09 MED FILL — SPIRONOLACTONE 25 MG TABS: 25 | 30 days supply | Qty: 30 | Fill #0

## 2020-06-09 NOTE — Progress Notes (Signed)
Subjective:  Patient ID: Kelly Santos, female    DOB: 1946/02/11  Age: 74 y.o. MRN: [id number]  CC: No chief complaint on file.   HPI Kelly Santos presents for ER f/u visit - 06/06/20  Per Dr Venita Sheffield - " Work-up for both the chest pain and the epigastric abdominal pain without any acute findings.  CT angio CT abdomen pelvis was done.  No evidence of pulmonary embolus no acute lung findings.  CT abdomen without any acute findings.  Liver function tests were normal lipase normal.  No leukocytosis.  Troponins x2 very normal.  Symptoms could be related to an peptic ulcer.  But symptoms are not severe somewhat vague.  Refer patient back to primary care doctor determination of further treatment and possible upper endoscopy the gastroenterology.  Patient blood pressure elevated here.  Also needs follow-up with primary care doctor to make sure good blood pressure control."    Outpatient Medications Prior to Visit  Medication Sig Dispense Refill  . amitriptyline (ELAVIL) 25 MG tablet Take 1 tablet (25 mg total) by mouth at bedtime as needed for sleep. Due for routine OV 30 tablet 5  . BREO ELLIPTA 100-25 MCG/INH AEPB INHALE 1 PUFF INTO THE LUNGS EVERY DAY 3 each 3  . candesartan (ATACAND) 32 MG tablet Take 1 tablet (32 mg total) by mouth daily. 90 tablet 3  . cholecalciferol (VITAMIN D) 1000 UNITS tablet Take 1,000 Units by mouth every other day.     . cloNIDine (CATAPRES) 0.1 MG tablet Take 1 tablet (0.1 mg total) by mouth 2 (two) times daily as needed. Take if BP>170/110 60 tablet 1  . COMBIGAN 0.2-0.5 % ophthalmic solution INSTILL 1 DROP INTO BOTH EYES TWICE A DAY 5 mL 2  . Ferrous Sulfate (IRON) 325 (65 FE) MG TABS Take 1 tablet by mouth 3 (three) times a week.     . hydrocortisone (PROCTOCORT) 1 % CREA Apply 1 application topically 2 (two) times daily as needed (dry skin).     Marland Kitchen ibuprofen (ADVIL) 600 MG tablet Take 1 tablet (600 mg total) by mouth 2 (two) times daily as needed for moderate  pain. 60 tablet 3  . KLOR-CON M20 20 MEQ tablet TAKE 1 TABLET BY MOUTH TWICE A DAY 60 tablet 11  . triamcinolone cream (KENALOG) 0.5 % Apply topically 2 (two) times daily as needed. 30 g 0  . valACYclovir (VALTREX) 500 MG tablet Take 1 tablet (500 mg total) by mouth 2 (two) times daily. 14 tablet 1   No facility-administered medications prior to visit.    ROS: Review of Systems  Constitutional: Positive for fatigue. Negative for activity change, appetite change, chills and unexpected weight change.  HENT: Negative for congestion, mouth sores and sinus pressure.   Eyes: Negative for visual disturbance.  Respiratory: Positive for chest tightness. Negative for cough.   Gastrointestinal: Negative for abdominal pain and nausea.  Genitourinary: Negative for difficulty urinating, frequency and vaginal pain.  Musculoskeletal: Positive for arthralgias. Negative for back pain and gait problem.  Skin: Negative for pallor and rash.  Neurological: Negative for dizziness, tremors, weakness, numbness and headaches.  Psychiatric/Behavioral: Negative for confusion and sleep disturbance.    Objective:  BP (!) 182/108 (BP Location: Left Arm, Patient Position: Sitting, Cuff Size: Large)   Pulse 85   Temp 98.7 F (37.1 C) (Oral)   Ht 5\' 3"  (1.6 m)   Wt 169 lb (76.7 kg)   SpO2 97%   BMI 29.94 kg/m  BP Readings from Last 3 Encounters:  06/09/20 (!) 182/108  06/07/20 (!) 167/107  04/24/20 114/76    Wt Readings from Last 3 Encounters:  06/09/20 169 lb (76.7 kg)  06/06/20 168 lb (76.2 kg)  04/24/20 173 lb (78.5 kg)    Physical Exam Constitutional:      General: She is not in acute distress.    Appearance: She is well-developed. She is obese.  HENT:     Head: Normocephalic.     Right Ear: External ear normal.     Left Ear: External ear normal.     Nose: Nose normal.  Eyes:     General:        Right eye: No discharge.        Left eye: No discharge.     Conjunctiva/sclera: Conjunctivae  normal.     Pupils: Pupils are equal, round, and reactive to light.  Neck:     Thyroid: No thyromegaly.     Vascular: No JVD.     Trachea: No tracheal deviation.  Cardiovascular:     Rate and Rhythm: Normal rate and regular rhythm.     Heart sounds: Normal heart sounds.  Pulmonary:     Effort: No respiratory distress.     Breath sounds: No stridor. No wheezing.  Abdominal:     General: Bowel sounds are normal. There is no distension.     Palpations: Abdomen is soft. There is no mass.     Tenderness: There is no abdominal tenderness. There is no guarding or rebound.  Musculoskeletal:        General: No tenderness.     Cervical back: Normal range of motion and neck supple.  Lymphadenopathy:     Cervical: No cervical adenopathy.  Skin:    Findings: No erythema or rash.  Neurological:     Cranial Nerves: No cranial nerve deficit.     Motor: No abnormal muscle tone.     Coordination: Coordination normal.     Deep Tendon Reflexes: Reflexes normal.  Psychiatric:        Behavior: Behavior normal.        Thought Content: Thought content normal.        Judgment: Judgment normal.    Total time spent >45 min: time was spent on coordination of care, reviewing past records, lab results and X rays; educating the patient on HTN etc. It is a complex case.   Lab Results  Component Value Date   WBC 3.8 (L) 06/06/2020   HGB 13.4 06/06/2020   HCT 40.8 06/06/2020   PLT 201 06/06/2020   GLUCOSE 114 (H) 06/06/2020   CHOL 174 08/25/2019   TRIG 121.0 08/25/2019   HDL 46.80 08/25/2019   LDLCALC 103 (H) 08/25/2019   ALT 24 06/06/2020   AST 22 06/06/2020   NA 134 (L) 06/06/2020   K 3.9 06/06/2020   CL 101 06/06/2020   CREATININE 0.73 06/06/2020   BUN 18 06/06/2020   CO2 23 06/06/2020   TSH 0.65 01/26/2020   INR 0.96 05/09/2011   HGBA1C 5.8 (H) 01/23/2017    DG Chest 2 View  Result Date: 06/06/2020 CLINICAL DATA:  Chest and upper abdominal, back pain for the past 5 days. EXAM: CHEST -  2 VIEW COMPARISON:  Chest x-ray dated March 27, 2020. FINDINGS: Unchanged mild cardiomegaly. Normal pulmonary vascularity. No focal consolidation, pleural effusion, or pneumothorax. No acute osseous abnormality. IMPRESSION: No active cardiopulmonary disease. Electronically Signed   By: Orville Govern.D.  On: 06/06/2020 16:47   CT Angio Chest PE W/Cm &/Or Wo Cm  Result Date: 06/06/2020 CLINICAL DATA:  Abdominal pain. Shortness of breath with concern for a pulmonary embolism. EXAM: CT ANGIOGRAPHY CHEST CT ABDOMEN AND PELVIS WITH CONTRAST TECHNIQUE: Multidetector CT imaging of the chest was performed using the standard protocol during bolus administration of intravenous contrast. Multiplanar CT image reconstructions and MIPs were obtained to evaluate the vascular anatomy. Multidetector CT imaging of the abdomen and pelvis was performed using the standard protocol during bolus administration of intravenous contrast. CONTRAST:  143mL OMNIPAQUE IOHEXOL 350 MG/ML SOLN COMPARISON:  11/14/2017. FINDINGS: CTA CHEST FINDINGS Cardiovascular: Contrast injection is sufficient to demonstrate satisfactory opacification of the pulmonary arteries to the segmental level. There is no pulmonary embolus or evidence of right heart strain. The size of the main pulmonary artery is normal. Mild cardiomegaly. The course and caliber of the aorta are normal. There is no atherosclerotic calcification. Opacification decreased due to pulmonary arterial phase contrast bolus timing. Mediastinum/Nodes: -- No mediastinal lymphadenopathy. -- No hilar lymphadenopathy. -- No axillary lymphadenopathy. -- No supraclavicular lymphadenopathy. -- Normal thyroid gland where visualized. -there is a small hiatal hernia. Lungs/Pleura: There is a stable 4 mm pulmonary nodule in the right upper lobe. The lungs are otherwise clear. There is a right-sided Bochdalek hernia. Musculoskeletal: No chest wall abnormality. No bony spinal canal stenosis. Review of the  MIP images confirms the above findings. CT ABDOMEN and PELVIS FINDINGS Hepatobiliary: The liver is normal. Normal gallbladder.There is no biliary ductal dilation. Pancreas: Normal contours without ductal dilatation. No peripancreatic fluid collection. Spleen: Unremarkable. Adrenals/Urinary Tract: --Adrenal glands: Unremarkable. --Right kidney/ureter: No hydronephrosis or radiopaque kidney stones. --Left kidney/ureter: No hydronephrosis or radiopaque kidney stones. --Urinary bladder: Unremarkable. Stomach/Bowel: --Stomach/Duodenum: No hiatal hernia or other gastric abnormality. Normal duodenal course and caliber. --Small bowel: Unremarkable. --Colon: There is scattered colonic diverticula without CT evidence for diverticulitis. --Appendix: Normal. Vascular/Lymphatic: Atherosclerotic calcification is present within the non-aneurysmal abdominal aorta, without hemodynamically significant stenosis. There is a large, partially visualized left great saphenous vein. This appears relatively stable since 2019. --No retroperitoneal lymphadenopathy. --No mesenteric lymphadenopathy. --No pelvic or inguinal lymphadenopathy. Reproductive: Status post hysterectomy. No adnexal mass. Other: There is a trace amount of free fluid in the patient's pelvis. The abdominal wall is normal. Musculoskeletal. No acute displaced fractures. Review of the MIP images confirms the above findings. IMPRESSION: 1. No acute thoracic, abdominal or pelvic pathology. Specifically, no pulmonary embolus. 2. Small hiatal hernia. 3. Colonic diverticulosis without CT evidence for diverticulitis. Aortic Atherosclerosis (ICD10-I70.0). Electronically Signed   By: Constance Holster M.D.   On: 06/06/2020 22:43   CT Abdomen Pelvis W Contrast  Result Date: 06/06/2020 CLINICAL DATA:  Abdominal pain. Shortness of breath with concern for a pulmonary embolism. EXAM: CT ANGIOGRAPHY CHEST CT ABDOMEN AND PELVIS WITH CONTRAST TECHNIQUE: Multidetector CT imaging of the  chest was performed using the standard protocol during bolus administration of intravenous contrast. Multiplanar CT image reconstructions and MIPs were obtained to evaluate the vascular anatomy. Multidetector CT imaging of the abdomen and pelvis was performed using the standard protocol during bolus administration of intravenous contrast. CONTRAST:  151mL OMNIPAQUE IOHEXOL 350 MG/ML SOLN COMPARISON:  11/14/2017. FINDINGS: CTA CHEST FINDINGS Cardiovascular: Contrast injection is sufficient to demonstrate satisfactory opacification of the pulmonary arteries to the segmental level. There is no pulmonary embolus or evidence of right heart strain. The size of the main pulmonary artery is normal. Mild cardiomegaly. The course and caliber of the aorta are  normal. There is no atherosclerotic calcification. Opacification decreased due to pulmonary arterial phase contrast bolus timing. Mediastinum/Nodes: -- No mediastinal lymphadenopathy. -- No hilar lymphadenopathy. -- No axillary lymphadenopathy. -- No supraclavicular lymphadenopathy. -- Normal thyroid gland where visualized. -there is a small hiatal hernia. Lungs/Pleura: There is a stable 4 mm pulmonary nodule in the right upper lobe. The lungs are otherwise clear. There is a right-sided Bochdalek hernia. Musculoskeletal: No chest wall abnormality. No bony spinal canal stenosis. Review of the MIP images confirms the above findings. CT ABDOMEN and PELVIS FINDINGS Hepatobiliary: The liver is normal. Normal gallbladder.There is no biliary ductal dilation. Pancreas: Normal contours without ductal dilatation. No peripancreatic fluid collection. Spleen: Unremarkable. Adrenals/Urinary Tract: --Adrenal glands: Unremarkable. --Right kidney/ureter: No hydronephrosis or radiopaque kidney stones. --Left kidney/ureter: No hydronephrosis or radiopaque kidney stones. --Urinary bladder: Unremarkable. Stomach/Bowel: --Stomach/Duodenum: No hiatal hernia or other gastric abnormality. Normal  duodenal course and caliber. --Small bowel: Unremarkable. --Colon: There is scattered colonic diverticula without CT evidence for diverticulitis. --Appendix: Normal. Vascular/Lymphatic: Atherosclerotic calcification is present within the non-aneurysmal abdominal aorta, without hemodynamically significant stenosis. There is a large, partially visualized left great saphenous vein. This appears relatively stable since 2019. --No retroperitoneal lymphadenopathy. --No mesenteric lymphadenopathy. --No pelvic or inguinal lymphadenopathy. Reproductive: Status post hysterectomy. No adnexal mass. Other: There is a trace amount of free fluid in the patient's pelvis. The abdominal wall is normal. Musculoskeletal. No acute displaced fractures. Review of the MIP images confirms the above findings. IMPRESSION: 1. No acute thoracic, abdominal or pelvic pathology. Specifically, no pulmonary embolus. 2. Small hiatal hernia. 3. Colonic diverticulosis without CT evidence for diverticulitis. Aortic Atherosclerosis (ICD10-I70.0). Electronically Signed   By: Constance Holster M.D.   On: 06/06/2020 22:43    Assessment & Plan:   There are no diagnoses linked to this encounter.   No orders of the defined types were placed in this encounter.    Follow-up: No follow-ups on file.  Walker Kehr, MD

## 2020-06-09 NOTE — Assessment & Plan Note (Signed)
Ref to Dr Henrene Pastor Re-start Protonix

## 2020-06-09 NOTE — Patient Instructions (Signed)
Hiatal Hernia  A hiatal hernia occurs when part of the stomach slides above the muscle that separates the abdomen from the chest (diaphragm). A person can be born with a hiatal hernia (congenital), or it may develop over time. In almost all cases of hiatal hernia, only the top part of the stomach pushes through the diaphragm. Many people have a hiatal hernia with no symptoms. The larger the hernia, the more likely it is that you will have symptoms. In some cases, a hiatal hernia allows stomach acid to flow back into the tube that carries food from your mouth to your stomach (esophagus). This may cause heartburn symptoms. Severe heartburn symptoms may mean that you have developed a condition called gastroesophageal reflux disease (GERD). What are the causes? This condition is caused by a weakness in the opening (hiatus) where the esophagus passes through the diaphragm to attach to the upper part of the stomach. A person may be born with a weakness in the hiatus, or a weakness can develop over time. What increases the risk? This condition is more likely to develop in:  Older people. Age is a major risk factor for a hiatal hernia, especially if you are over the age of 50.  Pregnant women.  People who are overweight.  People who have frequent constipation. What are the signs or symptoms? Symptoms of this condition usually develop in the form of GERD symptoms. Symptoms include:  Heartburn.  Belching.  Indigestion.  Trouble swallowing.  Coughing or wheezing.  Sore throat.  Hoarseness.  Chest pain.  Nausea and vomiting. How is this diagnosed? This condition may be diagnosed during testing for GERD. Tests that may be done include:  X-rays of your stomach or chest.  An upper gastrointestinal (GI) series. This is an X-ray exam of your GI tract that is taken after you swallow a chalky liquid that shows up clearly on the X-ray.  Endoscopy. This is a procedure to look into your stomach  using a thin, flexible tube that has a tiny camera and light on the end of it. How is this treated? This condition may be treated by:  Dietary and lifestyle changes to help reduce GERD symptoms.  Medicines. These may include: ? Over-the-counter antacids. ? Medicines that make your stomach empty more quickly. ? Medicines that block the production of stomach acid (H2 blockers). ? Stronger medicines to reduce stomach acid (proton pump inhibitors).  Surgery to repair the hernia, if other treatments are not helping. If you have no symptoms, you may not need treatment. Follow these instructions at home: Lifestyle and activity  Do not use any products that contain nicotine or tobacco, such as cigarettes and e-cigarettes. If you need help quitting, ask your health care provider.  Try to achieve and maintain a healthy body weight.  Avoid putting pressure on your abdomen. Anything that puts pressure on your abdomen increases the amount of acid that may be pushed up into your esophagus. ? Avoid bending over, especially after eating. ? Raise the head of your bed by putting blocks under the legs. This keeps your head and esophagus higher than your stomach. ? Do not wear tight clothing around your chest or stomach. ? Try not to strain when having a bowel movement, when urinating, or when lifting heavy objects. Eating and drinking  Avoid foods that can worsen GERD symptoms. These may include: ? Fatty foods, like fried foods. ? Citrus fruits, like oranges or lemon. ? Other foods and drinks that contain acid, like   orange juice or tomatoes. ? Spicy food. ? Chocolate.  Eat frequent small meals instead of three large meals a day. This helps prevent your stomach from getting too full. ? Eat slowly. ? Do not lie down right after eating. ? Do not eat 1-2 hours before bed.  Do not drink beverages with caffeine. These include cola, coffee, cocoa, and tea.  Do not drink alcohol. General  instructions  Take over-the-counter and prescription medicines only as told by your health care provider.  Keep all follow-up visits as told by your health care provider. This is important. Contact a health care provider if:  Your symptoms are not controlled with medicines or lifestyle changes.  You are having trouble swallowing.  You have coughing or wheezing that will not go away. Get help right away if:  Your pain is getting worse.  Your pain spreads to your arms, neck, jaw, teeth, or back.  You have shortness of breath.  You sweat for no reason.  You feel sick to your stomach (nauseous) or you vomit.  You vomit blood.  You have bright red blood in your stools.  You have black, tarry stools. This information is not intended to replace advice given to you by your health care provider. Make sure you discuss any questions you have with your health care provider. Document Revised: 08/29/2017 Document Reviewed: 04/21/2017 Elsevier Patient Education  2020 [employer]  

## 2020-06-09 NOTE — Assessment & Plan Note (Addendum)
H/o esoph stricture/HH/GERD - ?relapse Ref to Dr Henrene Pastor Re-start Protonix Recent chest and abd CT (-).

## 2020-06-09 NOTE — Assessment & Plan Note (Signed)
H/o esoph stricture - ?relapse Ref to Dr Henrene Pastor Re-start Protonix

## 2020-06-09 NOTE — Assessment & Plan Note (Signed)
Worse Cont Atacand Added Spironolact w/caution -- BMET in 1 mo Clonidine bid if tolerated

## 2020-06-11 DIAGNOSIS — Z20822 Contact with and (suspected) exposure to covid-19: Secondary | ICD-10-CM | POA: Diagnosis not present

## 2020-06-19 ENCOUNTER — Encounter: Payer: Self-pay | Admitting: Internal Medicine

## 2020-06-28 DIAGNOSIS — Z20822 Contact with and (suspected) exposure to covid-19: Secondary | ICD-10-CM | POA: Diagnosis not present

## 2020-06-29 ENCOUNTER — Encounter: Payer: Self-pay | Admitting: Internal Medicine

## 2020-07-28 ENCOUNTER — Other Ambulatory Visit (INDEPENDENT_AMBULATORY_CARE_PROVIDER_SITE_OTHER): Payer: Medicare Other

## 2020-07-28 DIAGNOSIS — E876 Hypokalemia: Secondary | ICD-10-CM | POA: Diagnosis not present

## 2020-07-28 DIAGNOSIS — I10 Essential (primary) hypertension: Secondary | ICD-10-CM

## 2020-07-28 LAB — BASIC METABOLIC PANEL
BUN: 12 mg/dL (ref 6–23)
CO2: 28 mEq/L (ref 19–32)
Calcium: 9.9 mg/dL (ref 8.4–10.5)
Chloride: 104 mEq/L (ref 96–112)
Creatinine, Ser: 0.9 mg/dL (ref 0.40–1.20)
GFR: 63.14 mL/min (ref >60.00)
Glucose, Bld: 118 mg/dL — ABNORMAL HIGH (ref 70–99)
Potassium: 3.7 mEq/L (ref 3.5–5.1)
Sodium: 137 mEq/L (ref 135–145)

## 2020-08-06 ENCOUNTER — Other Ambulatory Visit: Payer: Self-pay | Admitting: Internal Medicine

## 2020-09-01 ENCOUNTER — Encounter: Payer: Self-pay | Admitting: Internal Medicine

## 2020-09-01 ENCOUNTER — Ambulatory Visit: Payer: Medicare Other | Admitting: Internal Medicine

## 2020-09-01 VITALS — BP 130/80 | HR 64 | Ht 63.0 in | Wt 167.0 lb

## 2020-09-01 DIAGNOSIS — Z86010 Personal history of colonic polyps: Secondary | ICD-10-CM | POA: Diagnosis not present

## 2020-09-01 DIAGNOSIS — R1310 Dysphagia, unspecified: Secondary | ICD-10-CM | POA: Diagnosis not present

## 2020-09-01 DIAGNOSIS — K219 Gastro-esophageal reflux disease without esophagitis: Secondary | ICD-10-CM

## 2020-09-01 DIAGNOSIS — K222 Esophageal obstruction: Secondary | ICD-10-CM | POA: Diagnosis not present

## 2020-09-01 MED ORDER — SUTAB 1479-225-188 MG PO TABS: 1.0000 | ORAL_TABLET | Freq: Once | ORAL | 0 refills | Status: AC

## 2020-09-01 NOTE — Progress Notes (Signed)
HISTORY OF PRESENT ILLNESS:  Kelly Santos is a 74 y.o. female, recent great-grandmother, with past medical history as listed below.  I have seen the patient for history of adenomatous colon polyps and iron deficiency anemia.  She was last seen September 29, 2015 when she underwent colonoscopy and upper endoscopy to evaluate iron deficiency anemia.  Colonoscopy revealed an adenomatous colon polyp which was removed.  As well moderate pandiverticulosis.  Follow-up in 5 years recommended.  Upper endoscopy revealed a hiatal hernia associated with Kelly Santos erosions.  This was determined to be the cause of her iron deficiency anemia.  Was also noted to have an incidental esophageal stricture.  Patient was recently evaluated for chest pain.  Negative work-up.  CT scan June 06, 2020 revealed no acute abnormalities.  Review of blood work from that same date revealed hemoglobin 13.4.  Normal liver tests.  Normal lipase.  She was subsequently seen by Dr. Alain Marion and placed on pantoprazole.  She does have chronic dyspeptic symptoms which have improved.  She was also reporting problems with intermittent dysphagia thick liquids had occasional solids.  She feels this may have improved as well.  GI review of systems is otherwise remarkable for occasional constipation.  No bleeding.  She has not been vaccinated against Covid but has been encouraged by her PCP.  REVIEW OF SYSTEMS:  All non-GI ROS negative unless otherwise stated in the HPI.  Past Medical History:  Diagnosis Date  . Anemia   . CAD (coronary artery disease)    no cardiologist, followed by PCP only  . Cataract   . Diverticulosis   . Generalized headaches   . GERD (gastroesophageal reflux disease)   . Glaucoma   . Hemorrhoids   . Hiatal hernia    On CT done Jan 31, 2011   . Hx of colonic polyps   . Hypertension   . Hyperthyroidism   . Osteoarthritis   . Uterine polyp   . Vitamin D deficiency     Past Surgical History:  Procedure  Laterality Date  . ABDOMINAL HYSTERECTOMY N/A 04/26/2013   Procedure: HYSTERECTOMY ABDOMINAL;  Surgeon: Cyril Mourning, MD;  Location: [facility];  Service: Gynecology;  Laterality: N/A;  . DILATION AND CURETTAGE OF UTERUS  01/18/2013   with uterine polypectomy  . SALPINGOOPHORECTOMY Bilateral 04/26/2013   Procedure: SALPINGO OOPHORECTOMY;  Surgeon: Cyril Mourning, MD;  Location: [facility];  Service: Gynecology;  Laterality: Bilateral;    Social History [name]  reports that she has never smoked. She has never used smokeless tobacco. She reports that she does not drink alcohol and does not use drugs.  family history includes CVA in her brother; Deep vein thrombosis (age of onset: 37) in her son; Diabetes in her brother; Heart failure in her father; Hypertension in her brother, father, mother, and sister.  Allergies  Allergen Reactions  . Amlodipine     edema  . Hctz [Hydrochlorothiazide]     Hypokalemia - resolved off HCTZ  . Metoprolol     fatigue       PHYSICAL EXAMINATION: Vital signs: BP 130/80   Pulse 64   Ht 5\' 3"  (1.6 m)   Wt 167 lb (75.8 kg)   BMI 29.58 kg/m   Constitutional: generally well-appearing, no acute distress Psychiatric: alert and oriented x3, cooperative Eyes: extraocular movements intact, anicteric, conjunctiva pink Mouth: Mask Neck: supple no lymphadenopathy Cardiovascular: heart regular rate and rhythm, no murmur Lungs: clear to auscultation bilaterally Abdomen: soft, nontender, nondistended,  no obvious ascites, no peritoneal signs, normal bowel sounds, no organomegaly Rectal: Deferred to colonoscopy Extremities: no clubbing, cyanosis, or lower extremity edema bilaterally Skin: no lesions on visible extremities Neuro: No focal deficits.  Cranial nerves intact  ASSESSMENT:  1.  Intermittent dysphagia due to known esophageal stricture 2.  GERD.  Dyspeptic symptoms improved on PPI 3.  History of adenomatous colon polyp.  Due for  surveillance   PLAN:  1.  Reflux precautions 2.  Continue pantoprazole 40 mg daily 3.  Schedule upper endoscopy with esophageal dilation.The nature of the procedure, as well as the risks, benefits, and alternatives were carefully and thoroughly reviewed with the patient. Ample time for discussion and questions allowed. The patient understood, was satisfied, and agreed to proceed. 4.  Schedule surveillance colonoscopy.The nature of the procedure, as well as the risks, benefits, and alternatives were carefully and thoroughly reviewed with the patient. Ample time for discussion and questions allowed. The patient understood, was satisfied, and agreed to proceed.

## 2020-09-01 NOTE — Patient Instructions (Signed)
If you are age 74 or older, your body mass index should be between 23-30. Your Body mass index is 29.58 kg/m. If this is out of the aforementioned range listed, please consider follow up with your Primary Care Provider.  If you are age 28 or younger, your body mass index should be between 19-25. Your Body mass index is 29.58 kg/m. If this is out of the aformentioned range listed, please consider follow up with your Primary Care Provider.    You have been scheduled for an endoscopy and colonoscopy. Please follow the written instructions given to you at your visit today. Please pick up your prep supplies at the pharmacy within the next 1-3 days. If you use inhalers (even only as needed), please bring them with you on the day of your procedure.

## 2020-09-11 ENCOUNTER — Other Ambulatory Visit: Payer: Self-pay | Admitting: Family

## 2020-09-17 DIAGNOSIS — Z20822 Contact with and (suspected) exposure to covid-19: Secondary | ICD-10-CM | POA: Diagnosis not present

## 2020-09-18 ENCOUNTER — Other Ambulatory Visit: Payer: Self-pay | Admitting: Internal Medicine

## 2020-09-19 ENCOUNTER — Telehealth: Payer: Self-pay | Admitting: Internal Medicine

## 2020-09-19 NOTE — Telephone Encounter (Signed)
I think I have replied to this message already.  It would be better if she stops using ibuprofen, because your kidney function has decreased some compared to previous.  It is still normal.  She should try using Tylenol instead.  Thanks

## 2020-09-19 NOTE — Telephone Encounter (Signed)
Patient called and was wondering if Dr. Alain Marion could authorize the refill for ibuprofen (ADVIL) 600 MG tablet It can be sent to [id number] - , [place].   The patient is requesting a call back in regards to her getting the Covid 19 vaccination.   She can be reached at [phone].

## 2020-09-20 NOTE — Telephone Encounter (Signed)
Called pt no answer LMOM w/MD response../lmb 

## 2020-09-30 ENCOUNTER — Ambulatory Visit (HOSPITAL_COMMUNITY)
Admission: EM | Admit: 2020-09-30 | Discharge: 2020-09-30 | Disposition: A | Discharge status: Home / Self Care | Payer: Medicare Other

## 2020-09-30 ENCOUNTER — Other Ambulatory Visit: Payer: Self-pay

## 2020-09-30 ENCOUNTER — Encounter (HOSPITAL_COMMUNITY): Payer: Self-pay | Admitting: Emergency Medicine

## 2020-09-30 ENCOUNTER — Emergency Department (HOSPITAL_COMMUNITY): Payer: Medicare Other

## 2020-09-30 ENCOUNTER — Emergency Department (HOSPITAL_COMMUNITY)
Admission: EM | Admit: 2020-09-30 | Discharge: 2020-09-30 | Disposition: A | Discharge status: Home / Self Care | Payer: Medicare Other | Attending: Emergency Medicine | Admitting: Emergency Medicine

## 2020-09-30 ENCOUNTER — Ambulatory Visit (HOSPITAL_COMMUNITY): Admit: 2020-09-30 | Discharge status: Absent without leave | Payer: Medicare Other

## 2020-09-30 DIAGNOSIS — Z79899 Other long term (current) drug therapy: Secondary | ICD-10-CM | POA: Diagnosis not present

## 2020-09-30 DIAGNOSIS — R519 Headache, unspecified: Secondary | ICD-10-CM | POA: Diagnosis not present

## 2020-09-30 DIAGNOSIS — I2510 Atherosclerotic heart disease of native coronary artery without angina pectoris: Secondary | ICD-10-CM | POA: Insufficient documentation

## 2020-09-30 DIAGNOSIS — D170 Benign lipomatous neoplasm of skin and subcutaneous tissue of head, face and neck: Secondary | ICD-10-CM | POA: Diagnosis not present

## 2020-09-30 DIAGNOSIS — R600 Localized edema: Secondary | ICD-10-CM | POA: Diagnosis not present

## 2020-09-30 DIAGNOSIS — I10 Essential (primary) hypertension: Secondary | ICD-10-CM | POA: Diagnosis not present

## 2020-09-30 LAB — BASIC METABOLIC PANEL
Anion gap: 7 (ref 5–15)
BUN: 12 mg/dL (ref 8–23)
CO2: 27 mmol/L (ref 22–32)
Calcium: 9.8 mg/dL (ref 8.9–10.3)
Chloride: 105 mmol/L (ref 98–111)
Creatinine, Ser: 0.88 mg/dL (ref 0.44–1.00)
GFR, Estimated: >60 mL/min (ref >60)
Glucose, Bld: 108 mg/dL — ABNORMAL HIGH (ref 70–99)
Potassium: 3.6 mmol/L (ref 3.5–5.1)
Sodium: 139 mmol/L (ref 135–145)

## 2020-09-30 LAB — CBC
HCT: 38 % (ref 36.0–46.0)
Hemoglobin: 12.9 g/dL (ref 12.0–15.0)
MCH: 33.9 pg (ref 26.0–34.0)
MCHC: 33.9 g/dL (ref 30.0–36.0)
MCV: 99.7 fL (ref 80.0–100.0)
Platelets: 246 10*3/uL (ref 150–400)
RBC: 3.81 MIL/uL — ABNORMAL LOW (ref 3.87–5.11)
RDW: 12.3 % (ref 11.5–15.5)
WBC: 5.9 10*3/uL (ref 4.0–10.5)
nRBC: 0 % (ref 0.0–0.2)

## 2020-09-30 MED ORDER — IOHEXOL 350 MG/ML SOLN
75.0000 mL | Freq: Once | INTRAVENOUS | Status: AC | PRN
  Administered 2020-09-30: 75 mL via INTRAVENOUS

## 2020-09-30 MED ORDER — METOCLOPRAMIDE HCL 5 MG/ML IJ SOLN
10.0000 mg | Freq: Once | INTRAMUSCULAR | Status: AC
  Administered 2020-09-30: 10 mg via INTRAVENOUS
  Filled 2020-09-30: qty 2

## 2020-09-30 MED ORDER — CLONIDINE HCL 0.1 MG PO TABS
0.1000 mg | ORAL_TABLET | Freq: Once | ORAL | Status: AC
  Administered 2020-09-30: 0.1 mg via ORAL
  Filled 2020-09-30: qty 1

## 2020-09-30 NOTE — ED Triage Notes (Signed)
PT C/O: itnermittent headache onset yesterday associated w/mass on left side of neck/shoulder  DENIES: inj/trauma  TAKING MEDS: OTC Ibuprofen   A&O x4... NAD... Ambulatory

## 2020-09-30 NOTE — ED Provider Notes (Addendum)
[facility] EMERGENCY DEPARTMENT Provider Note   CSN: [id number] Arrival date & time: 09/30/20  1316     History Chief Complaint  Patient presents with  . Headache    Kelly Santos is a 75 y.o. female.  HPI    75 year old female comes in a chief complaint of headache. She has history of blood clot, hypertension, CAD.  She reports that yesterday afternoon she started having a headache.  The headache started in her neck and moved up to the right side of her head and behind her right eye.  The headache was not thunderclap, but it got severe fairly quickly.  At nighttime the headache improved with warm compresses and she woke up headache free, however later in the morning the headache restarted again.  She has not taken any OTC medications.  She went to an urgent care, and was advised to come to the ER.  Patient denies any heavy smoking, drinking, family history of brain aneurysm, brain tumor, brain bleeds.  Patient has no history of blood clots in her legs or lungs.  She denies any associated neurologic symptoms such as unilateral numbness, weakness, vision change, dizziness, slurred speech.  Review of system is positive for some swelling on the right side.  Patient states that about a week ago or so she noted a bump on the right supraclavicular region.  Since then it appears that that side is slightly swollen compared to the contralateral side.  She denies any pain in her right upper extremity or swelling in her right upper extremity.  No history of cancer, night sweats.  Past Medical History:  Diagnosis Date  . Anemia   . CAD (coronary artery disease)    no cardiologist, followed by PCP only  . Cataract   . Diverticulosis   . Generalized headaches   . GERD (gastroesophageal reflux disease)   . Glaucoma   . Hemorrhoids   . Hiatal hernia    On CT done Jan 31, 2011   . Hx of colonic polyps   . Hypertension   . Hyperthyroidism   . Osteoarthritis   . Uterine polyp    . Vitamin D deficiency     Patient Active Problem List   Diagnosis Date Noted  . Hiatal hernia 06/09/2020  . Dysphagia 06/09/2020  . Chronic venous insufficiency 03/15/2020  . New daily persistent headache 08/25/2019  . Paresthesia 08/25/2019  . Foot pain, right 03/24/2019  . Epigastric pain 07/08/2018  . Oral ulcer 05/22/2018  . Neck pain 05/08/2018  . Pleural effusion 05/08/2018  . RLQ abdominal pain 11/14/2017  . Nausea 11/14/2017  . Edema 02/28/2017  . Acute upper respiratory infection 05/07/2016  . Cough 05/07/2016  . 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 Fatigue 11/02/2015  . Hyperglycemia 03/24/2015  . Well adult exam 11/27/2012  . Colon polyp, hyperplastic 11/27/2012  . Low back pain 08/03/2012  . Foot pain, left 04/14/2012  . Callus of foot 04/14/2012  . Leg pain, left 07/09/2011  . Diastolic dysfunction 05/15/2011  . Hypokalemia 05/15/2011  . Thyrotoxicosis 03/26/2010  . RECTAL BLEEDING 03/26/2010  . HEMORRHOIDS 03/23/2010  . Anemia due to chronic blood loss 03/23/2010  . HERPES ZOSTER 11/09/2009  . Febrile illness, acute 11/03/2009  . Pain in limb 10/04/2009  . Headache 10/04/2009  . TINEA PEDIS 06/20/2009  . ALLERGIC RHINITIS 12/15/2008  . Chest pain, atypical 11/08/2008  . CERVICAL RADICULOPATHY, RIGHT 03/25/2008  . KNEE PAIN, ACUTE 01/18/2008  . Vitamin D deficiency 11/21/2007  .  ABNORMAL CHEST XRAY 11/21/2007  . Essential hypertension 11/18/2007  . GERD 11/18/2007  . DIVERTICULOSIS, COLON 11/18/2007  . OSTEOARTHRITIS 11/18/2007    Past Surgical History:  Procedure Laterality Date  . ABDOMINAL HYSTERECTOMY N/A 04/26/2013   Procedure: HYSTERECTOMY ABDOMINAL;  Surgeon: Cyril Mourning, MD;  Location: [facility];  Service: Gynecology;  Laterality: N/A;  . DILATION AND CURETTAGE OF UTERUS  01/18/2013   with uterine polypectomy  . SALPINGOOPHORECTOMY Bilateral 04/26/2013   Procedure: SALPINGO OOPHORECTOMY;  Surgeon: Cyril Mourning, MD;  Location: [facility];   Service: Gynecology;  Laterality: Bilateral;     OB History   No obstetric history on file.     Family History  Problem Relation Age of Onset  . Hypertension Mother   . Hypertension Father   . Heart failure Father   . Deep vein thrombosis Son 35       x 2, chronic coumadin  . Hypertension Sister   . CVA Brother   . Diabetes Brother   . Hypertension Brother   . Colon cancer Neg Hx     Social History   Tobacco Use  . Smoking status: Never Smoker  . Smokeless tobacco: Never Used  Vaping Use  . Vaping Use: Never used  Substance Use Topics  . Alcohol use: No    Alcohol/week: 0.0 standard drinks  . Drug use: No    Home Medications Prior to Admission medications   Medication Sig Start Date End Date Taking? Authorizing Provider  amitriptyline (ELAVIL) 25 MG tablet Take 1 tablet (25 mg total) by mouth at bedtime as needed for sleep. Due for routine OV 04/24/20   Plotnikov, Evie Lacks, MD  BREO ELLIPTA 100-25 MCG/INH AEPB INHALE 1 PUFF INTO THE LUNGS EVERY DAY 08/07/20   Plotnikov, Evie Lacks, MD  candesartan (ATACAND) 32 MG tablet Take 1 tablet (32 mg total) by mouth daily. 03/17/20   Plotnikov, Evie Lacks, MD  cholecalciferol (VITAMIN D) 1000 UNITS tablet Take 1,000 Units by mouth every other day.     [provider]  COMBIGAN 0.2-0.5 % ophthalmic solution INSTILL 1 DROP INTO BOTH EYES TWICE A DAY 07/23/18   Plotnikov, Evie Lacks, MD  Ferrous Sulfate (IRON) 325 (65 FE) MG TABS Take 1 tablet by mouth 3 (three) times a week.     [provider]  hydrocortisone (PROCTOCORT) 1 % CREA Apply 1 application topically 2 (two) times daily as needed (dry skin).     [provider]  ibuprofen (ADVIL) 600 MG tablet Take 600 mg by mouth every 6 (six) hours as needed.    [provider]  pantoprazole (PROTONIX) 40 MG tablet Take 1 tablet (40 mg total) by mouth daily. 06/09/20   Plotnikov, Evie Lacks, MD  spironolactone (ALDACTONE) 25 MG tablet Take 1 tablet (25 mg  total) by mouth daily. 06/09/20   Plotnikov, Evie Lacks, MD  triamcinolone cream (KENALOG) 0.5 % APPLY TOPICALLY 2 (TWO) TIMES DAILY AS NEEDED. 09/12/20   Plotnikov, Evie Lacks, MD    Allergies    Amlodipine, Hctz [hydrochlorothiazide], and Metoprolol  Review of Systems   Review of Systems  Constitutional: Positive for activity change.  Eyes: Negative for visual disturbance.  Gastrointestinal: Negative for nausea and vomiting.  Neurological: Positive for headaches.  All other systems reviewed and are negative.   Physical Exam Updated Vital Signs BP 135/80 (BP Location: Right Arm)   Pulse 64   Temp 98.7 F (37.1 C) (Oral)   Resp 17   SpO2 100%  Physical Exam Vitals and nursing note reviewed.  Constitutional:      Appearance: She is well-developed and well-nourished.  HENT:     Head: Normocephalic and atraumatic.  Eyes:     Extraocular Movements: Extraocular movements intact and EOM normal.     Pupils: Pupils are equal, round, and reactive to light.  Neck:     Comments: No meningismus Cardiovascular:     Rate and Rhythm: Normal rate and regular rhythm.     Heart sounds: Normal heart sounds.     Comments: 2+ radial pulse bilaterally Pulmonary:     Effort: Pulmonary effort is normal. No respiratory distress.  Abdominal:     General: There is no distension.     Palpations: Abdomen is soft.     Tenderness: There is no abdominal tenderness. There is no guarding or rebound.  Musculoskeletal:     Cervical back: Neck supple.     Comments: Mild edema on the right side of the neck, supraclavicular region noted to  Skin:    General: Skin is warm and dry.  Neurological:     Mental Status: She is alert and oriented to person, place, and time.     GCS: GCS eye subscore is 4. GCS verbal subscore is 5. GCS motor subscore is 6.     Cranial Nerves: No cranial nerve deficit.     Sensory: No sensory deficit.     ED Results / Procedures / Treatments   Labs (all labs ordered are  listed, but only abnormal results are displayed) Labs Reviewed  CBC - Abnormal; Notable for the following components:      Result Value   RBC 3.81 (*)    All other components within normal limits  BASIC METABOLIC PANEL - Abnormal; Notable for the following components:   Glucose, Bld 108 (*)    All other components within normal limits    EKG None  Radiology CT Angio Head W or Wo Contrast  Result Date: 09/30/2020 CLINICAL DATA:  Headache. Carotid stenosis screening. Rule out dural venous thrombosis. EXAM: CT ANGIOGRAPHY HEAD AND NECK TECHNIQUE: Multidetector CT imaging of the head and neck was performed using the standard protocol during bolus administration of intravenous contrast. Multiplanar CT image reconstructions and MIPs were obtained to evaluate the vascular anatomy. Carotid stenosis measurements (when applicable) are obtained utilizing NASCET criteria, using the distal internal carotid diameter as the denominator. CONTRAST:  38mL OMNIPAQUE IOHEXOL 350 MG/ML SOLN COMPARISON:  CT head 05/08/2011 FINDINGS: CT HEAD FINDINGS Brain: Ventricle size normal. Moderate white matter changes with patchy white matter hypodensity bilaterally, with progression from the prior study. Negative for acute cortical infarct, hemorrhage, mass. Vascular: Negative for hyperdense vessel Skull: Negative Sinuses: Air-fluid level in the maxillary sinus bilaterally. Orbits: Bilateral cataract extraction. Review of the MIP images confirms the above findings CTA NECK FINDINGS Aortic arch: Standard branching. Imaged portion shows no evidence of aneurysm or dissection. No significant stenosis of the major arch vessel origins. Right carotid system: Minimal atherosclerotic disease right carotid bifurcation without stenosis Left carotid system: Left carotid normal and widely patent without stenosis Vertebral arteries: Both vertebral arteries widely patent without stenosis. Skeleton: Negative Other neck: Thyroid goiter with  heterogeneous enhancement. 1 cm nodule upper pole bilaterally. No further imaging necessary. Upper chest: Negative Review of the MIP images confirms the above findings CTA HEAD FINDINGS Anterior circulation: Cavernous carotid normal bilaterally without stenosis. Anterior and middle cerebral arteries normal bilaterally. Posterior circulation: Both vertebral arteries patent to the basilar. Right  PICA patent. Left PICA not visualized. Left AICA patent. Basilar widely patent. Superior cerebellar and posterior cerebral arteries normal bilaterally. Venous sinuses: Normal venous enhancement. Right transverse sinus dominant. Anatomic variants: None Review of the MIP images confirms the above findings IMPRESSION: 1. CT head negative for acute infarct. Patchy white matter hypodensity bilaterally most consistent with microvascular ischemia 2. Negative CTA head neck. No significant stenosis. Negative for intracranial large vessel occlusion. Electronically Signed   By: Franchot Gallo M.D.   On: 09/30/2020 19:49   DG Chest 2 View  Result Date: 09/30/2020 CLINICAL DATA:  Headache swelling to right side of neck EXAM: CHEST - 2 VIEW COMPARISON:  06/06/2020, CT 06/06/2020 FINDINGS: No focal opacity or pleural effusion. Stable slightly enlarged cardiomediastinal silhouette. No pneumothorax. IMPRESSION: Cardiomegaly.  Otherwise negative. Electronically Signed   By: Donavan Foil M.D.   On: 09/30/2020 21:11   CT Angio Neck W and/or Wo Contrast  Result Date: 09/30/2020 CLINICAL DATA:  Headache. Carotid stenosis screening. Rule out dural venous thrombosis. EXAM: CT ANGIOGRAPHY HEAD AND NECK TECHNIQUE: Multidetector CT imaging of the head and neck was performed using the standard protocol during bolus administration of intravenous contrast. Multiplanar CT image reconstructions and MIPs were obtained to evaluate the vascular anatomy. Carotid stenosis measurements (when applicable) are obtained utilizing NASCET criteria, using the  distal internal carotid diameter as the denominator. CONTRAST:  55mL OMNIPAQUE IOHEXOL 350 MG/ML SOLN COMPARISON:  CT head 05/08/2011 FINDINGS: CT HEAD FINDINGS Brain: Ventricle size normal. Moderate white matter changes with patchy white matter hypodensity bilaterally, with progression from the prior study. Negative for acute cortical infarct, hemorrhage, mass. Vascular: Negative for hyperdense vessel Skull: Negative Sinuses: Air-fluid level in the maxillary sinus bilaterally. Orbits: Bilateral cataract extraction. Review of the MIP images confirms the above findings CTA NECK FINDINGS Aortic arch: Standard branching. Imaged portion shows no evidence of aneurysm or dissection. No significant stenosis of the major arch vessel origins. Right carotid system: Minimal atherosclerotic disease right carotid bifurcation without stenosis Left carotid system: Left carotid normal and widely patent without stenosis Vertebral arteries: Both vertebral arteries widely patent without stenosis. Skeleton: Negative Other neck: Thyroid goiter with heterogeneous enhancement. 1 cm nodule upper pole bilaterally. No further imaging necessary. Upper chest: Negative Review of the MIP images confirms the above findings CTA HEAD FINDINGS Anterior circulation: Cavernous carotid normal bilaterally without stenosis. Anterior and middle cerebral arteries normal bilaterally. Posterior circulation: Both vertebral arteries patent to the basilar. Right PICA patent. Left PICA not visualized. Left AICA patent. Basilar widely patent. Superior cerebellar and posterior cerebral arteries normal bilaterally. Venous sinuses: Normal venous enhancement. Right transverse sinus dominant. Anatomic variants: None Review of the MIP images confirms the above findings IMPRESSION: 1. CT head negative for acute infarct. Patchy white matter hypodensity bilaterally most consistent with microvascular ischemia 2. Negative CTA head neck. No significant stenosis. Negative for  intracranial large vessel occlusion. Electronically Signed   By: Franchot Gallo M.D.   On: 09/30/2020 19:49   CT VENOGRAM HEAD  Result Date: 09/30/2020 CLINICAL DATA:  Headache.  Rule out dural venous sinus thrombosis. EXAM: CT VENOGRAM HEAD TECHNIQUE: Delayed venous phase scanning was obtained through the head following CT angio head and neck. CONTRAST:  77mL OMNIPAQUE IOHEXOL 350 MG/ML SOLN COMPARISON:  CT angio head neck 09/30/2020 FINDINGS: Superior sagittal sinus widely patent and normal. Internal cerebral veins patent. Straight sinus patent. Dominant right transverse sinus. No transverse sinus thrombosis. Hypoplastic left transverse sinus. IMPRESSION: Negative for dural venous sinus thrombosis. Electronically  Signed   By: Franchot Gallo M.D.   On: 09/30/2020 19:51    Procedures Procedures (including critical care time)  Medications Ordered in ED Medications  iohexol (OMNIPAQUE) 350 MG/ML injection 75 mL (75 mLs Intravenous Contrast Given 09/30/20 1920)  cloNIDine (CATAPRES) tablet 0.1 mg (0.1 mg Oral Given 09/30/20 2106)  metoCLOPramide (REGLAN) injection 10 mg (10 mg Intravenous Given 09/30/20 2115)    ED Course  I have reviewed the triage vital signs and the nursing notes.  Pertinent labs & imaging results that were available during my care of the patient were reviewed by me and considered in my medical decision making (see chart for details).  Clinical Course as of 09/30/20 2240  Sat Sep 30, 2020  2156 BP(!): 197/103 Patient reports that she was prescribed.  Clonidine.  She usually does not need to take it.  I will give her 0.1 mg of clonidine.  Also give her IV Reglan for headache.  Anticipate discharge. [AN]  2156 CT VENOGRAM HEAD Results of the CT scan discussed with the patient.  Patient does not have any aneurysm, dissection, venous thrombosis, tumor, bleeding.  Patient reports that her headache has pretty much dissipated now.  Informed her that she will still need to see a  neurologist -to evaluate for complex headache.  Doubt migraine given her age.  Does not appear to be trigeminal neuralgia type.  Patient wants to figure out what the headache is and is comfortable following with the neurologist. [AN]    Clinical Course User Index [AN] Varney Biles, MD   MDM Rules/Calculators/A&P                          75 year old female comes in a chief complaint of headache.  She does not have history of headaches.  Headache started yesterday and has been fairly constant since then and described as severe at its peak.  There is no associated neurologic symptoms but patient reports some swelling on the right side of her neck/upper chest.  She has no risk factors for aneurysm, family history is reassuring.  We will get CT angiogram head and neck to rule out aneurysm, brain bleed and also get CT venogram to make sure there is no thrombosis.  Final Clinical Impression(s) / ED Diagnoses Final diagnoses:  Acute nonintractable headache, unspecified headache type    Rx / DC Orders ED Discharge Orders    None       Varney Biles, MD 09/30/20 [id number]    Varney Biles, MD 09/30/20 2240

## 2020-09-30 NOTE — Discharge Instructions (Addendum)
We saw you in the ER for headaches. All the labs and imaging are normal. We are not sure what is causing your headaches, however, there appears to be no evidence of infection, bleeds or tumors based on our exam and results.  Please take motrin every 6 hours for the next 2 days. Consider seeing the neurologist for their opinion on the headache, especially if it does not resolve completely  Please return to the ER if the headache gets severe and in not improving, you have associated new one sided numbness, tingling, weakness or confusion, seizures, poor balance or poor vision.

## 2020-09-30 NOTE — Discharge Instructions (Addendum)
Head straight to [facility]. If you develop chest pain, shortness of breath, etc on the way to the ED- immediately call EMS.

## 2020-09-30 NOTE — ED Provider Notes (Signed)
[place]    CSN: [id number] Arrival date & time: 09/30/20  1033      History   Chief Complaint Chief Complaint  Patient presents with  . Headache    HPI Kelly Santos is a 75 y.o. female presenting for headache x1 day. History of CAD, headaches, GERD, hemorrhoids, hypertension. States she's having the worst headache of her life for 1 day. Rates as a 10/10 this morning but 8/10 following ibuprofen. Adamantly insisting on knowing the cause of this headache. Denies thunderclap headache, weakness/sensation changes in arms/legs, vision changes, shortness of breath, chest pain/pressure, photophobia, phonophobia, n/v/d. Also states she noticed lump at base of her neck that's somewhat painful. She is concerned about this and worries it might be an aneurysm.   HPI  Past Medical History:  Diagnosis Date  . Anemia   . CAD (coronary artery disease)    no cardiologist, followed by PCP only  . Cataract   . Diverticulosis   . Generalized headaches   . GERD (gastroesophageal reflux disease)   . Glaucoma   . Hemorrhoids   . Hiatal hernia    On CT done Jan 31, 2011   . Hx of colonic polyps   . Hypertension   . Hyperthyroidism   . Osteoarthritis   . Uterine polyp   . Vitamin D deficiency     Patient Active Problem List   Diagnosis Date Noted  . Hiatal hernia 06/09/2020  . Dysphagia 06/09/2020  . Chronic venous insufficiency 03/15/2020  . New daily persistent headache 08/25/2019  . Paresthesia 08/25/2019  . Foot pain, right 03/24/2019  . Epigastric pain 07/08/2018  . Oral ulcer 05/22/2018  . Neck pain 05/08/2018  . Pleural effusion 05/08/2018  . RLQ abdominal pain 11/14/2017  . Nausea 11/14/2017  . Edema 02/28/2017  . Acute upper respiratory infection 05/07/2016  . Cough 05/07/2016  . 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 Fatigue 11/02/2015  . Hyperglycemia 03/24/2015  . Well adult exam 11/27/2012  . Colon polyp, hyperplastic 11/27/2012  . Low back pain 08/03/2012  .  Foot pain, left 04/14/2012  . Callus of foot 04/14/2012  . Leg pain, left 07/09/2011  . Diastolic dysfunction 0000000  . Hypokalemia 05/15/2011  . Thyrotoxicosis 03/26/2010  . RECTAL BLEEDING 03/26/2010  . HEMORRHOIDS 03/23/2010  . Anemia due to chronic blood loss 03/23/2010  . HERPES ZOSTER 11/09/2009  . Febrile illness, acute 11/03/2009  . Pain in limb 10/04/2009  . Headache 10/04/2009  . TINEA PEDIS 06/20/2009  . ALLERGIC RHINITIS 12/15/2008  . Chest pain, atypical 11/08/2008  . CERVICAL RADICULOPATHY, RIGHT 03/25/2008  . KNEE PAIN, ACUTE 01/18/2008  . Vitamin D deficiency 11/21/2007  . ABNORMAL CHEST XRAY 11/21/2007  . Essential hypertension 11/18/2007  . GERD 11/18/2007  . DIVERTICULOSIS, COLON 11/18/2007  . OSTEOARTHRITIS 11/18/2007    Past Surgical History:  Procedure Laterality Date  . ABDOMINAL HYSTERECTOMY N/A 04/26/2013   Procedure: HYSTERECTOMY ABDOMINAL;  Surgeon: Cyril Mourning, MD;  Location: [facility];  Service: Gynecology;  Laterality: N/A;  . DILATION AND CURETTAGE OF UTERUS  01/18/2013   with uterine polypectomy  . SALPINGOOPHORECTOMY Bilateral 04/26/2013   Procedure: SALPINGO OOPHORECTOMY;  Surgeon: Cyril Mourning, MD;  Location: [facility];  Service: Gynecology;  Laterality: Bilateral;    OB History   No obstetric history on file.      Home Medications    Prior to Admission medications   Medication Sig Start Date End Date Taking? Authorizing Provider  amitriptyline (ELAVIL) 25  MG tablet Take 1 tablet (25 mg total) by mouth at bedtime as needed for sleep. Due for routine OV 04/24/20   Plotnikov, Georgina Quint, MD  BREO ELLIPTA 100-25 MCG/INH AEPB INHALE 1 PUFF INTO THE LUNGS EVERY DAY 08/07/20   Plotnikov, Georgina Quint, MD  candesartan (ATACAND) 32 MG tablet Take 1 tablet (32 mg total) by mouth daily. 03/17/20   Plotnikov, Georgina Quint, MD  cholecalciferol (VITAMIN D) 1000 UNITS tablet Take 1,000 Units by mouth every other day.     [provider]   COMBIGAN 0.2-0.5 % ophthalmic solution INSTILL 1 DROP INTO BOTH EYES TWICE A DAY 07/23/18   Plotnikov, Georgina Quint, MD  Ferrous Sulfate (IRON) 325 (65 FE) MG TABS Take 1 tablet by mouth 3 (three) times a week.     [provider]  hydrocortisone (PROCTOCORT) 1 % CREA Apply 1 application topically 2 (two) times daily as needed (dry skin).     [provider]  ibuprofen (ADVIL) 600 MG tablet Take 600 mg by mouth every 6 (six) hours as needed.    [provider]  pantoprazole (PROTONIX) 40 MG tablet Take 1 tablet (40 mg total) by mouth daily. 06/09/20   Plotnikov, Georgina Quint, MD  spironolactone (ALDACTONE) 25 MG tablet Take 1 tablet (25 mg total) by mouth daily. 06/09/20   Plotnikov, Georgina Quint, MD  triamcinolone cream (KENALOG) 0.5 % APPLY TOPICALLY 2 (TWO) TIMES DAILY AS NEEDED. 09/12/20   Plotnikov, Georgina Quint, MD    Family History Family History  Problem Relation Age of Onset  . Hypertension Mother   . Hypertension Father   . Heart failure Father   . Deep vein thrombosis Son 35       x 2, chronic coumadin  . Hypertension Sister   . CVA Brother   . Diabetes Brother   . Hypertension Brother   . Colon cancer Neg Hx     Social History Social History   Tobacco Use  . Smoking status: Never Smoker  . Smokeless tobacco: Never Used  Vaping Use  . Vaping Use: Never used  Substance Use Topics  . Alcohol use: No    Alcohol/week: 0.0 standard drinks  . Drug use: No     Allergies   Amlodipine, Hctz [hydrochlorothiazide], and Metoprolol   Review of Systems Review of Systems  Neurological: Positive for headaches.  All other systems reviewed and are negative.    Physical Exam Triage Vital Signs ED Triage Vitals  Enc Vitals Group     BP 09/30/20 1157 (!) 178/100     Pulse Rate 09/30/20 1157 63     Resp 09/30/20 1157 16     Temp 09/30/20 1157 98.7 F (37.1 C)     Temp Source 09/30/20 1157 Oral     SpO2 09/30/20 1157 95 %     Weight --      Height --       Head Circumference --      Peak Flow --      Pain Score 09/30/20 1156 9     Pain Loc --      Pain Edu? --      Excl. in GC? --    No data found.  Updated Vital Signs BP (!) 178/100 (BP Location: Left Arm)   Pulse 63   Temp 98.7 F (37.1 C) (Oral)   Resp 16   SpO2 95%   Visual Acuity Right Eye Distance:   Left Eye Distance:   Bilateral Distance:  Right Eye Near:   Left Eye Near:    Bilateral Near:     Physical Exam Vitals reviewed.  Constitutional:      General: She is not in acute distress.    Appearance: Normal appearance. She is not ill-appearing.  HENT:     Head: Normocephalic and atraumatic.  Neck:      Comments: 3x4cm well circumscribed area of swelling at base of neck. nontender and mobile. Not erythematous warm or fluctuant.  Cardiovascular:     Rate and Rhythm: Normal rate and regular rhythm.     Heart sounds: Normal heart sounds.  Pulmonary:     Effort: Pulmonary effort is normal.     Breath sounds: Normal breath sounds. No wheezing, rhonchi or rales.  Musculoskeletal:     Cervical back: Normal range of motion and neck supple. No rigidity.  Lymphadenopathy:     Cervical: No cervical adenopathy.  Neurological:     General: No focal deficit present.     Mental Status: She is alert and oriented to person, place, and time. Mental status is at baseline.     Cranial Nerves: Cranial nerves are intact. No cranial nerve deficit or facial asymmetry.     Sensory: Sensation is intact. No sensory deficit.     Motor: Motor function is intact. No weakness.     Coordination: Coordination is intact. Coordination normal.     Gait: Gait is intact. Gait normal.     Comments: CN 2-12 intact. No weakness sensation changes or numbness in UEs or LEs.  Psychiatric:        Mood and Affect: Mood is anxious.        Behavior: Behavior normal.        Thought Content: Thought content normal.        Judgment: Judgment normal.      UC Treatments / Results  Labs (all  labs ordered are listed, but only abnormal results are displayed) Labs Reviewed - No data to display  EKG   Radiology No results found.  Procedures Procedures (including critical care time)  Medications Ordered in UC Medications - No data to display  Initial Impression / Assessment and Plan / UC Course  I have reviewed the triage vital signs and the nursing notes.  Pertinent labs & imaging results that were available during my care of the patient were reviewed by me and considered in my medical decision making (see chart for details).     For worst headache of life- head straight to ED. Benign neuro exam, CN 2-12 intact. Pt is hemodynamically stable to transport herself. BP elevated today at 178/100 (171/108 on recheck). States she took her BP medications this morning. Denies chest pain, SOB, vision changes, urinary sx.   Pt also presenting with what appears to be a lipoma at the base of her neck. Rec PCP f/u.   Final Clinical Impressions(s) / UC Diagnoses   Final diagnoses:  Worst headache of life  Lipoma of neck     Discharge Instructions     Head straight to [facility]. If you develop chest pain, shortness of breath, etc on the way to the ED- immediately call EMS.    ED Prescriptions    None     PDMP not reviewed this encounter.   Hazel Sams, PA-C 09/30/20 1302

## 2020-09-30 NOTE — ED Notes (Signed)
Patient is being discharged from the Urgent Care and sent to the Emergency Department via wheelchair . Per Vernona Rieger, [place], patient is in need of higher level of care due to worst headache of her life. Patient is aware and verbalizes understanding of plan of care.  Vitals:   09/30/20 1157  BP: (!) 178/100  Pulse: 63  Resp: 16  Temp: 98.7 F (37.1 C)  SpO2: 95%

## 2020-09-30 NOTE — ED Triage Notes (Signed)
Pt reports headache since yesterday. Pt has swelling to right side of neck and states it has been there for awhile. Sent from UC due to HA. No chest pain, SOB or vision trouble.

## 2020-10-04 ENCOUNTER — Other Ambulatory Visit: Payer: Self-pay

## 2020-10-04 ENCOUNTER — Ambulatory Visit (INDEPENDENT_AMBULATORY_CARE_PROVIDER_SITE_OTHER): Payer: Medicare Other | Admitting: Internal Medicine

## 2020-10-04 ENCOUNTER — Encounter: Payer: Self-pay | Admitting: Internal Medicine

## 2020-10-04 DIAGNOSIS — E559 Vitamin D deficiency, unspecified: Secondary | ICD-10-CM | POA: Diagnosis not present

## 2020-10-04 DIAGNOSIS — I10 Essential (primary) hypertension: Secondary | ICD-10-CM | POA: Diagnosis not present

## 2020-10-04 DIAGNOSIS — R519 Headache, unspecified: Secondary | ICD-10-CM

## 2020-10-04 DIAGNOSIS — E876 Hypokalemia: Secondary | ICD-10-CM | POA: Diagnosis not present

## 2020-10-04 MED ORDER — CLONIDINE HCL 0.1 MG PO TABS: 0.1000 mg | ORAL_TABLET | Freq: Two times a day (BID) | ORAL | 3 refills | Status: DC | PRN

## 2020-10-04 NOTE — Assessment & Plan Note (Signed)
No relapse so far.  Remain off HCTZ

## 2020-10-04 NOTE — Assessment & Plan Note (Addendum)
BP control CT angio brain - normal Cervical - ?occipital neuralgia on the R. Nerve block if needed.  ER notes are reviewed.

## 2020-10-04 NOTE — Progress Notes (Signed)
Subjective:  Patient ID: Kelly Santos, female    DOB: 13-Apr-1946  Age: 75 y.o. MRN: [id number]  CC: Lipoma (On (R) side of her neck)   HPI [name] presents for a f/u of severe R HA x 2 d, she went to ER on 09/30/20. CT was (-). Pain started from the neck and up to the forehead. CXR was ok. No HA, no CP now.  Follow-up on hypertension-blood pressure has been fairly normal at home  Outpatient Medications Prior to Visit  Medication Sig Dispense Refill  . amitriptyline (ELAVIL) 25 MG tablet Take 1 tablet (25 mg total) by mouth at bedtime as needed for sleep. Due for routine OV 30 tablet 5  . BREO ELLIPTA 100-25 MCG/INH AEPB INHALE 1 PUFF INTO THE LUNGS EVERY DAY 60 each 5  . candesartan (ATACAND) 32 MG tablet Take 1 tablet (32 mg total) by mouth daily. 90 tablet 3  . cholecalciferol (VITAMIN D) 1000 UNITS tablet Take 1,000 Units by mouth every other day.     . COMBIGAN 0.2-0.5 % ophthalmic solution INSTILL 1 DROP INTO BOTH EYES TWICE A DAY 5 mL 2  . Ferrous Sulfate (IRON) 325 (65 FE) MG TABS Take 1 tablet by mouth 3 (three) times a week.     . hydrocortisone (PROCTOCORT) 1 % CREA Apply 1 application topically 2 (two) times daily as needed (dry skin).     Marland Kitchen ibuprofen (ADVIL) 600 MG tablet Take 600 mg by mouth every 6 (six) hours as needed.    . pantoprazole (PROTONIX) 40 MG tablet Take 1 tablet (40 mg total) by mouth daily. 90 tablet 3  . spironolactone (ALDACTONE) 25 MG tablet Take 1 tablet (25 mg total) by mouth daily. 90 tablet 3  . triamcinolone cream (KENALOG) 0.5 % APPLY TOPICALLY 2 (TWO) TIMES DAILY AS NEEDED. 30 g 0   No facility-administered medications prior to visit.    ROS: Review of Systems  Constitutional: Negative for activity change, appetite change, chills, fatigue and unexpected weight change.  HENT: Negative for congestion, mouth sores and sinus pressure.   Eyes: Negative for visual disturbance.  Respiratory: Negative for cough and chest tightness.    Gastrointestinal: Negative for abdominal pain and nausea.  Genitourinary: Negative for difficulty urinating, frequency and vaginal pain.  Musculoskeletal: Positive for neck stiffness. Negative for back pain, gait problem and neck pain.  Skin: Negative for pallor and rash.  Neurological: Negative for dizziness, tremors, weakness, numbness and headaches.  Psychiatric/Behavioral: Negative for confusion and sleep disturbance.    Objective:  BP 140/82 (BP Location: Left Arm)   Pulse 74   Temp 99.3 F (37.4 C) (Oral)   Wt 163 lb (73.9 kg)   SpO2 97%   BMI 28.87 kg/m   BP Readings from Last 3 Encounters:  10/04/20 140/82  09/30/20 135/80  09/30/20 (!) 178/100    Wt Readings from Last 3 Encounters:  10/04/20 163 lb (73.9 kg)  09/01/20 167 lb (75.8 kg)  06/09/20 169 lb (76.7 kg)    Physical Exam Constitutional:      General: She is not in acute distress.    Appearance: She is well-developed.  HENT:     Head: Normocephalic.     Right Ear: External ear normal.     Left Ear: External ear normal.     Nose: Nose normal.     Mouth/Throat:     Mouth: Oropharynx is clear and moist.  Eyes:     General:  Right eye: No discharge.        Left eye: No discharge.     Conjunctiva/sclera: Conjunctivae normal.     Pupils: Pupils are equal, round, and reactive to light.  Neck:     Thyroid: No thyromegaly.     Vascular: No JVD.     Trachea: No tracheal deviation.  Cardiovascular:     Rate and Rhythm: Normal rate and regular rhythm.     Heart sounds: Normal heart sounds.  Pulmonary:     Effort: No respiratory distress.     Breath sounds: No stridor. No wheezing.  Abdominal:     General: Bowel sounds are normal. There is no distension.     Palpations: Abdomen is soft. There is no mass.     Tenderness: There is no abdominal tenderness. There is no guarding or rebound.  Musculoskeletal:        General: No tenderness or edema.     Cervical back: Normal range of motion and neck  supple.  Lymphadenopathy:     Cervical: No cervical adenopathy.  Skin:    Findings: No erythema or rash.  Neurological:     Cranial Nerves: No cranial nerve deficit.     Motor: No abnormal muscle tone.     Coordination: Coordination normal.     Deep Tendon Reflexes: Reflexes normal.  Psychiatric:        Mood and Affect: Mood and affect normal.        Behavior: Behavior normal.        Thought Content: Thought content normal.        Judgment: Judgment normal.   Neck with full range of motion.  Slightly tender at the point of right occipital nerve exit.  There is a slight fullness and asymmetry in the right supraclavicular fossa.  Nontender  Lab Results  Component Value Date   WBC 5.9 09/30/2020   HGB 12.9 09/30/2020   HCT 38.0 09/30/2020   PLT 246 09/30/2020   GLUCOSE 108 (H) 09/30/2020   CHOL 174 08/25/2019   TRIG 121.0 08/25/2019   HDL 46.80 08/25/2019   LDLCALC 103 (H) 08/25/2019   ALT 24 06/06/2020   AST 22 06/06/2020   NA 139 09/30/2020   K 3.6 09/30/2020   CL 105 09/30/2020   CREATININE 0.88 09/30/2020   BUN 12 09/30/2020   CO2 27 09/30/2020   TSH 0.65 01/26/2020   INR 0.96 05/09/2011   HGBA1C 5.8 (H) 01/23/2017    CT Angio Head W or Wo Contrast  Result Date: 09/30/2020 CLINICAL DATA:  Headache. Carotid stenosis screening. Rule out dural venous thrombosis. EXAM: CT ANGIOGRAPHY HEAD AND NECK TECHNIQUE: Multidetector CT imaging of the head and neck was performed using the standard protocol during bolus administration of intravenous contrast. Multiplanar CT image reconstructions and MIPs were obtained to evaluate the vascular anatomy. Carotid stenosis measurements (when applicable) are obtained utilizing NASCET criteria, using the distal internal carotid diameter as the denominator. CONTRAST:  65mL OMNIPAQUE IOHEXOL 350 MG/ML SOLN COMPARISON:  CT head 05/08/2011 FINDINGS: CT HEAD FINDINGS Brain: Ventricle size normal. Moderate white matter changes with patchy white matter  hypodensity bilaterally, with progression from the prior study. Negative for acute cortical infarct, hemorrhage, mass. Vascular: Negative for hyperdense vessel Skull: Negative Sinuses: Air-fluid level in the maxillary sinus bilaterally. Orbits: Bilateral cataract extraction. Review of the MIP images confirms the above findings CTA NECK FINDINGS Aortic arch: Standard branching. Imaged portion shows no evidence of aneurysm or dissection. No significant stenosis of  the major arch vessel origins. Right carotid system: Minimal atherosclerotic disease right carotid bifurcation without stenosis Left carotid system: Left carotid normal and widely patent without stenosis Vertebral arteries: Both vertebral arteries widely patent without stenosis. Skeleton: Negative Other neck: Thyroid goiter with heterogeneous enhancement. 1 cm nodule upper pole bilaterally. No further imaging necessary. Upper chest: Negative Review of the MIP images confirms the above findings CTA HEAD FINDINGS Anterior circulation: Cavernous carotid normal bilaterally without stenosis. Anterior and middle cerebral arteries normal bilaterally. Posterior circulation: Both vertebral arteries patent to the basilar. Right PICA patent. Left PICA not visualized. Left AICA patent. Basilar widely patent. Superior cerebellar and posterior cerebral arteries normal bilaterally. Venous sinuses: Normal venous enhancement. Right transverse sinus dominant. Anatomic variants: None Review of the MIP images confirms the above findings IMPRESSION: 1. CT head negative for acute infarct. Patchy white matter hypodensity bilaterally most consistent with microvascular ischemia 2. Negative CTA head neck. No significant stenosis. Negative for intracranial large vessel occlusion. Electronically Signed   By: Franchot Gallo M.D.   On: 09/30/2020 19:49   DG Chest 2 View  Result Date: 09/30/2020 CLINICAL DATA:  Headache swelling to right side of neck EXAM: CHEST - 2 VIEW COMPARISON:   06/06/2020, CT 06/06/2020 FINDINGS: No focal opacity or pleural effusion. Stable slightly enlarged cardiomediastinal silhouette. No pneumothorax. IMPRESSION: Cardiomegaly.  Otherwise negative. Electronically Signed   By: Donavan Foil M.D.   On: 09/30/2020 21:11   CT Angio Neck W and/or Wo Contrast  Result Date: 09/30/2020 CLINICAL DATA:  Headache. Carotid stenosis screening. Rule out dural venous thrombosis. EXAM: CT ANGIOGRAPHY HEAD AND NECK TECHNIQUE: Multidetector CT imaging of the head and neck was performed using the standard protocol during bolus administration of intravenous contrast. Multiplanar CT image reconstructions and MIPs were obtained to evaluate the vascular anatomy. Carotid stenosis measurements (when applicable) are obtained utilizing NASCET criteria, using the distal internal carotid diameter as the denominator. CONTRAST:  34mL OMNIPAQUE IOHEXOL 350 MG/ML SOLN COMPARISON:  CT head 05/08/2011 FINDINGS: CT HEAD FINDINGS Brain: Ventricle size normal. Moderate white matter changes with patchy white matter hypodensity bilaterally, with progression from the prior study. Negative for acute cortical infarct, hemorrhage, mass. Vascular: Negative for hyperdense vessel Skull: Negative Sinuses: Air-fluid level in the maxillary sinus bilaterally. Orbits: Bilateral cataract extraction. Review of the MIP images confirms the above findings CTA NECK FINDINGS Aortic arch: Standard branching. Imaged portion shows no evidence of aneurysm or dissection. No significant stenosis of the major arch vessel origins. Right carotid system: Minimal atherosclerotic disease right carotid bifurcation without stenosis Left carotid system: Left carotid normal and widely patent without stenosis Vertebral arteries: Both vertebral arteries widely patent without stenosis. Skeleton: Negative Other neck: Thyroid goiter with heterogeneous enhancement. 1 cm nodule upper pole bilaterally. No further imaging necessary. Upper chest:  Negative Review of the MIP images confirms the above findings CTA HEAD FINDINGS Anterior circulation: Cavernous carotid normal bilaterally without stenosis. Anterior and middle cerebral arteries normal bilaterally. Posterior circulation: Both vertebral arteries patent to the basilar. Right PICA patent. Left PICA not visualized. Left AICA patent. Basilar widely patent. Superior cerebellar and posterior cerebral arteries normal bilaterally. Venous sinuses: Normal venous enhancement. Right transverse sinus dominant. Anatomic variants: None Review of the MIP images confirms the above findings IMPRESSION: 1. CT head negative for acute infarct. Patchy white matter hypodensity bilaterally most consistent with microvascular ischemia 2. Negative CTA head neck. No significant stenosis. Negative for intracranial large vessel occlusion. Electronically Signed   By: Franchot Gallo M.D.  On: 09/30/2020 19:49   CT VENOGRAM HEAD  Result Date: 09/30/2020 CLINICAL DATA:  Headache.  Rule out dural venous sinus thrombosis. EXAM: CT VENOGRAM HEAD TECHNIQUE: Delayed venous phase scanning was obtained through the head following CT angio head and neck. CONTRAST:  33mL OMNIPAQUE IOHEXOL 350 MG/ML SOLN COMPARISON:  CT angio head neck 09/30/2020 FINDINGS: Superior sagittal sinus widely patent and normal. Internal cerebral veins patent. Straight sinus patent. Dominant right transverse sinus. No transverse sinus thrombosis. Hypoplastic left transverse sinus. IMPRESSION: Negative for dural venous sinus thrombosis. Electronically Signed   By: Marlan Palau M.D.   On: 09/30/2020 19:51    Assessment & Plan:    Sonda Primes, MD

## 2020-10-04 NOTE — Assessment & Plan Note (Signed)
On vitamin D 

## 2020-10-04 NOTE — Assessment & Plan Note (Addendum)
Atacand Resume PRN Clonidine Good blood pressure control/hypertension treatment need for stroke prevention was reinforced

## 2020-10-17 ENCOUNTER — Encounter: Payer: Medicare Other | Admitting: Internal Medicine

## 2020-11-07 ENCOUNTER — Other Ambulatory Visit (HOSPITAL_COMMUNITY): Payer: Self-pay | Admitting: Obstetrics and Gynecology

## 2020-11-07 ENCOUNTER — Other Ambulatory Visit: Payer: Self-pay | Admitting: Obstetrics and Gynecology

## 2020-11-07 DIAGNOSIS — E041 Nontoxic single thyroid nodule: Secondary | ICD-10-CM

## 2020-11-20 ENCOUNTER — Ambulatory Visit (HOSPITAL_COMMUNITY)
Admission: RE | Admit: 2020-11-20 | Discharge: 2020-11-20 | Disposition: A | Discharge status: Home / Self Care | Payer: Medicare Other | Source: Ambulatory Visit | Attending: Obstetrics and Gynecology | Admitting: Obstetrics and Gynecology

## 2020-11-20 ENCOUNTER — Other Ambulatory Visit: Payer: Self-pay

## 2020-11-20 ENCOUNTER — Telehealth: Payer: Self-pay | Admitting: Internal Medicine

## 2020-11-20 DIAGNOSIS — E041 Nontoxic single thyroid nodule: Secondary | ICD-10-CM | POA: Insufficient documentation

## 2020-11-20 NOTE — Telephone Encounter (Signed)
Spoke with patient and told her I rescheduled her covid testing to 11/24/2020 at 1:00.  Patient acknowledged.

## 2020-11-20 NOTE — Telephone Encounter (Signed)
Patient called and said she had rescheduled her procedure and now needs to date and time for the Covid test. Please advise

## 2020-11-23 ENCOUNTER — Other Ambulatory Visit: Payer: Self-pay | Admitting: Obstetrics and Gynecology

## 2020-11-23 ENCOUNTER — Ambulatory Visit (HOSPITAL_COMMUNITY): Payer: Medicare Other

## 2020-11-23 ENCOUNTER — Encounter (HOSPITAL_COMMUNITY): Payer: Self-pay

## 2020-11-23 DIAGNOSIS — E041 Nontoxic single thyroid nodule: Secondary | ICD-10-CM

## 2020-11-24 ENCOUNTER — Other Ambulatory Visit: Payer: Self-pay | Admitting: Internal Medicine

## 2020-11-24 ENCOUNTER — Encounter: Payer: Self-pay | Admitting: Internal Medicine

## 2020-11-24 DIAGNOSIS — Z1159 Encounter for screening for other viral diseases: Secondary | ICD-10-CM | POA: Diagnosis not present

## 2020-11-24 LAB — SARS CORONAVIRUS 2 (TAT 6-24 HRS): SARS Coronavirus 2: NEGATIVE

## 2020-11-27 ENCOUNTER — Telehealth: Payer: Self-pay | Admitting: Internal Medicine

## 2020-11-27 NOTE — Telephone Encounter (Signed)
Pt is requesting a call back from a nurse to discuss an [place] of her Thyroid she just had, pt would like to know if it is still ok to proceed with her egd and colonoscopy scheduled for tomorrow.

## 2020-11-27 NOTE — Telephone Encounter (Signed)
Called the patient back she had questions regarding her SUTAB prep. Clarified instructions with the patient and she verbalized understanding.

## 2020-11-28 ENCOUNTER — Other Ambulatory Visit: Payer: Self-pay

## 2020-11-28 ENCOUNTER — Ambulatory Visit (AMBULATORY_SURGERY_CENTER): Payer: Medicare Other | Admitting: Internal Medicine

## 2020-11-28 ENCOUNTER — Encounter: Payer: Self-pay | Admitting: Internal Medicine

## 2020-11-28 VITALS — BP 183/99 | HR 63 | Temp 97.8°F | Resp 18 | Ht 63.0 in | Wt 163.0 lb

## 2020-11-28 DIAGNOSIS — Z860100 Personal history of colon polyps, unspecified: Secondary | ICD-10-CM

## 2020-11-28 DIAGNOSIS — D124 Benign neoplasm of descending colon: Secondary | ICD-10-CM

## 2020-11-28 DIAGNOSIS — K219 Gastro-esophageal reflux disease without esophagitis: Secondary | ICD-10-CM

## 2020-11-28 DIAGNOSIS — K222 Esophageal obstruction: Secondary | ICD-10-CM | POA: Diagnosis not present

## 2020-11-28 DIAGNOSIS — D122 Benign neoplasm of ascending colon: Secondary | ICD-10-CM

## 2020-11-28 DIAGNOSIS — Z1211 Encounter for screening for malignant neoplasm of colon: Secondary | ICD-10-CM | POA: Diagnosis not present

## 2020-11-28 DIAGNOSIS — K2990 Gastroduodenitis, unspecified, without bleeding: Secondary | ICD-10-CM

## 2020-11-28 DIAGNOSIS — K2950 Unspecified chronic gastritis without bleeding: Secondary | ICD-10-CM | POA: Diagnosis not present

## 2020-11-28 DIAGNOSIS — R1310 Dysphagia, unspecified: Secondary | ICD-10-CM | POA: Diagnosis not present

## 2020-11-28 DIAGNOSIS — K2970 Gastritis, unspecified, without bleeding: Secondary | ICD-10-CM

## 2020-11-28 MED ORDER — SODIUM CHLORIDE 0.9 % IV SOLN: 500.0000 mL | Freq: Once | INTRAVENOUS | Status: DC

## 2020-11-28 NOTE — Progress Notes (Addendum)
Deport reported she took all 12 tabs of Sutab last pr.  Before she drank her water behind the prep, pt vomited 3x.  She did not have any vomiting this am, and reports last BM was clear, yellow liquid. Maw   Courtney Washing, CMA took pt's B/P with monitor and reading left arm 183/115.  She attempted to tae it on right arm and the monitor would not register.  Alden Hipp, RN took b/p manually on left arm 158/82. Maw

## 2020-11-28 NOTE — Op Note (Signed)
[facility] Patient Name: Kelly Santos Procedure Date: 11/28/2020 2:58 PM MRN: [id number] Endoscopist: Docia Chuck. Henrene Pastor , MD Age: 75 Referring MD:  Date of Birth: 10-09-1945 Gender: Female Account #: [id number] Procedure:                Upper GI endoscopy with biopsies; with balloon                            dilation of the esophagus?"20 mm Indications:              Dysphagia, Therapeutic procedure Medicines:                Monitored Anesthesia Care Procedure:                Pre-Anesthesia Assessment:                           - Prior to the procedure, a History and Physical                            was performed, and patient medications and                            allergies were reviewed. The patient's tolerance of                            previous anesthesia was also reviewed. The risks                            and benefits of the procedure and the sedation                            options and risks were discussed with the patient.                            All questions were answered, and informed consent                            was obtained. Prior Anticoagulants: The patient has                            taken no previous anticoagulant or antiplatelet                            agents. ASA Grade Assessment: II - A patient with                            mild systemic disease. After reviewing the risks                            and benefits, the patient was deemed in                            satisfactory condition to undergo the procedure.  After obtaining informed consent, the endoscope was                            passed under direct vision. Throughout the                            procedure, the patient's blood pressure, pulse, and                            oxygen saturations were monitored continuously. The                            Endoscope was introduced through the mouth, and                            advanced to the  second part of duodenum. The upper                            GI endoscopy was accomplished without difficulty.                            The patient tolerated the procedure well. Scope In: Scope Out: Findings:                 One benign-appearing, intrinsic moderate stenosis                            was found 36 cm from the incisors. This stenosis                            measured 1.5 cm (inner diameter). A TTS dilator was                            passed through the scope. Dilation with an 18-19-20                            mm balloon dilator was performed to 20 mm.                           The exam of the esophagus was otherwise normal.                           The stomach revealed a moderate hiatal hernia. No                            erosions today as previously noted. There was                            patchy antral erythema. Biopsies were taken with a                            cold forceps for histology.  The examined duodenum was normal.                           The cardia and gastric fundus were normal on                            retroflexion, save hiatal hernia. Complications:            No immediate complications. Estimated Blood Loss:     Estimated blood loss: none. Impression:               1. Large caliber distal esophageal ring/stricture                            status post dilation                           2. Hiatal hernia                           3. Patchy antral erythema status post biopsies                           4. Otherwise normal EGD. Recommendation:           - Patient has a contact number available for                            emergencies. The signs and symptoms of potential                            delayed complications were discussed with the                            patient. Return to normal activities tomorrow.                            Written discharge instructions were provided to the                             patient.                           - Post dilation diet.                           - Continue present medications.                           - Await pathology results. -Follow-up as needed Docia Chuck. Henrene Pastor, MD 11/28/2020 3:50:12 PM This report has been signed electronically.

## 2020-11-28 NOTE — Progress Notes (Signed)
Called to room to assist during endoscopic procedure.  Patient ID and intended procedure confirmed with present staff. Received instructions for my participation in the procedure from the performing physician.  

## 2020-11-28 NOTE — Progress Notes (Signed)
A and O x3. Report to RN. Tolerated MAC anesthesia well.Teeth unchanged after procedure.

## 2020-11-28 NOTE — Op Note (Signed)
[place] Patient Name: Kelly Santos Procedure Date: 11/28/2020 2:59 PM MRN: [id number] Endoscopist: Docia Chuck. Henrene Pastor , MD Age: 75 Referring MD:  Date of Birth: 29-Aug-1946 Gender: Female Account #: [id number] Procedure:                Colonoscopy with cold snare polypectomy x 2 Indications:              High risk colon cancer surveillance: Personal                            history of non-advanced adenoma. Previous                            examination December 2016 Medicines:                Monitored Anesthesia Care Procedure:                Pre-Anesthesia Assessment:                           - Prior to the procedure, a History and Physical                            was performed, and patient medications and                            allergies were reviewed. The patient's tolerance of                            previous anesthesia was also reviewed. The risks                            and benefits of the procedure and the sedation                            options and risks were discussed with the patient.                            All questions were answered, and informed consent                            was obtained. Prior Anticoagulants: The patient has                            taken no previous anticoagulant or antiplatelet                            agents. ASA Grade Assessment: II - A patient with                            mild systemic disease. After reviewing the risks                            and benefits, the patient was deemed in  satisfactory condition to undergo the procedure.                           After obtaining informed consent, the colonoscope                            was passed under direct vision. Throughout the                            procedure, the patient's blood pressure, pulse, and                            oxygen saturations were monitored continuously. The                            Olympus CF-HQ190  [id number]) Colonoscope was                            introduced through the anus and advanced to the the                            cecum, identified by appendiceal orifice and                            ileocecal valve. The ileocecal valve, appendiceal                            orifice, and rectum were photographed. The quality                            of the bowel preparation was excellent. The                            colonoscopy was performed without difficulty. The                            patient tolerated the procedure well. The bowel                            preparation used was SUPREP via split dose                            instruction. Scope In: 3:15:30 PM Scope Out: 3:32:25 PM Scope Withdrawal Time: 0 hours 11 minutes 48 seconds  Total Procedure Duration: 0 hours 16 minutes 55 seconds  Findings:                 Two polyps were found in the descending colon and                            ascending colon. The polyps were 2 to 3 mm in size.                            These polyps were removed with a cold snare.  Resection and retrieval were complete.                           Multiple diverticula were found in the entire colon.                           Internal hemorrhoids were found during                            retroflexion. The hemorrhoids were moderate.                           The exam was otherwise without abnormality on                            direct and retroflexion views. Complications:            No immediate complications. Estimated blood loss:                            None. Estimated Blood Loss:     Estimated blood loss: none. Impression:               - Two 2 to 3 mm polyps in the descending colon and                            in the ascending colon, removed with a cold snare.                            Resected and retrieved.                           - Diverticulosis in the entire examined colon.                            - Internal hemorrhoids.                           - The examination was otherwise normal on direct                            and retroflexion views. Recommendation:           - Repeat colonoscopy is not recommended for                            surveillance.                           - Patient has a contact number available for                            emergencies. The signs and symptoms of potential                            delayed complications were discussed with the  patient. Return to normal activities tomorrow.                            Written discharge instructions were provided to the                            patient.                           - Resume previous diet.                           - Continue present medications.                           - Await pathology results. Docia Chuck. Henrene Pastor, MD 11/28/2020 3:37:04 PM This report has been signed electronically.

## 2020-11-28 NOTE — Patient Instructions (Signed)
Handout given :  Polyps,Diverticulosis Start post dilation diet Continue current medications Await pathology results  YOU HAD AN ENDOSCOPIC PROCEDURE TODAY AT [facility]:   Refer to the procedure report that was given to you for any specific questions about what was found during the examination.  If the procedure report does not answer your questions, please call your gastroenterologist to clarify.  If you requested that your care partner not be given the details of your procedure findings, then the procedure report has been included in a sealed envelope for you to review at your convenience later.  YOU SHOULD EXPECT: Some feelings of bloating in the abdomen. Passage of more gas than usual.  Walking can help get rid of the air that was put into your GI tract during the procedure and reduce the bloating. If you had a lower endoscopy (such as a colonoscopy or flexible sigmoidoscopy) you may notice spotting of blood in your stool or on the toilet paper. If you underwent a bowel prep for your procedure, you may not have a normal bowel movement for a few days.  Please Note:  You might notice some irritation and congestion in your nose or some drainage.  This is from the oxygen used during your procedure.  There is no need for concern and it should clear up in a day or so.  SYMPTOMS TO REPORT IMMEDIATELY:   Following lower endoscopy (colonoscopy or flexible sigmoidoscopy):  Excessive amounts of blood in the stool  Significant tenderness or worsening of abdominal pains  Swelling of the abdomen that is new, acute  Fever of 100F or higher   Following upper endoscopy (EGD)  Vomiting of blood or coffee ground material  New chest pain or pain under the shoulder blades  Painful or persistently difficult swallowing  New shortness of breath  Fever of 100F or higher  Black, tarry-looking stools  For urgent or emergent issues, a gastroenterologist can be reached at any hour by calling  [phone]. Do not use MyChart messaging for urgent concerns.   DIET:  We do recommend a small meal at first, but then you may proceed to your regular diet.  Drink plenty of fluids but you should avoid alcoholic beverages for 24 hours.  ACTIVITY:  You should plan to take it easy for the rest of today and you should NOT DRIVE or use heavy machinery until tomorrow (because of the sedation medicines used during the test).    FOLLOW UP: Our staff will call the number listed on your records 48-72 hours following your procedure to check on you and address any questions or concerns that you may have regarding the information given to you following your procedure. If we do not reach you, we will leave a message.  We will attempt to reach you two times.  During this call, we will ask if you have developed any symptoms of COVID 19. If you develop any symptoms (ie: fever, flu-like symptoms, shortness of breath, cough etc.) before then, please call [phone].  If you test positive for Covid 19 in the 2 weeks post procedure, please call and report this information to [place].    If any biopsies were taken you will be contacted by phone or by letter within the next 1-3 weeks.  Please call us at [phone] if you have not heard about the biopsies in 3 weeks.   SIGNATURES/CONFIDENTIALITY: You and/or your care partner have signed paperwork which will be entered into your electronic medical  record.  These signatures attest to the fact that that the information above on your After Visit Summary has been reviewed and is understood.  Full responsibility of the confidentiality of this discharge information lies with you and/or your care-partner. 

## 2020-11-30 ENCOUNTER — Telehealth: Payer: Self-pay

## 2020-11-30 NOTE — Telephone Encounter (Signed)
Attempted to reach patient for post-procedure f/u call. No answer. Left message that we will make another attempt to reach her later today and for her to please not hesitate to call us if she has any questions/concerns regarding her care. 

## 2020-12-06 ENCOUNTER — Encounter: Payer: Self-pay | Admitting: Internal Medicine

## 2020-12-07 ENCOUNTER — Other Ambulatory Visit (HOSPITAL_COMMUNITY)
Admission: RE | Admit: 2020-12-07 | Discharge: 2020-12-07 | Disposition: A | Discharge status: Home / Self Care | Payer: Medicare Other | Source: Ambulatory Visit | Attending: Radiology | Admitting: Radiology

## 2020-12-07 ENCOUNTER — Ambulatory Visit
Admission: RE | Admit: 2020-12-07 | Discharge: 2020-12-07 | Disposition: A | Discharge status: Home / Self Care | Payer: Medicare Other | Source: Ambulatory Visit | Attending: Obstetrics and Gynecology | Admitting: Obstetrics and Gynecology

## 2020-12-07 DIAGNOSIS — E041 Nontoxic single thyroid nodule: Secondary | ICD-10-CM

## 2020-12-07 DIAGNOSIS — D440 Neoplasm of uncertain behavior of thyroid gland: Secondary | ICD-10-CM | POA: Diagnosis not present

## 2020-12-08 LAB — CYTOLOGY - NON PAP

## 2020-12-11 DIAGNOSIS — Z1231 Encounter for screening mammogram for malignant neoplasm of breast: Secondary | ICD-10-CM | POA: Diagnosis not present

## 2020-12-13 DIAGNOSIS — H401111 Primary open-angle glaucoma, right eye, mild stage: Secondary | ICD-10-CM | POA: Diagnosis not present

## 2020-12-13 DIAGNOSIS — H401122 Primary open-angle glaucoma, left eye, moderate stage: Secondary | ICD-10-CM | POA: Diagnosis not present

## 2020-12-14 DIAGNOSIS — H401122 Primary open-angle glaucoma, left eye, moderate stage: Secondary | ICD-10-CM | POA: Diagnosis not present

## 2020-12-14 DIAGNOSIS — H401111 Primary open-angle glaucoma, right eye, mild stage: Secondary | ICD-10-CM | POA: Diagnosis not present

## 2021-01-23 ENCOUNTER — Other Ambulatory Visit: Payer: Self-pay

## 2021-01-25 ENCOUNTER — Encounter: Payer: Self-pay | Admitting: Internal Medicine

## 2021-01-25 ENCOUNTER — Other Ambulatory Visit: Payer: Self-pay

## 2021-01-25 ENCOUNTER — Ambulatory Visit (INDEPENDENT_AMBULATORY_CARE_PROVIDER_SITE_OTHER): Payer: Medicare Other | Admitting: Internal Medicine

## 2021-01-25 VITALS — BP 148/92 | HR 67 | Temp 98.2°F | Ht 63.0 in | Wt 164.2 lb

## 2021-01-25 DIAGNOSIS — R0789 Other chest pain: Secondary | ICD-10-CM

## 2021-01-25 DIAGNOSIS — R609 Edema, unspecified: Secondary | ICD-10-CM

## 2021-01-25 DIAGNOSIS — E559 Vitamin D deficiency, unspecified: Secondary | ICD-10-CM

## 2021-01-25 DIAGNOSIS — M5442 Lumbago with sciatica, left side: Secondary | ICD-10-CM | POA: Diagnosis not present

## 2021-01-25 DIAGNOSIS — I5189 Other ill-defined heart diseases: Secondary | ICD-10-CM | POA: Diagnosis not present

## 2021-01-25 DIAGNOSIS — G8929 Other chronic pain: Secondary | ICD-10-CM | POA: Diagnosis not present

## 2021-01-25 DIAGNOSIS — E049 Nontoxic goiter, unspecified: Secondary | ICD-10-CM | POA: Diagnosis not present

## 2021-01-25 DIAGNOSIS — Z0000 Encounter for general adult medical examination without abnormal findings: Secondary | ICD-10-CM | POA: Diagnosis not present

## 2021-01-25 LAB — COMPREHENSIVE METABOLIC PANEL
ALT: 19 U/L (ref 0–35)
AST: 18 U/L (ref 0–37)
Albumin: 3.9 g/dL (ref 3.5–5.2)
Alkaline Phosphatase: 71 U/L (ref 39–117)
BUN: 14 mg/dL (ref 6–23)
CO2: 30 mEq/L (ref 19–32)
Calcium: 9.8 mg/dL (ref 8.4–10.5)
Chloride: 104 mEq/L (ref 96–112)
Creatinine, Ser: 0.79 mg/dL (ref 0.40–1.20)
GFR: 73.57 mL/min (ref >60.00)
Glucose, Bld: 112 mg/dL — ABNORMAL HIGH (ref 70–99)
Potassium: 3.6 mEq/L (ref 3.5–5.1)
Sodium: 139 mEq/L (ref 135–145)
Total Bilirubin: 0.4 mg/dL (ref 0.2–1.2)
Total Protein: 7.3 g/dL (ref 6.0–8.3)

## 2021-01-25 LAB — CBC WITH DIFFERENTIAL/PLATELET
Basophils Absolute: 0 10*3/uL (ref 0.0–0.1)
Basophils Relative: 0.8 % (ref 0.0–3.0)
Eosinophils Absolute: 0.1 10*3/uL (ref 0.0–0.7)
Eosinophils Relative: 3.4 % (ref 0.0–5.0)
HCT: 37.7 % (ref 36.0–46.0)
Hemoglobin: 12.8 g/dL (ref 12.0–15.0)
Lymphocytes Relative: 14.2 % (ref 12.0–46.0)
Lymphs Abs: 0.6 10*3/uL — ABNORMAL LOW (ref 0.7–4.0)
MCHC: 33.9 g/dL (ref 30.0–36.0)
MCV: 99.5 fl (ref 78.0–100.0)
Monocytes Absolute: 0.4 10*3/uL (ref 0.1–1.0)
Monocytes Relative: 8.5 % (ref 3.0–12.0)
Neutro Abs: 3 10*3/uL (ref 1.4–7.7)
Neutrophils Relative %: 73.1 % (ref 43.0–77.0)
Platelets: 180 10*3/uL (ref 150.0–400.0)
RBC: 3.79 Mil/uL — ABNORMAL LOW (ref 3.87–5.11)
RDW: 12.4 % (ref 11.5–15.5)
WBC: 4.1 10*3/uL (ref 4.0–10.5)

## 2021-01-25 LAB — LIPID PANEL
Cholesterol: 152 mg/dL (ref 0–200)
HDL: 43.7 mg/dL (ref >39.00)
LDL Cholesterol: 69 mg/dL (ref 0–99)
NonHDL: 107.81
Total CHOL/HDL Ratio: 3
Triglycerides: 195 mg/dL — ABNORMAL HIGH (ref 0.0–149.0)
VLDL: 39 mg/dL (ref 0.0–40.0)

## 2021-01-25 LAB — URINALYSIS, ROUTINE W REFLEX MICROSCOPIC
Bilirubin Urine: NEGATIVE
Ketones, ur: NEGATIVE
Nitrite: NEGATIVE
Specific Gravity, Urine: <=1.005 — AB (ref 1.000–1.030)
Total Protein, Urine: NEGATIVE
Urine Glucose: NEGATIVE
Urobilinogen, UA: 0.2 (ref 0.0–1.0)
pH: 6.5 (ref 5.0–8.0)

## 2021-01-25 LAB — TSH: TSH: 0.75 u[IU]/mL (ref 0.35–4.50)

## 2021-01-25 MED ORDER — LORATADINE 10 MG PO TABS: 10.0000 mg | ORAL_TABLET | Freq: Every day | ORAL | 11 refills | Status: DC

## 2021-01-25 NOTE — Assessment & Plan Note (Signed)
On Candesartan and Spironolactone

## 2021-01-25 NOTE — Assessment & Plan Note (Signed)
On Vit D 

## 2021-01-25 NOTE — Assessment & Plan Note (Addendum)
(-)   FNA in 3/22 Repeat US in 2-3 years

## 2021-01-25 NOTE — Assessment & Plan Note (Addendum)
Recurrent - likely MSK.Previous tests reviewed and discussed w/pt A cardiac CT scan for calcium score is 0 in 2021

## 2021-01-25 NOTE — Progress Notes (Signed)
Subjective:  Patient ID: Kelly Santos, female    DOB: May 07, 1946  Age: 75 y.o. MRN: [id number]  CC: Follow-up (3 month f/u- Ongoing back pian)   HPI Kelly Santos presents for LBP - worse F/u allergies, HTN, CP  Outpatient Medications Prior to Visit  Medication Sig Dispense Refill  . amitriptyline (ELAVIL) 25 MG tablet Take 1 tablet (25 mg total) by mouth at bedtime as needed for sleep. Due for routine OV 30 tablet 5  . BREO ELLIPTA 100-25 MCG/INH AEPB INHALE 1 PUFF INTO THE LUNGS EVERY DAY 60 each 5  . candesartan (ATACAND) 32 MG tablet Take 1 tablet (32 mg total) by mouth daily. 90 tablet 3  . cholecalciferol (VITAMIN D) 1000 UNITS tablet Take 1,000 Units by mouth every other day.     . cloNIDine (CATAPRES) 0.1 MG tablet Take 1 tablet (0.1 mg total) by mouth 2 (two) times daily as needed. For systolic [id number] 60 tablet 3  . COMBIGAN 0.2-0.5 % ophthalmic solution INSTILL 1 DROP INTO BOTH EYES TWICE A DAY 5 mL 2  . Ferrous Sulfate (IRON) 325 (65 FE) MG TABS Take 1 tablet by mouth 3 (three) times a week.     . hydrocortisone (PROCTOCORT) 1 % CREA Apply 1 application topically 2 (two) times daily as needed (dry skin).     . pantoprazole (PROTONIX) 40 MG tablet Take 1 tablet (40 mg total) by mouth daily. 90 tablet 3  . spironolactone (ALDACTONE) 25 MG tablet Take 1 tablet (25 mg total) by mouth daily. 90 tablet 3  . triamcinolone cream (KENALOG) 0.5 % APPLY TOPICALLY 2 (TWO) TIMES DAILY AS NEEDED. 30 g 0  . ibuprofen (ADVIL) 600 MG tablet Take 600 mg by mouth every 6 (six) hours as needed. (Patient not taking: Reported on 01/25/2021)     No facility-administered medications prior to visit.    ROS: Review of Systems  Constitutional: Positive for fatigue. Negative for activity change, appetite change, chills and unexpected weight change.  HENT: Negative for congestion, mouth sores and sinus pressure.   Eyes: Negative for visual disturbance.  Respiratory: Negative for cough, chest  tightness and shortness of breath.   Cardiovascular: Positive for chest pain.  Gastrointestinal: Negative for abdominal pain and nausea.  Genitourinary: Negative for difficulty urinating, frequency and vaginal pain.  Musculoskeletal: Positive for arthralgias and back pain. Negative for gait problem.  Skin: Negative for pallor and rash.  Neurological: Negative for dizziness, tremors, weakness, numbness and headaches.  Psychiatric/Behavioral: Negative for confusion and sleep disturbance.    Objective:  BP (!) 148/92 (BP Location: Left Arm)   Pulse 67   Temp 98.2 F (36.8 C) (Oral)   Ht 5\' 3"  (1.6 m)   Wt 164 lb 3.2 oz (74.5 kg)   SpO2 98%   BMI 29.09 kg/m   BP Readings from Last 3 Encounters:  01/25/21 (!) 148/92  11/28/20 (!) 183/99  10/04/20 140/82    Wt Readings from Last 3 Encounters:  01/25/21 164 lb 3.2 oz (74.5 kg)  11/28/20 163 lb (73.9 kg)  10/04/20 163 lb (73.9 kg)    Physical Exam Constitutional:      General: She is not in acute distress.    Appearance: Normal appearance. She is well-developed.  HENT:     Head: Normocephalic.     Right Ear: External ear normal.     Left Ear: External ear normal.     Nose: Nose normal.  Eyes:     General:  Right eye: No discharge.        Left eye: No discharge.     Conjunctiva/sclera: Conjunctivae normal.     Pupils: Pupils are equal, round, and reactive to light.  Neck:     Thyroid: No thyromegaly.     Vascular: No JVD.     Trachea: No tracheal deviation.  Cardiovascular:     Rate and Rhythm: Normal rate and regular rhythm.     Heart sounds: Normal heart sounds.  Pulmonary:     Effort: No respiratory distress.     Breath sounds: No stridor. No wheezing.  Abdominal:     General: Bowel sounds are normal. There is no distension.     Palpations: Abdomen is soft. There is no mass.     Tenderness: There is no abdominal tenderness. There is no guarding or rebound.  Musculoskeletal:        General: No tenderness.      Cervical back: Normal range of motion and neck supple.  Lymphadenopathy:     Cervical: No cervical adenopathy.  Skin:    Findings: No erythema or rash.  Neurological:     Cranial Nerves: No cranial nerve deficit.     Motor: No abnormal muscle tone.     Coordination: Coordination normal.     Deep Tendon Reflexes: Reflexes normal.  Psychiatric:        Behavior: Behavior normal.        Thought Content: Thought content normal.        Judgment: Judgment normal.   LS - sensitive w/ROM Mild goiter NT  Lab Results  Component Value Date   WBC 5.9 09/30/2020   HGB 12.9 09/30/2020   HCT 38.0 09/30/2020   PLT 246 09/30/2020   GLUCOSE 108 (H) 09/30/2020   CHOL 174 08/25/2019   TRIG 121.0 08/25/2019   HDL 46.80 08/25/2019   LDLCALC 103 (H) 08/25/2019   ALT 24 06/06/2020   AST 22 06/06/2020   NA 139 09/30/2020   K 3.6 09/30/2020   CL 105 09/30/2020   CREATININE 0.88 09/30/2020   BUN 12 09/30/2020   CO2 27 09/30/2020   TSH 0.65 01/26/2020   INR 0.96 05/09/2011   HGBA1C 5.8 (H) 01/23/2017    [place] FNA BX THYROID 1ST LESION AFIRMA  Result Date: 12/07/2020 INDICATION: Indeterminate thyroid nodule Left inferior thyroid nodule 1.5 cm EXAM: ULTRASOUND GUIDED FINE NEEDLE ASPIRATION OF INDETERMINATE THYROID NODULE COMPARISON:  [place] 11/20/20 MEDICATIONS: 5 cc 1% lidocaine COMPLICATIONS: None immediate. TECHNIQUE: Informed written consent was obtained from the patient after a discussion of the risks, benefits and alternatives to treatment. Questions regarding the procedure were encouraged and answered. A timeout was performed prior to the initiation of the procedure. Pre-procedural ultrasound scanning demonstrated unchanged size and appearance of the indeterminate nodule within the left thyroid The procedure was planned. The neck was prepped in the usual sterile fashion, and a sterile drape was applied covering the operative field. A timeout was performed prior to the initiation of the procedure.  Local anesthesia was provided with 1% lidocaine. Under direct ultrasound guidance, 6 FNA biopsies were performed of the left inferior thyroid nodule with a 25 gauge needle. 2 of these samples were obtained for [facility] Multiple ultrasound images were saved for procedural documentation purposes. The samples were prepared and submitted to pathology. Limited post procedural scanning was negative for hematoma or additional complication. Dressings were placed. The patient tolerated the above procedures procedure well without immediate postprocedural complication. FINDINGS: Nodule reference number based  on prior diagnostic ultrasound: 3 Maximum size: 1.5 cm Location: Left; Inferior ACR TI-RADS risk category: TR4 (4-6 points) Reason for biopsy: meets ACR TI-RADS criteria Ultrasound imaging confirms appropriate placement of the needles within the thyroid nodule. IMPRESSION: Technically successful ultrasound guided fine needle aspiration of left inferior thyroid nodule Read by Lavonia Drafts [facility] Electronically Signed   By: Sandi Mariscal M.D.   On: 12/07/2020 14:58    Assessment & Plan:   There are no diagnoses linked to this encounter.    Follow-up: No follow-ups on file.  Walker Kehr, MD

## 2021-01-25 NOTE — Assessment & Plan Note (Signed)
MSK pain is worse NSAIDs prn Join a class, stretch

## 2021-01-25 NOTE — Assessment & Plan Note (Signed)
No relapse 

## 2021-02-26 ENCOUNTER — Other Ambulatory Visit: Payer: Self-pay | Admitting: Internal Medicine

## 2021-03-04 ENCOUNTER — Other Ambulatory Visit: Payer: Self-pay | Admitting: Internal Medicine

## 2021-03-09 ENCOUNTER — Encounter: Payer: Self-pay | Admitting: Internal Medicine

## 2021-03-09 ENCOUNTER — Ambulatory Visit (INDEPENDENT_AMBULATORY_CARE_PROVIDER_SITE_OTHER): Payer: Medicare Other | Admitting: Internal Medicine

## 2021-03-09 ENCOUNTER — Ambulatory Visit (INDEPENDENT_AMBULATORY_CARE_PROVIDER_SITE_OTHER): Payer: Medicare Other

## 2021-03-09 ENCOUNTER — Other Ambulatory Visit: Payer: Self-pay

## 2021-03-09 VITALS — BP 158/94 | HR 72 | Ht 63.0 in | Wt 163.4 lb

## 2021-03-09 DIAGNOSIS — M25562 Pain in left knee: Secondary | ICD-10-CM | POA: Diagnosis not present

## 2021-03-09 DIAGNOSIS — I10 Essential (primary) hypertension: Secondary | ICD-10-CM

## 2021-03-09 DIAGNOSIS — R739 Hyperglycemia, unspecified: Secondary | ICD-10-CM

## 2021-03-09 DIAGNOSIS — S8392XA Sprain of unspecified site of left knee, initial encounter: Secondary | ICD-10-CM | POA: Diagnosis not present

## 2021-03-09 NOTE — Patient Instructions (Signed)
Ok to try the OTC Voltaren Gel for left knee pain (and any other arthritis as well)  Please continue all other medications as before, and refills have been done if requested.  Please have the pharmacy call with any other refills you may need.  Please keep your appointments with your specialists as you may have planned  You will be contacted regarding the referral for: Sports Medicine  Please go to the XRAY Department in the first floor for the x-ray testing  You will be contacted by phone if any changes need to be made immediately.  Otherwise, you will receive a letter about your results with an explanation, but please check with MyChart first.  Please remember to sign up for MyChart if you have not done so, as this will be important to you in the future with finding out test results, communicating by private email, and scheduling acute appointments online when needed.

## 2021-03-09 NOTE — Progress Notes (Signed)
Patient ID: Kelly Santos, female   DOB: [birth year], 74 y.o.   MRN: [id number]        Chief Complaint: left knee pain       HPI:  Kelly Santos is a 75 y.o. female here with left knee pain; pt states has chronic left knee pain and some instability where it tends at times to want to giveaway medially but now falls; 2 wks ago simply had a twisting of the left knee with turning while walking with immediate mild to mod pain occasionally severe, intermittent, worse to walk, better to sit, but no worsening giveaway or falls.  Pain has persistent when she hoped it would just get better, so now here today.  No fever, other trauma or hx of gout.  Also BP has been somewhat labile in the past, and is taking clonidine 0;1 mg prn sbp > 170/110 and at time states bp at home is controlled, though not really checked lately.  Pt denies chest pain, increased sob or doe, wheezing, orthopnea, PND, increased LE swelling, palpitations, dizziness or syncope.   Pt denies polydipsia, polyuria, or new focal neuro s/s.   Pt denies fever, wt loss, night sweats, loss of appetite, or other constitutional symptoms       Wt Readings from Last 3 Encounters:  03/09/21 163 lb 6.4 oz (74.1 kg)  01/25/21 164 lb 3.2 oz (74.5 kg)  11/28/20 163 lb (73.9 kg)   BP Readings from Last 3 Encounters:  03/09/21 (!) 158/94  01/25/21 (!) 148/92  11/28/20 (!) 183/99         Past Medical History:  Diagnosis Date   Anemia    CAD (coronary artery disease)    no cardiologist, followed by PCP only   Cataract    bil cataracts removed   Diverticulosis    Generalized headaches    GERD (gastroesophageal reflux disease)    Glaucoma    Hemorrhoids    Hiatal hernia    On CT done Jan 31, 2011    Hx of colonic polyps    Hypertension    Hyperthyroidism    Osteoarthritis    Uterine polyp    Vitamin D deficiency    Past Surgical History:  Procedure Laterality Date   ABDOMINAL HYSTERECTOMY N/A 04/26/2013   Procedure: HYSTERECTOMY ABDOMINAL;   Surgeon: Cyril Mourning, MD;  Location: [facility];  Service: Gynecology;  Laterality: N/A;   COLONOSCOPY     DILATION AND CURETTAGE OF UTERUS  01/18/2013   with uterine polypectomy   SALPINGOOPHORECTOMY Bilateral 04/26/2013   Procedure: SALPINGO OOPHORECTOMY;  Surgeon: Cyril Mourning, MD;  Location: [facility];  Service: Gynecology;  Laterality: Bilateral;   UPPER GASTROINTESTINAL ENDOSCOPY      reports that she has never smoked. She has never used smokeless tobacco. She reports that she does not drink alcohol and does not use drugs. family history includes CVA in her brother; Deep vein thrombosis (age of onset: 47) in her son; Diabetes in her brother; Heart failure in her father; Hypertension in her brother, father, mother, and sister. Allergies  Allergen Reactions   Amlodipine     edema   Hctz [Hydrochlorothiazide]     Hypokalemia - resolved off HCTZ   Metoprolol     fatigue   Current Outpatient Medications on File Prior to Visit  Medication Sig Dispense Refill   amitriptyline (ELAVIL) 25 MG tablet Take 1 tablet (25 mg total) by mouth at bedtime as needed for sleep. Due for routine  OV 30 tablet 5   BREO ELLIPTA 100-25 MCG/INH AEPB INHALE 1 PUFF INTO THE LUNGS EVERY DAY 3 each 3   candesartan (ATACAND) 32 MG tablet TAKE 1 TABLET BY MOUTH DAILY. 90 tablet 3   cholecalciferol (VITAMIN D) 1000 UNITS tablet Take 1,000 Units by mouth every other day.      cloNIDine (CATAPRES) 0.1 MG tablet Take 1 tablet (0.1 mg total) by mouth 2 (two) times daily as needed. For systolic WU>132 60 tablet 3   COMBIGAN 0.2-0.5 % ophthalmic solution INSTILL 1 DROP INTO BOTH EYES TWICE A DAY 5 mL 2   Ferrous Sulfate (IRON) 325 (65 FE) MG TABS Take 1 tablet by mouth 3 (three) times a week.      hydrocortisone (PROCTOCORT) 1 % CREA Apply 1 application topically 2 (two) times daily as needed (dry skin).      loratadine (CLARITIN) 10 MG tablet Take 1 tablet (10 mg total) by mouth daily. 30 tablet 11   pantoprazole  (PROTONIX) 40 MG tablet Take 1 tablet (40 mg total) by mouth daily. 90 tablet 3   spironolactone (ALDACTONE) 25 MG tablet Take 1 tablet (25 mg total) by mouth daily. 90 tablet 3   triamcinolone cream (KENALOG) 0.5 % APPLY TOPICALLY 2 (TWO) TIMES DAILY AS NEEDED. 30 g 0   PFIZER-BIONT COVID-19 VAC-TRIS SUSP injection      No current facility-administered medications on file prior to visit.        ROS:  All others reviewed and negative.  Objective        PE:  BP (!) 158/94   Pulse 72   Ht 5\' 3"  (1.6 m)   Wt 163 lb 6.4 oz (74.1 kg)   SpO2 96%   BMI 28.95 kg/m                 Constitutional: Pt appears in NAD               HENT: Head: NCAT.                Right Ear: External ear normal.                 Left Ear: External ear normal.                Eyes: . Pupils are equal, round, and reactive to light. Conjunctivae and EOM are normal               Nose: without d/c or deformity               Neck: Neck supple. Gross normal ROM               Cardiovascular: Normal rate and regular rhythm.                 Pulmonary/Chest: Effort normal and breath sounds without rales or wheezing.                Abd:  Soft, NT, ND, + BS, no organomegaly               Neurological: Pt is alert. At baseline orientation, motor grossly intact               Skin: Skin is warm. No rashes, no other new lesions, LE edema trace LLE with 2+ effusion left knee with mild diffuse tender and reduced ROM               Psychiatric: Pt  behavior is normal without agitation , nervous today  Micro: none  Cardiac tracings I have personally interpreted today:  none  Pertinent Radiological findings (summarize): none   Lab Results  Component Value Date   WBC 4.1 01/25/2021   HGB 12.8 01/25/2021   HCT 37.7 01/25/2021   PLT 180.0 01/25/2021   GLUCOSE 112 (H) 01/25/2021   CHOL 152 01/25/2021   TRIG 195.0 (H) 01/25/2021   HDL 43.70 01/25/2021   LDLCALC 69 01/25/2021   ALT 19 01/25/2021   AST 18 01/25/2021   NA 139  01/25/2021   K 3.6 01/25/2021   CL 104 01/25/2021   CREATININE 0.79 01/25/2021   BUN 14 01/25/2021   CO2 30 01/25/2021   TSH 0.75 01/25/2021   INR 0.96 05/09/2011   HGBA1C 5.8 (H) 01/23/2017   Assessment/Plan:  Maicey T Levins is a 75 y.o. Black or African American [2] female with  has a past medical history of Anemia, CAD (coronary artery disease), Cataract, Diverticulosis, Generalized headaches, GERD (gastroesophageal reflux disease), Glaucoma, Hemorrhoids, Hiatal hernia, colonic polyps, Hypertension, Hyperthyroidism, Osteoarthritis, Uterine polyp, and Vitamin D deficiency.  KNEE PAIN, ACUTE Pt here with left knee torque injury x 2 wk without improvement, now with mod to severe persistent pain and swelling, but without further falls or injury; I suspect meniscal or other cartilage injury, ok for volt gel prn, and refer to Sport medicine for further consideration, and xray plain film today per pt reqeust  Hypertensive disorder Uncontrolled BP Readings from Last 3 Encounters:  03/09/21 (!) 158/94  01/25/21 (!) 148/92  11/28/20 (!) 183/99  possible exacerbated today with pain, but suspect uncontrolled for the most part, pt intolerant several common meds in past, taking clonidine now prn, pt declines add scheduled hydralazine for now,  to f/u any worsening symptoms or concerns  Hyperglycemia Lab Results  Component Value Date   HGBA1C 5.8 (H) 01/23/2017   Stable, pt to continue current medical treatment  - diet and wt control to assist with knee and BP control, , f/u with pcp  Followup: Return if symptoms worsen or fail to improve.  Cathlean Cower, MD 03/10/2021 6:59 PM [facility]

## 2021-03-10 ENCOUNTER — Encounter: Payer: Self-pay | Admitting: Internal Medicine

## 2021-03-10 NOTE — Assessment & Plan Note (Addendum)
Pt here with left knee torque injury x 2 wk without improvement, now with mod to severe persistent pain and swelling, but without further falls or injury; I suspect meniscal or other cartilage injury, ok for volt gel prn, and refer to Sport medicine for further consideration, and xray plain film today per pt reqeust

## 2021-03-10 NOTE — Assessment & Plan Note (Signed)
Uncontrolled BP Readings from Last 3 Encounters:  03/09/21 (!) 158/94  01/25/21 (!) 148/92  11/28/20 (!) 183/99  possible exacerbated today with pain, but suspect uncontrolled for the most part, pt intolerant several common meds in past, taking clonidine now prn, pt declines add scheduled hydralazine for now,  to f/u any worsening symptoms or concerns

## 2021-03-10 NOTE — Assessment & Plan Note (Signed)
Lab Results  Component Value Date   HGBA1C 5.8 (H) 01/23/2017   Stable, pt to continue current medical treatment  - diet and wt control to assist with knee and BP control, , f/u with pcp

## 2021-03-13 ENCOUNTER — Encounter: Payer: Self-pay | Admitting: Internal Medicine

## 2021-03-15 ENCOUNTER — Ambulatory Visit: Payer: Self-pay

## 2021-03-15 ENCOUNTER — Encounter: Payer: Self-pay | Admitting: Family Medicine

## 2021-03-15 ENCOUNTER — Ambulatory Visit: Payer: Medicare Other | Admitting: Family Medicine

## 2021-03-15 ENCOUNTER — Other Ambulatory Visit: Payer: Self-pay

## 2021-03-15 VITALS — BP 124/84 | HR 69 | Ht 63.0 in | Wt 163.4 lb

## 2021-03-15 DIAGNOSIS — M25562 Pain in left knee: Secondary | ICD-10-CM | POA: Diagnosis not present

## 2021-03-15 NOTE — Progress Notes (Signed)
I, Kelly Santos, LAT, ATC, am serving as scribe for Dr. Lynne Santos.  Subjective:    I'm seeing this patient as a consultation for: Dr. Cathlean Cower. Note will be routed back to referring provider/PCP.  CC: Left knee pain  HPI: Pt is a 75 y/o female c/o L knee pain. MOI: About 3 weeks ago pt suffered a twisting mechanism while turning when walking and experienced pain in her L knee. Pt locates pain to her L medial knee.  L knee swelling: yes Mechanical symptoms: yes and does note some intermittent instability Aggravates: walking;  Treatments tried: Voltaren gel; Tylenol x 1  Dx imaging: 03/09/21 L knee XR  Past medical history, Surgical history, Family history, Social history, Allergies, and medications have been entered into the medical record, reviewed.   Review of Systems: No new headache, visual changes, nausea, vomiting, diarrhea, constipation, dizziness, abdominal pain, skin rash, fevers, chills, night sweats, weight loss, swollen lymph nodes, body aches, joint swelling, muscle aches, chest pain, shortness of breath, mood changes, visual or auditory hallucinations.   Objective:    Vitals:   03/15/21 1343  BP: 124/84  Pulse: 69  SpO2: 96%   General: Well Developed, well nourished, and in no acute distress.  Neuro/Psych: Alert and oriented x3, extra-ocular muscles intact, able to move all 4 extremities, sensation grossly intact. Skin: Warm and dry, no rashes noted.  Respiratory: Not using accessory muscles, speaking in full sentences, trachea midline.  Cardiovascular: Pulses palpable, no extremity edema. Abdomen: Does not appear distended. MSK: Left knee mild effusion normal motion with crepitation.  Tender palpation medial joint line.  Positive medial McMurray's test.  Intact strength. Mild laxity MCL stress test with pain.   Lab and Radiology Results  Diagnostic Limited MSK Ultrasound of: Left knee Quad tendon intact with hyperechoic change in distal tendon insertion  consistent with calcific tendinopathy. Small joint effusion superior patellar space. Patellar tendon intact. Medial joint line degenerative narrowed joint line with extruded medial meniscus with partial tear with hypoechoic fluid tracking superficial to the medial meniscus consistent with parameniscal cyst. Intact MCL. Lateral joint line degenerative appearing lateral meniscus. Posterior medial Baker's cyst. Impression: Degenerative medial joint line with MCL tear and extruded medial meniscus.  EXAM: LEFT KNEE - COMPLETE 4+ VIEW   COMPARISON:  None   FINDINGS: Osseous demineralization.   Mild medial compartment joint space narrowing.   No fracture, dislocation, or bone destruction.   Small cluster of calcifications are seen posterior to the knee joint on lateral view, question within a small popliteal cyst.   No joint effusion.   IMPRESSION: No acute osseous abnormalities.   Osseous demineralization with minimal degenerative changes at medial compartment.   Question small calcified bodies within a LEFT popliteal cyst.     Electronically Signed   By: Lavonia Dana M.D.   On: 03/12/2021 09:38 I, Kelly Santos, personally (independently) visualized and performed the interpretation of the images attached in this note.    Impression and Recommendations:    Assessment and Plan: 75 y.o. female with left knee pain.  Acute worsening knee pain thought to be due to medial meniscus tear and exacerbation of DJD.  Discussed options.  Patient would like to avoid steroid injection today which I think is reasonable.  Plan to use compression knee sleeve and Voltaren gel recheck in 4 weeks.  If not better would consider steroid injection at that point.Marland Kitchen  PDMP not reviewed this encounter. Orders Placed This Encounter  Procedures   [place]  LIMITED JOINT SPACE STRUCTURES LOW LEFT(NO LINKED CHARGES)    Order Specific Question:   Reason for Exam (SYMPTOM  OR DIAGNOSIS REQUIRED)    Answer:   L knee  pain    Order Specific Question:   Preferred imaging location?    Answer:   [place]   No orders of the defined types were placed in this encounter.   Discussed warning signs or symptoms. Please see discharge instructions. Patient expresses understanding.   The above documentation has been reviewed and is accurate and complete Kelly Santos, M.D.

## 2021-03-15 NOTE — Patient Instructions (Addendum)

## 2021-03-18 ENCOUNTER — Other Ambulatory Visit: Payer: Self-pay | Admitting: Internal Medicine

## 2021-04-11 NOTE — Progress Notes (Signed)
   I, Wendy Poet, LAT, ATC, am serving as scribe for Dr. [name].  Kelly Santos is a 75 y.o. female who presents to [place] at [facility] today for f/u L knee pain. Pt was last seen by Dr. [name] on 03/15/21 was advised to use a compressive knee sleeve and Voltaren gel. Today, pt reports that she is not having pain in her L knee but con't to note mechanical symptoms.  She is using the Voltaren intermittently and purchased a knee sleeve that she uses w/ walking.  Dx imaging: 03/09/21 L knee XR  Pertinent review of systems: No fevers or chills  Relevant historical information: Hypertension   Exam:  BP 120/82 (BP Location: Left Arm, Patient Position: Sitting, Cuff Size: Normal)   Pulse 67   Ht 5\' 3"  (1.6 m)   Wt 161 lb 12.8 oz (73.4 kg)   SpO2 96%   BMI 28.66 kg/m  General: Well Developed, well nourished, and in no acute distress.   MSK: Left knee normal-appearing normal motion with crepitation.  Nontender.  Stable ligamentous exam.  Negative McMurray's test.    Lab and Radiology Results   EXAM: LEFT KNEE - COMPLETE 4+ VIEW   COMPARISON:  None   FINDINGS: Osseous demineralization.   Mild medial compartment joint space narrowing.   No fracture, dislocation, or bone destruction.   Small cluster of calcifications are seen posterior to the knee joint on lateral view, question within a small popliteal cyst.   No joint effusion.   IMPRESSION: No acute osseous abnormalities.   Osseous demineralization with minimal degenerative changes at medial compartment.   Question small calcified bodies within a LEFT popliteal cyst.     Electronically Signed   By: Lavonia Dana M.D.   On: 03/12/2021 09:38   I, [name], personally (independently) visualized and performed the interpretation of the images attached in this note.      Assessment and Plan: 75 y.o. female with left knee pain due primarily to exacerbation of DJD versus possible  degenerative meniscus tear.  Patient is doing well with conservative management including quad strengthening exercises Voltaren gel and compression knee sleeve.  Plan for continued conservative management strategies and recheck as needed.  Next step if needed would be steroid injection although she is doing too well at this point for that.  Recheck as needed.   Total encounter time 20 minutes including face-to-face time with the patient and, reviewing past medical record, and charting on the date of service.   Treatment plan and options Discussed warning signs or symptoms. Please see discharge instructions. Patient expresses understanding.   The above documentation has been reviewed and is accurate and complete [name], M.D.

## 2021-04-12 ENCOUNTER — Encounter: Payer: Self-pay | Admitting: Family Medicine

## 2021-04-12 ENCOUNTER — Other Ambulatory Visit: Payer: Self-pay

## 2021-04-12 ENCOUNTER — Ambulatory Visit: Payer: Medicare Other | Admitting: Family Medicine

## 2021-04-12 VITALS — BP 120/82 | HR 67 | Ht 63.0 in | Wt 161.8 lb

## 2021-04-12 DIAGNOSIS — M25562 Pain in left knee: Secondary | ICD-10-CM

## 2021-04-12 NOTE — Patient Instructions (Signed)
Thank you for coming in today.   Advance your activity as tolerated.   Continue the brace and Voltaren gel as needed.   Return as needed.   I cam do more if we need to.

## 2021-05-14 ENCOUNTER — Telehealth: Payer: Self-pay

## 2021-05-14 NOTE — Telephone Encounter (Signed)
pt has stated she wants to know if she still needs to take the BREO ELLIPTA 100-25 MCG/INH AEPB as it cost to much fir the brand name and if there is something else can take? She also states if this isn't something she needs to take any longer please let her know as she does not want to be wasting money.

## 2021-05-15 NOTE — Telephone Encounter (Signed)
It was given for asthmatic cough a few years ago Thx

## 2021-05-15 NOTE — Telephone Encounter (Signed)
Kelly Santos can check if there is any preferred inhaler like Breo with her insurance that will cost her less.let me know.  Thanks

## 2021-05-15 NOTE — Telephone Encounter (Signed)
Called pt she is wanting to now reason why Breo was rx.Marland KitchenJohny Chess

## 2021-05-16 NOTE — Addendum Note (Signed)
Addended by: Cassandria Anger on: 05/16/2021 11:29 PM   Modules accepted: Orders

## 2021-05-16 NOTE — Telephone Encounter (Signed)
Kelly Santos can stop Breo and see how she does without it.  Her lungs were checked in the past. Thanks

## 2021-05-16 NOTE — Telephone Encounter (Addendum)
Notified pt w/ MD response. Pt states she does not have a cough now, and it was rx a long tine ago. She still don't understand why she need to take if she doesn't have a cough. Want to know does she neef to have her lungs check. She does not want to continue taking if she does not need to. She states the medication is too expensive and she can't afford it.Marland KitchenJohny Chess

## 2021-05-17 NOTE — Telephone Encounter (Signed)
Notified pt w/MD response.../lmb 

## 2021-05-24 ENCOUNTER — Telehealth: Payer: Self-pay | Admitting: Internal Medicine

## 2021-05-24 MED ORDER — CLONIDINE HCL 0.1 MG PO TABS: 0.1000 mg | ORAL_TABLET | Freq: Two times a day (BID) | ORAL | 3 refills | Status: DC | PRN

## 2021-05-24 NOTE — Telephone Encounter (Signed)
Reviewed chart pt is up-to-date sent refills to [facility]../LMB  

## 2021-05-24 NOTE — Telephone Encounter (Signed)
1.Medication Requested: cloNIDine (CATAPRES) 0.1 MG tablet  2. Pharmacy (Name, [place], [place]): [id number] [place], [place] Phone:  [phone]:  [phone]  3. On Med List: yes  4. Last Visit with PCP: 06.10.22  5. Next visit date with PCP: n/a   Agent: Please be advised that RX refills may take up to 3 business days. We ask that you follow-up with your pharmacy.

## 2021-06-01 ENCOUNTER — Telehealth: Payer: Self-pay

## 2021-06-03 ENCOUNTER — Telehealth: Payer: Self-pay

## 2021-06-05 MED ORDER — CANDESARTAN CILEXETIL 32 MG PO TABS: 32.0000 mg | ORAL_TABLET | Freq: Every day | ORAL | 3 refills | Status: DC

## 2021-06-05 MED ORDER — SPIRONOLACTONE 25 MG PO TABS: 25.0000 mg | ORAL_TABLET | Freq: Every day | ORAL | 3 refills | Status: DC

## 2021-06-05 NOTE — Telephone Encounter (Signed)
Duplicate.. see previous phone msg../lmb

## 2021-06-05 NOTE — Telephone Encounter (Signed)
Reviewed chart pt is up-to-date sent refills to [facility].Marland KitchenJohny Chess

## 2021-06-13 ENCOUNTER — Telehealth: Payer: Self-pay | Admitting: Lab

## 2021-06-13 NOTE — Progress Notes (Signed)
Subjective:    Patient ID: Kelly Santos, female    DOB: 05/11/1946, 75 y.o.   MRN: [id number]  This visit occurred during the SARS-CoV-2 public health emergency.  Safety protocols were in place, including screening questions prior to the visit, additional usage of staff PPE, and extensive cleaning of exam room while observing appropriate contact time as indicated for disinfecting solutions.    HPI The patient is here for an acute visit.   Swelling in legs, ankles and concern about fluid build up --she has chronic mild swelling in her legs and the left leg is always worse than the right.  The increased in swelling started about one week ago -  started just after her [birth year] on [birth year]- more swelling than normal.  Since making the appt the swelling has improved and she thinks her legs are pretty much back to normal.  She still has some residual pain in the back of her leg.  Has L knee OA    Echo 2017 - EF 60%, Grade 1 DD   Medications and allergies reviewed with patient and updated if appropriate.  Patient Active Problem List   Diagnosis Date Noted   Goiter 01/25/2021   Hiatal hernia 06/09/2020   Dysphagia 06/09/2020   Chronic venous insufficiency 03/15/2020   New daily persistent headache 08/25/2019   Paresthesia 08/25/2019   Foot pain, right 03/24/2019   Epigastric pain 07/08/2018   Oral ulcer 05/22/2018   Neck pain 05/08/2018   Pleural effusion 05/08/2018   RLQ abdominal pain 11/14/2017   Nausea 11/14/2017   Edema 02/28/2017   Acute upper respiratory infection 05/07/2016   Cough 05/07/2016   Eczema of face 01/22/2016   Fatigue 11/02/2015   Hyperglycemia 03/24/2015   Well adult exam 11/27/2012   Colon polyp, hyperplastic 11/27/2012   Low back pain 08/03/2012   Foot pain, left 04/14/2012   Callus of foot 04/14/2012   Leg pain, left [id number]   Diastolic dysfunction 0000000   Hypokalemia 05/15/2011   Thyrotoxicosis 03/26/2010   RECTAL BLEEDING 03/26/2010    HEMORRHOIDS 03/23/2010   Anemia due to chronic blood loss 03/23/2010   HERPES ZOSTER 11/09/2009   Febrile illness, acute 11/03/2009   Pain in limb 10/04/2009   Headache 10/04/2009   TINEA PEDIS 06/20/2009   ALLERGIC RHINITIS 12/15/2008   Chest pain, atypical 11/08/2008   CERVICAL RADICULOPATHY, RIGHT 03/25/2008   KNEE PAIN, ACUTE 01/18/2008   Vitamin D deficiency 11/21/2007   ABNORMAL CHEST XRAY 11/21/2007   Hypertensive disorder 11/18/2007   GERD 11/18/2007   DIVERTICULOSIS, COLON 11/18/2007   OSTEOARTHRITIS 11/18/2007    Current Outpatient Medications on File Prior to Visit  Medication Sig Dispense Refill   amitriptyline (ELAVIL) 25 MG tablet Take 1 tablet (25 mg total) by mouth at bedtime as needed for sleep. Due for routine OV 30 tablet 5   candesartan (ATACAND) 32 MG tablet Take 1 tablet (32 mg total) by mouth daily. 90 tablet 3   cholecalciferol (VITAMIN D) 1000 UNITS tablet Take 1,000 Units by mouth every other day.      cloNIDine (CATAPRES) 0.1 MG tablet Take 1 tablet (0.1 mg total) by mouth 2 (two) times daily as needed. For systolic [id number] 60 tablet 3   COMBIGAN 0.2-0.5 % ophthalmic solution INSTILL 1 DROP INTO BOTH EYES TWICE A DAY 5 mL 2   Ferrous Sulfate (IRON) 325 (65 FE) MG TABS Take 1 tablet by mouth 3 (three) times a week.      hydrocortisone (PROCTOCORT)  1 % CREA Apply 1 application topically 2 (two) times daily as needed (dry skin).      loratadine (CLARITIN) 10 MG tablet Take 1 tablet (10 mg total) by mouth daily. 30 tablet 11   pantoprazole (PROTONIX) 40 MG tablet TAKE 1 TABLET BY MOUTH EVERY DAY 90 tablet 3   spironolactone (ALDACTONE) 25 MG tablet Take 1 tablet (25 mg total) by mouth daily. 90 tablet 3   triamcinolone cream (KENALOG) 0.5 % APPLY TOPICALLY 2 (TWO) TIMES DAILY AS NEEDED. 30 g 0   No current facility-administered medications on file prior to visit.    Past Medical History:  Diagnosis Date   Anemia    CAD (coronary artery disease)    no  cardiologist, followed by PCP only   Cataract    bil cataracts removed   Diverticulosis    Generalized headaches    GERD (gastroesophageal reflux disease)    Glaucoma    Hemorrhoids    Hiatal hernia    On CT done Jan 31, 2011    Hx of colonic polyps    Hypertension    Hyperthyroidism    Osteoarthritis    Uterine polyp    Vitamin D deficiency     Past Surgical History:  Procedure Laterality Date   ABDOMINAL HYSTERECTOMY N/A 04/26/2013   Procedure: HYSTERECTOMY ABDOMINAL;  Surgeon: Cyril Mourning, MD;  Location: [facility];  Service: Gynecology;  Laterality: N/A;   COLONOSCOPY     DILATION AND CURETTAGE OF UTERUS  01/18/2013   with uterine polypectomy   SALPINGOOPHORECTOMY Bilateral 04/26/2013   Procedure: SALPINGO OOPHORECTOMY;  Surgeon: Cyril Mourning, MD;  Location: [facility];  Service: Gynecology;  Laterality: Bilateral;   UPPER GASTROINTESTINAL ENDOSCOPY      Social History   Socioeconomic History   Marital status: Divorced    Spouse name: n/a   Number of children: 3   Years of education: Not on file   Highest education level: Not on file  Occupational History   Occupation: CNA    Employer: [employer]    Comment: [employer]  Tobacco Use   Smoking status: Never   Smokeless tobacco: Never  Vaping Use   Vaping Use: Never used  Substance and Sexual Activity   Alcohol use: No    Alcohol/week: 0.0 standard drinks   Drug use: No   Sexual activity: Yes  Other Topics Concern   Not on file  Social History Narrative   Lives with her daughter and granddaughter.   Social Determinants of Health   Financial Resource Strain: Not on file  Food Insecurity: Not on file  Transportation Needs: Not on file  Physical Activity: Not on file  Stress: Not on file  Social Connections: Not on file    Family History  Problem Relation Age of Onset   Hypertension Mother    Hypertension Father    Heart failure Father    Deep vein thrombosis Son 22       x 2,  chronic coumadin   Hypertension Sister    CVA Brother    Diabetes Brother    Hypertension Brother    Colon cancer Neg Hx    Esophageal cancer Neg Hx    Stomach cancer Neg Hx    Rectal cancer Neg Hx     Review of Systems  Constitutional:  Negative for fever.  HENT:  Positive for postnasal drip.   Respiratory:  Positive for cough (clearing of mucus from throat). Negative for shortness of  breath and wheezing.   Cardiovascular:  Positive for leg swelling (LLE > RLE). Negative for chest pain and palpitations.  Neurological:  Negative for light-headedness and headaches.      Objective:   Vitals:   06/14/21 0932  BP: (!) 142/88  Pulse: 68  Temp: 98.5 F (36.9 C)  SpO2: 98%   BP Readings from Last 3 Encounters:  06/14/21 (!) 142/88  04/12/21 120/82  03/15/21 124/84   Wt Readings from Last 3 Encounters:  06/14/21 165 lb (74.8 kg)  04/12/21 161 lb 12.8 oz (73.4 kg)  03/15/21 163 lb 6.4 oz (74.1 kg)   Body mass index is 29.23 kg/m.   Physical Exam    Constitutional: Appears well-developed and well-nourished. No distress.  Head: Normocephalic and atraumatic.  Neck: Neck supple. No tracheal deviation present. No thyromegaly present.  No cervical lymphadenopathy Cardiovascular: Normal rate, regular rhythm and normal heart sounds.  No murmur heard.  No right lower extremity edema trace left lower extremity edema.  Significant left lower extremity varicose veins proximal posterior calf Pulmonary/Chest: Effort normal and breath sounds normal. No respiratory distress. No has no wheezes. No rales. Musculoskeletal: Mild effusion left knee, mild tenderness posterior knee, proximal lower leg Skin: Skin is warm and dry. Not diaphoretic.    VAS [place] LOWER EXTREMITY VENOUS (DVT)  Lower Venous Reflux Study  Patient Name:  TAHIRAH GOLDRICK Bennison  Date of Exam:   06/14/2021 Medical Rec #: [id number]      Accession #:    [id number] Date of Birth: [birth year]       Patient Gender: F Patient Age:   79  years Exam Location:  [facility] Procedure:      VAS [place] LOWER EXTREMITY VENOUS REFLUX Referring Phys: Marzetta Board Sherlynn Tourville  --------------------------------------------------------------------------------   Indications: Left lower extremity pain and swelling. Study ordered as a rule out DVT exam which was performed in conjunction with a left leg reflux exam.   Risk Factors: Significant varicosities of the left lower extremity. Performing Technologist: Ronal Fear RVS, RCS    Examination Guidelines: A complete evaluation includes B-mode imaging, spectral Doppler, color Doppler, and power Doppler as needed of all accessible portions of each vessel. Bilateral testing is considered an integral part of a complete examination. Limited examinations for reoccurring indications may be performed as noted. The reflux portion of the exam is performed with the patient in reverse Trendelenburg. Significant venous reflux is defined as >500 ms in the superficial venous system, and >1 second in the deep venous system.    +--------------+---------+------+-----------+------------+--------+ LEFT          Reflux NoRefluxReflux TimeDiameter cmsComments                         Yes                                  +--------------+---------+------+-----------+------------+--------+ CFV                     yes   >1 second                      +--------------+---------+------+-----------+------------+--------+ FV mid                  yes   >1 second                      +--------------+---------+------+-----------+------------+--------+  Popliteal     no                                             +--------------+---------+------+-----------+------------+--------+ GSV at [facility]              yes    >500 ms      1.25             +--------------+---------+------+-----------+------------+--------+ GSV prox thigh          yes    >500 ms      0.69              +--------------+---------+------+-----------+------------+--------+ GSV mid thigh           yes    >500 ms      0.72             +--------------+---------+------+-----------+------------+--------+ GSV dist thigh          yes    >500 ms      0.68             +--------------+---------+------+-----------+------------+--------+ GSV at knee             yes    >500 ms      0.65             +--------------+---------+------+-----------+------------+--------+ GSV prox calf           yes    >500 ms      0.70             +--------------+---------+------+-----------+------------+--------+ SSV Pop Fossa no                            0.19             +--------------+---------+------+-----------+------------+--------+     Findings attempted to be reported to Dr. Billey Gosling office at 11:25 am. No answer. Results will be routed in epic.    Summary: Left: - No evidence of deep vein thrombosis from the common femoral through the popliteal veins. - No evidence of superficial venous thrombosis. - The deep venous system is not competent. - The great saphenous vein is not competent. - The small saphenous vein is competent.   *See table(s) above for measurements and observations.       Preliminary       Assessment & Plan:    See Problem List for Assessment and Plan of chronic medical problems.

## 2021-06-13 NOTE — Chronic Care Management (AMB) (Signed)
  Chronic Care Management   Note  06/13/2021 Name: Kelly Santos MRN: [id number] DOB: [birth year]  Kelly Santos is a 75 y.o. year old female who is a primary care patient of Plotnikov, Evie Lacks, MD. I reached out to [place] by phone today in response to a referral sent by [name]'s PCP, Plotnikov, Evie Lacks, MD.   [name] was given information about Chronic Care Management services today including:  CCM service includes personalized support from designated clinical staff supervised by her physician, including individualized plan of care and coordination with other care providers 24/7 contact phone numbers for assistance for urgent and routine care needs. Service will only be billed when office clinical staff spend 20 minutes or more in a month to coordinate care. Only one practitioner may furnish and bill the service in a calendar month. The patient may stop CCM services at any time (effective at the end of the month) by phone call to the office staff.   Patient agreed to services and verbal consent obtained.   Follow up plan:   [place]

## 2021-06-13 NOTE — Patient Instructions (Addendum)
  An [place] of your left leg was ordered to rule out a blood clot   Continue a low salt diet, elevate your legs when sitting, wear your compression socks.

## 2021-06-14 ENCOUNTER — Encounter: Payer: Self-pay | Admitting: Internal Medicine

## 2021-06-14 ENCOUNTER — Ambulatory Visit (HOSPITAL_COMMUNITY)
Admission: RE | Admit: 2021-06-14 | Discharge: 2021-06-14 | Disposition: A | Discharge status: Home / Self Care | Payer: Medicare Other | Source: Ambulatory Visit | Attending: Internal Medicine | Admitting: Internal Medicine

## 2021-06-14 ENCOUNTER — Other Ambulatory Visit: Payer: Self-pay

## 2021-06-14 ENCOUNTER — Ambulatory Visit (INDEPENDENT_AMBULATORY_CARE_PROVIDER_SITE_OTHER): Payer: Medicare Other | Admitting: Internal Medicine

## 2021-06-14 VITALS — BP 142/88 | HR 68 | Temp 98.5°F | Ht 63.0 in | Wt 165.0 lb

## 2021-06-14 DIAGNOSIS — M79605 Pain in left leg: Secondary | ICD-10-CM

## 2021-06-14 DIAGNOSIS — M25562 Pain in left knee: Secondary | ICD-10-CM | POA: Insufficient documentation

## 2021-06-14 DIAGNOSIS — R600 Localized edema: Secondary | ICD-10-CM | POA: Diagnosis not present

## 2021-06-14 NOTE — Assessment & Plan Note (Addendum)
Acute Increased swelling bilateral legs 1 week ago that has improved since then Left leg> right leg, which is typical for her Increase swelling likely related to dietary indiscretion given that it occurred just after her birthday No symptoms or signs on exam to suggest heart failure, reviewed last echo Left lower leg seems to be worse secondary to significant varicose veins in the left lower leg and left knee arthritis Swelling is back to normal She is having some pain in the posterior left knee and proximal lower leg-there is a slight concern for a blood clot so we will go ahead and get an ultrasound today to rule that out Advised compression socks, especially for the left lower extremity, elevate legs when sitting

## 2021-06-14 NOTE — Assessment & Plan Note (Signed)
Acute Left posterior knee and proximal lower leg Also related to recent swelling that was worse in her left lower leg more than right lower leg Pain is likely from her knee osteoarthritis, but given swelling will get ultrasound of left lower extremity to rule out a DVT, which did come back negative

## 2021-06-19 DIAGNOSIS — H16223 Keratoconjunctivitis sicca, not specified as Sjogren's, bilateral: Secondary | ICD-10-CM | POA: Diagnosis not present

## 2021-06-19 DIAGNOSIS — H26492 Other secondary cataract, left eye: Secondary | ICD-10-CM | POA: Diagnosis not present

## 2021-06-19 DIAGNOSIS — H401122 Primary open-angle glaucoma, left eye, moderate stage: Secondary | ICD-10-CM | POA: Diagnosis not present

## 2021-06-19 DIAGNOSIS — H401111 Primary open-angle glaucoma, right eye, mild stage: Secondary | ICD-10-CM | POA: Diagnosis not present

## 2021-06-25 DIAGNOSIS — Z961 Presence of intraocular lens: Secondary | ICD-10-CM | POA: Diagnosis not present

## 2021-06-25 DIAGNOSIS — I10 Essential (primary) hypertension: Secondary | ICD-10-CM | POA: Diagnosis not present

## 2021-06-25 DIAGNOSIS — H26492 Other secondary cataract, left eye: Secondary | ICD-10-CM | POA: Diagnosis not present

## 2021-06-25 DIAGNOSIS — H401132 Primary open-angle glaucoma, bilateral, moderate stage: Secondary | ICD-10-CM | POA: Diagnosis not present

## 2021-06-25 DIAGNOSIS — H26493 Other secondary cataract, bilateral: Secondary | ICD-10-CM | POA: Diagnosis not present

## 2021-07-02 IMAGING — US US THYROID
1 series · 13 of 25 positions shown · non-contrast
Comparison: None.

CLINICAL DATA: Thyroid nodule

EXAM:
THYROID ULTRASOUND
TECHNIQUE: Ultrasound examination of the thyroid gland and adjacent soft
tissues was performed.

[Series 1: us thyroid · 87 acquisitions, 13 frames shown]
[im 1/87]
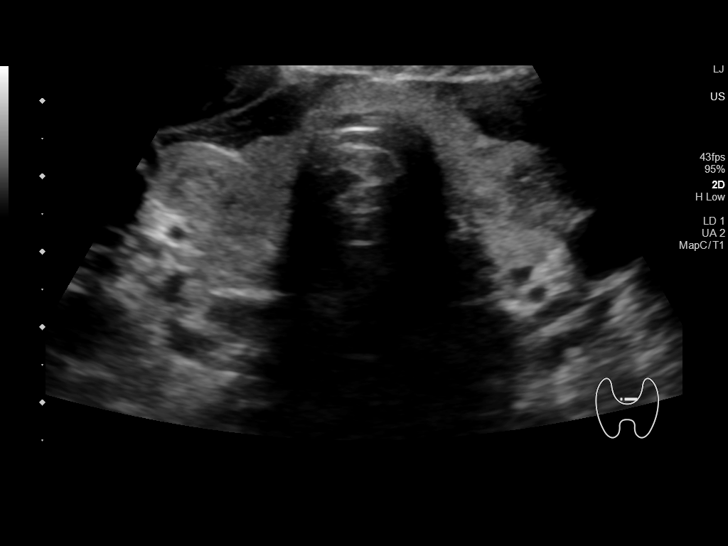
[im 8/87]
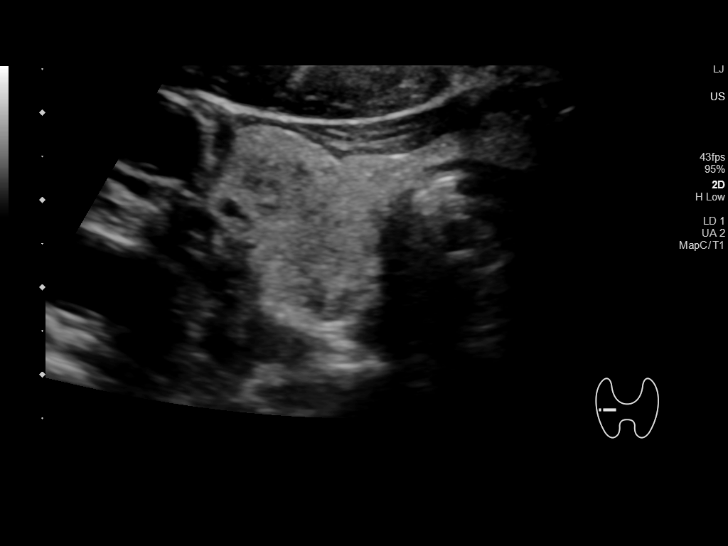
[im 15/87]
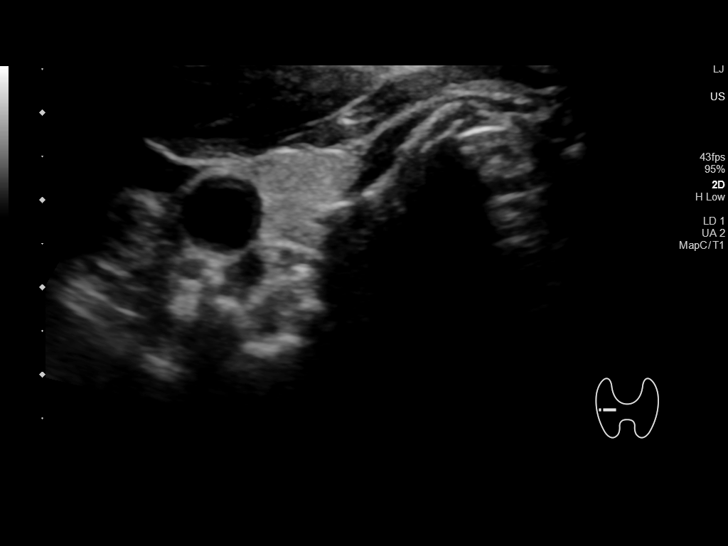
[im 22/87]
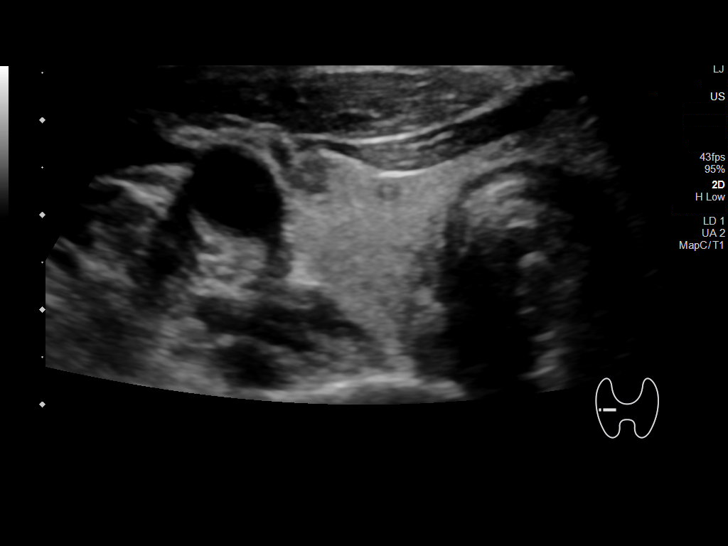
[im 29/87]
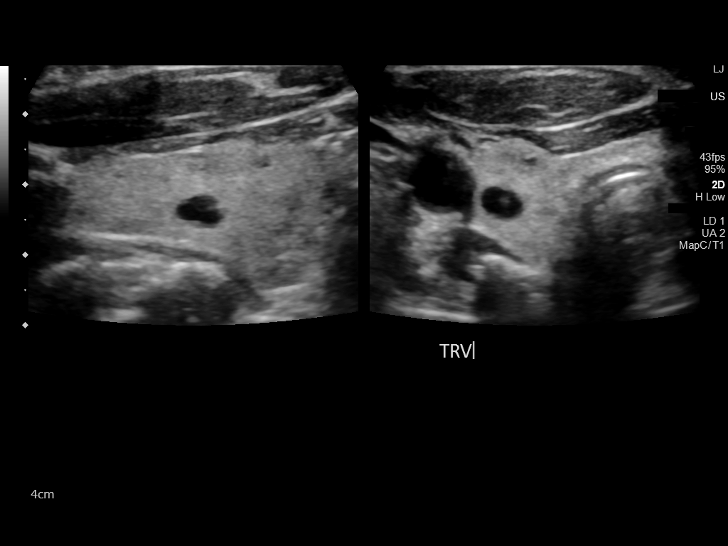
[im 36/87]
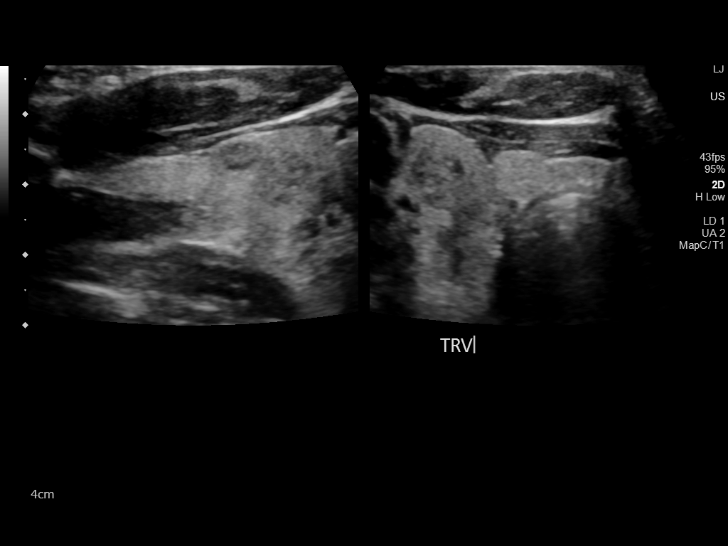
[im 44/87]
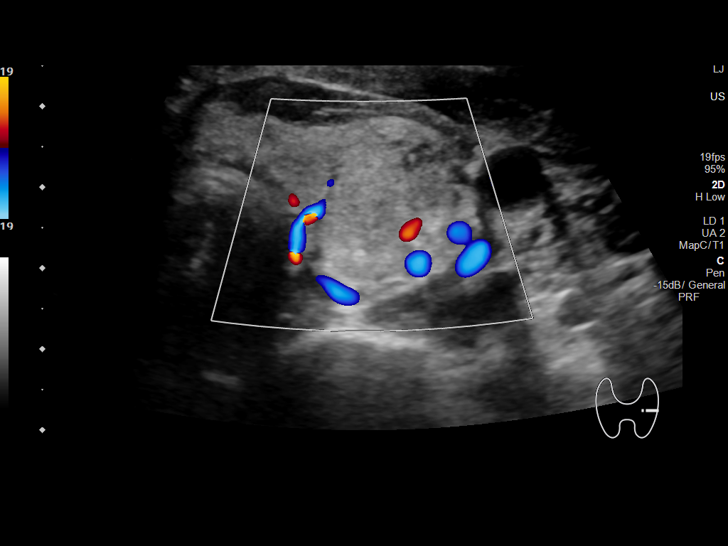
[im 51/87]
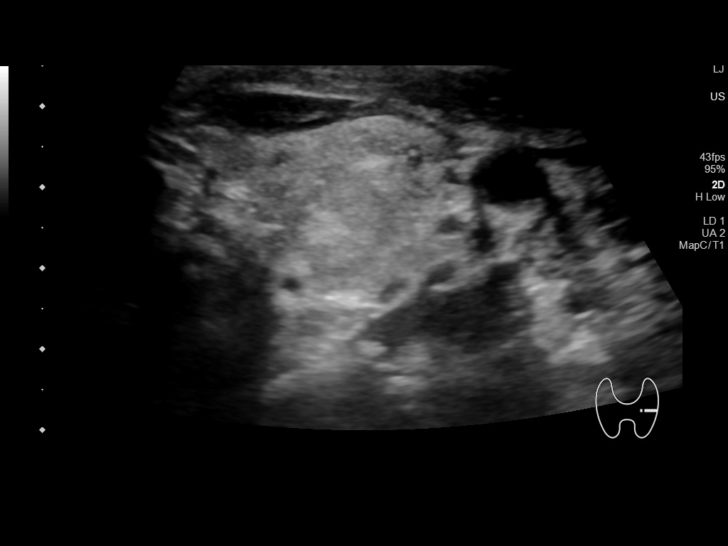
[im 58/87]
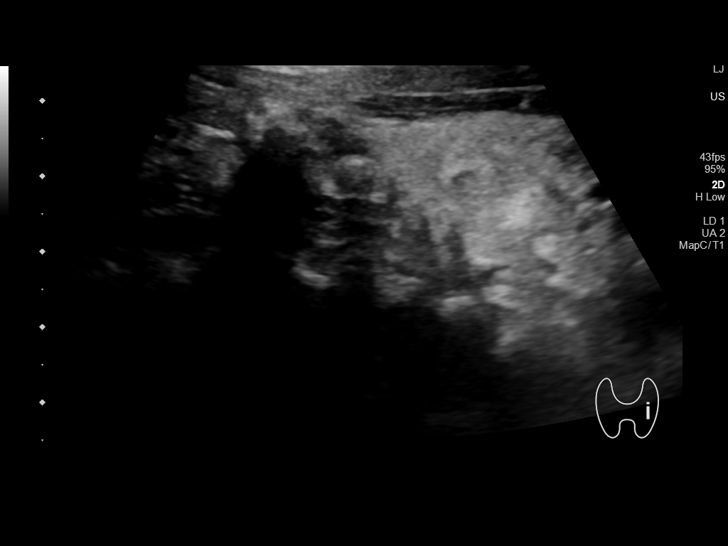
[im 65/87]
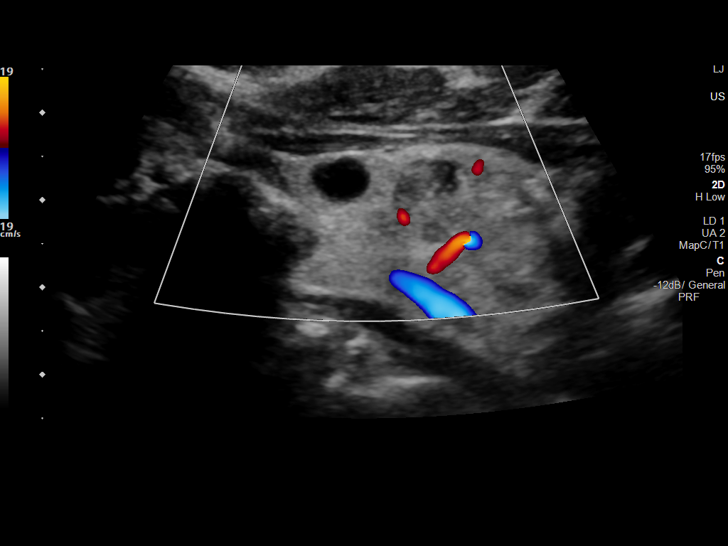
[im 72/87]
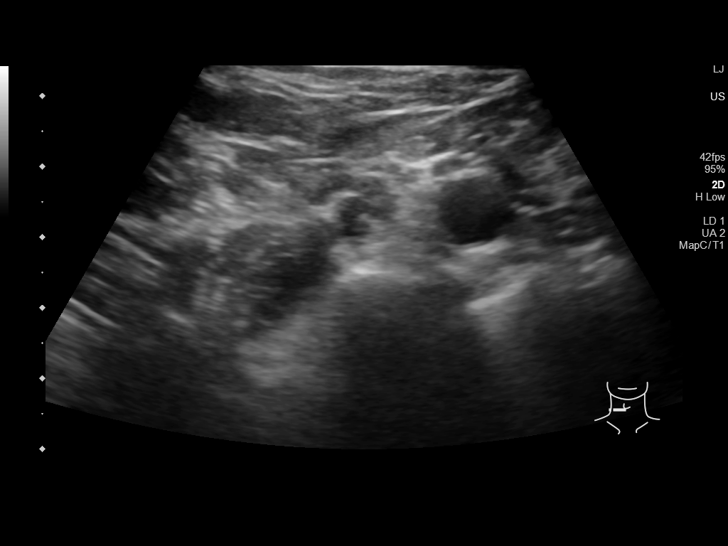
[im 79/87]
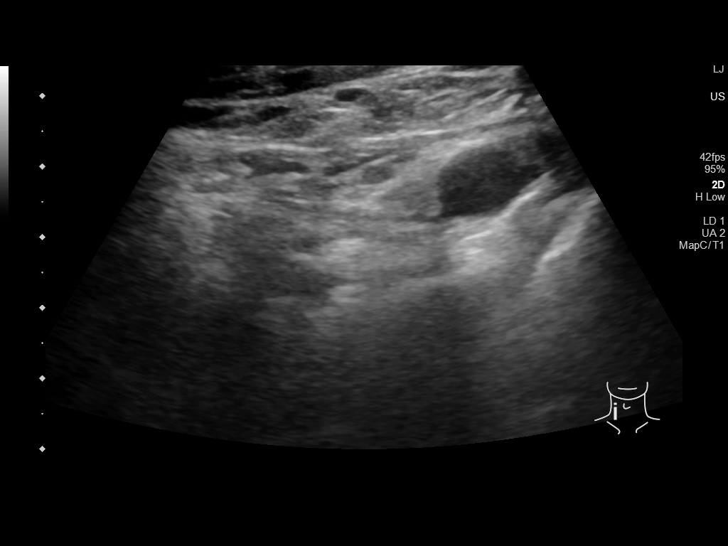
[im 87/87]
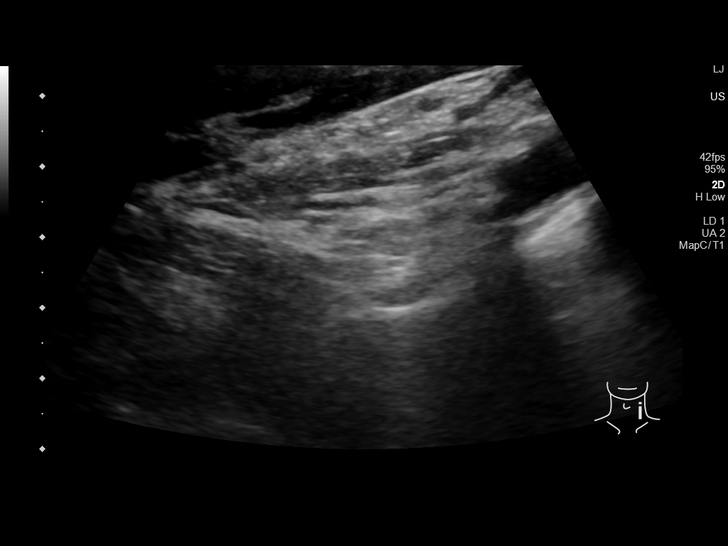

[13 of 25 positions shown; findings below may reference images not displayed]

FINDINGS: Parenchymal Echotexture: Moderately heterogenous

Isthmus: 0.5 cm

Right lobe: 4.4 x 1.5 x 2.0 cm

Left lobe: 4.6 x 1.9 x 2.2 cm

_________________________________________________________

Estimated total number of nodules >/= 1 cm: 4

Number of spongiform nodules >/=  2 cm not described below (TR1): 0

Number of mixed cystic and solid nodules >/= 1.5 cm not described
below (TR2): 0

_________________________________________________________

Nodule # 1 (labeled right 3 on images):

Location: Right; inferior

Maximum size: 0.9 cm; Other 2 dimensions: 0.9 x 0.8 cm

Composition: solid/almost completely solid (2)

Echogenicity: isoechoic (1)

Shape: not taller-than-wide (0)

Margins: ill-defined (0)

Echogenic foci: none (0)

ACR TI-RADS total points: 3.

ACR TI-RADS risk category: TR3 (3 points).

ACR TI-RADS recommendations:

Given size (<1.4 cm) and appearance, this nodule does NOT meet
TI-RADS criteria for biopsy or dedicated follow-up.

_________________________________________________________

Nodule # 2 (labeled left 2 on images):

Location: Left; mid

Maximum size: 1.1 cm; Other 2 dimensions: 0.9 x 0.9 cm

Composition: solid/almost completely solid (2)

Echogenicity: isoechoic (1)

Shape: not taller-than-wide (0)

Margins: smooth (0)

Echogenic foci: none (0)

ACR TI-RADS total points: 3.

ACR TI-RADS risk category: TR3 (3 points).

ACR TI-RADS recommendations:

Given size (<1.4 cm) and appearance, this nodule does NOT meet
TI-RADS criteria for biopsy or dedicated follow-up.

_________________________________________________________

Nodule # 3 (labeled left 3 on images):

Location: Left; inferior

Maximum size: 1.5 cm; Other 2 dimensions: 1.4 x 1.4 cm

Composition: solid/almost completely solid (2)

Echogenicity: isoechoic (1)

Shape: taller-than-wide (3)

Margins: ill-defined (0)

Echogenic foci: none (0)

ACR TI-RADS total points: 6.

ACR TI-RADS risk category: TR4 (4-6 points).

ACR TI-RADS recommendations:

**Given size (>/= 1.5 cm) and appearance, fine needle aspiration of
this moderately suspicious nodule should be considered based on
TI-RADS criteria.

_________________________________________________________
IMPRESSION: Nodule 3 (TI-RADS 4 - labeled left 3 on images) located in the
inferior left thyroid lobe, measuring 1.5 x 1.4 x 1.4 cm, meets
criteria for FNA.

The above is in keeping with the ACR TI-RADS recommendations - [HOSPITAL] 0968;[DATE].

## 2021-08-02 ENCOUNTER — Telehealth: Payer: Self-pay

## 2021-08-02 NOTE — Progress Notes (Signed)
    Chronic Care Management Pharmacy Assistant   Name: Kelly Santos  MRN: [id number] DOB: 1946-05-22  Reason for Encounter: Initial Visit Appointment: Telephone 08/09/21 @ 2 pm   Recent office visits:  06/14/21 [facility] ([facility]) - Left leg pain, Localized edema. No med changes.  [name] ([facility]) - Acute pain of left knee. Referral to Sports Medicine. No med changes.  Recent consult visits:  04/12/21 [name] (Sports Medicine) - Acute pain of left knee. No med changes.  03/15/21 [name] (Sports Medicine) - Acute pain of left knee. No med changes.  Hospital visits:  None in previous 6 months  Medications: Outpatient Encounter Medications as of 08/02/2021  Medication Sig   amitriptyline (ELAVIL) 25 MG tablet Take 1 tablet (25 mg total) by mouth at bedtime as needed for sleep. Due for routine OV   candesartan (ATACAND) 32 MG tablet Take 1 tablet (32 mg total) by mouth daily.   cholecalciferol (VITAMIN D) 1000 UNITS tablet Take 1,000 Units by mouth every other day.    cloNIDine (CATAPRES) 0.1 MG tablet Take 1 tablet (0.1 mg total) by mouth 2 (two) times daily as needed. For systolic [id number]   COMBIGAN 0.2-0.5 % ophthalmic solution INSTILL 1 DROP INTO BOTH EYES TWICE A DAY   Ferrous Sulfate (IRON) 325 (65 FE) MG TABS Take 1 tablet by mouth 3 (three) times a week.    hydrocortisone (PROCTOCORT) 1 % CREA Apply 1 application topically 2 (two) times daily as needed (dry skin).    loratadine (CLARITIN) 10 MG tablet Take 1 tablet (10 mg total) by mouth daily.   pantoprazole (PROTONIX) 40 MG tablet TAKE 1 TABLET BY MOUTH EVERY DAY   spironolactone (ALDACTONE) 25 MG tablet Take 1 tablet (25 mg total) by mouth daily.   triamcinolone cream (KENALOG) 0.5 % APPLY TOPICALLY 2 (TWO) TIMES DAILY AS NEEDED.   No facility-administered encounter medications on file as of 08/02/2021.    Care Gaps Colonoscopy - NA Diabetic Foot Exam - NA Mammogram - NA Ophthalmology - NA Dexa Scan - NA Annual Well Visit - NA Micro  albumin - NA Hemoglobin A1c - NA  Star Rating Drugs: Candesartan - filled 06/25/21  [name], [place] Clinical Pharmacists Assistant [phone]

## 2021-08-09 ENCOUNTER — Telehealth: Payer: Medicare Other

## 2021-08-09 NOTE — Progress Notes (Unsigned)
Chronic Care Management Pharmacy Note  08/09/2021 Name:  Kelly Santos MRN:  [id number] DOB:  1946/02/24  Summary: ***  Recommendations/Changes made from today's visit: ***  Plan: ***  Subjective: Kelly Santos is an 75 y.o. year old female who is a primary patient of Plotnikov, Evie Lacks, MD.  The CCM team was consulted for assistance with disease management and care coordination needs.    Engaged with patient by telephone for initial visit in response to provider referral for pharmacy case management and/or care coordination services.   Consent to Services:  The patient was given the following information about Chronic Care Management services today, agreed to services, and gave verbal consent: 1. CCM service includes personalized support from designated clinical staff supervised by the primary care provider, including individualized plan of care and coordination with other care providers 2. 24/7 contact phone numbers for assistance for urgent and routine care needs. 3. Service will only be billed when office clinical staff spend 20 minutes or more in a month to coordinate care. 4. Only one practitioner may furnish and bill the service in a calendar month. 5.The patient may stop CCM services at any time (effective at the end of the month) by phone call to the office staff. 6. The patient will be responsible for cost sharing (co-pay) of up to 20% of the service fee (after annual deductible is met). Patient agreed to services and consent obtained.  Patient Care Team: Plotnikov, Evie Lacks, MD as PCP - General Noe Goyer, Darnelle Maffucci, [facility] as Pharmacist (Pharmacist)  Recent office visits: 06/14/2021 - Dr. Quay Burow - increased swelling in legs x 1 week - ultrasound ordered to rule out DVT  03/09/2021 - Dr. Jenny Reichmann - evaluation of left knee pain - worsened within last 2 weeks - hydralazine started for HTN 01/25/2021 - Dr. Alain Marion - no changes to medications , f/u in 6 months   Recent consult  visits: 04/12/2021 - Dr. [name] - sports medicine - acute pain of left knee - continue conservative treatment methods - continue use of compression sleeve and voltaren gel 03/15/2021 - Dr. [name] - sports medicine - left knee pain after twisting knee about 3 weeks ago - use compression sleeve and voltaren gel - f/u in 4 weeks - consider steroid injection   Hospital visits: None in previous 6 months  Objective:  Lab Results  Component Value Date   CREATININE 0.79 01/25/2021   BUN 14 01/25/2021   GFR 73.57 01/25/2021   GFRNONAA >60 09/30/2020   GFRAA >60 06/06/2020   NA 139 01/25/2021   K 3.6 01/25/2021   CALCIUM 9.8 01/25/2021   CO2 30 01/25/2021   GLUCOSE 112 (H) 01/25/2021    Lab Results  Component Value Date/Time   HGBA1C 5.8 (H) 01/23/2017 12:03 PM   HGBA1C 5.9 03/24/2015 08:56 AM   GFR 73.57 01/25/2021 11:46 AM   GFR 63.14 07/28/2020 01:10 PM    Last diabetic Eye exam:  No results found for: HMDIABEYEEXA  Last diabetic Foot exam:  No results found for: HMDIABFOOTEX   Lab Results  Component Value Date   CHOL 152 01/25/2021   HDL 43.70 01/25/2021   LDLCALC 69 01/25/2021   TRIG 195.0 (H) 01/25/2021   CHOLHDL 3 01/25/2021    Hepatic Function Latest Ref Rng & Units 01/25/2021 06/06/2020 03/06/2020  Total Protein 6.0 - 8.3 g/dL 7.3 7.8 7.4  Albumin 3.5 - 5.2 g/dL 3.9 3.9 3.9  AST 0 - 37 U/L _0 ALT  0 - 35 U/L 19 24 32  Alk Phosphatase 39 - 117 U/L 71 54 49  Total Bilirubin 0.2 - 1.2 mg/dL 0.4 0.5 0.3  Bilirubin, Direct 0.0 - 0.2 mg/dL - 0.1 -    Lab Results  Component Value Date/Time   TSH 0.75 01/25/2021 11:46 AM   TSH 0.65 01/26/2020 11:29 AM    CBC Latest Ref Rng & Units 01/25/2021 09/30/2020 06/06/2020  WBC 4.0 - 10.5 K/uL 4.1 5.9 3.8(L)  Hemoglobin 12.0 - 15.0 g/dL 12.8 12.9 13.4  Hematocrit 36.0 - 46.0 % 37.7 38.0 40.8  Platelets 150.0 - 400.0 K/uL 180.0 246 201    Lab Results  Component Value Date/Time   VD25OH 38.03 08/25/2019 11:29 AM   VD25OH  25.84 (L) 11/02/2015 10:15 AM    Clinical ASCVD: No  The 10-year ASCVD risk score (Arnett DK, et al., 2019) is: 12.8%   Values used to calculate the score:     Age: 71 years     Sex: Female     Is Non-Hispanic African American: Yes     Diabetic: No     Tobacco smoker: No     Systolic Blood Pressure: 861 mmHg     Is BP treated: Yes     HDL Cholesterol: 43.7 mg/dL     Total Cholesterol: 152 mg/dL    Depression screen [facility] 2/9 03/09/2021 01/26/2020 05/22/2018  Decreased Interest 0 0 0  Down, Depressed, Hopeless 0 0 0  PHQ - 2 Score 0 0 0  Some recent data might be hidden    Social History   Tobacco Use  Smoking Status Never  Smokeless Tobacco Never   BP Readings from Last 3 Encounters:  06/14/21 (!) 142/88  04/12/21 120/82  03/15/21 124/84   Pulse Readings from Last 3 Encounters:  06/14/21 68  04/12/21 67  03/15/21 69   Wt Readings from Last 3 Encounters:  06/14/21 165 lb (74.8 kg)  04/12/21 161 lb 12.8 oz (73.4 kg)  03/15/21 163 lb 6.4 oz (74.1 kg)   BMI Readings from Last 3 Encounters:  06/14/21 29.23 kg/m  04/12/21 28.66 kg/m  03/15/21 28.95 kg/m    Assessment/Interventions: Review of patient past medical history, allergies, medications, health status, including review of consultants reports, laboratory and other test data, was performed as part of comprehensive evaluation and provision of chronic care management services.   SDOH:  (Social Determinants of Health) assessments and interventions performed: {yes/no:20286}  SDOH Screenings   Alcohol Screen: Not on file  Depression (PHQ2-9): Low Risk    PHQ-2 Score: 0  Financial Resource Strain: Not on file  Food Insecurity: Not on file  Housing: Not on file  Physical Activity: Not on file  Social Connections: Not on file  Stress: Not on file  Tobacco Use: Low Risk    Smoking Tobacco Use: Never   Smokeless Tobacco Use: Never   Passive Exposure: Not on file  Transportation Needs: Not on file    CCM Care  Plan  Allergies  Allergen Reactions   Amlodipine     edema   Hctz [Hydrochlorothiazide]     Hypokalemia - resolved off HCTZ   Metoprolol     fatigue    Medications Reviewed Today     Reviewed by [name], MD (Physician) on 06/14/21 at [facility] List Status: <None>   Medication Order Taking? Sig Documenting Provider Last Dose Status Informant  amitriptyline (ELAVIL) 25 MG tablet [id number] Yes Take 1 tablet (25 mg total) by  mouth at bedtime as needed for sleep. Due for routine OV Plotnikov, Evie Lacks, MD Taking Active   candesartan (ATACAND) 32 MG tablet [id number] Yes Take 1 tablet (32 mg total) by mouth daily. Plotnikov, Evie Lacks, MD Taking Active   cholecalciferol (VITAMIN D) 1000 UNITS tablet [id number] Yes Take 1,000 Units by mouth every other day.  [provider] Taking Active Self  cloNIDine (CATAPRES) 0.1 MG tablet [id number] Yes Take 1 tablet (0.1 mg total) by mouth 2 (two) times daily as needed. For systolic HY>865 Plotnikov, Evie Lacks, MD Taking Active   COMBIGAN 0.2-0.5 % ophthalmic solution [id number] Yes INSTILL 1 DROP INTO BOTH EYES TWICE A DAY Plotnikov, Evie Lacks, MD Taking Active   Ferrous Sulfate (IRON) 325 (65 FE) MG TABS [id number] Yes Take 1 tablet by mouth 3 (three) times a week.  [provider] Taking Active Self  hydrocortisone (PROCTOCORT) 1 % CREA [id number] Yes Apply 1 application topically 2 (two) times daily as needed (dry skin).  [provider] Taking Active Self  loratadine (CLARITIN) 10 MG tablet [id number] Yes Take 1 tablet (10 mg total) by mouth daily. Plotnikov, Evie Lacks, MD Taking Active   pantoprazole (PROTONIX) 40 MG tablet [id number] Yes TAKE 1 TABLET BY MOUTH EVERY DAY Plotnikov, Evie Lacks, MD Taking Active   spironolactone (ALDACTONE) 25 MG tablet [id number] Yes Take 1 tablet (25 mg total) by mouth daily. Plotnikov, Evie Lacks, MD Taking Active   triamcinolone cream (KENALOG) 0.5 % [id number] Yes APPLY TOPICALLY 2 (TWO)  TIMES DAILY AS NEEDED. Plotnikov, Evie Lacks, MD Taking Active             Patient Active Problem List   Diagnosis Date Noted   Left leg pain 06/14/2021   Goiter 01/25/2021   Hiatal hernia 06/09/2020   Dysphagia 06/09/2020   Chronic venous insufficiency 03/15/2020   New daily persistent headache 08/25/2019   Paresthesia 08/25/2019   Foot pain, right 03/24/2019   Epigastric pain 07/08/2018   Oral ulcer 05/22/2018   Neck pain 05/08/2018   Pleural effusion 05/08/2018   RLQ abdominal pain 11/14/2017   Nausea 11/14/2017   Edema 02/28/2017   Acute upper respiratory infection 05/07/2016   Cough 05/07/2016   Eczema of face 01/22/2016   Fatigue 11/02/2015   Hyperglycemia 03/24/2015   Well adult exam 11/27/2012   Colon polyp, hyperplastic 11/27/2012   Low back pain 08/03/2012   Foot pain, left 04/14/2012   Callus of foot 04/14/2012   Leg pain, left 10/08/3233   Diastolic dysfunction 57/32/2025   Hypokalemia 05/15/2011   Thyrotoxicosis 03/26/2010   RECTAL BLEEDING 03/26/2010   HEMORRHOIDS 03/23/2010   Anemia due to chronic blood loss 03/23/2010   HERPES ZOSTER 11/09/2009   Febrile illness, acute 11/03/2009   Pain in limb 10/04/2009   Headache 10/04/2009   TINEA PEDIS 06/20/2009   ALLERGIC RHINITIS 12/15/2008   Chest pain, atypical 11/08/2008   CERVICAL RADICULOPATHY, RIGHT 03/25/2008   KNEE PAIN, ACUTE 01/18/2008   Vitamin D deficiency 11/21/2007   ABNORMAL CHEST XRAY 11/21/2007   Hypertensive disorder 11/18/2007   GERD 11/18/2007   DIVERTICULOSIS, COLON 11/18/2007   OSTEOARTHRITIS 11/18/2007    Immunization History  Administered Date(s) Administered   PFIZER(Purple Top)SARS-COV-2 Vaccination 09/20/2020, 10/18/2020   Pneumococcal Conjugate-13 01/31/2015   Pneumococcal Polysaccharide-23 04/27/2013   Td 09/21/2010    Conditions to be addressed/monitored:  {USCCMDZASSESSMENTOPTIONS:23563}  There are no care plans that you recently modified to display for this  patient.     Medication Assistance: {MEDASSISTANCEINFO:25044}  Compliance/Adherence/Medication fill history: Care Gaps: ***  Patient's preferred pharmacy is:  [id number] [place], [place]Phone: [phone]: [phone] [facility] [place] [address]Phone: [phone]: [phone]  Uses pill box? {Yes or If no, why not?:20788} Pt endorses ***% compliance  Care Plan and Follow Up Patient Decision:  {FOLLOWUP:24991}  Plan: {CM FOLLOW UP PYOMA:00459} ***  Current Barriers:  {pharmacybarriers:24917}  Pharmacist Clinical Goal(s):  Patient will {PHARMACYGOALCHOICES:24921} through collaboration with PharmD and provider.   Interventions: 1:1 collaboration with Plotnikov, AEvie Lacks MD regarding development and update of comprehensive plan of care as evidenced by provider attestation and co-signature Inter-disciplinary care team collaboration (see longitudinal plan of care) Comprehensive medication review performed; medication list updated in electronic medical record  Hypertension (BP goal <140/90) -{US controlled/uncontrolled:25276} -Current treatment: Spironolactone 236m- 1 tablet daily  Candesartan 3247m 1 tablet daily  Clonidine 0.1mg74m1 tablet twice daily  -Medications previously tried: amlodipine, benazepril, azilsartan, chlorthalidone, carvedilol, metoprolol, losartan, triamterene/hctz,   -Current home readings: *** -Current dietary habits: *** -Current exercise habits: *** -{ACTIONS;DENIES/REPORTS:21021675::"Denies"} hypotensive/hypertensive symptoms -Educated on {CCM BP Counseling:25124} -Counseled to monitor BP at home ***, document, and provide log at future appointments -{CCMPHARMDINTERVENTION:25122}  GERD (Goal: Prevention/  control of acid reflux) -{US controlled/uncontrolled:25276} -Current treatment  Pantoprazole 40mg28m tablet daily  -Medications previously tried: ranitidine,   -{CCMPHARMDINTERVENTION:25122}  Insomnia (Goal: Promotion of quality sleep) -{US controlled/uncontrolled:25276} -Current treatment  Amitriptyline 25mg 50mtablet daily at bedtime as needed  -Medications previously tried: ***  -{CCMPHARMDINTERVENTION:25122}   Health Maintenance -Vaccine gaps: *** -Current therapy:  Loratadine 10mg -31mablet daily as needed  Triamcinolone 0.5% cream - applied twice daily as needed  Hydrocortisone 1% cream - applied twice daily as needed  Ferrous Sulfate 325mg - 53mblet 3 times weekly Vitamin D3 1000units-  1 tablet daily  Combigan 0.2-0.5% - 1 drop into both eyes twice daily  -Educated on {ccm supplement counseling:25128} -{CCM Patient satisfied:25129} -{CCMPHARMDINTERVENTION:25122}  Patient Goals/Self-Care Activities Patient will:  - {pharmacypatientgoals:24919}  Follow Up Plan: {CM FOLLOW UP PLAN:222XHFS:14239}t chart prep 14 minutes

## 2021-08-21 DIAGNOSIS — H401111 Primary open-angle glaucoma, right eye, mild stage: Secondary | ICD-10-CM | POA: Diagnosis not present

## 2021-08-21 DIAGNOSIS — H401122 Primary open-angle glaucoma, left eye, moderate stage: Secondary | ICD-10-CM | POA: Diagnosis not present

## 2021-08-29 ENCOUNTER — Telehealth: Payer: Medicare Other

## 2021-08-29 NOTE — Progress Notes (Deleted)
Chronic Care Management Pharmacy Note  08/29/2021 Name:  Kelly Santos MRN:  [id number] DOB:  [birth year]  Summary: ***  Recommendations/Changes made from today's visit: ***  Plan: ***  Subjective: Kelly Santos is an 75 y.o. year old female who is a primary patient of Plotnikov, Evie Lacks, MD.  The CCM team was consulted for assistance with disease management and care coordination needs.    Engaged with patient by telephone for initial visit in response to provider referral for pharmacy case management and/or care coordination services.   Consent to Services:  The patient was given the following information about Chronic Care Management services today, agreed to services, and gave verbal consent: 1. CCM service includes personalized support from designated clinical staff supervised by the primary care provider, including individualized plan of care and coordination with other care providers 2. 24/7 contact phone numbers for assistance for urgent and routine care needs. 3. Service will only be billed when office clinical staff spend 20 minutes or more in a month to coordinate care. 4. Only one practitioner may furnish and bill the service in a calendar month. 5.The patient may stop CCM services at any time (effective at the end of the month) by phone call to the office staff. 6. The patient will be responsible for cost sharing (co-pay) of up to 20% of the service fee (after annual deductible is met). Patient agreed to services and consent obtained.  Patient Care Team: Plotnikov, Evie Lacks, MD as PCP - General Alonzo Owczarzak, Darnelle Maffucci, [facility] as Pharmacist (Pharmacist)  Recent office visits: 06/14/2021 - Dr. Quay Burow - evaluation of left leg pain - [place] of left leg ordered - negative for DVT  03/09/2021 - Dr. Jenny Reichmann - evaluation of chronic left knee pain - declined to add hydralazine for HTN - referred to sports medicine for knee pain 01/25/2021 - Dr. Alain Marion - 3 month f/u - no changes sot medications -  f/u in 3 months   Recent consult visits: 04/12/2021 - Dr. [name] - Sports Medicine - pain of left knee - continued conservative treatment  - possible steroid injection should pain worsen  03/15/2021 - Dr. 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  - to use compression knee sleeve and voltaren gel   Hospital visits: {Hospital DC Yes/No:25215}  Objective:  Lab Results  Component Value Date   CREATININE 0.79 01/25/2021   BUN 14 01/25/2021   GFR 73.57 01/25/2021   GFRNONAA >60 09/30/2020   GFRAA >60 06/06/2020   NA 139 01/25/2021   K 3.6 01/25/2021   CALCIUM 9.8 01/25/2021   CO2 30 01/25/2021   GLUCOSE 112 (H) 01/25/2021    Lab Results  Component Value Date/Time   HGBA1C 5.8 (H) 01/23/2017 12:03 PM   HGBA1C 5.9 03/24/2015 08:56 AM   GFR 73.57 01/25/2021 11:46 AM   GFR 63.14 07/28/2020 01:10 PM    Last diabetic Eye exam:  No results found for: HMDIABEYEEXA  Last diabetic Foot exam:  No results found for: HMDIABFOOTEX   Lab Results  Component Value Date   CHOL 152 01/25/2021   HDL 43.70 01/25/2021   LDLCALC 69 01/25/2021   TRIG 195.0 (H) 01/25/2021   CHOLHDL 3 01/25/2021    Hepatic Function Latest Ref Rng & Units 01/25/2021 06/06/2020 03/06/2020  Total Protein 6.0 - 8.3 g/dL 7.3 7.8 7.4  Albumin 3.5 - 5.2 g/dL 3.9 3.9 3.9  AST 0 -  37 U/L '18 22 29  ' ALT 0 - 35 U/L 19 24 32  Alk Phosphatase 39 - 117 U/L 71 54 49  Total Bilirubin 0.2 - 1.2 mg/dL 0.4 0.5 0.3  Bilirubin, Direct 0.0 - 0.2 mg/dL - 0.1 -    Lab Results  Component Value Date/Time   TSH 0.75 01/25/2021 11:46 AM   TSH 0.65 01/26/2020 11:29 AM    CBC Latest Ref Rng & Units 01/25/2021 09/30/2020 06/06/2020  WBC 4.0 - 10.5 K/uL 4.1 5.9 3.8(L)  Hemoglobin 12.0 - 15.0 g/dL 12.8 12.9 13.4  Hematocrit 36.0 - 46.0 % 37.7 38.0 40.8  Platelets 150.0 - 400.0 K/uL 180.0 246 201    Lab Results  Component Value Date/Time   VD25OH 38.03  08/25/2019 11:29 AM   VD25OH 25.84 (L) 11/02/2015 10:15 AM    Clinical ASCVD: No  The 10-year ASCVD risk score (Arnett DK, et al., 2019) is: 12.8%   Values used to calculate the score:     Age: 30 years     Sex: Female     Is Non-Hispanic African American: Yes     Diabetic: No     Tobacco smoker: No     Systolic Blood Pressure: 654 mmHg     Is BP treated: Yes     HDL Cholesterol: 43.7 mg/dL     Total Cholesterol: 152 mg/dL    Depression screen [facility] 2/9 03/09/2021 01/26/2020 05/22/2018  Decreased Interest 0 0 0  Down, Depressed, Hopeless 0 0 0  PHQ - 2 Score 0 0 0  Some recent data might be hidden     Social History   Tobacco Use  Smoking Status Never  Smokeless Tobacco Never   BP Readings from Last 3 Encounters:  06/14/21 (!) 142/88  04/12/21 120/82  03/15/21 124/84   Pulse Readings from Last 3 Encounters:  06/14/21 68  04/12/21 67  03/15/21 69   Wt Readings from Last 3 Encounters:  06/14/21 165 lb (74.8 kg)  04/12/21 161 lb 12.8 oz (73.4 kg)  03/15/21 163 lb 6.4 oz (74.1 kg)   BMI Readings from Last 3 Encounters:  06/14/21 29.23 kg/m  04/12/21 28.66 kg/m  03/15/21 28.95 kg/m    Assessment/Interventions: Review of patient past medical history, allergies, medications, health status, including review of consultants reports, laboratory and other test data, was performed as part of comprehensive evaluation and provision of chronic care management services.   SDOH:  (Social Determinants of Health) assessments and interventions performed: Yes  SDOH Screenings   Alcohol Screen: Not on file  Depression (PHQ2-9): Low Risk    PHQ-2 Score: 0  Financial Resource Strain: Not on file  Food Insecurity: Not on file  Housing: Not on file  Physical Activity: Not on file  Social Connections: Not on file  Stress: Not on file  Tobacco Use: Low Risk    Smoking Tobacco Use: Never   Smokeless Tobacco Use: Never   Passive Exposure: Not on file  Transportation Needs: Not on  file    CCM Care Plan  Allergies  Allergen Reactions   Amlodipine     edema   Hctz [Hydrochlorothiazide]     Hypokalemia - resolved off HCTZ   Metoprolol     fatigue    Medications Reviewed Today     Reviewed by [name], MD (Physician) on 06/14/21 at [place] List Status: <None>   Medication Order Taking? Sig Documenting Provider Last Dose Status Informant  amitriptyline (ELAVIL) 25 MG tablet [id number]  Yes Take 1 tablet (25 mg total) by mouth at bedtime as needed for sleep. Due for routine OV Plotnikov, Evie Lacks, MD Taking Active   candesartan (ATACAND) 32 MG tablet [id number] Yes Take 1 tablet (32 mg total) by mouth daily. Plotnikov, Evie Lacks, MD Taking Active   cholecalciferol (VITAMIN D) 1000 UNITS tablet [id number] Yes Take 1,000 Units by mouth every other day.  [provider] Taking Active Self  cloNIDine (CATAPRES) 0.1 MG tablet [id number] Yes Take 1 tablet (0.1 mg total) by mouth 2 (two) times daily as needed. For systolic DZ>329 Plotnikov, Evie Lacks, MD Taking Active   COMBIGAN 0.2-0.5 % ophthalmic solution [id number] Yes INSTILL 1 DROP INTO BOTH EYES TWICE A DAY Plotnikov, Evie Lacks, MD Taking Active   Ferrous Sulfate (IRON) 325 (65 FE) MG TABS [id number] Yes Take 1 tablet by mouth 3 (three) times a week.  [provider] Taking Active Self  hydrocortisone (PROCTOCORT) 1 % CREA [id number] Yes Apply 1 application topically 2 (two) times daily as needed (dry skin).  [provider] Taking Active Self  loratadine (CLARITIN) 10 MG tablet [id number] Yes Take 1 tablet (10 mg total) by mouth daily. Plotnikov, Evie Lacks, MD Taking Active   pantoprazole (PROTONIX) 40 MG tablet [id number] Yes TAKE 1 TABLET BY MOUTH EVERY DAY Plotnikov, Evie Lacks, MD Taking Active   spironolactone (ALDACTONE) 25 MG tablet [id number] Yes Take 1 tablet (25 mg total) by mouth daily. Plotnikov, Evie Lacks, MD Taking Active   triamcinolone cream (KENALOG) 0.5 % [id number] Yes APPLY  TOPICALLY 2 (TWO) TIMES DAILY AS NEEDED. Plotnikov, Evie Lacks, MD Taking Active             Patient Active Problem List   Diagnosis Date Noted   Left leg pain 06/14/2021   Goiter 01/25/2021   Hiatal hernia 06/09/2020   Dysphagia 06/09/2020   Chronic venous insufficiency 03/15/2020   New daily persistent headache 08/25/2019   Paresthesia 08/25/2019   Foot pain, right 03/24/2019   Epigastric pain 07/08/2018   Oral ulcer 05/22/2018   Neck pain 05/08/2018   Pleural effusion 05/08/2018   RLQ abdominal pain 11/14/2017   Nausea 11/14/2017   Edema 02/28/2017   Acute upper respiratory infection 05/07/2016   Cough 05/07/2016   Eczema of face 01/22/2016   Fatigue 11/02/2015   Hyperglycemia 03/24/2015   Well adult exam 11/27/2012   Colon polyp, hyperplastic 11/27/2012   Low back pain 08/03/2012   Foot pain, left 04/14/2012   Callus of foot 04/14/2012   Leg pain, left 76/72/0947   Diastolic dysfunction 09/62/8366   Hypokalemia 05/15/2011   Thyrotoxicosis 03/26/2010   RECTAL BLEEDING 03/26/2010   HEMORRHOIDS 03/23/2010   Anemia due to chronic blood loss 03/23/2010   HERPES ZOSTER 11/09/2009   Febrile illness, acute 11/03/2009   Pain in limb 10/04/2009   Headache 10/04/2009   TINEA PEDIS 06/20/2009   ALLERGIC RHINITIS 12/15/2008   Chest pain, atypical 11/08/2008   CERVICAL RADICULOPATHY, RIGHT 03/25/2008   KNEE PAIN, ACUTE 01/18/2008   Vitamin D deficiency 11/21/2007   ABNORMAL CHEST XRAY 11/21/2007   Hypertensive disorder 11/18/2007   GERD 11/18/2007   DIVERTICULOSIS, COLON 11/18/2007   OSTEOARTHRITIS 11/18/2007    Immunization History  Administered Date(s) Administered   PFIZER(Purple Top)SARS-COV-2 Vaccination 09/20/2020, 10/18/2020   Pneumococcal Conjugate-13 01/31/2015   Pneumococcal Polysaccharide-23 04/27/2013   Td 09/21/2010    Conditions to be addressed/monitored:  {USCCMDZASSESSMENTOPTIONS:23563}  There are no care plans that you recently modified to  display for this  patient.     Medication Assistance: {MEDASSISTANCEINFO:25044}  Compliance/Adherence/Medication fill history: Care Gaps: ***  Patient's preferred pharmacy is:  [id number] [place], [place]Phone: [phone]: [phone] [facility] [address] [address]Phone: [phone]: [phone]  Uses pill box? {Yes or If no, why not?:20788} Pt endorses ***% compliance  Care Plan and Follow Up Patient Decision:  {FOLLOWUP:24991}  Plan: {CM FOLLOW UP PKNZU:36725} ***  Current Barriers:  {pharmacybarriers:24917}  Pharmacist Clinical Goal(s):  Patient will {PHARMACYGOALCHOICES:24921} through collaboration with PharmD and provider.   Interventions: 1:1 collaboration with Plotnikov, AEvie Lacks MD regarding development and update of comprehensive plan of care as evidenced by provider attestation and co-signature Inter-disciplinary care team collaboration (see longitudinal plan of care) Comprehensive medication review performed; medication list updated in electronic medical record  Hypertension (BP goal <140/90) -{US controlled/uncontrolled:25276} -Current treatment: Candesartan 317m- 1 tablet daily  Clonidine 0.35m4m 1 tablet twice daily  Spironolactone 65m49m1 tablet daily -Medications previously tried: ***  -Current home readings: *** -Current dietary habits: *** -Current exercise habits: *** -{ACTIONS;DENIES/REPORTS:21021675::"Denies"} hypotensive/hypertensive symptoms -Educated on {CCM BP Counseling:25124} -Counseled to monitor BP at home ***, document, and provide log at future appointments -{CCMPHARMDINTERVENTION:25122}  GERD (Goal: Prevention / control of acid reflux) -{US controlled/uncontrolled:25276} -Current treatment   Pantoprazole 40mg47m tablet daily  -Medications previously tried: ***  -{CCMPHARMDINTERVENTION:25122}  Health Maintenance -Vaccine gaps: *** -Current therapy:  Triamcinolone 0.5% cream - applied twice daily as needed  Hydrocortisone 1% cream - applied twice daily as needed  Ferrous Sulfate 365mg 68mtabelt daily  Vitamin D 1000units - 1 tablet daily  Loratadine 10mg -69mblet daily  -Educated on {ccm supplement counseling:25128} -{CCM Patient satisfied:25129} -{CCMPHARMDINTERVENTION:25122}  Patient Goals/Self-Care Activities Patient will:  - {pharmacypatientgoals:24919}  Follow Up Plan: {CM FOLLOW UP PLAN:22HQIT:64290}ent chart prep - 12 minutes

## 2021-09-18 ENCOUNTER — Telehealth: Payer: Self-pay | Admitting: *Deleted

## 2021-09-18 NOTE — Telephone Encounter (Signed)
Please schedule office visit with any available provider

## 2021-09-18 NOTE — Telephone Encounter (Signed)
Rec'd call from PA from [facility] states he was at pt house and her BP is abnormal at 180/100. Pt has taken emergency clonidine, but she refuse to go to ER. Denies any CP, light headness, or dizziness. Would like pt to be seen by PCP, but we have no appts. Inform Kelly Santos will send to Dr. Camila Li to get advisement and call her back [phone]

## 2021-09-19 NOTE — Telephone Encounter (Signed)
There is no available appt.. Do u want to work in tomorrow.Marland KitchenJohny Chess

## 2021-09-20 NOTE — Telephone Encounter (Signed)
Called pt gave her MD response. She staates that er BP has came down since taking the clonidine. Not sure why BP was up so high the other day. Last night her BP was 139/66. She states she check this am 143/96 and recheck right before 12, and it was 143/87. She states she would rather come another time since it is raining. Inform MD of pt response. MD stated BP is ok and can make appt in January to follow-up. Inform pt what MD states, and to advise if BP get back up to make sure she takes her clonidine. Made appt for 10/09/21.Marland KitchenJohny Chess

## 2021-09-20 NOTE — Telephone Encounter (Signed)
We can try 4:20.  She may need to wait.  Thanks

## 2021-10-09 ENCOUNTER — Encounter: Payer: Self-pay | Admitting: Internal Medicine

## 2021-10-09 ENCOUNTER — Ambulatory Visit (INDEPENDENT_AMBULATORY_CARE_PROVIDER_SITE_OTHER): Payer: Medicare Other | Admitting: Internal Medicine

## 2021-10-09 ENCOUNTER — Other Ambulatory Visit: Payer: Self-pay

## 2021-10-09 DIAGNOSIS — R600 Localized edema: Secondary | ICD-10-CM

## 2021-10-09 DIAGNOSIS — I10 Essential (primary) hypertension: Secondary | ICD-10-CM

## 2021-10-09 DIAGNOSIS — R739 Hyperglycemia, unspecified: Secondary | ICD-10-CM

## 2021-10-09 LAB — COMPREHENSIVE METABOLIC PANEL
ALT: 16 U/L (ref 0–35)
AST: 16 U/L (ref 0–37)
Albumin: 3.8 g/dL (ref 3.5–5.2)
Alkaline Phosphatase: 65 U/L (ref 39–117)
BUN: 9 mg/dL (ref 6–23)
CO2: 26 mEq/L (ref 19–32)
Calcium: 9.3 mg/dL (ref 8.4–10.5)
Chloride: 106 mEq/L (ref 96–112)
Creatinine, Ser: 0.74 mg/dL (ref 0.40–1.20)
GFR: 79.18 mL/min (ref >60.00)
Glucose, Bld: 87 mg/dL (ref 70–99)
Potassium: 3.7 mEq/L (ref 3.5–5.1)
Sodium: 138 mEq/L (ref 135–145)
Total Bilirubin: 0.4 mg/dL (ref 0.2–1.2)
Total Protein: 7.1 g/dL (ref 6.0–8.3)

## 2021-10-09 LAB — HEMOGLOBIN A1C: Hgb A1c MFr Bld: 6.2 % (ref 4.6–6.5)

## 2021-10-09 MED ORDER — NEBIVOLOL HCL 10 MG PO TABS: 10.0000 mg | ORAL_TABLET | Freq: Every day | ORAL | 11 refills | Status: DC

## 2021-10-09 NOTE — Assessment & Plan Note (Addendum)
Refractory.  SBP 160-170 in am and 130-146 at night. Sometimes BP is nl Cont w/candesartan and spironolactone in am Add Bystolic at hs start with 1/2 a day Use Catapress prn Will ref to [facility]

## 2021-10-09 NOTE — Assessment & Plan Note (Signed)
Check A1c. 

## 2021-10-09 NOTE — Assessment & Plan Note (Signed)
No relapse 

## 2021-10-09 NOTE — Progress Notes (Signed)
Subjective:  Patient ID: Kelly Santos, female    DOB: [birth year]  Age: 76 y.o. MRN: [id number]  CC: Follow-up (B/p follow up)   HPI Margarie T Turnbo presents for HTN F/u edema and elevated sugar SBP 160-170 in am and 130-146 at night. Sometimes BP is nl F/u on edema and hyperglycemia   Outpatient Medications Prior to Visit  Medication Sig Dispense Refill   amitriptyline (ELAVIL) 25 MG tablet Take 1 tablet (25 mg total) by mouth at bedtime as needed for sleep. Due for routine OV 30 tablet 5   candesartan (ATACAND) 32 MG tablet Take 1 tablet (32 mg total) by mouth daily. 90 tablet 3   cholecalciferol (VITAMIN D) 1000 UNITS tablet Take 1,000 Units by mouth every other day.      cloNIDine (CATAPRES) 0.1 MG tablet Take 1 tablet (0.1 mg total) by mouth 2 (two) times daily as needed. For systolic WU>981 60 tablet 3   COMBIGAN 0.2-0.5 % ophthalmic solution INSTILL 1 DROP INTO BOTH EYES TWICE A DAY 5 mL 2   Ferrous Sulfate (IRON) 325 (65 FE) MG TABS Take 1 tablet by mouth 3 (three) times a week.      hydrocortisone (PROCTOCORT) 1 % CREA Apply 1 application topically 2 (two) times daily as needed (dry skin).      loratadine (CLARITIN) 10 MG tablet Take 1 tablet (10 mg total) by mouth daily. 30 tablet 11   pantoprazole (PROTONIX) 40 MG tablet TAKE 1 TABLET BY MOUTH EVERY DAY 90 tablet 3   spironolactone (ALDACTONE) 25 MG tablet Take 1 tablet (25 mg total) by mouth daily. 90 tablet 3   triamcinolone cream (KENALOG) 0.5 % APPLY TOPICALLY 2 (TWO) TIMES DAILY AS NEEDED. 30 g 0   brimonidine (ALPHAGAN) 0.2 % ophthalmic solution SMARTSIG:1 Drop(s) In Eye(s) Every 12 Hours     timolol (TIMOPTIC) 0.5 % ophthalmic solution 1 drop every morning.     No facility-administered medications prior to visit.    ROS: Review of Systems  Constitutional:  Negative for activity change, appetite change, chills, fatigue and unexpected weight change.  HENT:  Negative for congestion, mouth sores and sinus pressure.    Eyes:  Negative for visual disturbance.  Respiratory:  Negative for cough and chest tightness.   Gastrointestinal:  Negative for abdominal pain and nausea.  Genitourinary:  Negative for difficulty urinating, frequency and vaginal pain.  Musculoskeletal:  Negative for back pain and gait problem.  Skin:  Negative for pallor and rash.  Neurological:  Negative for dizziness, tremors, weakness, numbness and headaches.  Psychiatric/Behavioral:  Negative for confusion and sleep disturbance.    Objective:  BP (!) 162/90 (BP Location: Left Arm, Patient Position: Sitting, Cuff Size: Normal)    Pulse 62    Temp 98.9 F (37.2 C) (Oral)    Ht 5\' 3"  (1.6 m)    Wt 175 lb 9.6 oz (79.7 kg)    SpO2 97%    BMI 31.11 kg/m   BP Readings from Last 3 Encounters:  10/09/21 (!) 162/90  06/14/21 (!) 142/88  04/12/21 120/82    Wt Readings from Last 3 Encounters:  10/09/21 175 lb 9.6 oz (79.7 kg)  06/14/21 165 lb (74.8 kg)  04/12/21 161 lb 12.8 oz (73.4 kg)    Physical Exam Constitutional:      General: She is not in acute distress.    Appearance: She is well-developed.  HENT:     Head: Normocephalic.     Right Ear: External ear  normal.     Left Ear: External ear normal.     Nose: Nose normal.  Eyes:     General:        Right eye: No discharge.        Left eye: No discharge.     Conjunctiva/sclera: Conjunctivae normal.     Pupils: Pupils are equal, round, and reactive to light.  Neck:     Thyroid: No thyromegaly.     Vascular: No JVD.     Trachea: No tracheal deviation.  Cardiovascular:     Rate and Rhythm: Normal rate and regular rhythm.     Heart sounds: Normal heart sounds.  Pulmonary:     Effort: No respiratory distress.     Breath sounds: No stridor. No wheezing.  Abdominal:     General: Bowel sounds are normal. There is no distension.     Palpations: Abdomen is soft. There is no mass.     Tenderness: There is no abdominal tenderness. There is no guarding or rebound.   Musculoskeletal:        General: No tenderness.     Cervical back: Normal range of motion and neck supple. No rigidity.  Lymphadenopathy:     Cervical: No cervical adenopathy.  Skin:    Findings: No erythema or rash.  Neurological:     Cranial Nerves: No cranial nerve deficit.     Motor: No abnormal muscle tone.     Coordination: Coordination normal.     Deep Tendon Reflexes: Reflexes normal.  Psychiatric:        Behavior: Behavior normal.        Thought Content: Thought content normal.        Judgment: Judgment normal.    Lab Results  Component Value Date   WBC 4.1 01/25/2021   HGB 12.8 01/25/2021   HCT 37.7 01/25/2021   PLT 180.0 01/25/2021   GLUCOSE 112 (H) 01/25/2021   CHOL 152 01/25/2021   TRIG 195.0 (H) 01/25/2021   HDL 43.70 01/25/2021   LDLCALC 69 01/25/2021   ALT 19 01/25/2021   AST 18 01/25/2021   NA 139 01/25/2021   K 3.6 01/25/2021   CL 104 01/25/2021   CREATININE 0.79 01/25/2021   BUN 14 01/25/2021   CO2 30 01/25/2021   TSH 0.75 01/25/2021   INR 0.96 05/09/2011   HGBA1C 5.8 (H) 01/23/2017    VAS [place] LOWER EXTREMITY VENOUS (DVT)  Result Date: 06/14/2021  Lower Venous Reflux Study Patient Name:  NETANYA YAZDANI Towles  Date of Exam:   06/14/2021 Medical Rec #: [id number]      Accession #:    [id number] Date of Birth: [birth year]       Patient Gender: F Patient Age:   14 years Exam Location:  [facility] Procedure:      VAS [place] LOWER EXTREMITY VENOUS REFLUX Referring Phys: Marzetta Board BURNS --------------------------------------------------------------------------------  Indications: Left lower extremity pain and swelling. Study ordered as a rule out DVT exam which was performed in conjunction with a left leg reflux exam.  Risk Factors: Significant varicosities of the left lower extremity. Performing Technologist: Ronal Fear RVS, RCS  Examination Guidelines: A complete evaluation includes B-mode imaging, spectral Doppler, color Doppler, and power Doppler as  needed of all accessible portions of each vessel. Bilateral testing is considered an integral part of a complete examination. Limited examinations for reoccurring indications may be performed as noted. The reflux portion of the exam is performed with the patient in reverse Trendelenburg.  Significant venous reflux is defined as >500 ms in the superficial venous system, and >1 second in the deep venous system.  +--------------+---------+------+-----------+------------+--------+  LEFT           Reflux No Reflux Reflux Time Diameter cms Comments                             Yes                                      +--------------+---------+------+-----------+------------+--------+  CFV                       yes    >1 second                         +--------------+---------+------+-----------+------------+--------+  FV mid                    yes    >1 second                         +--------------+---------+------+-----------+------------+--------+  Popliteal      no                                                  +--------------+---------+------+-----------+------------+--------+  GSV at SFJ                yes     >500 ms       1.25               +--------------+---------+------+-----------+------------+--------+  GSV prox thigh            yes     >500 ms       0.69               +--------------+---------+------+-----------+------------+--------+  GSV mid thigh             yes     >500 ms       0.72               +--------------+---------+------+-----------+------------+--------+  GSV dist thigh            yes     >500 ms       0.68               +--------------+---------+------+-----------+------------+--------+  GSV at knee               yes     >500 ms       0.65               +--------------+---------+------+-----------+------------+--------+  GSV prox calf             yes     >500 ms       0.70               +--------------+---------+------+-----------+------------+--------+  SSV Pop Fossa  no                                0.19               +--------------+---------+------+-----------+------------+--------+  Findings attempted to be reported to Dr. Billey Gosling office at 11:25 am. No answer. Results will be routed in epic.  Summary: Left: - No evidence of deep vein thrombosis from the common femoral through the popliteal veins. - No evidence of superficial venous thrombosis. - The deep venous system is not competent. - The great saphenous vein is not competent. - The small saphenous vein is competent.  *See table(s) above for measurements and observations. Electronically signed by Jamelle Haring on 06/14/2021 at 1:34:39 PM.    Final     Assessment & Plan:   Problem List Items Addressed This Visit     Edema    No relapse      Hyperglycemia    Check A1c      Relevant Orders   Hemoglobin A1c   Hypertensive disorder    Refractory Cont w/candesartan and spironolactone in am Add Bystolic at hs start with 1/2 a day Use Catapress prn      Relevant Medications   nebivolol (BYSTOLIC) 10 MG tablet   Other Relevant Orders   Comprehensive metabolic panel   Ambulatory referral to Cardiology      Meds ordered this encounter  Medications   nebivolol (BYSTOLIC) 10 MG tablet    Sig: Take 1 tablet (10 mg total) by mouth daily.    Dispense:  30 tablet    Refill:  11      Follow-up: Return in about 6 weeks (around 11/20/2021) for a follow-up visit.  Walker Kehr, MD

## 2021-11-07 ENCOUNTER — Ambulatory Visit (HOSPITAL_BASED_OUTPATIENT_CLINIC_OR_DEPARTMENT_OTHER): Payer: Medicare Other | Admitting: Cardiovascular Disease

## 2021-11-07 ENCOUNTER — Other Ambulatory Visit: Payer: Self-pay

## 2021-11-07 ENCOUNTER — Encounter (HOSPITAL_BASED_OUTPATIENT_CLINIC_OR_DEPARTMENT_OTHER): Payer: Self-pay | Admitting: Cardiovascular Disease

## 2021-11-07 DIAGNOSIS — I700 Atherosclerosis of aorta: Secondary | ICD-10-CM

## 2021-11-07 DIAGNOSIS — I10 Essential (primary) hypertension: Secondary | ICD-10-CM

## 2021-11-07 HISTORY — DX: Atherosclerosis of aorta: I70.0

## 2021-11-07 MED ORDER — HYDROCHLOROTHIAZIDE 12.5 MG PO TABS: 12.5000 mg | ORAL_TABLET | Freq: Every day | ORAL | 3 refills | Status: DC

## 2021-11-07 MED ORDER — SPIRONOLACTONE 50 MG PO TABS: 50.0000 mg | ORAL_TABLET | Freq: Every day | ORAL | 3 refills | Status: DC

## 2021-11-07 NOTE — Assessment & Plan Note (Signed)
Noted on abdominal CT in 2021.  Lipids are well controlled.

## 2021-11-07 NOTE — Progress Notes (Signed)
Advanced [facility]pertension Clinic Initial Assessment:    Date:  11/07/2021   ID:  Kelly Santos, DOB 02-Jan-1946, MRN [id number]  PCP:  Kelly Anger, MD  Cardiologist:  None  Nephrologist:  Referring MD: Kelly Anger, MD   CC: Hypertension  History of Present Illness:    Kelly Santos is a 76 y.o. female with a hx of coronary artery disease, hypertension, hyperthyroidism, GERD, anemia, and glaucoma, here to establish care in the [facility]. She previously had a left heart cath in 2012 with trivial CAD and normal EF. Nuclear stress in 2015 and Echo in 2017 were unremarkable. She was last seen in cardiology by Dr. Marlou Santos on 02/28/2017 and was doing well. Her blood pressure was stable on spironolactone. Kelly Santos saw her PCP 10/09/2021 and her BP was 162/90. She reported readings of 160-170 in the AM and 130-146 in the PM at home. She was instructed to continue with candesartan and spironolactone in the morning, add 10 mg bystolic at hs starting with 1/2 a day, and use catapress as needed. She was referred to the Advanced Hypertension clinic.  Today, she is feeling good overall. However, she has developed a right-sided pain in her shoulder that sometimes radiates to her right neck or RUE. She thinks her pain is likely musculoskeletal. It is generally constant, but not severe. Her pain feels deep, and exacerbates with movement. Sometimes it occurs while walking, and at other times she wakes up due to the pain. Lately her blood pressure has been fluctuating but is more consistently elevated such as 164/96, highest 170. This became more noticeable a few months ago, but has been ongoing for a longer time. She has been on antihypertensives for a while, which she believes were more effective until recently. Currently she is not taking clonidine as she was told to take it if her blood pressure was 170/110. Additionally, she endorses some acid reflux. Of note, she underwent esophageal  dilation last year. She was walking for exercise frequently until the cold weather arrived. Typically she walks anywhere from 5k-11k steps. She plans to increase her exercise, including going to the [facility] in [place]. Most of the time she cooks meals at home and monitors her salt intake. Her diet may include blended fruits, or carrots and celery. She does not drink coffee or alcohol. She does not smoke. Usually she may take Ibuprofen for pain management. She takes Vitamin D three times a week, and iron supplements. Her daughter reported that she snores, and she believes she has noticed herself snoring a few times. Usually she feels well rested if she makes sure to get sufficient amounts of sleep. Previously she would wake up every few hours, but lately this seems to have improved. She denies any palpitations, or shortness of breath. No lightheadedness, headaches, syncope, orthopnea, PND, or lower extremity edema.  Previous antihypertensives: Amlodipine-edema HCTZ-hypokalemia Metoprolol-fatigue Carvedilol Clonidine (rarely used)  Past Medical History:  Diagnosis Date   Anemia    Aortic atherosclerosis ([place]) 11/07/2021   CAD (coronary artery disease)    no cardiologist, followed by PCP only   Cataract    bil cataracts removed   Diverticulosis    Generalized headaches    GERD (gastroesophageal reflux disease)    Glaucoma    Hemorrhoids    Hiatal hernia    On CT done Jan 31, 2011    Hx of colonic polyps    Hypertension    Hyperthyroidism    Osteoarthritis  Uterine polyp    Vitamin D deficiency     Past Surgical History:  Procedure Laterality Date   ABDOMINAL HYSTERECTOMY N/A 04/26/2013   Procedure: HYSTERECTOMY ABDOMINAL;  Surgeon: Cyril Mourning, MD;  Location: [facility];  Service: Gynecology;  Laterality: N/A;   COLONOSCOPY     DILATION AND CURETTAGE OF UTERUS  01/18/2013   with uterine polypectomy   SALPINGOOPHORECTOMY Bilateral 04/26/2013   Procedure: SALPINGO OOPHORECTOMY;   Surgeon: Cyril Mourning, MD;  Location: [facility];  Service: Gynecology;  Laterality: Bilateral;   UPPER GASTROINTESTINAL ENDOSCOPY      Current Medications: Current Meds  Medication Sig   amitriptyline (ELAVIL) 25 MG tablet Take 1 tablet (25 mg total) by mouth at bedtime as needed for sleep. Due for routine OV   brimonidine (ALPHAGAN) 0.2 % ophthalmic solution SMARTSIG:1 Drop(s) In Eye(s) Every 12 Hours   candesartan (ATACAND) 32 MG tablet Take 1 tablet (32 mg total) by mouth daily.   cholecalciferol (VITAMIN D) 1000 UNITS tablet Take 1,000 Units by mouth every other day.    COMBIGAN 0.2-0.5 % ophthalmic solution INSTILL 1 DROP INTO BOTH EYES TWICE A DAY   Ferrous Sulfate (IRON) 325 (65 FE) MG TABS Take 1 tablet by mouth 3 (three) times a week.    hydrochlorothiazide (HYDRODIURIL) 12.5 MG tablet Take 1 tablet (12.5 mg total) by mouth daily.   hydrocortisone (PROCTOCORT) 1 % CREA Apply 1 application topically 2 (two) times daily as needed (dry skin).    loratadine (CLARITIN) 10 MG tablet Take 1 tablet (10 mg total) by mouth daily.   nebivolol (BYSTOLIC) 10 MG tablet Take 1 tablet (10 mg total) by mouth daily.   pantoprazole (PROTONIX) 40 MG tablet TAKE 1 TABLET BY MOUTH EVERY DAY   spironolactone (ALDACTONE) 25 MG tablet Take 1 tablet (25 mg total) by mouth daily.   timolol (TIMOPTIC) 0.5 % ophthalmic solution 1 drop every morning.   triamcinolone cream (KENALOG) 0.5 % APPLY TOPICALLY 2 (TWO) TIMES DAILY AS NEEDED.   [DISCONTINUED] cloNIDine (CATAPRES) 0.1 MG tablet Take 1 tablet (0.1 mg total) by mouth 2 (two) times daily as needed. For systolic [id number]     Allergies:   Amlodipine, Hctz [hydrochlorothiazide], and Metoprolol   Social History   Socioeconomic History   Marital status: Divorced    Spouse name: n/a   Number of children: 3   Years of education: Not on file   Highest education level: Not on file  Occupational History   Occupation: CNA    Employer: [employer]     Comment: [employer]  Tobacco Use   Smoking status: Never   Smokeless tobacco: Never  Vaping Use   Vaping Use: Never used  Substance and Sexual Activity   Alcohol use: No    Alcohol/week: 0.0 standard drinks   Drug use: No   Sexual activity: Yes  Other Topics Concern   Not on file  Social History Narrative   Lives with her daughter and granddaughter.   Social Determinants of Health   Financial Resource Strain: Not on file  Food Insecurity: Not on file  Transportation Needs: Not on file  Physical Activity: Not on file  Stress: Not on file  Social Connections: Not on file     Family History: The patient's family history includes CVA in her brother; Deep vein thrombosis (age of onset: 57) in her son; Diabetes in her brother; Heart failure in her father; Hypertension in her brother, father, mother, and sister. There is  no history of Colon cancer, Esophageal cancer, Stomach cancer, or Rectal cancer.  ROS:   Please see the history of present illness.    (+) Right-sided pain of neck, shoulder, and RUE. (+) Acid reflux (+) Snoring All other systems reviewed and are negative.  EKGs/Labs/Other Studies Reviewed:    LLE Venous Doppler 06/14/2021: Summary:  Left:  - No evidence of deep vein thrombosis from the common femoral through the  popliteal veins.  - No evidence of superficial venous thrombosis.  - The deep venous system is not competent.  - The great saphenous vein is not competent.  - The small saphenous vein is competent.   CTA Chest/abdomen/pelvis 06/06/2020: Vascular/Lymphatic: Atherosclerotic calcification is present within the non-aneurysmal abdominal aorta, without hemodynamically significant stenosis. There is a large, partially visualized left great saphenous vein. This appears relatively stable since 2019.   IMPRESSION: 1. No acute thoracic, abdominal or pelvic pathology. Specifically, no pulmonary embolus. 2. Small hiatal hernia. 3. Colonic  diverticulosis without CT evidence for diverticulitis.   Aortic Atherosclerosis (ICD10-I70.0).  Exercise Myoview 02/10/2017: Nuclear stress EF: 64%. Normal wall motion Downsloping ST segment depression ST segment depression of 2 mm was noted during stress in the II, III, aVF and V5 leads. This is a low risk perfusion study. Normal perfusion pattern would suggest false positive exercise treadmill test results. There was no transient ischemic dilatation. Similar findings were noted on prior nuclear stress test 2015.  EKG:   11/07/2021: Sinus bradycardia. Rate 58 bpm. 1st degree AV block.  Recent Labs: 01/25/2021: Hemoglobin 12.8; Platelets 180.0; TSH 0.75 10/09/2021: ALT 16; BUN 9; Creatinine, Ser 0.74; Potassium 3.7; Sodium 138   Recent Lipid Panel    Component Value Date/Time   CHOL 152 01/25/2021 1146   CHOL 156 01/23/2017 1201   TRIG 195.0 (H) 01/25/2021 1146   HDL 43.70 01/25/2021 1146   HDL 55 01/23/2017 1201   CHOLHDL 3 01/25/2021 1146   VLDL 39.0 01/25/2021 1146   LDLCALC 69 01/25/2021 1146   LDLCALC 84 01/23/2017 1201    Physical Exam:    VS:  BP (!) 172/96 (BP Location: Right Arm, Patient Position: Sitting, Cuff Size: Normal)    Pulse (!) 58    Ht 5\' 3"  (1.6 m)    Wt 172 lb 9.6 oz (78.3 kg)    BMI 30.57 kg/m  , BMI Body mass index is 30.57 kg/m. GENERAL:  Well appearing HEENT: Pupils equal round and reactive, fundi not visualized, oral mucosa unremarkable NECK:  No jugular venous distention, waveform within normal limits, carotid upstroke brisk and symmetric, no bruits, no thyromegaly LUNGS:  Clear to auscultation bilaterally HEART:  RRR.  PMI not displaced or sustained,S1 and S2 within normal limits, no S3, no S4, no clicks, no rubs, no murmurs ABD:  Flat, positive bowel sounds normal in frequency in pitch, no bruits, no rebound, no guarding, no midline pulsatile mass, no hepatomegaly, no splenomegaly EXT:  2 plus pulses throughout, no edema, no cyanosis no  clubbing SKIN:  No rashes no nodules NEURO:  Cranial nerves II through XII grossly intact, motor grossly intact throughout PSYCH:  Cognitively intact, oriented to person place and time   ASSESSMENT/PLAN:    Hypertension, uncontrolled Blood pressure is poorly controlled.  It is also been somewhat labile.  She did not tolerate hydrochlorothiazide in the past due to hypokalemia.  At the time she was not on spironolactone.  We will increase her spironolactone to 50 mg and add HCTZ 12.5 mg.  She has  not tolerated amlodipine due to edema.  Continue candesartan and nebivolol.  She is on this because she had fatigue on metoprolol.  We did discuss importance of increasing her exercise to at least 150 minutes and continue to limit sodium intake.  She did have a CT-A in 2021 that showed patent renal arteries and no adrenal adenomas.  I am unable to test for hyperaldosteronism at this time, because we cannot hold both her spironolactone and candesartan to safely do it.  Given the labile nature of her blood pressure it is reasonable to check for pheochromocytoma with catecholamines and metanephrines.  She very rarely takes clonidine so I do not think this is contributing to her labile blood pressures.  We will check renal artery Doppler since it seems that her blood pressure has been more difficult to control lately.  She is not interested in our remote patient monitoring study because she does not think she is tech savvy enough to do it well.  Aortic atherosclerosis ([place]) Noted on abdominal CT in 2021.  Lipids are well controlled.   Screening for Secondary Hypertension:  Causes 11/07/2021  Drugs/Herbals Screened     - Comments limiting sodium intake, rare caffeine, no EtOH, rare NSAIDs  Renovascular HTN Screened     - Comments Check renal artery Dopplers  Sleep Apnea Screened     - Comments snoring but no other symptoms  Thyroid Disease Screened  Hyperaldosteronism Screened     - Comments No adenomas on  CT in 2021  Pheochromocytoma Screened     - Comments Check catecholamines metanephrines  Cushing's Syndrome N/A  Hyperparathyroidism Screened  Coarctation of the Aorta Screened     - Comments BP symmetric  Compliance Screened     - Comments Verified with pharmacy    Relevant Labs/Studies: Basic Labs Latest Ref Rng & Units 10/09/2021 01/25/2021 09/30/2020  Sodium 135 - 145 mEq/L 138 139 139  Potassium 3.5 - 5.1 mEq/L 3.7 3.6 3.6  Creatinine 0.40 - 1.20 mg/dL 0.74 0.79 0.88    Thyroid  Latest Ref Rng & Units 01/25/2021 01/26/2020  TSH 0.35 - 4.50 uIU/mL 0.75 0.65    Renin/Aldosterone  Latest Ref Rng & Units 01/23/2017  Aldosterone 0.0 - 30.0 ng/dL 20.4  Renin 0.167 - 5.380 ng/mL/hr <0.167(L)  Aldos/Renin Ratio 0.0 - 30.0 >122.2(H)       Cortisol Latest Ref Rng & Units 11/02/2015  Cortisol  ug/dL 6.8      Disposition:    FU with APP/PharmD in 1 month for the next 3 months.   FU with Shiasia Porro C. Oval Linsey, MD, [facility] in 4 months.   Medication Adjustments/Labs and Tests Ordered: Current medicines are reviewed at length with the patient today.  Concerns regarding medicines are outlined above.   Orders Placed This Encounter  Procedures   Catecholamines, fractionated, plasma   Metanephrines, plasma   Basic metabolic panel   VAS US RENAL ARTERY DUPLEX   Meds ordered this encounter  Medications   hydrochlorothiazide (HYDRODIURIL) 12.5 MG tablet    Sig: Take 1 tablet (12.5 mg total) by mouth daily.    Dispense:  45 tablet    Refill:  3   I,Mathew Stumpf,acting as a scribe for Skeet Latch, MD.,have documented all relevant documentation on the behalf of Skeet Latch, MD,as directed by  Skeet Latch, MD while in the presence of Skeet Latch, MD.  I, Donnelly Oval Linsey, MD have reviewed all documentation for this visit.  The documentation of the exam, diagnosis, procedures, and  orders on 11/07/2021 are all accurate and complete.   Signed, Skeet Latch, MD  11/07/2021  9:07 AM    [place]

## 2021-11-07 NOTE — Addendum Note (Signed)
Addended by: Alvina Filbert B on: 11/07/2021 02:45 PM   Modules accepted: Orders

## 2021-11-07 NOTE — Patient Instructions (Addendum)
Medication Instructions: INCREASE YOUR SPIROLACTONE TO 50 MG DAILY   START HYDROCHLOROTHIAZIDE 12.5 MG DAILY    Labwork: LABS IN 1 WEEK, NEED TO GO FIRST THING IN THE MORNING    Testing/Procedures:  Your physician has requested that you have a renal artery duplex. During this test, an ultrasound is used to evaluate blood flow to the kidneys. Allow one hour for this exam. Do not eat after midnight the day before and avoid carbonated beverages. Take your medications as you usually do.  Follow-Up: 1 MONTH WITH PHARM D    Special Instructions:   MONITOR YOUR BLOOD PRESSURE TWICE A DAY, LOG IN BOOK PROVIDED. BRING BOOK AND MACHINE TO FOLLOW UP IN 1 MONTH   Exercise recommendations: The American Heart Association recommends 150 minutes of moderate intensity exercise weekly. Try 30 minutes of moderate intensity exercise 4-5 times per week. This could include walking, jogging, or swimming.  DASH Eating Plan DASH stands for "Dietary Approaches to Stop Hypertension." The DASH eating plan is a healthy eating plan that has been shown to reduce high blood pressure (hypertension). It may also reduce your risk for type 2 diabetes, heart disease, and stroke. The DASH eating plan may also help with weight loss. What are tips for following this plan?  General guidelines Avoid eating more than 2,300 mg (milligrams) of salt (sodium) a day. If you have hypertension, you may need to reduce your sodium intake to 1,500 mg a day. Limit alcohol intake to no more than 1 drink a day for nonpregnant women and 2 drinks a day for men. One drink equals 12 oz of beer, 5 oz of wine, or 1 oz of hard liquor. Work with your health care provider to maintain a healthy body weight or to lose weight. Ask what an ideal weight is for you. Get at least 30 minutes of exercise that causes your heart to beat faster (aerobic exercise) most days of the week. Activities may include walking, swimming, or biking. Work with your  health care provider or diet and nutrition specialist (dietitian) to adjust your eating plan to your individual calorie needs. Reading food labels  Check food labels for the amount of sodium per serving. Choose foods with less than 5 percent of the Daily Value of sodium. Generally, foods with less than 300 mg of sodium per serving fit into this eating plan. To find whole grains, look for the word "whole" as the first word in the ingredient list. Shopping Buy products labeled as "low-sodium" or "no salt added." Buy fresh foods. Avoid canned foods and premade or frozen meals. Cooking Avoid adding salt when cooking. Use salt-free seasonings or herbs instead of table salt or sea salt. Check with your health care provider or pharmacist before using salt substitutes. Do not fry foods. Cook foods using healthy methods such as baking, boiling, grilling, and broiling instead. Cook with heart-healthy oils, such as olive, canola, soybean, or sunflower oil. Meal planning Eat a balanced diet that includes: 5 or more servings of fruits and vegetables each day. At each meal, try to fill half of your plate with fruits and vegetables. Up to 6-8 servings of whole grains each day. Less than 6 oz of lean meat, poultry, or fish each day. A 3-oz serving of meat is about the same size as a deck of cards. One egg equals 1 oz. 2 servings of low-fat dairy each day. A serving of nuts, seeds, or beans 5 times each week. Heart-healthy fats. Healthy fats called Omega-3  fatty acids are found in foods such as flaxseeds and coldwater fish, like sardines, salmon, and mackerel. Limit how much you eat of the following: Canned or prepackaged foods. Food that is high in trans fat, such as fried foods. Food that is high in saturated fat, such as fatty meat. Sweets, desserts, sugary drinks, and other foods with added sugar. Full-fat dairy products. Do not salt foods before eating. Try to eat at least 2 vegetarian meals each  week. Eat more home-cooked food and less restaurant, buffet, and fast food. When eating at a restaurant, ask that your food be prepared with less salt or no salt, if possible. What foods are recommended? The items listed may not be a complete list. Talk with your dietitian about what dietary choices are best for you. Grains Whole-grain or whole-wheat bread. Whole-grain or whole-wheat pasta. Brown rice. [place]. Bulgur. Whole-grain and low-sodium cereals. Pita bread. Low-fat, low-sodium crackers. Whole-wheat flour tortillas. Vegetables Fresh or frozen vegetables (raw, steamed, roasted, or grilled). Low-sodium or reduced-sodium tomato and vegetable juice. Low-sodium or reduced-sodium tomato sauce and tomato paste. Low-sodium or reduced-sodium canned vegetables. Fruits All fresh, dried, or frozen fruit. Canned fruit in natural juice (without added sugar). Meat and other protein foods Skinless chicken or [place]. Ground chicken or [place]. Pork with fat trimmed off. Fish and seafood. Egg whites. Dried beans, peas, or lentils. Unsalted nuts, nut butters, and seeds. Unsalted canned beans. Lean cuts of beef with fat trimmed off. Low-sodium, lean deli meat. Dairy Low-fat (1%) or fat-free (skim) milk. Fat-free, low-fat, or reduced-fat cheeses. Nonfat, low-sodium ricotta or cottage cheese. Low-fat or nonfat yogurt. Low-fat, low-sodium cheese. Fats and oils Soft margarine without trans fats. Vegetable oil. Low-fat, reduced-fat, or light mayonnaise and salad dressings (reduced-sodium). Canola, safflower, olive, soybean, and sunflower oils. Avocado. Seasoning and other foods Herbs. Spices. Seasoning mixes without salt. Unsalted popcorn and pretzels. Fat-free sweets. What foods are not recommended? The items listed may not be a complete list. Talk with your dietitian about what dietary choices are best for you. Grains Baked goods made with fat, such as croissants, muffins, or some breads. Dry pasta or  rice meal packs. Vegetables Creamed or fried vegetables. Vegetables in a cheese sauce. Regular canned vegetables (not low-sodium or reduced-sodium). Regular canned tomato sauce and paste (not low-sodium or reduced-sodium). Regular tomato and vegetable juice (not low-sodium or reduced-sodium). Angie Fava. Olives. Fruits Canned fruit in a light or heavy syrup. Fried fruit. Fruit in cream or butter sauce. Meat and other protein foods Fatty cuts of meat. Ribs. Fried meat. Berniece Salines. Sausage. Bologna and other processed lunch meats. Salami. Fatback. Hotdogs. Bratwurst. Salted nuts and seeds. Canned beans with added salt. Canned or smoked fish. Whole eggs or egg yolks. Chicken or [place] with skin. Dairy Whole or 2% milk, cream, and half-and-half. Whole or full-fat cream cheese. Whole-fat or sweetened yogurt. Full-fat cheese. Nondairy creamers. Whipped toppings. Processed cheese and cheese spreads. Fats and oils Butter. Stick margarine. Lard. Shortening. Ghee. Bacon fat. Tropical oils, such as coconut, palm kernel, or palm oil. Seasoning and other foods Salted popcorn and pretzels. Onion salt, garlic salt, seasoned salt, table salt, and sea salt. Worcestershire sauce. Tartar sauce. Barbecue sauce. Teriyaki sauce. Soy sauce, including reduced-sodium. Steak sauce. Canned and packaged gravies. Fish sauce. Oyster sauce. Cocktail sauce. Horseradish that you find on the shelf. Ketchup. Mustard. Meat flavorings and tenderizers. Bouillon cubes. Hot sauce and Tabasco sauce. Premade or packaged marinades. Premade or packaged taco seasonings. Relishes. Regular salad dressings. Where to find  more information: National Heart, Lung, and Blood Institute: https://wilson-eaton.com/ American Heart Association: www.heart.org Summary The DASH eating plan is a healthy eating plan that has been shown to reduce high blood pressure (hypertension). It may also reduce your risk for type 2 diabetes, heart disease, and stroke. With the DASH eating  plan, you should limit salt (sodium) intake to 2,300 mg a day. If you have hypertension, you may need to reduce your sodium intake to 1,500 mg a day. When on the DASH eating plan, aim to eat more fresh fruits and vegetables, whole grains, lean proteins, low-fat dairy, and heart-healthy fats. Work with your health care provider or diet and nutrition specialist (dietitian) to adjust your eating plan to your individual calorie needs. This information is not intended to replace advice given to you by your health care provider. Make sure you discuss any questions you have with your health care provider. Document Released: 09/05/2011 Document Revised: 08/29/2017 Document Reviewed: 09/09/2016 Elsevier Patient Education  2020 [employer].

## 2021-11-07 NOTE — Assessment & Plan Note (Signed)
Blood pressure is poorly controlled.  It is also been somewhat labile.  She did not tolerate hydrochlorothiazide in the past due to hypokalemia.  At the time she was not on spironolactone.  We will increase her spironolactone to 50 mg and add HCTZ 12.5 mg.  She has not tolerated amlodipine due to edema.  Continue candesartan and nebivolol.  She is on this because she had fatigue on metoprolol.  We did discuss importance of increasing her exercise to at least 150 minutes and continue to limit sodium intake.  She did have a CT-A in 2021 that showed patent renal arteries and no adrenal adenomas.  I am unable to test for hyperaldosteronism at this time, because we cannot hold both her spironolactone and candesartan to safely do it.  Given the labile nature of her blood pressure it is reasonable to check for pheochromocytoma with catecholamines and metanephrines.  She very rarely takes clonidine so I do not think this is contributing to her labile blood pressures.  We will check renal artery Doppler since it seems that her blood pressure has been more difficult to control lately.  She is not interested in our remote patient monitoring study because she does not think she is tech savvy enough to do it well.

## 2021-11-12 ENCOUNTER — Telehealth (HOSPITAL_BASED_OUTPATIENT_CLINIC_OR_DEPARTMENT_OTHER): Payer: Self-pay | Admitting: Pharmacist Clinician (PhC)/ Clinical Pharmacy Specialist

## 2021-11-12 NOTE — Telephone Encounter (Signed)
Spoke with patient regarding the rescheduling of her 12/19/21 10:00 am Pharm D appointment---rescheduled to Wednesday 12/26/21 at 10:00 am.  Will mail information to patient and she voiced her understanding.

## 2021-11-13 ENCOUNTER — Telehealth: Payer: Self-pay | Admitting: Cardiovascular Disease

## 2021-11-13 DIAGNOSIS — I10 Essential (primary) hypertension: Secondary | ICD-10-CM | POA: Diagnosis not present

## 2021-11-13 NOTE — Telephone Encounter (Signed)
Please advise 

## 2021-11-13 NOTE — Telephone Encounter (Signed)
Pt is needing to take gas-x for her upcoming procedure and can only find the soft gels and not the capsule.. please advise if these are ok to get

## 2021-11-13 NOTE — Telephone Encounter (Signed)
Called pt to confirm appt for renal doppler. She asked about the gas x. I spoke to Rowan, our ultrasound technician who said it would be ok to not take the gas x. She stated to fast after midnight, but ok to have water with morning medications. Pt verbalized understanding and thanks.

## 2021-11-14 ENCOUNTER — Ambulatory Visit (INDEPENDENT_AMBULATORY_CARE_PROVIDER_SITE_OTHER): Payer: Medicare Other

## 2021-11-14 ENCOUNTER — Other Ambulatory Visit: Payer: Self-pay

## 2021-11-14 DIAGNOSIS — I10 Essential (primary) hypertension: Secondary | ICD-10-CM

## 2021-11-21 ENCOUNTER — Telehealth (HOSPITAL_BASED_OUTPATIENT_CLINIC_OR_DEPARTMENT_OTHER): Payer: Self-pay | Admitting: Cardiovascular Disease

## 2021-11-21 LAB — BASIC METABOLIC PANEL
BUN/Creatinine Ratio: 14 (ref 12–28)
BUN: 14 mg/dL (ref 8–27)
CO2: 22 mmol/L (ref 20–29)
Calcium: 10.2 mg/dL (ref 8.7–10.3)
Chloride: 105 mmol/L (ref 96–106)
Creatinine, Ser: 0.97 mg/dL (ref 0.57–1.00)
Glucose: 128 mg/dL — ABNORMAL HIGH (ref 70–99)
Potassium: 4.3 mmol/L (ref 3.5–5.2)
Sodium: 142 mmol/L (ref 134–144)
eGFR: 61 mL/min/{1.73_m2} (ref >59)

## 2021-11-21 LAB — CATECHOLAMINES, FRACTIONATED, PLASMA
Dopamine: <30 pg/mL (ref 0–48)
Epinephrine: 39 pg/mL (ref 0–62)
Norepinephrine: 581 pg/mL (ref 0–874)

## 2021-11-21 LAB — METANEPHRINES, PLASMA
Metanephrine, Free: 34.4 pg/mL (ref 0.0–88.0)
Normetanephrine, Free: 60.9 pg/mL (ref 0.0–285.2)

## 2021-11-21 NOTE — Telephone Encounter (Signed)
Patient wanted to have DR. Shia[place]see's RN go over the results of the Renal test with her

## 2021-11-21 NOTE — Telephone Encounter (Signed)
Results called to patient who verbalizes understanding!       Skeet Latch, MD  11/18/2021  8:43 PM EST   Normal flow to both kidneys.

## 2021-12-18 ENCOUNTER — Other Ambulatory Visit (HOSPITAL_BASED_OUTPATIENT_CLINIC_OR_DEPARTMENT_OTHER): Payer: Self-pay | Admitting: Cardiovascular Disease

## 2021-12-18 DIAGNOSIS — H401122 Primary open-angle glaucoma, left eye, moderate stage: Secondary | ICD-10-CM | POA: Diagnosis not present

## 2021-12-18 DIAGNOSIS — H401111 Primary open-angle glaucoma, right eye, mild stage: Secondary | ICD-10-CM | POA: Diagnosis not present

## 2021-12-19 ENCOUNTER — Ambulatory Visit (HOSPITAL_BASED_OUTPATIENT_CLINIC_OR_DEPARTMENT_OTHER): Payer: Medicare Other | Admitting: Cardiovascular Disease

## 2021-12-19 ENCOUNTER — Ambulatory Visit (HOSPITAL_BASED_OUTPATIENT_CLINIC_OR_DEPARTMENT_OTHER): Payer: Medicare Other

## 2021-12-20 ENCOUNTER — Other Ambulatory Visit: Payer: Self-pay | Admitting: Internal Medicine

## 2021-12-21 ENCOUNTER — Telehealth: Payer: Self-pay

## 2021-12-21 ENCOUNTER — Other Ambulatory Visit: Payer: Self-pay

## 2021-12-21 MED ORDER — AMITRIPTYLINE HCL 25 MG PO TABS: 25.0000 mg | ORAL_TABLET | Freq: Every evening | ORAL | 5 refills | Status: DC | PRN

## 2021-12-21 NOTE — Telephone Encounter (Signed)
Pt is requesting a refill:; ?amitriptyline (ELAVIL) 25 MG tablet ? ?Pharmacy: ?[id number] [address]? ?LOV 10/09/21 ? ?

## 2021-12-26 ENCOUNTER — Other Ambulatory Visit: Payer: Self-pay

## 2021-12-26 ENCOUNTER — Ambulatory Visit (HOSPITAL_BASED_OUTPATIENT_CLINIC_OR_DEPARTMENT_OTHER): Payer: Medicare Other | Admitting: Pharmacist Clinician (PhC)/ Clinical Pharmacy Specialist

## 2021-12-26 ENCOUNTER — Encounter (HOSPITAL_BASED_OUTPATIENT_CLINIC_OR_DEPARTMENT_OTHER): Payer: Self-pay | Admitting: Pharmacist Clinician (PhC)/ Clinical Pharmacy Specialist

## 2021-12-26 DIAGNOSIS — I10 Essential (primary) hypertension: Secondary | ICD-10-CM | POA: Diagnosis not present

## 2021-12-26 NOTE — Assessment & Plan Note (Signed)
Patient with essential hypertension coupled with white coat hypertension.  Her home readings averaged 253'G systolic at home, however in the office today was 152/101.  Home cuff read within 4 points of office reading.  She is doing well with current regimen of candesartan 32 mg, spironolactone 50 mg and hydrochlorothiazide 12.5 mg, all in the mornings.  Praised her exercise routines and encouraged her to keep this up and expand as she is able.  Will have her continue with current medications for now and follow up with Dr Oval Linsey in 6-12 months as necessary.   ?

## 2021-12-26 NOTE — Progress Notes (Signed)
? ? ? ?12/26/2021 ?Kelly Santos ?06/24/1946 ?[id number] ? ? ?HPI:  Kelly Santos is a 76 y.o. female patient of Dr Oval Linsey, with a [place] below who presents today for hypertension clinic evaluation.  She was referred to [facility] by Dr. Alain Marion, and at her first visit with Dr. Oval Linsey had a blood pressure of 172/96.  At that visit Dr. Oval Linsey discontinued clonidine, increased spironolactone to 50 mg daily and added HCTZ 12.5 mg daily.  Patient later then stopped her nebivolol, as she didn't like the way it made her feel.   ? ?Today she returns for follow up.  Has been checking home BP regularly, and brings in a list of readings as well as her home BP meter.  She has no concerns with the 3 medications and no issues with cost or compliance.   ? ?Past Medical History: ?CAD 2012 L heart cath showed trivial CAD; 6/21 Calcium score of 0  ?hyperthyroidism 4/22 TSH 0.75  ?Pre-diabetes 1/23 A1c 6.2  ?  ? ?Blood Pressure Goal:  130/80 ? ?Current Medications: HCTZ 12.5 mg qd, spironolactone 50 mg qd, candesartan 32 mg qd - all in am ? ?Family Hx: unsure about parents health issues;  9 siblings - 3 brothers deceased, 3 living (2 have hypertension) 3 sisters all with hypertension;  3 children - one son with hypertension;  ? ?Social Hx: no tobacco; no alcohol; some tea when eating out ? ?Diet: mostly home cooked; has cut out salt, now using garlic/onion powders; does like cabbage, greens, otherwise not many vegetables; using brown rice ? ?Exercise: walks about 2 miles at least 4 times per week ? ?Home BP readings:  ? AM 29 readings average 126/84  HR 71 ? PM 25 readings average 121/80  HR 73 ? ?Intolerances: hctz - hypokalemia, amlodipine - edema, metoprolol - fatigue ? ?Labs: 11/13/21:  Na 142, K 4.3, Glu 128, BN 14, SCr 0.97, GFR 61 ? ? ?Wt Readings from Last 3 Encounters:  ?12/26/21 167 lb 3.2 oz (75.8 kg)  ?11/07/21 172 lb 9.6 oz (78.3 kg)  ?10/09/21 175 lb 9.6 oz (79.7 kg)  ? ?BP Readings from Last 3 Encounters:  ?12/26/21 (!)  152/101  ?11/07/21 (!) 172/96  ?10/09/21 (!) 162/90  ? ?Pulse Readings from Last 3 Encounters:  ?12/26/21 62  ?11/07/21 (!) 58  ?10/09/21 62  ? ? ?Current Outpatient Medications  ?Medication Sig Dispense Refill  ? amitriptyline (ELAVIL) 25 MG tablet Take 1 tablet (25 mg total) by mouth at bedtime as needed for sleep. Due for routine OV 30 tablet 5  ? brimonidine (ALPHAGAN) 0.2 % ophthalmic solution SMARTSIG:1 Drop(s) In Eye(s) Every 12 Hours    ? candesartan (ATACAND) 32 MG tablet Take 1 tablet (32 mg total) by mouth daily. 90 tablet 3  ? cholecalciferol (VITAMIN D) 1000 UNITS tablet Take 1,000 Units by mouth every other day.     ? COMBIGAN 0.2-0.5 % ophthalmic solution INSTILL 1 DROP INTO BOTH EYES TWICE A DAY 5 mL 2  ? Ferrous Sulfate (IRON) 325 (65 FE) MG TABS Take 1 tablet by mouth 3 (three) times a week.     ? hydrochlorothiazide (HYDRODIURIL) 12.5 MG tablet TAKE 1 TABLET BY MOUTH EVERY DAY 90 tablet 1  ? hydrocortisone (PROCTOCORT) 1 % CREA Apply 1 application topically 2 (two) times daily as needed (dry skin).     ? loratadine (CLARITIN) 10 MG tablet Take 1 tablet (10 mg total) by mouth daily. 30 tablet 11  ? nebivolol (BYSTOLIC)  10 MG tablet Take 1 tablet (10 mg total) by mouth daily. 30 tablet 11  ? pantoprazole (PROTONIX) 40 MG tablet TAKE 1 TABLET BY MOUTH EVERY DAY 90 tablet 3  ? spironolactone (ALDACTONE) 50 MG tablet Take 1 tablet (50 mg total) by mouth daily. 90 tablet 3  ? timolol (TIMOPTIC) 0.5 % ophthalmic solution 1 drop every morning.    ? triamcinolone cream (KENALOG) 0.5 % APPLY TOPICALLY 2 (TWO) TIMES DAILY AS NEEDED. 30 g 0  ? ?No current facility-administered medications for this visit.  ? ? ?Allergies  ?Allergen Reactions  ? Amlodipine   ?  edema  ? Hctz [Hydrochlorothiazide]   ?  Hypokalemia - resolved off HCTZ  ? Metoprolol   ?  fatigue  ? ? ?Past Medical History:  ?Diagnosis Date  ? Anemia   ? Aortic atherosclerosis ([place]) 11/07/2021  ? CAD (coronary artery disease)   ? no cardiologist,  followed by PCP only  ? Cataract   ? bil cataracts removed  ? Diverticulosis   ? Generalized headaches   ? GERD (gastroesophageal reflux disease)   ? Glaucoma   ? Hemorrhoids   ? Hiatal hernia   ? On CT done Jan 31, 2011   ? Hx of colonic polyps   ? Hypertension   ? Hyperthyroidism   ? Osteoarthritis   ? Uterine polyp   ? Vitamin D deficiency   ? ? ?Blood pressure (!) 152/101, pulse 62, height '5\' 3"'$  (1.6 m), weight 167 lb 3.2 oz (75.8 kg). ? ?Hypertension, uncontrolled ?Patient with essential hypertension coupled with white coat hypertension.  Her home readings averaged 867'J systolic at home, however in the office today was 152/101.  Home cuff read within 4 points of office reading.  She is doing well with current regimen of candesartan 32 mg, spironolactone 50 mg and hydrochlorothiazide 12.5 mg, all in the mornings.  Praised her exercise routines and encouraged her to keep this up and expand as she is able.  Will have her continue with current medications for now and follow up with Dr Oval Linsey in 6-12 months as necessary.   ? ? ?Tommy Medal PharmD CPP [facility] ?[address] ?[place], [place] [address] ?[phone] ?

## 2021-12-26 NOTE — Patient Instructions (Signed)
Return for a a follow up appointment with Dr. Oval Linsey in 6-12 months ? ?Check your blood pressure at home 2-3 times per week and keep record of the readings. ? ?Take your BP meds as follows: ? No changes in your medications ? ?Bring all of your meds, your BP cuff and your record of home blood pressures to your next appointment.  Exercise as you?re able, try to walk approximately 30 minutes per day.  Keep salt intake to a minimum, especially watch canned and prepared boxed foods.  Eat more fresh fruits and vegetables and fewer canned items.  Avoid eating in fast food restaurants.  ? ? HOW TO TAKE YOUR BLOOD PRESSURE: ?Rest 5 minutes before taking your blood pressure. ? Don?t smoke or drink caffeinated beverages for at least 30 minutes before. ?Take your blood pressure before (not after) you eat. ?Sit comfortably with your back supported and both feet on the floor (don?t cross your legs). ?Elevate your arm to heart level on a table or a desk. ?Use the proper sized cuff. It should fit smoothly and snugly around your bare upper arm. There should be enough room to slip a fingertip under the cuff. The bottom edge of the cuff should be 1 inch above the crease of the elbow. ?Ideally, take 3 measurements at one sitting and record the average. ? ? ?

## 2022-02-05 DIAGNOSIS — M8588 Other specified disorders of bone density and structure, other site: Secondary | ICD-10-CM | POA: Diagnosis not present

## 2022-02-05 DIAGNOSIS — Z1231 Encounter for screening mammogram for malignant neoplasm of breast: Secondary | ICD-10-CM | POA: Diagnosis not present

## 2022-03-14 ENCOUNTER — Telehealth: Payer: Self-pay | Admitting: Internal Medicine

## 2022-03-14 NOTE — Telephone Encounter (Signed)
N/A unable to leave a message for patient to call back to schedule Medicare Annual Wellness Visit   No hx of AWV eligible as of 05/31/21  Please schedule at anytime with [facility] Advisor if patient calls the office back.    50 Minutes appointment   Any questions, please call me at [phone]

## 2022-03-15 ENCOUNTER — Ambulatory Visit (INDEPENDENT_AMBULATORY_CARE_PROVIDER_SITE_OTHER): Payer: Medicare Other

## 2022-03-15 DIAGNOSIS — Z0000 Encounter for general adult medical examination without abnormal findings: Secondary | ICD-10-CM

## 2022-03-15 NOTE — Progress Notes (Signed)
Subjective:   Kelly Santos is a 76 y.o. female who presents for an Initial Medicare Annual Wellness Visit.  I connected with Kelly Santos today by telephone and verified that I am speaking with the correct person using two identifiers. I discussed the limitations, risks, security and privacy concerns of performing an evaluation and management service by telephone and the availability of in person appointments. I also discussed with the patient that there may be a patient responsible charge related to this service. The patient expressed understanding and agreed to proceed. Location patient: home Location provider: work  Persons participating in the virtual visit: Ragen Spader and Jillene Bucks, [place].  Review of Systems    No ROS. Medicare Wellness Telephone Visit. Additional risk factors are reflected in social history. Cardiac Risk Factors include: advanced age (>67mn, >>25women)     Objective:    There were no vitals filed for this visit. There is no height or weight on file to calculate BMI.     03/15/2022    9:39 AM 06/06/2020    4:09 PM 03/26/2020   11:26 PM 05/06/2018    1:44 PM 04/07/2017    8:41 PM 09/29/2015    1:09 PM 04/26/2013   11:07 AM  Advanced Directives  Does Patient Have a Medical Advance Directive? No No No No No No Patient does not have advance directive  Would patient like information on creating a medical advance directive? No - Patient declined    No - Patient declined    Pre-existing out of facility DNR order (yellow form or pink MOST form)       No    Current Medications (verified) Outpatient Encounter Medications as of 03/15/2022  Medication Sig   amitriptyline (ELAVIL) 25 MG tablet Take 1 tablet (25 mg total) by mouth at bedtime as needed for sleep. Due for routine OV   brimonidine (ALPHAGAN) 0.2 % ophthalmic solution SMARTSIG:1 Drop(s) In Eye(s) Every 12 Hours   candesartan (ATACAND) 32 MG tablet Take 1 tablet (32 mg total) by mouth daily.    cholecalciferol (VITAMIN D) 1000 UNITS tablet Take 1,000 Units by mouth every other day.    COMBIGAN 0.2-0.5 % ophthalmic solution INSTILL 1 DROP INTO BOTH EYES TWICE A DAY   Ferrous Sulfate (IRON) 325 (65 FE) MG TABS Take 1 tablet by mouth 3 (three) times a week.    hydrochlorothiazide (HYDRODIURIL) 12.5 MG tablet TAKE 1 TABLET BY MOUTH EVERY DAY   hydrocortisone (PROCTOCORT) 1 % CREA Apply 1 application topically 2 (two) times daily as needed (dry skin).    pantoprazole (PROTONIX) 40 MG tablet TAKE 1 TABLET BY MOUTH EVERY DAY   spironolactone (ALDACTONE) 50 MG tablet Take 1 tablet (50 mg total) by mouth daily.   timolol (TIMOPTIC) 0.5 % ophthalmic solution 1 drop every morning.   triamcinolone cream (KENALOG) 0.5 % APPLY TOPICALLY 2 (TWO) TIMES DAILY AS NEEDED.   loratadine (CLARITIN) 10 MG tablet Take 1 tablet (10 mg total) by mouth daily. (Patient not taking: Reported on 03/15/2022)   [DISCONTINUED] nebivolol (BYSTOLIC) 10 MG tablet Take 1 tablet (10 mg total) by mouth daily. (Patient not taking: Reported on 03/15/2022)   No facility-administered encounter medications on file as of 03/15/2022.  Medications reviewed with patient; no opioid use noted.  Allergies (verified) Amlodipine, Hctz [hydrochlorothiazide], and Metoprolol   History: Past Medical History:  Diagnosis Date   Anemia    [place] 11/07/20[place]   CAD (coronary artery disease)    no  cardiologist, followed by PCP only   Cataract    bil cataracts removed   Diverticulosis    Generalized headaches    GERD (gastroesophageal reflux disease)    Glaucoma    Hemorrhoids    Hiatal hernia    On CT done Jan 31, 2011    Hx of colonic polyps    Hypertension    Hyperthyroidism    Osteoarthritis    Uterine polyp    Vitamin D deficiency    Past Surgical History:  Procedure Laterality Date   ABDOMINAL HYSTERECTOMY N/A 04/26/2013   Procedure: HYSTERECTOMY ABDOMINAL;  Surgeon: Cyril Mourning, MD;  Location: [facility];  Service: Gynecology;  Laterality: N/A;   COLONOSCOPY     DILATION AND CURETTAGE OF UTERUS  01/18/2013   with uterine polypectomy   SALPINGOOPHORECTOMY Bilateral 04/26/2013   Procedure: SALPINGO OOPHORECTOMY;  Surgeon: Cyril Mourning, MD;  Location: [facility];  Service: Gynecology;  Laterality: Bilateral;   UPPER GASTROINTESTINAL ENDOSCOPY     Family History  Problem Relation Age of Onset   Hypertension Mother    Hypertension Father    Heart failure Father    Deep vein thrombosis Son 68       x 2, chronic coumadin   Hypertension Sister    CVA Brother    Diabetes Brother    Hypertension Brother    Colon cancer Neg Hx    Esophageal cancer Neg Hx    Stomach cancer Neg Hx    Rectal cancer Neg Hx    Social History   Socioeconomic History   Marital status: Divorced    Spouse name: n/a   Number of children: 3   Years of education: Not on file   Highest education level: Not on file  Occupational History   Occupation: CNA    Employer: [employer]    Comment: [employer]  Tobacco Use   Smoking status: Never   Smokeless tobacco: Never  Vaping Use   Vaping Use: Never used  Substance and Sexual Activity   Alcohol use: No    Alcohol/week: 0.0 standard drinks of alcohol   Drug use: No   Sexual activity: Yes  Other Topics Concern   Not on file  Social History Narrative   Lives with her daughter and granddaughter.   Social Determinants of Health   Financial Resource Strain: Low Risk  (03/15/2022)   Overall Financial Resource Strain (CARDIA)    Difficulty of Paying Living Expenses: Not very hard  Food Insecurity: No Food Insecurity (03/15/2022)   Hunger Vital Sign    Worried About Running Out of Food in the Last Year: Never true    Ran Out of Food in the Last Year: Never true  Transportation Needs: No Transportation Needs (03/15/2022)   PRAPARE - Hydrologist (Medical): No    Lack of Transportation (Non-Medical): No  Physical  Activity: Sufficiently Active (03/15/2022)   Exercise Vital Sign    Days of Exercise per Week: 4 days    Minutes of Exercise per Session: 60 min  Stress: No Stress Concern Present (03/15/2022)   [place]    Feeling of Stress : Not at all  Social Connections: Moderately Isolated (03/15/2022)   Social Connection and Isolation Panel [NHANES]    Frequency of Communication with Friends and Family: More than three times a week    Frequency of Social Gatherings with Friends and Family: Three times a  week    Attends Religious Services: More than 4 times per year    Active Member of Clubs or Organizations: No    Attends Archivist Meetings: Never    Marital Status: Divorced    Tobacco Counseling Counseling given: Not Answered   Clinical Intake:  Pre-visit preparation completed: Yes  Pain : No/denies pain     Nutritional Status: BMI 25 -29 Overweight Nutritional Risks: None Diabetes: No  How often do you need to have someone help you when you read instructions, pamphlets, or other written materials from your doctor or pharmacy?: 1 - Never What is the last grade level you completed in school?: 8th grade  Diabetic? No  Interpreter Needed?: No  Information entered by :: Jillene Bucks, CMA   Activities of Daily Living    03/15/2022    9:41 AM  In your present state of health, do you have any difficulty performing the following activities:  Hearing? 0  Vision? 0  Difficulty concentrating or making decisions? 0  Walking or climbing stairs? 0  Dressing or bathing? 0  Doing errands, shopping? 0  Preparing Food and eating ? N  Using the Toilet? N  In the past six months, have you accidently leaked urine? N  Do you have problems with loss of bowel control? N  Managing your Medications? N  Managing your Finances? N  Housekeeping or managing your Housekeeping? N    Patient Care Team: Plotnikov, Evie Lacks, MD as PCP - General Szabat, Darnelle Maffucci, [facility] (Inactive) as Pharmacist (Pharmacist)  Indicate any recent Medical Services you may have received from other than [facility] providers in the past year (date may be approximate).     Assessment:   This is a routine wellness examination for Kelly Santos.  Hearing/Vision screen Patient denied any hearing difficulty. No hearing aids.  Patient does wear corrective lenses/contacts.   Dietary issues and exercise activities discussed: Current Exercise Habits: Home exercise routine, Type of exercise: walking, Time (Minutes): 60, Frequency (Times/Week): 4, Weekly Exercise (Minutes/Week): 240, Intensity: Mild, Exercise limited by: None identified   Goals Addressed               This Visit's Progress     Patient Stated (pt-stated)        I would like to get my blood pressure down and stay consistent with walking and eating well.        Depression Screen    03/15/2022    9:34 AM 03/09/2021    1:37 PM 01/26/2020   10:53 AM 05/22/2018   10:34 AM 01/14/2017    9:14 AM 04/23/2016   10:30 AM 12/30/2015    9:04 AM  PHQ 2/9 Scores  PHQ - 2 Score 0 0 0 0 0 0 0    Fall Risk    03/15/2022    9:40 AM 03/09/2021    1:37 PM 01/26/2020   10:53 AM 05/22/2018   10:34 AM 01/14/2017    9:14 AM  Fall Risk   Falls in the past year? 0 0 0 No No  Number falls in past yr: 0 0     Injury with Fall? 0 0     Risk for fall due to : No Fall Risks No Fall Risks     Follow up Falls evaluation completed  Falls evaluation completed      [place]:  Any stairs in or around the home? No  If so, are there any without  handrails?  N/A Home free of loose throw rugs in walkways, pet beds, electrical cords, etc? Yes  Adequate lighting in your home to reduce risk of falls? Yes   ASSISTIVE DEVICES UTILIZED TO PREVENT FALLS:  Life alert? Yes she has an Alexa Use of a cane, walker or w/c? No  Grab bars in the bathroom? No  Shower chair or bench  in shower? No  Elevated toilet seat or a handicapped toilet? No   TIMED UP AND GO:  Was the test performed? No this was a phone visit.  Length of time to ambulate 10 feet: N/A sec.   Patient stated that she has no issues with gait or balance; does not use any assistive devices.  Cognitive Function:  Patient is cogitatively intact.      03/15/2022    9:42 AM  6CIT Screen  What Year? 0 points  What month? 0 points  What time? 0 points  Count back from 20 0 points  Months in reverse 0 points  Repeat phrase 2 points  Total Score 2 points    Immunizations Immunization History  Administered Date(s) Administered   PFIZER(Purple Top)SARS-COV-2 Vaccination 09/20/2020, 10/18/2020   Pneumococcal Conjugate-13 01/31/2015   Pneumococcal Polysaccharide-23 04/27/2013   Td 09/21/2010    TDAP status: Due, Education has been provided regarding the importance of this vaccine. Advised may receive this vaccine at local pharmacy or Health Dept. Aware to provide a copy of the vaccination record if obtained from local pharmacy or Health Dept. Verbalized acceptance and understanding.  Flu Vaccine status: Declined, Education has been provided regarding the importance of this vaccine but patient still declined. Advised may receive this vaccine at local pharmacy or Health Dept. Aware to provide a copy of the vaccination record if obtained from local pharmacy or Health Dept. Verbalized acceptance and understanding.  Pneumococcal vaccine status: Up to date  Covid-19 vaccine status: Completed vaccines  Qualifies for Shingles Vaccine? Yes   Zostavax completed No   Shingrix Completed?: No.    Education has been provided regarding the importance of this vaccine. Patient has been advised to call insurance company to determine out of pocket expense if they have not yet received this vaccine. Advised may also receive vaccine at local pharmacy or Health Dept. Verbalized acceptance and  understanding.  Screening Tests Health Maintenance  Topic Date Due   COVID-19 Vaccine (3 - Pfizer series) 03/31/2022 (Originally 12/13/2020)   Zoster Vaccines- Shingrix (1 of 2) 06/15/2022 (Originally 06/04/1996)   TETANUS/TDAP  03/16/2023 (Originally 09/21/2020)   Hepatitis C Screening  03/16/2023 (Originally 06/04/1964)   INFLUENZA VACCINE  04/30/2022   Pneumonia Vaccine 39+ Years old  Completed   DEXA SCAN  Completed   HPV VACCINES  Aged Out   COLONOSCOPY (Pts 45-24yr Insurance coverage will need to be confirmed)  Discontinued    Health Maintenance  There are no preventive care reminders to display for this patient.   Colorectal cancer screening: Type of screening: Colonoscopy. Completed 11/28/2020. Repeat every 0 years  Mammogram status: No longer required due to age.  Bone Density status: Completed 10/03/2017. Results reflect: Bone density results: NORMAL. Repeat every 0 years.  Lung Cancer Screening: (Low Dose CT Chest recommended if Age 76-80years, 30 pack-year currently smoking OR have quit w/in 15years.) does not qualify.   Lung Cancer Screening Referral: N/A  Additional Screening:  Hepatitis C Screening: does qualify; Completed N/A  Vision Screening: Recommended annual ophthalmology exams for early detection of glaucoma and other disorders of the  eye. Is the patient up to date with their annual eye exam?  Yes  Who is the provider or what is the name of the office in which the patient attends annual eye exams? [place] (now called [facility]) If pt is not established with a provider, would they like to be referred to a provider to establish care? No .   Dental Screening: Recommended annual dental exams for proper oral hygiene  Community Resource Referral / Chronic Care Management: CRR required this visit?  No   CCM required this visit?  No      Plan:     I have personally reviewed and noted the following in the patient's chart:   Medical and social  history Use of alcohol, tobacco or illicit drugs  Current medications and supplements including opioid prescriptions. Patient is not currently taking opioid prescriptions. Functional ability and status Nutritional status Physical activity Advanced directives List of other physicians Hospitalizations, surgeries, and ER visits in previous 12 months Vitals Screenings to include cognitive, depression, and falls Referrals and appointments  In addition, I have reviewed and discussed with patient certain preventive protocols, quality metrics, and best practice recommendations. A written personalized care plan for preventive services as well as general preventive health recommendations were provided to patient.     Rossie Muskrat, Petros   03/15/2022   Nurse Notes:   Time Spent with patient on telephone encounter: 21 mins

## 2022-03-15 NOTE — Patient Instructions (Signed)
It was great speaking with you today!!  Please continue to do your personal lifestyle choices by: daily care of teeth and gums, regular physical activity (goal should be 5 days a week for 30 minutes), eat a healthy diet, avoid tobacco and drug use, limiting any alcohol intake, taking a low-dose aspirin (if not allergic or have been advised by your provider otherwise) and taking vitamins and minerals as recommended by your provider. Continue doing brain stimulating activities (puzzles, reading, adult coloring books, staying active) to keep memory sharp. Continue to eat heart healthy diet (full of fruits, vegetables, whole grains, lean protein, water--limit salt, fat, and sugar intake) and increase physical activity as tolerated.   Please schedule your next Medicare Wellness Visit with your Nurse Health Advisor in 1 year by calling [phone].

## 2022-04-03 ENCOUNTER — Encounter: Payer: Self-pay | Admitting: Internal Medicine

## 2022-04-03 ENCOUNTER — Ambulatory Visit (INDEPENDENT_AMBULATORY_CARE_PROVIDER_SITE_OTHER): Payer: Medicare Other | Admitting: Internal Medicine

## 2022-04-03 VITALS — BP 130/82 | HR 62 | Temp 98.4°F | Ht 63.0 in | Wt 164.0 lb

## 2022-04-03 DIAGNOSIS — I10 Essential (primary) hypertension: Secondary | ICD-10-CM | POA: Diagnosis not present

## 2022-04-03 DIAGNOSIS — R739 Hyperglycemia, unspecified: Secondary | ICD-10-CM | POA: Diagnosis not present

## 2022-04-03 DIAGNOSIS — Z0000 Encounter for general adult medical examination without abnormal findings: Secondary | ICD-10-CM

## 2022-04-03 DIAGNOSIS — R202 Paresthesia of skin: Secondary | ICD-10-CM | POA: Diagnosis not present

## 2022-04-03 LAB — CBC WITH DIFFERENTIAL/PLATELET
Basophils Absolute: 0 10*3/uL (ref 0.0–0.1)
Basophils Relative: 1 % (ref 0.0–3.0)
Eosinophils Absolute: 0.2 10*3/uL (ref 0.0–0.7)
Eosinophils Relative: 6.7 % — ABNORMAL HIGH (ref 0.0–5.0)
HCT: 35.6 % — ABNORMAL LOW (ref 36.0–46.0)
Hemoglobin: 12.3 g/dL (ref 12.0–15.0)
Lymphocytes Relative: 29.7 % (ref 12.0–46.0)
Lymphs Abs: 0.9 10*3/uL (ref 0.7–4.0)
MCHC: 34.7 g/dL (ref 30.0–36.0)
MCV: 99.9 fl (ref 78.0–100.0)
Monocytes Absolute: 0.3 10*3/uL (ref 0.1–1.0)
Monocytes Relative: 9.9 % (ref 3.0–12.0)
Neutro Abs: 1.6 10*3/uL (ref 1.4–7.7)
Neutrophils Relative %: 52.7 % (ref 43.0–77.0)
Platelets: 176 10*3/uL (ref 150.0–400.0)
RBC: 3.56 Mil/uL — ABNORMAL LOW (ref 3.87–5.11)
RDW: 11.9 % (ref 11.5–15.5)
WBC: 3 10*3/uL — ABNORMAL LOW (ref 4.0–10.5)

## 2022-04-03 LAB — HEMOGLOBIN A1C
Hgb A1c MFr Bld: 6 % (ref 4.6–6.5)
Hgb A1c MFr Bld: 6 % (ref 4.6–6.5)

## 2022-04-03 LAB — URINALYSIS, ROUTINE W REFLEX MICROSCOPIC
Bilirubin Urine: NEGATIVE
Hgb urine dipstick: NEGATIVE
Ketones, ur: NEGATIVE
Nitrite: NEGATIVE
RBC / HPF: NONE SEEN (ref 0–?)
Specific Gravity, Urine: 1.01 (ref 1.000–1.030)
Total Protein, Urine: NEGATIVE
Urine Glucose: NEGATIVE
Urobilinogen, UA: 0.2 (ref 0.0–1.0)
pH: 6.5 (ref 5.0–8.0)

## 2022-04-03 LAB — COMPREHENSIVE METABOLIC PANEL
ALT: 16 U/L (ref 0–35)
AST: 18 U/L (ref 0–37)
Albumin: 4.2 g/dL (ref 3.5–5.2)
Alkaline Phosphatase: 57 U/L (ref 39–117)
BUN: 14 mg/dL (ref 6–23)
CO2: 28 mEq/L (ref 19–32)
Calcium: 10.2 mg/dL (ref 8.4–10.5)
Chloride: 105 mEq/L (ref 96–112)
Creatinine, Ser: 0.99 mg/dL (ref 0.40–1.20)
GFR: 55.65 mL/min — ABNORMAL LOW (ref >60.00)
Glucose, Bld: 110 mg/dL — ABNORMAL HIGH (ref 70–99)
Potassium: 4.4 mEq/L (ref 3.5–5.1)
Sodium: 139 mEq/L (ref 135–145)
Total Bilirubin: 0.4 mg/dL (ref 0.2–1.2)
Total Protein: 7.5 g/dL (ref 6.0–8.3)

## 2022-04-03 LAB — LIPID PANEL
Cholesterol: 161 mg/dL (ref 0–200)
HDL: 42.3 mg/dL (ref >39.00)
LDL Cholesterol: 99 mg/dL (ref 0–99)
NonHDL: 119.05
Total CHOL/HDL Ratio: 4
Triglycerides: 99 mg/dL (ref 0.0–149.0)
VLDL: 19.8 mg/dL (ref 0.0–40.0)

## 2022-04-03 LAB — MAGNESIUM: Magnesium: 2 mg/dL (ref 1.5–2.5)

## 2022-04-03 LAB — TSH: TSH: 1.09 u[IU]/mL (ref 0.35–5.50)

## 2022-04-03 LAB — VITAMIN B12: Vitamin B-12: 585 pg/mL (ref 211–911)

## 2022-04-03 MED ORDER — CANDESARTAN CILEXETIL 32 MG PO TABS: 32.0000 mg | ORAL_TABLET | Freq: Every day | ORAL | 3 refills | Status: DC

## 2022-04-03 MED ORDER — PANTOPRAZOLE SODIUM 40 MG PO TBEC: 40.0000 mg | DELAYED_RELEASE_TABLET | Freq: Every day | ORAL | 3 refills | Status: DC

## 2022-04-03 NOTE — Assessment & Plan Note (Signed)
  We discussed age appropriate health related issues, including available/recomended screening tests and vaccinations. Labs were ordered to be later reviewed . All questions were answered. We discussed one or more of the following - seat belt use, use of sunscreen/sun exposure exercise, fall risk reduction, second hand smoke exposure, firearm use and storage, seat belt use, a need for adhering to healthy diet and exercise. Labs were ordered.  All questions were answered. Last colon/EGD 2022 - Dr Henrene Pastor, no need to repeat PAP/DEXA/mammo with Dr Helane Rima

## 2022-04-03 NOTE — Progress Notes (Signed)
Subjective:  Patient ID: Kelly Santos DATE, female    DOB: [birth year]  Age: 76 y.o. MRN: [id number]  CC: No chief complaint on file.   HPI [name] presents for HTN - better; elevated glucose, edema Pt asked for more labs Well exam   Outpatient Medications Prior to Visit  Medication Sig Dispense Refill   amitriptyline (ELAVIL) 25 MG tablet Take 1 tablet (25 mg total) by mouth at bedtime as needed for sleep. Due for routine OV 30 tablet 5   brimonidine (ALPHAGAN) 0.2 % ophthalmic solution SMARTSIG:1 Drop(s) In Eye(s) Every 12 Hours     candesartan (ATACAND) 32 MG tablet Take 1 tablet (32 mg total) by mouth daily. 90 tablet 3   cholecalciferol (VITAMIN D) 1000 UNITS tablet Take 1,000 Units by mouth every other day.      COMBIGAN 0.2-0.5 % ophthalmic solution INSTILL 1 DROP INTO BOTH EYES TWICE A DAY 5 mL 2   Ferrous Sulfate (IRON) 325 (65 FE) MG TABS Take 1 tablet by mouth 3 (three) times a week.      hydrochlorothiazide (HYDRODIURIL) 12.5 MG tablet TAKE 1 TABLET BY MOUTH EVERY DAY 90 tablet 1   hydrocortisone (PROCTOCORT) 1 % CREA Apply 1 application topically 2 (two) times daily as needed (dry skin).      pantoprazole (PROTONIX) 40 MG tablet TAKE 1 TABLET BY MOUTH EVERY DAY 90 tablet 3   spironolactone (ALDACTONE) 50 MG tablet Take 1 tablet (50 mg total) by mouth daily. 90 tablet 3   timolol (TIMOPTIC) 0.5 % ophthalmic solution 1 drop every morning.     triamcinolone cream (KENALOG) 0.5 % APPLY TOPICALLY 2 (TWO) TIMES DAILY AS NEEDED. 30 g 0   loratadine (CLARITIN) 10 MG tablet Take 1 tablet (10 mg total) by mouth daily. (Patient not taking: Reported on 03/15/2022) 30 tablet 11   No facility-administered medications prior to visit.    ROS: Review of Systems  Constitutional:  Negative for activity change, appetite change, chills, fatigue and unexpected weight change.  HENT:  Negative for congestion, mouth sores and sinus pressure.   Eyes:  Negative for visual disturbance.   Respiratory:  Negative for cough and chest tightness.   Gastrointestinal:  Negative for abdominal pain and nausea.  Genitourinary:  Negative for difficulty urinating, frequency and vaginal pain.  Musculoskeletal:  Negative for back pain and gait problem.  Skin:  Negative for pallor and rash.  Neurological:  Negative for dizziness, tremors, weakness, numbness and headaches.  Psychiatric/Behavioral:  Negative for confusion and sleep disturbance.     Objective:  BP 130/82 (BP Location: Left Arm, Patient Position: Sitting, Cuff Size: Normal)   Pulse 62   Temp 98.4 F (36.9 C) (Oral)   Ht '5\' 3"'$  (1.6 m)   Wt 164 lb (74.4 kg)   SpO2 94%   BMI 29.05 kg/m   BP Readings from Last 3 Encounters:  04/03/22 130/82  12/26/21 (!) 152/101  11/07/21 (!) 172/96    Wt Readings from Last 3 Encounters:  04/03/22 164 lb (74.4 kg)  12/26/21 167 lb 3.2 oz (75.8 kg)  11/07/21 172 lb 9.6 oz (78.3 kg)    Physical Exam Constitutional:      General: She is not in acute distress.    Appearance: She is well-developed.  HENT:     Head: Normocephalic.     Right Ear: External ear normal.     Left Ear: External ear normal.     Nose: Nose normal.  Eyes:  General:        Right eye: No discharge.        Left eye: No discharge.     Conjunctiva/sclera: Conjunctivae normal.     Pupils: Pupils are equal, round, and reactive to light.  Neck:     Thyroid: No thyromegaly.     Vascular: No JVD.     Trachea: No tracheal deviation.  Cardiovascular:     Rate and Rhythm: Normal rate and regular rhythm.     Heart sounds: Normal heart sounds.  Pulmonary:     Effort: No respiratory distress.     Breath sounds: No stridor. No wheezing.  Abdominal:     General: Bowel sounds are normal. There is no distension.     Palpations: Abdomen is soft. There is no mass.     Tenderness: There is no abdominal tenderness. There is no guarding or rebound.  Musculoskeletal:        General: No tenderness.     Cervical  back: Normal range of motion and neck supple. No rigidity.  Lymphadenopathy:     Cervical: No cervical adenopathy.  Skin:    Findings: No erythema or rash.  Neurological:     Cranial Nerves: No cranial nerve deficit.     Motor: No abnormal muscle tone.     Coordination: Coordination normal.     Deep Tendon Reflexes: Reflexes normal.  Psychiatric:        Behavior: Behavior normal.        Thought Content: Thought content normal.        Judgment: Judgment normal.     Lab Results  Component Value Date   WBC 4.1 01/25/2021   HGB 12.8 01/25/2021   HCT 37.7 01/25/2021   PLT 180.0 01/25/2021   GLUCOSE 128 (H) 11/13/2021   CHOL 152 01/25/2021   TRIG 195.0 (H) 01/25/2021   HDL 43.70 01/25/2021   LDLCALC 69 01/25/2021   ALT 16 10/09/2021   AST 16 10/09/2021   NA 142 11/13/2021   K 4.3 11/13/2021   CL 105 11/13/2021   CREATININE 0.97 11/13/2021   BUN 14 11/13/2021   CO2 22 11/13/2021   TSH 0.75 01/25/2021   INR 0.96 05/09/2011   HGBA1C 6.2 10/09/2021    VAS [place] LOWER EXTREMITY VENOUS (DVT)  Result Date: 06/14/2021  Lower Venous Reflux Study Patient Name:  Kelly Santos Esh  Date of Exam:   06/14/2021 Medical Rec #: [id number]      Accession #:    [id number] Date of Birth: November 20, 1945       Patient Gender: F Patient Age:   63 years Exam Location:  [facility] Procedure:      VAS [place] LOWER EXTREMITY VENOUS REFLUX Referring Phys: Marzetta Board BURNS --------------------------------------------------------------------------------  Indications: Left lower extremity pain and swelling. Study ordered as a rule out DVT exam which was performed in conjunction with a left leg reflux exam.  Risk Factors: Significant varicosities of the left lower extremity. Performing Technologist: Ronal Fear RVS, RCS  Examination Guidelines: A complete evaluation includes B-mode imaging, spectral Doppler, color Doppler, and power Doppler as needed of all accessible portions of each vessel. Bilateral testing  is considered an integral part of a complete examination. Limited examinations for reoccurring indications may be performed as noted. The reflux portion of the exam is performed with the patient in reverse Trendelenburg. Significant venous reflux is defined as >500 ms in the superficial venous system, and >1 second in the deep venous system.  +--------------+---------+------+-----------+------------+--------+  LEFT          Reflux NoRefluxReflux TimeDiameter cmsComments                         Yes                                  +--------------+---------+------+-----------+------------+--------+ CFV                     yes   >1 second                      +--------------+---------+------+-----------+------------+--------+ FV mid                  yes   >1 second                      +--------------+---------+------+-----------+------------+--------+ Popliteal     no                                             +--------------+---------+------+-----------+------------+--------+ GSV at SFJ              yes    >500 ms      1.25             +--------------+---------+------+-----------+------------+--------+ GSV prox thigh          yes    >500 ms      0.69             +--------------+---------+------+-----------+------------+--------+ GSV mid thigh           yes    >500 ms      0.72             +--------------+---------+------+-----------+------------+--------+ GSV dist thigh          yes    >500 ms      0.68             +--------------+---------+------+-----------+------------+--------+ GSV at knee             yes    >500 ms      0.65             +--------------+---------+------+-----------+------------+--------+ GSV prox calf           yes    >500 ms      0.70             +--------------+---------+------+-----------+------------+--------+ SSV Pop Fossa no                            0.19              +--------------+---------+------+-----------+------------+--------+  Findings attempted to be reported to Dr. Billey Gosling office at 11:25 am. No answer. Results will be routed in epic.  Summary: Left: - No evidence of deep vein thrombosis from the common femoral through the popliteal veins. - No evidence of superficial venous thrombosis. - The deep venous system is not competent. - The great saphenous vein is not competent. - The small saphenous vein is competent.  *See table(s) above for measurements and observations. Electronically signed by Jamelle Haring on 06/14/2021 at 1:34:39 PM.    Final     Assessment & Plan:   Problem List Items Addressed  This Visit     Hyperglycemia    Check A1c On diet      Relevant Orders   Hemoglobin A1c   Hypertension, uncontrolled    Much better      Paresthesia   Relevant Orders   TSH   Vitamin B12   Magnesium   Well adult exam - Primary     We discussed age appropriate health related issues, including available/recomended screening tests and vaccinations. Labs were ordered to be later reviewed . All questions were answered. We discussed one or more of the following - seat belt use, use of sunscreen/sun exposure exercise, fall risk reduction, second hand smoke exposure, firearm use and storage, seat belt use, a need for adhering to healthy diet and exercise. Labs were ordered.  All questions were answered. Last colon/EGD 2022 - Dr Henrene Pastor, no need to repeat PAP/DEXA/mammo with Dr Helane Rima       Relevant Orders   TSH   Urinalysis   CBC with Differential/Platelet   Lipid panel   Comprehensive metabolic panel   Hemoglobin A1c   Vitamin B12   Magnesium      No orders of the defined types were placed in this encounter.     Follow-up: Return in about 6 months (around 10/04/2022) for a follow-up visit.  Walker Kehr, MD

## 2022-04-03 NOTE — Assessment & Plan Note (Signed)
Much better 

## 2022-04-03 NOTE — Assessment & Plan Note (Signed)
Check A1c On diet 

## 2022-04-30 ENCOUNTER — Other Ambulatory Visit: Payer: Self-pay | Admitting: Internal Medicine

## 2022-05-31 ENCOUNTER — Other Ambulatory Visit: Payer: Self-pay | Admitting: Internal Medicine

## 2022-06-21 DIAGNOSIS — H35373 Puckering of macula, bilateral: Secondary | ICD-10-CM | POA: Diagnosis not present

## 2022-06-21 DIAGNOSIS — H401122 Primary open-angle glaucoma, left eye, moderate stage: Secondary | ICD-10-CM | POA: Diagnosis not present

## 2022-06-21 DIAGNOSIS — H401111 Primary open-angle glaucoma, right eye, mild stage: Secondary | ICD-10-CM | POA: Diagnosis not present

## 2022-06-21 DIAGNOSIS — Z961 Presence of intraocular lens: Secondary | ICD-10-CM | POA: Diagnosis not present

## 2022-07-17 ENCOUNTER — Other Ambulatory Visit (HOSPITAL_BASED_OUTPATIENT_CLINIC_OR_DEPARTMENT_OTHER): Payer: Self-pay | Admitting: Cardiovascular Disease

## 2022-08-15 ENCOUNTER — Ambulatory Visit (INDEPENDENT_AMBULATORY_CARE_PROVIDER_SITE_OTHER): Payer: Medicare Other | Admitting: Internal Medicine

## 2022-08-15 ENCOUNTER — Encounter: Payer: Self-pay | Admitting: Internal Medicine

## 2022-08-15 VITALS — BP 130/80 | HR 64 | Temp 98.2°F | Ht 63.0 in | Wt 168.0 lb

## 2022-08-15 DIAGNOSIS — R739 Hyperglycemia, unspecified: Secondary | ICD-10-CM

## 2022-08-15 DIAGNOSIS — E049 Nontoxic goiter, unspecified: Secondary | ICD-10-CM | POA: Diagnosis not present

## 2022-08-15 DIAGNOSIS — J069 Acute upper respiratory infection, unspecified: Secondary | ICD-10-CM

## 2022-08-15 DIAGNOSIS — B353 Tinea pedis: Secondary | ICD-10-CM

## 2022-08-15 DIAGNOSIS — E876 Hypokalemia: Secondary | ICD-10-CM | POA: Diagnosis not present

## 2022-08-15 LAB — CBC WITH DIFFERENTIAL/PLATELET
Basophils Absolute: 0 10*3/uL (ref 0.0–0.1)
Basophils Relative: 0.8 % (ref 0.0–3.0)
Eosinophils Absolute: 0.2 10*3/uL (ref 0.0–0.7)
Eosinophils Relative: 7.3 % — ABNORMAL HIGH (ref 0.0–5.0)
HCT: 33.5 % — ABNORMAL LOW (ref 36.0–46.0)
Hemoglobin: 11.6 g/dL — ABNORMAL LOW (ref 12.0–15.0)
Lymphocytes Relative: 28.5 % (ref 12.0–46.0)
Lymphs Abs: 0.9 10*3/uL (ref 0.7–4.0)
MCHC: 34.5 g/dL (ref 30.0–36.0)
MCV: 98.7 fl (ref 78.0–100.0)
Monocytes Absolute: 0.4 10*3/uL (ref 0.1–1.0)
Monocytes Relative: 11.2 % (ref 3.0–12.0)
Neutro Abs: 1.6 10*3/uL (ref 1.4–7.7)
Neutrophils Relative %: 52.2 % (ref 43.0–77.0)
Platelets: 215 10*3/uL (ref 150.0–400.0)
RBC: 3.4 Mil/uL — ABNORMAL LOW (ref 3.87–5.11)
RDW: 12.4 % (ref 11.5–15.5)
WBC: 3.2 10*3/uL — ABNORMAL LOW (ref 4.0–10.5)

## 2022-08-15 LAB — COMPREHENSIVE METABOLIC PANEL
ALT: 16 U/L (ref 0–35)
AST: 19 U/L (ref 0–37)
Albumin: 3.9 g/dL (ref 3.5–5.2)
Alkaline Phosphatase: 46 U/L (ref 39–117)
BUN: 18 mg/dL (ref 6–23)
CO2: 28 mEq/L (ref 19–32)
Calcium: 9.6 mg/dL (ref 8.4–10.5)
Chloride: 104 mEq/L (ref 96–112)
Creatinine, Ser: 0.88 mg/dL (ref 0.40–1.20)
GFR: 63.94 mL/min (ref >60.00)
Glucose, Bld: 86 mg/dL (ref 70–99)
Potassium: 4 mEq/L (ref 3.5–5.1)
Sodium: 136 mEq/L (ref 135–145)
Total Bilirubin: 0.4 mg/dL (ref 0.2–1.2)
Total Protein: 7.2 g/dL (ref 6.0–8.3)

## 2022-08-15 LAB — TSH: TSH: 1.24 u[IU]/mL (ref 0.35–5.50)

## 2022-08-15 LAB — HEMOGLOBIN A1C: Hgb A1c MFr Bld: 6.1 % (ref 4.6–6.5)

## 2022-08-15 LAB — T4, FREE: Free T4: 0.75 ng/dL (ref 0.60–1.60)

## 2022-08-15 MED ORDER — KETOCONAZOLE 2 % EX CREA: 1.0000 | TOPICAL_CREAM | Freq: Every day | CUTANEOUS | 1 refills | Status: DC

## 2022-08-15 NOTE — Progress Notes (Signed)
Subjective:  Patient ID: Kelly Santos, female    DOB: [birth year]  Age: 76 y.o. MRN: [id number]  CC: Nasal Congestion (Itching inbetween toes and have some white stuff)   HPI [name] presents for URI sx's C/o rash between toes F/u HTN, hyperglycemia, abn CBC  Outpatient Medications Prior to Visit  Medication Sig Dispense Refill   amitriptyline (ELAVIL) 25 MG tablet TAKE 1 TABLET (25 MG TOTAL) BY MOUTH AT BEDTIME AS NEEDED FOR SLEEP. DUE FOR ROUTINE OV 90 tablet 3   brimonidine (ALPHAGAN) 0.2 % ophthalmic solution SMARTSIG:1 Drop(s) In Eye(s) Every 12 Hours     candesartan (ATACAND) 32 MG tablet Take 1 tablet (32 mg total) by mouth daily. 90 tablet 3   cholecalciferol (VITAMIN D) 1000 UNITS tablet Take 1,000 Units by mouth every other day.      COMBIGAN 0.2-0.5 % ophthalmic solution INSTILL 1 DROP INTO BOTH EYES TWICE A DAY 5 mL 2   Ferrous Sulfate (IRON) 325 (65 FE) MG TABS Take 1 tablet by mouth 3 (three) times a week.      hydrochlorothiazide (HYDRODIURIL) 12.5 MG tablet TAKE 1 TABLET BY MOUTH EVERY DAY 90 tablet 1   hydrocortisone (PROCTOCORT) 1 % CREA Apply 1 application topically 2 (two) times daily as needed (dry skin).      pantoprazole (PROTONIX) 40 MG tablet Take 1 tablet (40 mg total) by mouth daily. 90 tablet 3   spironolactone (ALDACTONE) 50 MG tablet TAKE 1 TABLET BY MOUTH DAILY 100 tablet 2   timolol (TIMOPTIC) 0.5 % ophthalmic solution 1 drop every morning.     triamcinolone cream (KENALOG) 0.5 % APPLY TOPICALLY 2 TIMES DAILY AS NEEDED. 30 g 0   No facility-administered medications prior to visit.    ROS: Review of Systems  Objective:  BP 130/80 (BP Location: Right Arm, Patient Position: Sitting, Cuff Size: Normal)   Pulse 64   Temp 98.2 F (36.8 C) (Oral)   Ht '5\' 3"'$  (1.6 m)   Wt 168 lb (76.2 kg)   SpO2 98%   BMI 29.76 kg/m   BP Readings from Last 3 Encounters:  08/15/22 130/80  04/03/22 130/82  12/26/21 (!) 152/101    Wt Readings from Last 3  Encounters:  08/15/22 168 lb (76.2 kg)  04/03/22 164 lb (74.4 kg)  12/26/21 167 lb 3.2 oz (75.8 kg)    Physical Exam T pedis rash Onycho big toes B Lab Results  Component Value Date   WBC 3.0 (L) 04/03/2022   HGB 12.3 04/03/2022   HCT 35.6 (L) 04/03/2022   PLT 176.0 04/03/2022   GLUCOSE 110 (H) 04/03/2022   CHOL 161 04/03/2022   TRIG 99.0 04/03/2022   HDL 42.30 04/03/2022   LDLCALC 99 04/03/2022   ALT 16 04/03/2022   AST 18 04/03/2022   NA 139 04/03/2022   K 4.4 04/03/2022   CL 105 04/03/2022   CREATININE 0.99 04/03/2022   BUN 14 04/03/2022   CO2 28 04/03/2022   TSH 1.09 04/03/2022   INR 0.96 05/09/2011   HGBA1C 6.0 04/03/2022   HGBA1C 6.0 04/03/2022    VAS [place] LOWER EXTREMITY VENOUS (DVT)  Result Date: 06/14/2021  Lower Venous Reflux Study Patient Name:  Kelly Santos Brau  Date of Exam:   06/14/2021 Medical Rec #: [id number]      Accession #:    [id number] Date of Birth: September 15, 1946       Patient Gender: F Patient Age:   13 years Exam [facility] Procedure:      VAS [place] LOWER EXTREMITY VENOUS REFLUX Referring Phys: STACY BURNS --------------------------------------------------------------------------------  Indications: Left lower extremity pain and swelling. Study ordered as a rule out DVT exam which was performed in conjunction with a left leg reflux exam.  Risk Factors: Significant varicosities of the left lower extremity. Performing Technologist: Ronal Fear RVS, RCS  Examination Guidelines: A complete evaluation includes B-mode imaging, spectral Doppler, color Doppler, and power Doppler as needed of all accessible portions of each vessel. Bilateral testing is considered an integral part of a complete examination. Limited examinations for reoccurring indications may be performed as noted. The reflux portion of the exam is performed with the patient in reverse Trendelenburg. Significant venous reflux is defined as >500 ms in the superficial venous system,  and >1 second in the deep venous system.  +--------------+---------+------+-----------+------------+--------+ LEFT          Reflux NoRefluxReflux TimeDiameter cmsComments                         Yes                                  +--------------+---------+------+-----------+------------+--------+ CFV                     yes   >1 second                      +--------------+---------+------+-----------+------------+--------+ FV mid                  yes   >1 second                      +--------------+---------+------+-----------+------------+--------+ Popliteal     no                                             +--------------+---------+------+-----------+------------+--------+ GSV at SFJ              yes    >500 ms      1.25             +--------------+---------+------+-----------+------------+--------+ GSV prox thigh          yes    >500 ms      0.69             +--------------+---------+------+-----------+------------+--------+ GSV mid thigh           yes    >500 ms      0.72             +--------------+---------+------+-----------+------------+--------+ GSV dist thigh          yes    >500 ms      0.68             +--------------+---------+------+-----------+------------+--------+ GSV at knee             yes    >500 ms      0.65             +--------------+---------+------+-----------+------------+--------+ GSV prox calf           yes    >500 ms      0.70             +--------------+---------+------+-----------+------------+--------+ SSV Pop Fossa no  0.19             +--------------+---------+------+-----------+------------+--------+  Findings attempted to be reported to Dr. Billey Gosling office at 11:25 am. No answer. Results will be routed in epic.  Summary: Left: - No evidence of deep vein thrombosis from the common femoral through the popliteal veins. - No evidence of superficial venous thrombosis. -  The deep venous system is not competent. - The great saphenous vein is not competent. - The small saphenous vein is competent.  *See table(s) above for measurements and observations. Electronically signed by Jamelle Haring on 06/14/2021 at 1:34:39 PM.    Final     Assessment & Plan:   Problem List Items Addressed This Visit     Hypokalemia   Relevant Orders   Comprehensive metabolic panel   Tinea pedis    Start Ketocon cream       Relevant Medications   ketoconazole (NIZORAL) 2 % cream   Other Relevant Orders   CBC with Differential/Platelet   Hyperglycemia    Check A1c      Relevant Orders   Comprehensive metabolic panel   Hemoglobin A1c   Upper respiratory infection - Primary    New - viral Resolving      Relevant Medications   ketoconazole (NIZORAL) 2 % cream   Other Relevant Orders   CBC with Differential/Platelet   Comprehensive metabolic panel   TSH   T4, free   Goiter    Check TSH      Relevant Orders   CBC with Differential/Platelet   TSH   T4, free      Meds ordered this encounter  Medications   ketoconazole (NIZORAL) 2 % cream    Sig: Apply 1 Application topically daily.    Dispense:  45 g    Refill:  1      Follow-up: No follow-ups on file.  Walker Kehr, MD

## 2022-08-15 NOTE — Assessment & Plan Note (Signed)
New - viral Resolving

## 2022-08-15 NOTE — Assessment & Plan Note (Signed)
Start Ketocon cream

## 2022-08-15 NOTE — Assessment & Plan Note (Signed)
Check TSH 

## 2022-08-15 NOTE — Assessment & Plan Note (Signed)
Check A1c. 

## 2022-08-25 ENCOUNTER — Other Ambulatory Visit (HOSPITAL_BASED_OUTPATIENT_CLINIC_OR_DEPARTMENT_OTHER): Payer: Self-pay | Admitting: Cardiovascular Disease

## 2022-08-26 NOTE — Telephone Encounter (Signed)
Rx request sent to pharmacy.  

## 2022-09-02 ENCOUNTER — Other Ambulatory Visit: Payer: Self-pay | Admitting: Internal Medicine

## 2022-11-22 ENCOUNTER — Other Ambulatory Visit (HOSPITAL_BASED_OUTPATIENT_CLINIC_OR_DEPARTMENT_OTHER): Payer: Self-pay | Admitting: Cardiovascular Disease

## 2022-11-22 ENCOUNTER — Other Ambulatory Visit: Payer: Self-pay | Admitting: Internal Medicine

## 2022-11-22 ENCOUNTER — Encounter (HOSPITAL_BASED_OUTPATIENT_CLINIC_OR_DEPARTMENT_OTHER): Payer: Self-pay | Admitting: *Deleted

## 2022-11-22 NOTE — Telephone Encounter (Signed)
Pt is overdue for follow-up ADV HTN. Pt needing refills. Can you advise on this please?

## 2022-12-26 ENCOUNTER — Telehealth: Payer: Self-pay | Admitting: Internal Medicine

## 2022-12-26 ENCOUNTER — Other Ambulatory Visit: Payer: Self-pay

## 2022-12-26 MED ORDER — TRIAMCINOLONE ACETONIDE 0.5 % EX CREA: TOPICAL_CREAM | CUTANEOUS | 2 refills | Status: DC

## 2022-12-26 NOTE — Telephone Encounter (Signed)
Prescription Request  12/26/2022  LOV: 08/15/2022  What is the name of the medication or equipment? triminisilone  Have you contacted your pharmacy to request a refill? Yes   Which pharmacy would you like this sent to?  [id number] - [place], [place] [address] Phone: [phone] Fax: [phone]   Patient notified that their request is being sent to the clinical staff for review and that they should receive a response within 2 business days.   Please advise at Mobile [phone] (mobile)

## 2023-02-10 DIAGNOSIS — Z1231 Encounter for screening mammogram for malignant neoplasm of breast: Secondary | ICD-10-CM | POA: Diagnosis not present

## 2023-02-18 ENCOUNTER — Other Ambulatory Visit (HOSPITAL_BASED_OUTPATIENT_CLINIC_OR_DEPARTMENT_OTHER): Payer: Self-pay | Admitting: Internal Medicine

## 2023-03-13 ENCOUNTER — Ambulatory Visit (INDEPENDENT_AMBULATORY_CARE_PROVIDER_SITE_OTHER): Payer: Medicare Other

## 2023-03-13 VITALS — Ht 63.0 in | Wt 170.0 lb

## 2023-03-13 DIAGNOSIS — Z0000 Encounter for general adult medical examination without abnormal findings: Secondary | ICD-10-CM

## 2023-03-13 NOTE — Progress Notes (Cosign Needed Addendum)
I connected with  Lawan T Reiber on 03/13/23 by a audio enabled telemedicine application and verified that I am speaking with the correct person using two identifiers.  Patient Location: Home  Provider Location: Office/Clinic  I discussed the limitations of evaluation and management by telemedicine. The patient expressed understanding and agreed to proceed.  Subjective:   Kelly Santos is a 77 y.o. female who presents for Medicare Annual (Subsequent) preventive examination.  Review of Systems     Cardiac Risk Factors include: advanced age (>43men, >8 women);hypertension;family history of premature cardiovascular disease;obesity (BMI >30kg/m2)     Objective:    Today's Vitals   03/13/23 1502  Weight: 170 lb (77.1 kg)  Height: 5\' 3"  (1.6 m)  PainSc: 0-No pain   Body mass index is 30.11 kg/m.     03/13/2023    3:03 PM 03/15/2022    9:39 AM 06/06/2020    4:09 PM 03/26/2020   11:26 PM 05/06/2018    1:44 PM 04/07/2017    8:41 PM 09/29/2015    1:09 PM  Advanced Directives  Does Patient Have a Medical Advance Directive? No No No No No No No  Would patient like information on creating a medical advance directive? No - Patient declined No - Patient declined    No - Patient declined     Current Medications (verified) Outpatient Encounter Medications as of 03/13/2023  Medication Sig   amitriptyline (ELAVIL) 25 MG tablet TAKE 1 TABLET (25 MG TOTAL) BY MOUTH AT BEDTIME AS NEEDED FOR SLEEP. DUE FOR ROUTINE OV   brimonidine (ALPHAGAN) 0.2 % ophthalmic solution SMARTSIG:1 Drop(s) In Eye(s) Every 12 Hours   candesartan (ATACAND) 32 MG tablet Take 1 tablet (32 mg total) by mouth daily.   cholecalciferol (VITAMIN D) 1000 UNITS tablet Take 1,000 Units by mouth every other day.    COMBIGAN 0.2-0.5 % ophthalmic solution INSTILL 1 DROP INTO BOTH EYES TWICE A DAY   Ferrous Sulfate (IRON) 325 (65 FE) MG TABS Take 1 tablet by mouth 3 (three) times a week.    hydrochlorothiazide (HYDRODIURIL) 12.5 MG  tablet TAKE 1 TABLET BY MOUTH DAILY. CALL OFFICE FOR APPOINTMENT, NO MORE REFILLS UNTIL SEEN   hydrocortisone (PROCTOCORT) 1 % CREA Apply 1 application topically 2 (two) times daily as needed (dry skin).    ketoconazole (NIZORAL) 2 % cream Apply 1 Application topically daily.   pantoprazole (PROTONIX) 40 MG tablet Take 1 tablet (40 mg total) by mouth daily.   spironolactone (ALDACTONE) 25 MG tablet TAKE 1 TABLET BY MOUTH EVERY DAY   spironolactone (ALDACTONE) 50 MG tablet TAKE 1 TABLET BY MOUTH DAILY   timolol (TIMOPTIC) 0.5 % ophthalmic solution 1 drop every morning.   triamcinolone cream (KENALOG) 0.5 % APPLY TOPICALLY 2 TIMES DAILY AS NEEDED.   No facility-administered encounter medications on file as of 03/13/2023.    Allergies (verified) Amlodipine, Hctz [hydrochlorothiazide], and Metoprolol   History: Past Medical History:  Diagnosis Date   Anemia    Aortic atherosclerosis (HCC) 11/07/2021   CAD (coronary artery disease)    no cardiologist, followed by PCP only   Cataract    bil cataracts removed   Diverticulosis    Generalized headaches    GERD (gastroesophageal reflux disease)    Glaucoma    Hemorrhoids    Hiatal hernia    On CT done Jan 31, 2011    Hx of colonic polyps    Hypertension    Hyperthyroidism    Osteoarthritis    Uterine  polyp    Vitamin D deficiency    Past Surgical History:  Procedure Laterality Date   ABDOMINAL HYSTERECTOMY N/A 04/26/2013   Procedure: HYSTERECTOMY ABDOMINAL;  Surgeon: Jeani Hawking, MD;  Location: [facility];  Service: Gynecology;  Laterality: N/A;   COLONOSCOPY     DILATION AND CURETTAGE OF UTERUS  01/18/2013   with uterine polypectomy   SALPINGOOPHORECTOMY Bilateral 04/26/2013   Procedure: SALPINGO OOPHORECTOMY;  Surgeon: Jeani Hawking, MD;  Location: [facility];  Service: Gynecology;  Laterality: Bilateral;   UPPER GASTROINTESTINAL ENDOSCOPY     Family History  Problem Relation Age of Onset   Hypertension Mother    Hypertension  Father    Heart failure Father    Deep vein thrombosis Son 35       x 2, chronic coumadin   Hypertension Sister    CVA Brother    Diabetes Brother    Hypertension Brother    Colon cancer Neg Hx    Esophageal cancer Neg Hx    Stomach cancer Neg Hx    Rectal cancer Neg Hx    Social History   Socioeconomic History   Marital status: Divorced    Spouse name: n/a   Number of children: 3   Years of education: Not on file   Highest education level: Not on file  Occupational History   Occupation: CNA    Employer: [employer]    Comment: [employer]  Tobacco Use   Smoking status: Never   Smokeless tobacco: Never  Vaping Use   Vaping Use: Never used  Substance and Sexual Activity   Alcohol use: No    Alcohol/week: 0.0 standard drinks of alcohol   Drug use: No   Sexual activity: Yes  Other Topics Concern   Not on file  Social History Narrative   Lives with her daughter and granddaughter.   Social Determinants of Health   Financial Resource Strain: Low Risk  (03/13/2023)   Overall Financial Resource Strain (CARDIA)    Difficulty of Paying Living Expenses: Not very hard  Food Insecurity: No Food Insecurity (03/13/2023)   Hunger Vital Sign    Worried About Running Out of Food in the Last Year: Never true    Ran Out of Food in the Last Year: Never true  Transportation Needs: No Transportation Needs (03/13/2023)   PRAPARE - Administrator, Civil Service (Medical): No    Lack of Transportation (Non-Medical): No  Physical Activity: Sufficiently Active (03/13/2023)   Exercise Vital Sign    Days of Exercise per Week: 5 days    Minutes of Exercise per Session: 30 min  Stress: No Stress Concern Present (03/13/2023)   [employer] of Occupational Health - Occupational Stress Questionnaire    Feeling of Stress : Not at all  Social Connections: Moderately Isolated (03/13/2023)   Social Connection and Isolation Panel [NHANES]    Frequency of Communication  with Friends and Family: More than three times a week    Frequency of Social Gatherings with Friends and Family: Three times a week    Attends Religious Services: More than 4 times per year    Active Member of Clubs or Organizations: No    Attends Banker Meetings: Never    Marital Status: Divorced    Tobacco Counseling Counseling given: Not Answered   Clinical Intake:  Pre-visit preparation completed: Yes  Pain : No/denies pain Pain Score: 0-No pain     BMI - recorded: 30.11 Nutritional  Status: BMI > 30  Obese Nutritional Risks: None Diabetes: No  How often do you need to have someone help you when you read instructions, pamphlets, or other written materials from your doctor or pharmacy?: 1 - Never What is the last grade level you completed in school?: 8th grade  Diabetic? No  Interpreter Needed?: No  Information entered by :: Marcel Gary N. Sumeya Yontz, LPN.   Activities of Daily Living    03/13/2023    3:06 PM 03/15/2022    9:41 AM  In your present state of health, do you have any difficulty performing the following activities:  Hearing? 0 0  Vision? 0 0  Difficulty concentrating or making decisions? 0 0  Walking or climbing stairs? 0 0  Dressing or bathing? 0 0  Doing errands, shopping? 0 0  Preparing Food and eating ? N N  Using the Toilet? N N  In the past six months, have you accidently leaked urine? N N  Do you have problems with loss of bowel control? N N  Managing your Medications? N N  Managing your Finances? N N  Housekeeping or managing your Housekeeping? N N    Patient Care Team: Plotnikov, Georgina Quint, MD as PCP - General Chilton Si, MD as Attending Physician (Cardiology) Marcelle Overlie, MD as Consulting Physician (Obstetrics and Gynecology) Rodrigo Ran, OD as Consulting Physician (Optometry)  Indicate any recent Medical Services you may have received from other than [facility] providers in the past year (date may be  approximate).     Assessment:   This is a routine wellness examination for Kelly Santos.  Hearing/Vision screen Hearing Screening - Comments:: Denies hearing difficulties  . Vision Screening - Comments:: Wears rx glasses - up to date with routine eye exams with [facility] (Dr. Parke Simmers)   Dietary issues and exercise activities discussed: Current Exercise Habits: Home exercise routine, Type of exercise: walking;Other - see comments (5k-10k steps daily), Time (Minutes): 30, Frequency (Times/Week): 5, Weekly Exercise (Minutes/Week): 150, Intensity: Moderate, Exercise limited by: orthopedic condition(s)   Goals Addressed             This Visit's Progress    My goal for 2024 is to stay consistent with walking and lose weight.        Depression Screen    03/13/2023    3:05 PM 08/15/2022   10:03 AM 03/15/2022    9:34 AM 03/09/2021    1:37 PM 01/26/2020   10:53 AM 05/22/2018   10:34 AM 01/14/2017    9:14 AM  PHQ 2/9 Scores  PHQ - 2 Score 0 0 0 0 0 0 0  PHQ- 9 Score 2          Fall Risk    03/13/2023    3:04 PM 08/15/2022   10:03 AM 03/15/2022    9:40 AM 03/09/2021    1:37 PM 01/26/2020   10:53 AM  Fall Risk   Falls in the past year? 0 0 0 0 0  Number falls in past yr: 0 0 0 0   Injury with Fall? 0 0 0 0   Risk for fall due to : No Fall Risks No Fall Risks No Fall Risks No Fall Risks   Follow up Falls prevention discussed Falls evaluation completed Falls evaluation completed  Falls evaluation completed    FALL RISK PREVENTION PERTAINING TO THE HOME:  Any stairs in or around the home? No  If so, are there any without handrails? No  Home free of loose  throw rugs in walkways, pet beds, electrical cords, etc? Yes  Adequate lighting in your home to reduce risk of falls? Yes   ASSISTIVE DEVICES UTILIZED TO PREVENT FALLS:  Life alert? No  Use of a cane, walker or w/c? No  Grab bars in the bathroom? Yes  Shower chair or bench in shower? Yes  Elevated toilet seat or a handicapped  toilet? Yes   TIMED UP AND GO:  Was the test performed? No . Telephonic Visit   Cognitive Function:        03/13/2023    3:06 PM 03/15/2022    9:42 AM  6CIT Screen  What Year? 0 points 0 points  What month? 0 points 0 points  What time? 0 points 0 points  Count back from 20 0 points 0 points  Months in reverse 0 points 0 points  Repeat phrase 0 points 2 points  Total Score 0 points 2 points    Immunizations Immunization History  Administered Date(s) Administered   PFIZER(Purple Top)SARS-COV-2 Vaccination 09/20/2020, 10/18/2020   Pneumococcal Conjugate-13 01/31/2015   Pneumococcal Polysaccharide-23 04/27/2013   Td 09/21/2010    TDAP status: Due, Education has been provided regarding the importance of this vaccine. Advised may receive this vaccine at local pharmacy or Health Dept. Aware to provide a copy of the vaccination record if obtained from local pharmacy or Health Dept. Verbalized acceptance and understanding.  Flu Vaccine status: Declined, Education has been provided regarding the importance of this vaccine but patient still declined. Advised may receive this vaccine at local pharmacy or Health Dept. Aware to provide a copy of the vaccination record if obtained from local pharmacy or Health Dept. Verbalized acceptance and understanding.  Pneumococcal vaccine status: Up to date  Covid-19 vaccine status: Completed vaccines  Qualifies for Shingles Vaccine? Yes   Zostavax completed No   Shingrix Completed?: No.    Education has been provided regarding the importance of this vaccine. Patient has been advised to call insurance company to determine out of pocket expense if they have not yet received this vaccine. Advised may also receive vaccine at local pharmacy or Health Dept. Verbalized acceptance and understanding.  Screening Tests Health Maintenance  Topic Date Due   Zoster Vaccines- Shingrix (1 of 2) Never done   DTaP/Tdap/Td (2 - Tdap) 09/21/2020   COVID-19  Vaccine (3 - 2023-24 season) 05/31/2022   Hepatitis C Screening  03/16/2023 (Originally 06/04/1964)   INFLUENZA VACCINE  05/01/2023   MAMMOGRAM  02/10/2024   Medicare Annual Wellness (AWV)  03/12/2024   Pneumonia Vaccine 85+ Years old  Completed   DEXA SCAN  Completed   HPV VACCINES  Aged Out   Colonoscopy  Discontinued    Health Maintenance  Health Maintenance Due  Topic Date Due   Zoster Vaccines- Shingrix (1 of 2) Never done   DTaP/Tdap/Td (2 - Tdap) 09/21/2020   COVID-19 Vaccine (3 - 2023-24 season) 05/31/2022    Colorectal cancer screening: No longer required.   Mammogram status: Completed 02/10/2023. Repeat every year  Bone Density status: Completed 02/15/2022. Results reflect: Bone density results: OSTEOPENIA. Repeat every 2 years.  Lung Cancer Screening: (Low Dose CT Chest recommended if Age 86-80 years, 30 pack-year currently smoking OR have quit w/in 15years.) does not qualify.   Lung Cancer Screening Referral: no  Additional Screening:  Hepatitis C Screening: does qualify; Completed: no  Vision Screening: Recommended annual ophthalmology exams for early detection of glaucoma and other disorders of the eye. Is the patient up to  date with their annual eye exam?  Yes  Who is the provider or what is the name of the office in which the patient attends annual eye exams? [facility] If pt is not established with a provider, would they like to be referred to a provider to establish care? No .   Dental Screening: Recommended annual dental exams for proper oral hygiene  Community Resource Referral / Chronic Care Management: CRR required this visit?  No   CCM required this visit?  No      Plan:     I have personally reviewed and noted the following in the patient's chart:   Medical and social history Use of alcohol, tobacco or illicit drugs  Current medications and supplements including opioid prescriptions. Patient is not currently taking  opioid prescriptions. Functional ability and status Nutritional status Physical activity Advanced directives List of other physicians Hospitalizations, surgeries, and ER visits in previous 12 months Vitals Screenings to include cognitive, depression, and falls Referrals and appointments  In addition, I have reviewed and discussed with patient certain preventive protocols, quality metrics, and best practice recommendations. A written personalized care plan for preventive services as well as general preventive health recommendations were provided to patient.     Mickeal Needy, LPN   1/61/0960   Nurse Notes: Normal cognitive status assessed by direct observation via telephone conversation by this Nurse Health Advisor. No abnormalities found.   Medical screening examination/treatment/procedure(s) were performed by non-physician practitioner and as supervising physician I was immediately available for consultation/collaboration.  I agree with above. Jacinta Shoe, MD

## 2023-03-13 NOTE — Patient Instructions (Addendum)
Kelly Santos , Thank you for taking time to come for your Medicare Wellness Visit. I appreciate your ongoing commitment to your health goals. Please review the following plan we discussed and let me know if I can assist you in the future.   These are the goals we discussed:  Goals      My goal for 2024 is to stay consistent with walking and lose weight.        This is a list of the screening recommended for you and due dates:  Health Maintenance  Topic Date Due   Zoster (Shingles) Vaccine (1 of 2) Never done   DTaP/Tdap/Td vaccine (2 - Tdap) 09/21/2020   COVID-19 Vaccine (3 - 2023-24 season) 05/31/2022   Hepatitis C Screening  03/16/2023*   Flu Shot  05/01/2023   Medicare Annual Wellness Visit  03/12/2024   Pneumonia Vaccine  Completed   DEXA scan (bone density measurement)  Completed   HPV Vaccine  Aged Out   Colon Cancer Screening  Discontinued  *Topic was postponed. The date shown is not the original due date.    Advanced directives: No; Advance directive discussed with you today. Even though you declined this today please call our office should you change your mind and we can give you the proper paperwork for you to fill out.  Conditions/risks identified: Yes  Next appointment: Follow up in one year for your annual wellness visit via telephone call with Nurse Brandell Maready on 03/22/2024 at 3:00 p.m.  If you need to cancel or reschedule please call [phone].   Preventive Care 41 Years and Older, Female Preventive care refers to lifestyle choices and visits with your health care provider that can promote health and wellness. What does preventive care include? A yearly physical exam. This is also called an annual well check. Dental exams once or twice a year. Routine eye exams. Ask your health care provider how often you should have your eyes checked. Personal lifestyle choices, including: Daily care of your teeth and gums. Regular physical activity. Eating a healthy  diet. Avoiding tobacco and drug use. Limiting alcohol use. Practicing safe sex. Taking low-dose aspirin every day. Taking vitamin and mineral supplements as recommended by your health care provider. What happens during an annual well check? The services and screenings done by your health care provider during your annual well check will depend on your age, overall health, lifestyle risk factors, and family history of disease. Counseling  Your health care provider may ask you questions about your: Alcohol use. Tobacco use. Drug use. Emotional well-being. Home and relationship well-being. Sexual activity. Eating habits. History of falls. Memory and ability to understand (cognition). Work and work Astronomer. Reproductive health. Screening  You may have the following tests or measurements: Height, weight, and BMI. Blood pressure. Lipid and cholesterol levels. These may be checked every 5 years, or more frequently if you are over 34 years old. Skin check. Lung cancer screening. You may have this screening every year starting at age 57 if you have a 30-pack-year history of smoking and currently smoke or have quit within the past 15 years. Fecal occult blood test (FOBT) of the stool. You may have this test every year starting at age 66. Flexible sigmoidoscopy or colonoscopy. You may have a sigmoidoscopy every 5 years or a colonoscopy every 10 years starting at age 68. Hepatitis C blood test. Hepatitis B blood test. Sexually transmitted disease (STD) testing. Diabetes screening. This is done by checking your blood sugar (glucose)  after you have not eaten for a while (fasting). You may have this done every 1-3 years. Bone density scan. This is done to screen for osteoporosis. You may have this done starting at age 28. Mammogram. This may be done every 1-2 years. Talk to your health care provider about how often you should have regular mammograms. Talk with your health care provider about  your test results, treatment options, and if necessary, the need for more tests. Vaccines  Your health care provider may recommend certain vaccines, such as: Influenza vaccine. This is recommended every year. Tetanus, diphtheria, and acellular pertussis (Tdap, Td) vaccine. You may need a Td booster every 10 years. Zoster vaccine. You may need this after age 20. Pneumococcal 13-valent conjugate (PCV13) vaccine. One dose is recommended after age 13. Pneumococcal polysaccharide (PPSV23) vaccine. One dose is recommended after age 43. Talk to your health care provider about which screenings and vaccines you need and how often you need them. This information is not intended to replace advice given to you by your health care provider. Make sure you discuss any questions you have with your health care provider. Document Released: 10/13/2015 Document Revised: 06/05/2016 Document Reviewed: 07/18/2015 Elsevier Interactive Patient Education  2017 [employer].  Fall Prevention in the Home Falls can cause injuries. They can happen to people of all ages. There are many things you can do to make your home safe and to help prevent falls. What can I do on the outside of my home? Regularly fix the edges of walkways and driveways and fix any cracks. Remove anything that might make you trip as you walk through a door, such as a raised step or threshold. Trim any bushes or trees on the path to your home. Use bright outdoor lighting. Clear any walking paths of anything that might make someone trip, such as rocks or tools. Regularly check to see if handrails are loose or broken. Make sure that both sides of any steps have handrails. Any raised decks and porches should have guardrails on the edges. Have any leaves, snow, or ice cleared regularly. Use sand or salt on walking paths during winter. Clean up any spills in your garage right away. This includes oil or grease spills. What can I do in the bathroom? Use  night lights. Install grab bars by the toilet and in the tub and shower. Do not use towel bars as grab bars. Use non-skid mats or decals in the tub or shower. If you need to sit down in the shower, use a plastic, non-slip stool. Keep the floor dry. Clean up any water that spills on the floor as soon as it happens. Remove soap buildup in the tub or shower regularly. Attach bath mats securely with double-sided non-slip rug tape. Do not have throw rugs and other things on the floor that can make you trip. What can I do in the bedroom? Use night lights. Make sure that you have a light by your bed that is easy to reach. Do not use any sheets or blankets that are too big for your bed. They should not hang down onto the floor. Have a firm chair that has side arms. You can use this for support while you get dressed. Do not have throw rugs and other things on the floor that can make you trip. What can I do in the kitchen? Clean up any spills right away. Avoid walking on wet floors. Keep items that you use a lot in easy-to-reach places. If  you need to reach something above you, use a strong step stool that has a grab bar. Keep electrical cords out of the way. Do not use floor polish or wax that makes floors slippery. If you must use wax, use non-skid floor wax. Do not have throw rugs and other things on the floor that can make you trip. What can I do with my stairs? Do not leave any items on the stairs. Make sure that there are handrails on both sides of the stairs and use them. Fix handrails that are broken or loose. Make sure that handrails are as long as the stairways. Check any carpeting to make sure that it is firmly attached to the stairs. Fix any carpet that is loose or worn. Avoid having throw rugs at the top or bottom of the stairs. If you do have throw rugs, attach them to the floor with carpet tape. Make sure that you have a light switch at the top of the stairs and the bottom of the  stairs. If you do not have them, ask someone to add them for you. What else can I do to help prevent falls? Wear shoes that: Do not have high heels. Have rubber bottoms. Are comfortable and fit you well. Are closed at the toe. Do not wear sandals. If you use a stepladder: Make sure that it is fully opened. Do not climb a closed stepladder. Make sure that both sides of the stepladder are locked into place. Ask someone to hold it for you, if possible. Clearly mark and make sure that you can see: Any grab bars or handrails. First and last steps. Where the edge of each step is. Use tools that help you move around (mobility aids) if they are needed. These include: Canes. Walkers. Scooters. Crutches. Turn on the lights when you go into a dark area. Replace any light bulbs as soon as they burn out. Set up your furniture so you have a clear path. Avoid moving your furniture around. If any of your floors are uneven, fix them. If there are any pets around you, be aware of where they are. Review your medicines with your doctor. Some medicines can make you feel dizzy. This can increase your chance of falling. Ask your doctor what other things that you can do to help prevent falls. This information is not intended to replace advice given to you by your health care provider. Make sure you discuss any questions you have with your health care provider. Document Released: 07/13/2009 Document Revised: 02/22/2016 Document Reviewed: 10/21/2014 Elsevier Interactive Patient Education  2017 [employer].

## 2023-04-01 ENCOUNTER — Other Ambulatory Visit (HOSPITAL_BASED_OUTPATIENT_CLINIC_OR_DEPARTMENT_OTHER): Payer: Self-pay | Admitting: Cardiovascular Disease

## 2023-04-02 NOTE — Telephone Encounter (Signed)
Rx request sent to pharmacy.  

## 2023-04-07 ENCOUNTER — Ambulatory Visit (INDEPENDENT_AMBULATORY_CARE_PROVIDER_SITE_OTHER): Payer: Medicare Other | Admitting: Internal Medicine

## 2023-04-07 ENCOUNTER — Ambulatory Visit (INDEPENDENT_AMBULATORY_CARE_PROVIDER_SITE_OTHER): Payer: Medicare Other

## 2023-04-07 ENCOUNTER — Encounter: Payer: Self-pay | Admitting: Internal Medicine

## 2023-04-07 VITALS — BP 110/70 | HR 70 | Temp 98.2°F | Wt 172.0 lb

## 2023-04-07 DIAGNOSIS — Z0000 Encounter for general adult medical examination without abnormal findings: Secondary | ICD-10-CM | POA: Diagnosis not present

## 2023-04-07 DIAGNOSIS — G8929 Other chronic pain: Secondary | ICD-10-CM | POA: Diagnosis not present

## 2023-04-07 DIAGNOSIS — M5442 Lumbago with sciatica, left side: Secondary | ICD-10-CM

## 2023-04-07 DIAGNOSIS — I10 Essential (primary) hypertension: Secondary | ICD-10-CM | POA: Diagnosis not present

## 2023-04-07 DIAGNOSIS — K649 Unspecified hemorrhoids: Secondary | ICD-10-CM

## 2023-04-07 DIAGNOSIS — M47816 Spondylosis without myelopathy or radiculopathy, lumbar region: Secondary | ICD-10-CM | POA: Diagnosis not present

## 2023-04-07 DIAGNOSIS — M545 Low back pain, unspecified: Secondary | ICD-10-CM | POA: Diagnosis not present

## 2023-04-07 LAB — URINALYSIS
Bilirubin Urine: NEGATIVE
Hgb urine dipstick: NEGATIVE
Ketones, ur: NEGATIVE
Leukocytes,Ua: NEGATIVE
Nitrite: NEGATIVE
Specific Gravity, Urine: <=1.005 — AB (ref 1.000–1.030)
Total Protein, Urine: NEGATIVE
Urine Glucose: NEGATIVE
Urobilinogen, UA: 0.2 (ref 0.0–1.0)
pH: 6.5 (ref 5.0–8.0)

## 2023-04-07 LAB — COMPREHENSIVE METABOLIC PANEL
ALT: 20 U/L (ref 0–35)
AST: 17 U/L (ref 0–37)
Albumin: 4.1 g/dL (ref 3.5–5.2)
Alkaline Phosphatase: 43 U/L (ref 39–117)
BUN: 20 mg/dL (ref 6–23)
CO2: 30 mEq/L (ref 19–32)
Calcium: 10.3 mg/dL (ref 8.4–10.5)
Chloride: 101 mEq/L (ref 96–112)
Creatinine, Ser: 1 mg/dL (ref 0.40–1.20)
GFR: 54.6 mL/min — ABNORMAL LOW (ref >60.00)
Glucose, Bld: 105 mg/dL — ABNORMAL HIGH (ref 70–99)
Potassium: 4 mEq/L (ref 3.5–5.1)
Sodium: 135 mEq/L (ref 135–145)
Total Bilirubin: 0.5 mg/dL (ref 0.2–1.2)
Total Protein: 7.7 g/dL (ref 6.0–8.3)

## 2023-04-07 LAB — CBC WITH DIFFERENTIAL/PLATELET
Basophils Absolute: 0 10*3/uL (ref 0.0–0.1)
Basophils Relative: 0.5 % (ref 0.0–3.0)
Eosinophils Absolute: 0.3 10*3/uL (ref 0.0–0.7)
Eosinophils Relative: 6.1 % — ABNORMAL HIGH (ref 0.0–5.0)
HCT: 36.5 % (ref 36.0–46.0)
Hemoglobin: 12.2 g/dL (ref 12.0–15.0)
Lymphocytes Relative: 22.5 % (ref 12.0–46.0)
Lymphs Abs: 1 10*3/uL (ref 0.7–4.0)
MCHC: 33.5 g/dL (ref 30.0–36.0)
MCV: 98.9 fl (ref 78.0–100.0)
Monocytes Absolute: 0.4 10*3/uL (ref 0.1–1.0)
Monocytes Relative: 9.3 % (ref 3.0–12.0)
Neutro Abs: 2.6 10*3/uL (ref 1.4–7.7)
Neutrophils Relative %: 61.6 % (ref 43.0–77.0)
Platelets: 219 10*3/uL (ref 150.0–400.0)
RBC: 3.69 Mil/uL — ABNORMAL LOW (ref 3.87–5.11)
RDW: 12.3 % (ref 11.5–15.5)
WBC: 4.3 10*3/uL (ref 4.0–10.5)

## 2023-04-07 LAB — LIPID PANEL
Cholesterol: 163 mg/dL (ref 0–200)
HDL: 36.1 mg/dL — ABNORMAL LOW (ref >39.00)
LDL Cholesterol: 94 mg/dL (ref 0–99)
NonHDL: 126.49
Total CHOL/HDL Ratio: 5
Triglycerides: 162 mg/dL — ABNORMAL HIGH (ref 0.0–149.0)
VLDL: 32.4 mg/dL (ref 0.0–40.0)

## 2023-04-07 LAB — SEDIMENTATION RATE: Sed Rate: 31 mm/hr — ABNORMAL HIGH (ref 0–30)

## 2023-04-07 LAB — TSH: TSH: 1.38 u[IU]/mL (ref 0.35–5.50)

## 2023-04-07 LAB — MAGNESIUM: Magnesium: 2.1 mg/dL (ref 1.5–2.5)

## 2023-04-07 MED ORDER — TRIAMCINOLONE ACETONIDE 0.1 % EX OINT: 1.0000 | TOPICAL_OINTMENT | Freq: Two times a day (BID) | CUTANEOUS | 3 refills | Status: DC

## 2023-04-07 NOTE — Assessment & Plan Note (Signed)
Worse R/o compression fracture - X ray Labs/UA Tylenol prn

## 2023-04-07 NOTE — Assessment & Plan Note (Signed)
Much better 

## 2023-04-07 NOTE — Assessment & Plan Note (Signed)
Last colon 2022 Chronic, recurrent Triamc oint prn

## 2023-04-07 NOTE — Assessment & Plan Note (Signed)
  We discussed age appropriate health related issues, including available/recomended screening tests and vaccinations. Labs were ordered to be later reviewed . All questions were answered. We discussed one or more of the following - seat belt use, use of sunscreen/sun exposure exercise, fall risk reduction, second hand smoke exposure, firearm use and storage, seat belt use, a need for adhering to healthy diet and exercise. Labs were ordered.  All questions were answered. Last colon/EGD 2022 - Dr Perry, no need to repeat PAP/DEXA/mammo with Dr Grewal  

## 2023-04-07 NOTE — Progress Notes (Signed)
Subjective:  Patient ID: Kelly Santos, female    DOB: 1946-09-27  Age: 77 y.o. MRN: [id number]  CC: Annual Exam (Lower back pain radiating around waist line like a belt)   HPI Kelly Santos presents for a well exam C/o lower thoracic pain upper LBP of a sudden onset in the shower. It started 2 months ago. It was sharp. Pain is getting better some. Moderate pain at times...  Outpatient Medications Prior to Visit  Medication Sig Dispense Refill   amitriptyline (ELAVIL) 25 MG tablet TAKE 1 TABLET (25 MG TOTAL) BY MOUTH AT BEDTIME AS NEEDED FOR SLEEP. DUE FOR ROUTINE OV 90 tablet 1   brimonidine (ALPHAGAN) 0.2 % ophthalmic solution SMARTSIG:1 Drop(s) In Eye(s) Every 12 Hours     candesartan (ATACAND) 32 MG tablet Take 1 tablet (32 mg total) by mouth daily. 90 tablet 3   cholecalciferol (VITAMIN D) 1000 UNITS tablet Take 1,000 Units by mouth every other day.      COMBIGAN 0.2-0.5 % ophthalmic solution INSTILL 1 DROP INTO BOTH EYES TWICE A DAY 5 mL 2   Ferrous Sulfate (IRON) 325 (65 FE) MG TABS Take 1 tablet by mouth 3 (three) times a week.      hydrochlorothiazide (HYDRODIURIL) 12.5 MG tablet TAKE 1 TABLET BY MOUTH DAILY. CALL OFFICE FOR APPOINTMENT, NO MORE REFILLS UNTIL SEEN 90 tablet 3   hydrocortisone (PROCTOCORT) 1 % CREA Apply 1 application topically 2 (two) times daily as needed (dry skin).      ketoconazole (NIZORAL) 2 % cream Apply 1 Application topically daily. 45 g 1   pantoprazole (PROTONIX) 40 MG tablet Take 1 tablet (40 mg total) by mouth daily. 90 tablet 3   spironolactone (ALDACTONE) 25 MG tablet TAKE 1 TABLET BY MOUTH EVERY DAY 90 tablet 3   spironolactone (ALDACTONE) 50 MG tablet Take 1 tablet (50 mg total) by mouth daily. Please keep your upcoming appointment for refills. 100 tablet 0   timolol (TIMOPTIC) 0.5 % ophthalmic solution 1 drop every morning.     triamcinolone cream (KENALOG) 0.5 % APPLY TOPICALLY 2 TIMES DAILY AS NEEDED. 60 g 2   No facility-administered  medications prior to visit.    ROS: Review of Systems  Constitutional:  Negative for activity change, appetite change, chills, fatigue and unexpected weight change.  HENT:  Negative for congestion, mouth sores and sinus pressure.   Eyes:  Negative for visual disturbance.  Respiratory:  Negative for cough and chest tightness.   Gastrointestinal:  Negative for abdominal pain and nausea.  Genitourinary:  Negative for difficulty urinating, frequency and vaginal pain.  Musculoskeletal:  Positive for back pain. Negative for gait problem.  Skin:  Negative for pallor and rash.  Neurological:  Negative for dizziness, tremors, weakness, numbness and headaches.  Psychiatric/Behavioral:  Negative for confusion and sleep disturbance.     Objective:  BP 110/70 (BP Location: Right Arm, Patient Position: Sitting, Cuff Size: Large)   Pulse 70   Temp 98.2 F (36.8 C) (Oral)   Wt 172 lb (78 kg)   SpO2 99%   BMI 30.47 kg/m   BP Readings from Last 3 Encounters:  04/07/23 110/70  08/15/22 130/80  04/03/22 130/82    Wt Readings from Last 3 Encounters:  04/07/23 172 lb (78 kg)  03/13/23 170 lb (77.1 kg)  08/15/22 168 lb (76.2 kg)    Physical Exam Constitutional:      General: She is not in acute distress.    Appearance: Normal appearance. She  is well-developed.  HENT:     Head: Normocephalic.     Right Ear: External ear normal.     Left Ear: External ear normal.     Nose: Nose normal.  Eyes:     General:        Right eye: No discharge.        Left eye: No discharge.     Conjunctiva/sclera: Conjunctivae normal.     Pupils: Pupils are equal, round, and reactive to light.  Neck:     Thyroid: No thyromegaly.     Vascular: No JVD.     Trachea: No tracheal deviation.  Cardiovascular:     Rate and Rhythm: Normal rate and regular rhythm.     Heart sounds: Normal heart sounds.  Pulmonary:     Effort: No respiratory distress.     Breath sounds: No stridor. No wheezing.  Abdominal:      General: Bowel sounds are normal. There is no distension.     Palpations: Abdomen is soft. There is no mass.     Tenderness: There is no abdominal tenderness. There is no guarding or rebound.  Musculoskeletal:        General: No tenderness.     Cervical back: Normal range of motion and neck supple. No rigidity.  Lymphadenopathy:     Cervical: No cervical adenopathy.  Skin:    Findings: No erythema or rash.  Neurological:     Cranial Nerves: No cranial nerve deficit.     Motor: No abnormal muscle tone.     Coordination: Coordination normal.     Deep Tendon Reflexes: Reflexes normal.  Psychiatric:        Behavior: Behavior normal.        Thought Content: Thought content normal.        Judgment: Judgment normal.     Lab Results  Component Value Date   WBC 3.2 (L) 08/15/2022   HGB 11.6 (L) 08/15/2022   HCT 33.5 (L) 08/15/2022   PLT 215.0 08/15/2022   GLUCOSE 86 08/15/2022   CHOL 161 04/03/2022   TRIG 99.0 04/03/2022   HDL 42.30 04/03/2022   LDLCALC 99 04/03/2022   ALT 16 08/15/2022   AST 19 08/15/2022   NA 136 08/15/2022   K 4.0 08/15/2022   CL 104 08/15/2022   CREATININE 0.88 08/15/2022   BUN 18 08/15/2022   CO2 28 08/15/2022   TSH 1.24 08/15/2022   INR 0.96 05/09/2011   HGBA1C 6.1 08/15/2022    VAS [place] LOWER EXTREMITY VENOUS (DVT)  Result Date: 06/14/2021  Lower Venous Reflux Study Patient Name:  Kelly Santos  Date of Exam:   06/14/2021 Medical Rec #: [id number]      Accession #:    [id number] Date of Birth: 04-10-1946       Patient Gender: F Patient Age:   28 years Exam Location:  [facility] Procedure:      VAS [place] LOWER EXTREMITY VENOUS REFLUX Referring Phys: Kennyth Arnold BURNS --------------------------------------------------------------------------------  Indications: Left lower extremity pain and swelling. Study ordered as a rule out DVT exam which was performed in conjunction with a left leg reflux exam.  Risk Factors: Significant varicosities of the left  lower extremity. Performing Technologist: Dorthula Matas RVS, RCS  Examination Guidelines: A complete evaluation includes B-mode imaging, spectral Doppler, color Doppler, and power Doppler as needed of all accessible portions of each vessel. Bilateral testing is considered an integral part of a complete examination. Limited examinations for reoccurring indications  may be performed as noted. The reflux portion of the exam is performed with the patient in reverse Trendelenburg. Significant venous reflux is defined as >500 ms in the superficial venous system, and >1 second in the deep venous system.  +--------------+---------+------+-----------+------------+--------+ LEFT          Reflux NoRefluxReflux TimeDiameter cmsComments                         Yes                                  +--------------+---------+------+-----------+------------+--------+ CFV                     yes   >1 second                      +--------------+---------+------+-----------+------------+--------+ FV mid                  yes   >1 second                      +--------------+---------+------+-----------+------------+--------+ Popliteal     no                                             +--------------+---------+------+-----------+------------+--------+ GSV at SFJ              yes    >500 ms      1.25             +--------------+---------+------+-----------+------------+--------+ GSV prox thigh          yes    >500 ms      0.69             +--------------+---------+------+-----------+------------+--------+ GSV mid thigh           yes    >500 ms      0.72             +--------------+---------+------+-----------+------------+--------+ GSV dist thigh          yes    >500 ms      0.68             +--------------+---------+------+-----------+------------+--------+ GSV at knee             yes    >500 ms      0.65              +--------------+---------+------+-----------+------------+--------+ GSV prox calf           yes    >500 ms      0.70             +--------------+---------+------+-----------+------------+--------+ SSV Pop Fossa no                            0.19             +--------------+---------+------+-----------+------------+--------+  Findings attempted to be reported to Dr. Cheryll Cockayne office at 11:25 am. No answer. Results will be routed in epic.  Summary: Left: - No evidence of deep vein thrombosis from the common femoral through the popliteal veins. - No evidence of superficial venous thrombosis. - The deep venous system is not competent. - The great saphenous vein is not competent. -  The small saphenous vein is competent.  *See table(s) above for measurements and observations. Electronically signed by Heath Lark on 06/14/2021 at 1:34:39 PM.    Final     Assessment & Plan:   Problem List Items Addressed This Visit     Hypertension, uncontrolled    Much better      Relevant Orders   TSH   Urinalysis   CBC with Differential/Platelet   Lipid panel   Comprehensive metabolic panel   Magnesium   Hemorrhoid    Last colon 2022 Chronic, recurrent Triamc oint prn      Relevant Orders   Magnesium   Low back pain    Worse R/o compression fracture - X ray Labs/UA Tylenol prn      Relevant Orders   Magnesium   Sedimentation rate   DG Lumbar Spine 2-3 Views   Well adult exam - Primary    We discussed age appropriate health related issues, including available/recomended screening tests and vaccinations. Labs were ordered to be later reviewed . All questions were answered. We discussed one or more of the following - seat belt use, use of sunscreen/sun exposure exercise, fall risk reduction, second hand smoke exposure, firearm use and storage, seat belt use, a need for adhering to healthy diet and exercise. Labs were ordered.  All questions were answered. Last colon/EGD 2022 - Dr  Marina Goodell, no need to repeat PAP/DEXA/mammo with Dr Vincente Poli      Relevant Orders   TSH   Urinalysis   CBC with Differential/Platelet   Lipid panel   Comprehensive metabolic panel      Meds ordered this encounter  Medications   triamcinolone ointment (KENALOG) 0.1 %    Sig: Apply 1 Application topically 2 (two) times daily.    Dispense:  160 g    Refill:  3      Follow-up: Return in about 4 weeks (around 05/05/2023) for a follow-up visit.  Sonda Primes, MD

## 2023-04-10 ENCOUNTER — Telehealth: Payer: Self-pay | Admitting: Internal Medicine

## 2023-04-10 NOTE — Telephone Encounter (Signed)
Called pt back inform her MD is still waiting on report for xray. Can take up to 1-5 days to et back. Pt had questions concerning her labs went over her cholesterol and GFR. Pt verbalize she understood. Inform MD will leave mychart msg on xray once it comes back...Raechel Chute

## 2023-04-10 NOTE — Telephone Encounter (Signed)
Pt wants a call back about her Xray results because she is still in pain and not understanding what is really going on. Pt is really concerned.Please advise.

## 2023-04-14 NOTE — Telephone Encounter (Signed)
Patient called and said she saw her x-ray results, but did not understand them. She would like for a nurse to call her back to go over the results. Best callback is [phone].

## 2023-04-15 DIAGNOSIS — H401122 Primary open-angle glaucoma, left eye, moderate stage: Secondary | ICD-10-CM | POA: Diagnosis not present

## 2023-04-15 DIAGNOSIS — H401111 Primary open-angle glaucoma, right eye, mild stage: Secondary | ICD-10-CM | POA: Diagnosis not present

## 2023-04-15 NOTE — Telephone Encounter (Signed)
Called pt went over spine xray with pt. She verbalize understands now.Marland KitchenRaechel Chute

## 2023-05-08 ENCOUNTER — Encounter (HOSPITAL_BASED_OUTPATIENT_CLINIC_OR_DEPARTMENT_OTHER): Payer: Medicare Other | Admitting: Cardiovascular Disease

## 2023-05-08 NOTE — Progress Notes (Deleted)
[facility] Initial Assessment:    Date:  05/08/2023   ID:  Kelly Santos, DOB 05/27/1946, MRN [id number]  PCP:  Tresa Garter, MD  Cardiologist:  None  Nephrologist:  Referring MD: Tresa Garter, MD   CC: Hypertension  History of Present Illness:    Kelly Santos is a 77 y.o. female with a hx of coronary artery disease, hypertension, hyperthyroidism, GERD, anemia, and glaucoma, here for follow-up.  She first establish care in the [facility] 10/2021. She previously had a left heart cath in 2012 with trivial CAD and normal EF. Nuclear stress in 2015 and Echo in 2017 were unremarkable. She was last seen in cardiology by Dr. Anne Fu on 02/28/2017 and was doing well. Her blood pressure was stable on spironolactone. Ms. Manfred saw her PCP 10/09/2021 and her BP was 162/90. She reported readings of 160-170 in the AM and 130-146 in the PM at home. She was instructed to continue with candesartan and spironolactone in the morning, add 10 mg bystolic at hs starting with 1/2 a day, and use catapress as needed. She was referred to the [facility] clinic.  At her initial visit she struggled with chronic pain.  She was also taking clonidine inconsistently.  She was exercising regularly and using ibuprofen at times.  She had not tolerated HCTZ in the past due to hypokalemia.  Given that she was now on spironolactone, the spironolactone was increased and HCTZ was added back at a low dose.  We recommended not taking clonidine sporadically.  She followed up with our pharmacist at home blood pressures were averaging in the 120s though her office blood pressure was 152/101.  She was thought to have whitecoat hypertension superimposed on essential hypertension.    Previous antihypertensives: Amlodipine-edema HCTZ-hypokalemia Metoprolol-fatigue Carvedilol Clonidine (rarely used)  Past Medical History:  Diagnosis Date   Anemia    Aortic atherosclerosis (HCC)  11/07/2021   CAD (coronary artery disease)    no cardiologist, followed by PCP only   Cataract    bil cataracts removed   Diverticulosis    Generalized headaches    GERD (gastroesophageal reflux disease)    Glaucoma    Hemorrhoids    Hiatal hernia    On CT done Jan 31, 2011    Hx of colonic polyps    Hypertension    Hyperthyroidism    Osteoarthritis    Uterine polyp    Vitamin D deficiency     Past Surgical History:  Procedure Laterality Date   ABDOMINAL HYSTERECTOMY N/A 04/26/2013   Procedure: HYSTERECTOMY ABDOMINAL;  Surgeon: Jeani Hawking, MD;  Location: [facility];  Service: Gynecology;  Laterality: N/A;   COLONOSCOPY     DILATION AND CURETTAGE OF UTERUS  01/18/2013   with uterine polypectomy   SALPINGOOPHORECTOMY Bilateral 04/26/2013   Procedure: SALPINGO OOPHORECTOMY;  Surgeon: Jeani Hawking, MD;  Location: [facility];  Service: Gynecology;  Laterality: Bilateral;   UPPER GASTROINTESTINAL ENDOSCOPY      Current Medications: No outpatient medications have been marked as taking for the 05/08/23 encounter (Appointment) with Chilton Si, MD.     Allergies:   Amlodipine, Hctz [hydrochlorothiazide], and Metoprolol   Social History   Socioeconomic History   Marital status: Divorced    Spouse name: n/a   Number of children: 3   Years of education: Not on file   Highest education level: Not on file  Occupational History   Occupation: CNA    Employer: [employer]    Comment: [employer]  Tobacco Use   Smoking status: Never   Smokeless tobacco: Never  Vaping Use   Vaping status: Never Used  Substance and Sexual Activity   Alcohol use: No    Alcohol/week: 0.0 standard drinks of alcohol   Drug use: No   Sexual activity: Yes  Other Topics Concern   Not on file  Social History Narrative   Lives with her daughter and granddaughter.   Social Determinants of Health   Financial Resource Strain: Low Risk  (03/13/2023)   Overall Financial Resource  Strain (CARDIA)    Difficulty of Paying Living Expenses: Not very hard  Food Insecurity: No Food Insecurity (03/13/2023)   Hunger Vital Sign    Worried About Running Out of Food in the Last Year: Never true    Ran Out of Food in the Last Year: Never true  Transportation Needs: No Transportation Needs (03/13/2023)   PRAPARE - Administrator, Civil Service (Medical): No    Lack of Transportation (Non-Medical): No  Physical Activity: Sufficiently Active (03/13/2023)   Exercise Vital Sign    Days of Exercise per Week: 5 days    Minutes of Exercise per Session: 30 min  Stress: No Stress Concern Present (03/13/2023)   [employer] of Occupational Health - Occupational Stress Questionnaire    Feeling of Stress : Not at all  Social Connections: Moderately Isolated (03/13/2023)   Social Connection and Isolation Panel [NHANES]    Frequency of Communication with Friends and Family: More than three times a week    Frequency of Social Gatherings with Friends and Family: Three times a week    Attends Religious Services: More than 4 times per year    Active Member of Clubs or Organizations: No    Attends Banker Meetings: Never    Marital Status: Divorced     Family History: The patient's family history includes CVA in her brother; Deep vein thrombosis (age of onset: 26) in her son; Diabetes in her brother; Heart failure in her father; Hypertension in her brother, father, mother, and sister. There is no history of Colon cancer, Esophageal cancer, Stomach cancer, or Rectal cancer.  ROS:   Please see the history of present illness.    (+) Right-sided pain of neck, shoulder, and RUE. (+) Acid reflux (+) Snoring All other systems reviewed and are negative.  EKGs/Labs/Other Studies Reviewed:    LLE Venous Doppler 06/14/2021: Summary:  Left:  - No evidence of deep vein thrombosis from the common femoral through the  popliteal veins.  - No evidence of superficial venous  thrombosis.  - The deep venous system is not competent.  - The great saphenous vein is not competent.  - The small saphenous vein is competent.   CTA Chest/abdomen/pelvis 06/06/2020: Vascular/Lymphatic: Atherosclerotic calcification is present within the non-aneurysmal abdominal aorta, without hemodynamically significant stenosis. There is a large, partially visualized left great saphenous vein. This appears relatively stable since 2019.   IMPRESSION: 1. No acute thoracic, abdominal or pelvic pathology. Specifically, no pulmonary embolus. 2. Small hiatal hernia. 3. Colonic diverticulosis without CT evidence for diverticulitis.   Aortic Atherosclerosis (ICD10-I70.0).  Exercise Myoview 02/10/2017: Nuclear stress EF: 64%. Normal wall motion Downsloping ST segment depression ST segment depression of 2 mm was noted during stress in the II, III, aVF and V5 leads. This is a low risk perfusion study. Normal perfusion pattern would suggest false positive exercise treadmill test results. There was no transient  ischemic dilatation. Similar findings were noted on prior nuclear stress test 2015.  EKG:   11/07/2021: Sinus bradycardia. Rate 58 bpm. 1st degree AV block.  Recent Labs: 04/07/2023: ALT 20; BUN 20; Creatinine, Ser 1.00; Hemoglobin 12.2; Magnesium 2.1; Platelets 219.0; Potassium 4.0; Sodium 135; TSH 1.38   Recent Lipid Panel    Component Value Date/Time   CHOL 163 04/07/2023 0927   CHOL 156 01/23/2017 1201   TRIG 162.0 (H) 04/07/2023 0927   HDL 36.10 (L) 04/07/2023 0927   HDL 55 01/23/2017 1201   CHOLHDL 5 04/07/2023 0927   VLDL 32.4 04/07/2023 0927   LDLCALC 94 04/07/2023 0927   LDLCALC 84 01/23/2017 1201    Physical Exam:    VS:  There were no vitals taken for this visit. , BMI There is no height or weight on file to calculate BMI. GENERAL:  Well appearing HEENT: Pupils equal round and reactive, fundi not visualized, oral mucosa unremarkable NECK:  No jugular venous  distention, waveform within normal limits, carotid upstroke brisk and symmetric, no bruits, no thyromegaly LUNGS:  Clear to auscultation bilaterally HEART:  RRR.  PMI not displaced or sustained,S1 and S2 within normal limits, no S3, no S4, no clicks, no rubs, no murmurs ABD:  Flat, positive bowel sounds normal in frequency in pitch, no bruits, no rebound, no guarding, no midline pulsatile mass, no hepatomegaly, no splenomegaly EXT:  2 plus pulses throughout, no edema, no cyanosis no clubbing SKIN:  No rashes no nodules NEURO:  Cranial nerves II through XII grossly intact, motor grossly intact throughout PSYCH:  Cognitively intact, oriented to person place and time   ASSESSMENT/PLAN:    No problem-specific Assessment & Plan notes found for this encounter.    Screening for Secondary Hypertension:     11/07/2021    8:34 AM  Causes  Drugs/Herbals Screened     - Comments limiting sodium intake, rare caffeine, no EtOH, rare NSAIDs  Renovascular HTN Screened     - Comments Check renal artery Dopplers  Sleep Apnea Screened     - Comments snoring but no other symptoms  Thyroid Disease Screened  Hyperaldosteronism Screened     - Comments No adenomas on CT in 2021  Pheochromocytoma Screened     - Comments Check catecholamines metanephrines  Cushing's Syndrome N/A  Hyperparathyroidism Screened  Coarctation of the Aorta Screened     - Comments BP symmetric  Compliance Screened     - Comments Verified with pharmacy    Relevant Labs/Studies:    Latest Ref Rng & Units 04/07/2023    9:27 AM 08/15/2022   10:35 AM 04/03/2022    9:19 AM  Basic Labs  Sodium 135 - 145 mEq/L 135  136  139   Potassium 3.5 - 5.1 mEq/L 4.0  4.0  4.4   Creatinine 0.40 - 1.20 mg/dL 4.09  8.11  9.14        Latest Ref Rng & Units 04/07/2023    9:27 AM 08/15/2022   10:35 AM  Thyroid   TSH 0.35 - 5.50 uIU/mL 1.38  1.24        Latest Ref Rng & Units 01/23/2017   12:01 PM  Renin/Aldosterone   Aldosterone 0.0 - 30.0  ng/dL 78.2   Renin 9.562 - 1.308 ng/mL/hr <0.167   Aldos/Renin Ratio 0.0 - 30.0 >122.2        Latest Ref Rng & Units 11/13/2021    9:09 AM  Metanephrines/Catecholamines   Epinephrine 0 - 62 pg/mL 39  Norepinephrine 0 - 874 pg/mL 581   Dopamine 0 - 48 pg/mL <30   Metanephrines 0.0 - 88.0 pg/mL 34.4   Normetanephrines  0.0 - 285.2 pg/mL 60.9        Latest Ref Rng & Units 11/02/2015   10:15 AM  Cortisol  Cortisol  ug/dL 6.8       Disposition:    FU with APP/PharmD in 1 month for the next 3 months.   FU with  C. Duke Salvia, MD, [facility] in 4 months.   Medication Adjustments/Labs and Tests Ordered: Current medicines are reviewed at length with the patient today.  Concerns regarding medicines are outlined above.   No orders of the defined types were placed in this encounter.  No orders of the defined types were placed in this encounter.  I,Mathew Stumpf,acting as a Neurosurgeon for Chilton Si, MD.,have documented all relevant documentation on the behalf of Chilton Si, MD,as directed by  Chilton Si, MD while in the presence of Chilton Si, MD.  I,  C. Duke Salvia, MD have reviewed all documentation for this visit.  The documentation of the exam, diagnosis, procedures, and orders on 05/08/2023 are all accurate and complete.   Signed, Chilton Si, MD  05/08/2023 8:04 AM    [facility]

## 2023-05-13 ENCOUNTER — Other Ambulatory Visit: Payer: Self-pay | Admitting: Internal Medicine

## 2023-05-19 ENCOUNTER — Other Ambulatory Visit: Payer: Self-pay | Admitting: Internal Medicine

## 2023-05-29 DIAGNOSIS — H00022 Hordeolum internum right lower eyelid: Secondary | ICD-10-CM | POA: Diagnosis not present

## 2023-06-10 ENCOUNTER — Other Ambulatory Visit (HOSPITAL_BASED_OUTPATIENT_CLINIC_OR_DEPARTMENT_OTHER): Payer: Self-pay | Admitting: Cardiovascular Disease

## 2023-06-29 ENCOUNTER — Other Ambulatory Visit (HOSPITAL_BASED_OUTPATIENT_CLINIC_OR_DEPARTMENT_OTHER): Payer: Self-pay | Admitting: Cardiovascular Disease

## 2023-07-02 ENCOUNTER — Ambulatory Visit (HOSPITAL_BASED_OUTPATIENT_CLINIC_OR_DEPARTMENT_OTHER): Payer: Medicare Other | Admitting: Cardiovascular Disease

## 2023-07-02 ENCOUNTER — Encounter (HOSPITAL_BASED_OUTPATIENT_CLINIC_OR_DEPARTMENT_OTHER): Payer: Self-pay | Admitting: Cardiovascular Disease

## 2023-07-02 VITALS — BP 106/66 | HR 64 | Ht 63.0 in | Wt 174.7 lb

## 2023-07-02 DIAGNOSIS — I5189 Other ill-defined heart diseases: Secondary | ICD-10-CM | POA: Diagnosis not present

## 2023-07-02 DIAGNOSIS — I700 Atherosclerosis of aorta: Secondary | ICD-10-CM | POA: Diagnosis not present

## 2023-07-02 DIAGNOSIS — I10 Essential (primary) hypertension: Secondary | ICD-10-CM

## 2023-07-02 NOTE — Progress Notes (Deleted)
Advanced Hypertension Clinic Initial Assessment:    Date:  07/02/2023   ID:  Kelly Santos, DOB 09-08-1946, MRN [id number]  PCP:  Tresa Garter, MD  Cardiologist:  None  Nephrologist:  Referring MD: Tresa Garter, MD   CC: Hypertension  History of Present Illness:    Kelly Santos is a 77 y.o. female with a hx of coronary artery disease, hypertension, hyperthyroidism, GERD, esophageal stricture, anemia, and glaucoma, here to establish care in the Advanced [facility]pertension Clinic. She previously had a left heart cath in 2012 with trivial CAD and normal EF. Nuclear stress in 2015 and Echo in 2017 were unremarkable. She was last seen in cardiology by Dr. Anne Fu on 02/28/2017 and was doing well. Her blood pressure was stable on spironolactone. Ms. Olenik saw her PCP 10/09/2021 and her BP was 162/90. She reported readings of 160-170 in the AM and 130-146 in the PM at home. She was instructed to continue with candesartan and spironolactone in the morning, add 10 mg bystolic at hs starting with 1/2 a day, and use catapress as needed. She was referred to the Advanced Hypertension clinic.  At her visit 10/2021 blood pressures remained uncontrolled.  She was walking and plan to increase her exercise.  Spironolactone was increased to 50 mg and HCTZ 12.5 mg was added.  She followed up with our pharmacist 11/2021 and blood pressure was much better controlled at home, averaging in the 120s.  It was in the 150s in the office suggestive of whitecoat hypertension superimposed on essential hypertension.  It has been very well-controlled when she is seen her PCP since that time.    Previous antihypertensives: Amlodipine-edema HCTZ-hypokalemia Metoprolol-fatigue Carvedilol Clonidine (rarely used) Nebivolol-did not like how she felt  Past Medical History:  Diagnosis Date   Anemia    Aortic atherosclerosis (HCC) 11/07/2021   CAD (coronary artery disease)    no cardiologist, followed by PCP only    Cataract    bil cataracts removed   Diverticulosis    Generalized headaches    GERD (gastroesophageal reflux disease)    Glaucoma    Hemorrhoids    Hiatal hernia    On CT done Jan 31, 2011    Hx of colonic polyps    Hypertension    Hyperthyroidism    Osteoarthritis    Uterine polyp    Vitamin D deficiency     Past Surgical History:  Procedure Laterality Date   ABDOMINAL HYSTERECTOMY N/A 04/26/2013   Procedure: HYSTERECTOMY ABDOMINAL;  Surgeon: Jeani Hawking, MD;  Location: [facility];  Service: Gynecology;  Laterality: N/A;   COLONOSCOPY     DILATION AND CURETTAGE OF UTERUS  01/18/2013   with uterine polypectomy   SALPINGOOPHORECTOMY Bilateral 04/26/2013   Procedure: SALPINGO OOPHORECTOMY;  Surgeon: Jeani Hawking, MD;  Location: [facility];  Service: Gynecology;  Laterality: Bilateral;   UPPER GASTROINTESTINAL ENDOSCOPY      Current Medications: No outpatient medications have been marked as taking for the 07/02/23 encounter (Appointment) with Chilton Si, MD.     Allergies:   Amlodipine, Hctz [hydrochlorothiazide], and Metoprolol   Social History   Socioeconomic History   Marital status: Divorced    Spouse name: n/a   Number of children: 3   Years of education: Not on file   Highest education level: Not on file  Occupational History   Occupation: CNA    Employer: [employer]    Comment: [employer]  Tobacco Use   Smoking status:  Never   Smokeless tobacco: Never  Vaping Use   Vaping status: Never Used  Substance and Sexual Activity   Alcohol use: No    Alcohol/week: 0.0 standard drinks of alcohol   Drug use: No   Sexual activity: Yes  Other Topics Concern   Not on file  Social History Narrative   Lives with her daughter and granddaughter.   Social Determinants of Health   Financial Resource Strain: Low Risk  (03/13/2023)   Overall Financial Resource Strain (CARDIA)    Difficulty of Paying Living Expenses: Not very hard  Food Insecurity:  No Food Insecurity (03/13/2023)   Hunger Vital Sign    Worried About Running Out of Food in the Last Year: Never true    Ran Out of Food in the Last Year: Never true  Transportation Needs: No Transportation Needs (03/13/2023)   PRAPARE - Administrator, Civil Service (Medical): No    Lack of Transportation (Non-Medical): No  Physical Activity: Sufficiently Active (03/13/2023)   Exercise Vital Sign    Days of Exercise per Week: 5 days    Minutes of Exercise per Session: 30 min  Stress: No Stress Concern Present (03/13/2023)   [employer] of Occupational Health - Occupational Stress Questionnaire    Feeling of Stress : Not at all  Social Connections: Moderately Isolated (03/13/2023)   Social Connection and Isolation Panel [NHANES]    Frequency of Communication with Friends and Family: More than three times a week    Frequency of Social Gatherings with Friends and Family: Three times a week    Attends Religious Services: More than 4 times per year    Active Member of Clubs or Organizations: No    Attends Banker Meetings: Never    Marital Status: Divorced     Family History: The patient's family history includes CVA in her brother; Deep vein thrombosis (age of onset: 10) in her son; Diabetes in her brother; Heart failure in her father; Hypertension in her brother, father, mother, and sister. There is no history of Colon cancer, Esophageal cancer, Stomach cancer, or Rectal cancer.  ROS:   Please see the history of present illness.    (+) Right-sided pain of neck, shoulder, and RUE. (+) Acid reflux (+) Snoring All other systems reviewed and are negative.  EKGs/Labs/Other Studies Reviewed:    LLE Venous Doppler 06/14/2021: Summary:  Left:  - No evidence of deep vein thrombosis from the common femoral through the  popliteal veins.  - No evidence of superficial venous thrombosis.  - The deep venous system is not competent.  - The great saphenous vein is not  competent.  - The small saphenous vein is competent.   CTA Chest/abdomen/pelvis 06/06/2020: Vascular/Lymphatic: Atherosclerotic calcification is present within the non-aneurysmal abdominal aorta, without hemodynamically significant stenosis. There is a large, partially visualized left great saphenous vein. This appears relatively stable since 2019.   IMPRESSION: 1. No acute thoracic, abdominal or pelvic pathology. Specifically, no pulmonary embolus. 2. Small hiatal hernia. 3. Colonic diverticulosis without CT evidence for diverticulitis.   Aortic Atherosclerosis (ICD10-I70.0).  Exercise Myoview 02/10/2017: Nuclear stress EF: 64%. Normal wall motion Downsloping ST segment depression ST segment depression of 2 mm was noted during stress in the II, III, aVF and V5 leads. This is a low risk perfusion study. Normal perfusion pattern would suggest false positive exercise treadmill test results. There was no transient ischemic dilatation. Similar findings were noted on prior nuclear stress test 2015.  EKG:  11/07/2021: Sinus bradycardia. Rate 58 bpm. 1st degree AV block.  Recent Labs: 04/07/2023: ALT 20; BUN 20; Creatinine, Ser 1.00; Hemoglobin 12.2; Magnesium 2.1; Platelets 219.0; Potassium 4.0; Sodium 135; TSH 1.38   Recent Lipid Panel    Component Value Date/Time   CHOL 163 04/07/2023 0927   CHOL 156 01/23/2017 1201   TRIG 162.0 (H) 04/07/2023 0927   HDL 36.10 (L) 04/07/2023 0927   HDL 55 01/23/2017 1201   CHOLHDL 5 04/07/2023 0927   VLDL 32.4 04/07/2023 0927   LDLCALC 94 04/07/2023 0927   LDLCALC 84 01/23/2017 1201    Physical Exam:    VS:  There were no vitals taken for this visit. , BMI There is no height or weight on file to calculate BMI. GENERAL:  Well appearing HEENT: Pupils equal round and reactive, fundi not visualized, oral mucosa unremarkable NECK:  No jugular venous distention, waveform within normal limits, carotid upstroke brisk and symmetric, no bruits, no  thyromegaly LUNGS:  Clear to auscultation bilaterally HEART:  RRR.  PMI not displaced or sustained,S1 and S2 within normal limits, no S3, no S4, no clicks, no rubs, no murmurs ABD:  Flat, positive bowel sounds normal in frequency in pitch, no bruits, no rebound, no guarding, no midline pulsatile mass, no hepatomegaly, no splenomegaly EXT:  2 plus pulses throughout, no edema, no cyanosis no clubbing SKIN:  No rashes no nodules NEURO:  Cranial nerves II through XII grossly intact, motor grossly intact throughout PSYCH:  Cognitively intact, oriented to person place and time   ASSESSMENT/PLAN:    No problem-specific Assessment & Plan notes found for this encounter.    Screening for Secondary Hypertension:     11/07/2021    8:34 AM  Causes  Drugs/Herbals Screened     - Comments limiting sodium intake, rare caffeine, no EtOH, rare NSAIDs  Renovascular HTN Screened     - Comments Check renal artery Dopplers  Sleep Apnea Screened     - Comments snoring but no other symptoms  Thyroid Disease Screened  Hyperaldosteronism Screened     - Comments No adenomas on CT in 2021  Pheochromocytoma Screened     - Comments Check catecholamines metanephrines  Cushing's Syndrome N/A  Hyperparathyroidism Screened  Coarctation of the Aorta Screened     - Comments BP symmetric  Compliance Screened     - Comments Verified with pharmacy    Relevant Labs/Studies:    Latest Ref Rng & Units 04/07/2023    9:27 AM 08/15/2022   10:35 AM 04/03/2022    9:19 AM  Basic Labs  Sodium 135 - 145 mEq/L 135  136  139   Potassium 3.5 - 5.1 mEq/L 4.0  4.0  4.4   Creatinine 0.40 - 1.20 mg/dL 1.61  0.96  0.45        Latest Ref Rng & Units 04/07/2023    9:27 AM 08/15/2022   10:35 AM  Thyroid   TSH 0.35 - 5.50 uIU/mL 1.38  1.24        Latest Ref Rng & Units 01/23/2017   12:01 PM  Renin/Aldosterone   Aldosterone 0.0 - 30.0 ng/dL 40.9   Renin 8.119 - 1.478 ng/mL/hr <0.167   Aldos/Renin Ratio 0.0 - 30.0 >122.2         Latest Ref Rng & Units 11/13/2021    9:09 AM  Metanephrines/Catecholamines   Epinephrine 0 - 62 pg/mL 39   Norepinephrine 0 - 874 pg/mL 581   Dopamine 0 - 48 pg/mL <30  Metanephrines 0.0 - 88.0 pg/mL 34.4   Normetanephrines  0.0 - 285.2 pg/mL 60.9        Latest Ref Rng & Units 11/02/2015   10:15 AM  Cortisol  Cortisol  ug/dL 6.8       Disposition:    FU with APP/PharmD in 1 month for the next 3 months.   FU with Milany Geck C. Duke Salvia, MD, [employer] in 4 months.   Medication Adjustments/Labs and Tests Ordered: Current medicines are reviewed at length with the patient today.  Concerns regarding medicines are outlined above.   No orders of the defined types were placed in this encounter.  No orders of the defined types were placed in this encounter.  I,Mathew Stumpf,acting as a Neurosurgeon for Chilton Si, MD.,have documented all relevant documentation on the behalf of Chilton Si, MD,as directed by  Chilton Si, MD while in the presence of Chilton Si, MD.  I, Elden Brucato C. Duke Salvia, MD have reviewed all documentation for this visit.  The documentation of the exam, diagnosis, procedures, and orders on 07/02/2023 are all accurate and complete.   Signed, Chilton Si, MD  07/02/2023 8:05 AM    [facility]

## 2023-07-02 NOTE — Progress Notes (Signed)
[facility] Follow-up:    Date:  07/02/2023   ID:  Kelly Santos, DOB 08/26/1946, MRN [id number]  PCP:  Tresa Garter, MD  Cardiologist:  None  Nephrologist:  Referring MD: Tresa Garter, MD   CC: Hypertension  History of Present Illness:    Kelly Santos is a 77 y.o. female with a hx of coronary artery disease, hypertension, hyperthyroidism, GERD, esophageal stricture, anemia, and glaucoma, here for follow-up. She was initially seen 11/07/2021 in the [facility]. She previously had a left heart cath in 2012 with trivial CAD and normal EF. Nuclear stress in 2015 and Echo in 2017 were unremarkable. She was last seen in cardiology by Dr. Anne Fu on 02/28/2017 and was doing well. Her blood pressure was stable on spironolactone. Kelly Santos saw her PCP 10/09/2021 and her BP was 162/90. She reported readings of 160-170 in the AM and 130-146 in the PM at home. She was instructed to continue with candesartan and spironolactone in the morning, add 10 mg bystolic at hs starting with 1/2 a day, and use catapress as needed. She was referred to the Advanced Hypertension clinic.  At her visit 10/2021 blood pressures remained uncontrolled.  She was walking and plan to increase her exercise.  Spironolactone was increased to 50 mg and HCTZ 12.5 mg was added.  She followed up with our pharmacist 11/2021 and blood pressure was much better controlled at home, averaging in the 120s.  It was in the 150s in the office suggestive of whitecoat hypertension superimposed on essential hypertension.  It has been very well-controlled when she has seen her PCP since that time.  Today, she states she is feeling alright from a cardiovascular perspective. She complains of some lower back pain which is possibly attributable to arthritis. She has been following with her PCP regarding this and recently had a spinal x-ray which noted some degenerative disc disease. In the office her blood  pressure is 106/66; this morning it was 113/81. At home, she has noticed her blood pressure will be around 138/81-90 when she checks it 8:30-9 AM, with subsequent improvement to the 110s systolic after taking her medications. She confirms that she is taking the 50 mg dose of spironolactone once daily. Lately she has been exercising by going on walks 3-6 days a week. No anginal symptoms. She is also trying to maintain a healthy diet and has been monitoring her sodium intake. She denies any palpitations, chest pain, shortness of breath, peripheral edema, lightheadedness, headaches, syncope, orthopnea, or PND.  Previous antihypertensives: Amlodipine-edema HCTZ-hypokalemia Metoprolol-fatigue Carvedilol Clonidine (rarely used) Nebivolol-did not like how she felt  Past Medical History:  Diagnosis Date   Anemia    Aortic atherosclerosis (HCC) 11/07/2021   CAD (coronary artery disease)    no cardiologist, followed by PCP only   Cataract    bil cataracts removed   Diverticulosis    Generalized headaches    GERD (gastroesophageal reflux disease)    Glaucoma    Hemorrhoids    Hiatal hernia    On CT done Jan 31, 2011    Hx of colonic polyps    Hypertension    Hyperthyroidism    Osteoarthritis    Uterine polyp    Vitamin D deficiency     Past Surgical History:  Procedure Laterality Date   ABDOMINAL HYSTERECTOMY N/A 04/26/2013   Procedure: HYSTERECTOMY ABDOMINAL;  Surgeon: Jeani Hawking, MD;  Location: [facility];  Service: Gynecology;  Laterality: N/A;  COLONOSCOPY     DILATION AND CURETTAGE OF UTERUS  01/18/2013   with uterine polypectomy   SALPINGOOPHORECTOMY Bilateral 04/26/2013   Procedure: SALPINGO OOPHORECTOMY;  Surgeon: Jeani Hawking, MD;  Location: [facility];  Service: Gynecology;  Laterality: Bilateral;   UPPER GASTROINTESTINAL ENDOSCOPY      Current Medications: Current Meds  Medication Sig   amitriptyline (ELAVIL) 25 MG tablet TAKE 1 TABLET (25 MG TOTAL) BY MOUTH AT BEDTIME  AS NEEDED FOR SLEEP. DUE FOR ROUTINE OV   brimonidine (ALPHAGAN) 0.2 % ophthalmic solution SMARTSIG:1 Drop(s) In Eye(s) Every 12 Hours   candesartan (ATACAND) 32 MG tablet TAKE 1 TABLET BY MOUTH DAILY.   cholecalciferol (VITAMIN D) 1000 UNITS tablet Take 1,000 Units by mouth every other day.    COMBIGAN 0.2-0.5 % ophthalmic solution INSTILL 1 DROP INTO BOTH EYES TWICE A DAY   Ferrous Sulfate (IRON) 325 (65 FE) MG TABS Take 1 tablet by mouth 3 (three) times a week.    hydrocortisone (PROCTOCORT) 1 % CREA Apply 1 application topically 2 (two) times daily as needed (dry skin).    ketoconazole (NIZORAL) 2 % cream Apply 1 Application topically daily.   pantoprazole (PROTONIX) 40 MG tablet TAKE 1 TABLET BY MOUTH EVERY DAY   spironolactone (ALDACTONE) 50 MG tablet TAKE 1 TABLET BY MOUTH DAILY   timolol (TIMOPTIC) 0.5 % ophthalmic solution 1 drop every morning.   triamcinolone ointment (KENALOG) 0.1 % Apply 1 Application topically 2 (two) times daily.   [DISCONTINUED] hydrochlorothiazide (HYDRODIURIL) 12.5 MG tablet TAKE 1 TABLET BY MOUTH DAILY. CALL OFFICE FOR APPOINTMENT, NO MORE REFILLS UNTIL SEEN   [DISCONTINUED] spironolactone (ALDACTONE) 25 MG tablet TAKE 1 TABLET BY MOUTH EVERY DAY     Allergies:   Amlodipine, Hctz [hydrochlorothiazide], and Metoprolol   Social History   Socioeconomic History   Marital status: Divorced    Spouse name: n/a   Number of children: 3   Years of education: Not on file   Highest education level: Not on file  Occupational History   Occupation: CNA    Employer: [employer]    Comment: [employer]  Tobacco Use   Smoking status: Never   Smokeless tobacco: Never  Vaping Use   Vaping status: Never Used  Substance and Sexual Activity   Alcohol use: No    Alcohol/week: 0.0 standard drinks of alcohol   Drug use: No   Sexual activity: Yes  Other Topics Concern   Not on file  Social History Narrative   Lives with her daughter and granddaughter.    Social Determinants of Health   Financial Resource Strain: Low Risk  (03/13/2023)   Overall Financial Resource Strain (CARDIA)    Difficulty of Paying Living Expenses: Not very hard  Food Insecurity: No Food Insecurity (03/13/2023)   Hunger Vital Sign    Worried About Running Out of Food in the Last Year: Never true    Ran Out of Food in the Last Year: Never true  Transportation Needs: No Transportation Needs (03/13/2023)   PRAPARE - Administrator, Civil Service (Medical): No    Lack of Transportation (Non-Medical): No  Physical Activity: Sufficiently Active (03/13/2023)   Exercise Vital Sign    Days of Exercise per Week: 5 days    Minutes of Exercise per Session: 30 min  Stress: No Stress Concern Present (03/13/2023)   [employer] of Occupational Health - Occupational Stress Questionnaire    Feeling of Stress : Not at all  Social  Connections: Moderately Isolated (03/13/2023)   Social Connection and Isolation Panel [NHANES]    Frequency of Communication with Friends and Family: More than three times a week    Frequency of Social Gatherings with Friends and Family: Three times a week    Attends Religious Services: More than 4 times per year    Active Member of Clubs or Organizations: No    Attends Banker Meetings: Never    Marital Status: Divorced     Family History: The patient's family history includes CVA in her brother; Deep vein thrombosis (age of onset: 43) in her son; Diabetes in her brother; Heart failure in her father; Hypertension in her brother, father, mother, and sister. There is no history of Colon cancer, Esophageal cancer, Stomach cancer, or Rectal cancer.  ROS:   Please see the history of present illness.    (+) Low back pain All other systems reviewed and are negative.  EKGs/Labs/Other Studies Reviewed:    Bilateral Renal Artery Dopplers  11/14/2021: Summary:  Largest Aortic Diameter: 2.1 cm    Renal:  Right: Normal size right  kidney. Normal right Resisitive Index.         Normal cortical thickness of right kidney. No evidence of         right renal artery stenosis. RRV flow present.  Left:  Normal size of left kidney. Normal left Resistive Index.         Normal cortical thickness of the left kidney. No evidence of         left renal artery stenosis. LRV flow present.  Mesenteric:  Normal Celiac artery and Superior Mesenteric artery findings.   LLE Venous Doppler 06/14/2021: Summary:  Left:  - No evidence of deep vein thrombosis from the common femoral through the  popliteal veins.  - No evidence of superficial venous thrombosis.  - The deep venous system is not competent.  - The great saphenous vein is not competent.  - The small saphenous vein is competent.   CTA Chest/abdomen/pelvis 06/06/2020: Vascular/Lymphatic: Atherosclerotic calcification is present within the non-aneurysmal abdominal aorta, without hemodynamically significant stenosis. There is a large, partially visualized left great saphenous vein. This appears relatively stable since 2019.   IMPRESSION: 1. No acute thoracic, abdominal or pelvic pathology. Specifically, no pulmonary embolus. 2. Small hiatal hernia. 3. Colonic diverticulosis without CT evidence for diverticulitis.   Aortic Atherosclerosis (ICD10-I70.0).  Exercise Myoview 02/10/2017: Nuclear stress EF: 64%. Normal wall motion Downsloping ST segment depression ST segment depression of 2 mm was noted during stress in the II, III, aVF and V5 leads. This is a low risk perfusion study. Normal perfusion pattern would suggest false positive exercise treadmill test results. There was no transient ischemic dilatation. Similar findings were noted on prior nuclear stress test 2015.  EKG:  EKG is personally reviewed. 07/02/2023:  Sinus rhythm. Rate 72 bpm. 11/07/2021: Sinus bradycardia. Rate 58 bpm. 1st degree AV block.  Recent Labs: 04/07/2023: ALT 20; BUN 20; Creatinine, Ser 1.00;  Hemoglobin 12.2; Magnesium 2.1; Platelets 219.0; Potassium 4.0; Sodium 135; TSH 1.38   Recent Lipid Panel    Component Value Date/Time   CHOL 163 04/07/2023 0927   CHOL 156 01/23/2017 1201   TRIG 162.0 (H) 04/07/2023 0927   HDL 36.10 (L) 04/07/2023 0927   HDL 55 01/23/2017 1201   CHOLHDL 5 04/07/2023 0927   VLDL 32.4 04/07/2023 0927   LDLCALC 94 04/07/2023 0927   LDLCALC 84 01/23/2017 1201    Physical Exam:  VS:  BP 106/66 (BP Location: Right Arm, Patient Position: Sitting, Cuff Size: Normal)   Pulse 64   Ht 5\' 3"  (1.6 m)   Wt 174 lb 11.2 oz (79.2 kg)   SpO2 97%   BMI 30.95 kg/m  , BMI Body mass index is 30.95 kg/m. GENERAL:  Well appearing HEENT: Pupils equal round and reactive, fundi not visualized, oral mucosa unremarkable NECK:  No jugular venous distention, waveform within normal limits, carotid upstroke brisk and symmetric, no bruits, no thyromegaly LUNGS:  Clear to auscultation bilaterally HEART:  RRR.  PMI not displaced or sustained,S1 and S2 within normal limits, no S3, no S4, no clicks, no rubs, no murmurs ABD:  Flat, positive bowel sounds normal in frequency in pitch, no bruits, no rebound, no guarding, no midline pulsatile mass, no hepatomegaly, no splenomegaly EXT:  2 plus pulses throughout, no edema, no cyanosis no clubbing SKIN:  No rashes no nodules NEURO:  Cranial nerves II through XII grossly intact, motor grossly intact throughout PSYCH:  Cognitively intact, oriented to person place and time   ASSESSMENT/PLAN:    Degenerative Joint Disease (Lower Spine) Reports of back pain likely due to arthritis as per recent X-ray findings. -Continue current management plan.  # Hypertension Blood pressure readings have been low recently, potentially due to improved lifestyle habits and medication regimen. Patient is currently on HCTZ, Spironolactone 50mg  daily and Candesartan. -Hold Hydrochlorothiazide for two weeks. -Check blood pressure twice daily at  home. -Contact office with blood pressure readings in two weeks to assess need for medication adjustment.  Goal is <130/80.  General Health Maintenance -Plan to follow up in six months, or sooner if blood pressure increases.        Screening for Secondary Hypertension:     11/07/2021    8:34 AM  Causes  Drugs/Herbals Screened     - Comments limiting sodium intake, rare caffeine, no EtOH, rare NSAIDs  Renovascular HTN Screened     - Comments Check renal artery Dopplers  Sleep Apnea Screened     - Comments snoring but no other symptoms  Thyroid Disease Screened  Hyperaldosteronism Screened     - Comments No adenomas on CT in 2021  Pheochromocytoma Screened     - Comments Check catecholamines metanephrines  Cushing's Syndrome N/A  Hyperparathyroidism Screened  Coarctation of the Aorta Screened     - Comments BP symmetric  Compliance Screened     - Comments Verified with pharmacy    Relevant Labs/Studies:    Latest Ref Rng & Units 04/07/2023    9:27 AM 08/15/2022   10:35 AM 04/03/2022    9:19 AM  Basic Labs  Sodium 135 - 145 mEq/L 135  136  139   Potassium 3.5 - 5.1 mEq/L 4.0  4.0  4.4   Creatinine 0.40 - 1.20 mg/dL 1.61  0.96  0.45        Latest Ref Rng & Units 04/07/2023    9:27 AM 08/15/2022   10:35 AM  Thyroid   TSH 0.35 - 5.50 uIU/mL 1.38  1.24        Latest Ref Rng & Units 01/23/2017   12:01 PM  Renin/Aldosterone   Aldosterone 0.0 - 30.0 ng/dL 40.9   Renin 8.119 - 1.478 ng/mL/hr <0.167   Aldos/Renin Ratio 0.0 - 30.0 >122.2        Latest Ref Rng & Units 11/13/2021    9:09 AM  Metanephrines/Catecholamines   Epinephrine 0 - 62 pg/mL 39   Norepinephrine  0 - 874 pg/mL 581   Dopamine 0 - 48 pg/mL <30   Metanephrines 0.0 - 88.0 pg/mL 34.4   Normetanephrines  0.0 - 285.2 pg/mL 60.9        Latest Ref Rng & Units 11/02/2015   10:15 AM  Cortisol  Cortisol  ug/dL 6.8       Disposition:    FU with Shelbi Vaccaro C. Duke Salvia, MD, [facility] in 6 months.  Medication  Adjustments/Labs and Tests Ordered: Current medicines are reviewed at length with the patient today.  Concerns regarding medicines are outlined above.   Orders Placed This Encounter  Procedures   EKG 12-Lead   No orders of the defined types were placed in this encounter.  I,Mathew Stumpf,acting as a Neurosurgeon for Chilton Si, MD.,have documented all relevant documentation on the behalf of Chilton Si, MD,as directed by  Chilton Si, MD while in the presence of Chilton Si, MD.  I, Harvir Patry C. Duke Salvia, MD have reviewed all documentation for this visit.  The documentation of the exam, diagnosis, procedures, and orders on 07/02/2023 are all accurate and complete.  Signed, Chilton Si, MD  07/02/2023 2:48 PM    [facility]

## 2023-07-02 NOTE — Patient Instructions (Signed)
Medication Instructions:    DISCONTINUE hydrochlorothiazide   *If you need a refill on your cardiac medications before your next appointment, please call your pharmacy*   Lab Work:  None ordered.  If you have labs (blood work) drawn today and your tests are completely normal, you will receive your results only by: MyChart Message (if you have MyChart) OR A paper copy in the mail If you have any lab test that is abnormal or we need to change your treatment, we will call you to review the results.   Testing/Procedures:  None ordered.   Follow-Up: At [facility], you and your health needs are our priority.  As part of our continuing mission to provide you with exceptional heart care, we have created designated Provider Care Teams.  These Care Teams include your primary Cardiologist (physician) and Advanced Practice Providers (APPs -  Physician Assistants and Nurse Practitioners) who all work together to provide you with the care you need, when you need it.  We recommend signing up for the patient portal called "MyChart".  Sign up information is provided on this After Visit Summary.  MyChart is used to connect with patients for Virtual Visits (Telemedicine).  Patients are able to view lab/test results, encounter notes, upcoming appointments, etc.  Non-urgent messages can be sent to your provider as well.   To learn more about what you can do with MyChart, go to [email or web address].    Your next appointment:   6 month(s)  Provider:   Chilton Si, MD or Lily Kocher    Other Instructions  Your physician wants you to follow-up in: 6 months.  You will receive a reminder letter in the mail two months in advance. If you don't receive a letter, please call our office to schedule the follow-up appointment.   HOW TO TAKE YOUR BLOOD PRESSURE  Rest 5 minutes before taking your blood pressure. Don't  smoke or drink caffeinated beverages for at least 30 minutes  before. Take your blood pressure before (not after) you eat. Sit comfortably with your back supported and both feet on the floor ( don't cross your legs). Elevate your arm to heart level on a table or a desk. Use the proper sized cuff.  It should fit smoothly and snugly around your bare upper arm.  There should be  Enough room to slip a fingertip under the cuff.  The bottom edge of the cuff should be 1 inch above the crease Of the elbow. Please monitor your blood pressure once daily 2 hours after your am medication. If you blood pressure Consistently remains above 130 (systolic) top number or over 80 ( diastolic) bottom number X 3 days  Consecutively.  Please call our office at [phone] or send Mychart message.     ----Avoid cold medicines with D or DM at the end of them----

## 2023-07-03 ENCOUNTER — Other Ambulatory Visit: Payer: Self-pay | Admitting: Internal Medicine

## 2023-07-30 ENCOUNTER — Telehealth (HOSPITAL_BASED_OUTPATIENT_CLINIC_OR_DEPARTMENT_OTHER): Payer: Self-pay | Admitting: Cardiovascular Disease

## 2023-07-30 NOTE — Telephone Encounter (Signed)
Advised patient, verbalized understanding  

## 2023-07-30 NOTE — Telephone Encounter (Signed)
Have her re-start the hydrochlorothiazide 12.5 mg daily and report BP readings in 2-3 weeks

## 2023-07-30 NOTE — Telephone Encounter (Signed)
New Message:   Patient said she was told to call back with her blood pressure readings.   07-21-23--    135/87 07-22-23--    134/83 07-23-23--    out of town 07-26-23--    136/93 07-27-23--     142/94 AM        129/85 afternoon 07-28-23--      148/86               124/82 07-29-23--       126/90 07-30-23--        137/87

## 2023-07-30 NOTE — Telephone Encounter (Signed)
Blood pressure readings 2 hours after medications  Hydrochlorothiazide d/c at 10/2 office visit secondary to lower blood pressure  Takes Candesartan 32 mg and Spironolactone 50 mg daily   Will forward to Pharm D and Dr Duke Salvia for review

## 2023-08-21 ENCOUNTER — Encounter: Payer: Self-pay | Admitting: Internal Medicine

## 2023-08-21 ENCOUNTER — Ambulatory Visit: Payer: Medicare Other | Admitting: Internal Medicine

## 2023-08-21 VITALS — BP 110/82 | HR 83 | Temp 98.3°F | Ht 63.0 in | Wt 172.2 lb

## 2023-08-21 DIAGNOSIS — G8929 Other chronic pain: Secondary | ICD-10-CM | POA: Diagnosis not present

## 2023-08-21 DIAGNOSIS — E559 Vitamin D deficiency, unspecified: Secondary | ICD-10-CM | POA: Diagnosis not present

## 2023-08-21 DIAGNOSIS — M1991 Primary osteoarthritis, unspecified site: Secondary | ICD-10-CM

## 2023-08-21 DIAGNOSIS — M5442 Lumbago with sciatica, left side: Secondary | ICD-10-CM

## 2023-08-21 DIAGNOSIS — I10 Essential (primary) hypertension: Secondary | ICD-10-CM | POA: Diagnosis not present

## 2023-08-21 NOTE — Assessment & Plan Note (Signed)
Blue-Emu cream or Voltaren gel use 2-3 times a day

## 2023-08-21 NOTE — Assessment & Plan Note (Signed)
Much better - doing well

## 2023-08-21 NOTE — Assessment & Plan Note (Signed)
Worse - OA Blue-Emu cream or Voltaren gel - use 2-3 times a day Sports Med ref

## 2023-08-21 NOTE — Patient Instructions (Addendum)
Blue-Emu cream or Voltaren gel - use 2-3 times a day Rice sock heating pad

## 2023-08-21 NOTE — Progress Notes (Signed)
Subjective:  Patient ID: Kelly Santos, female    DOB: 1946/07/13  Age: 77 y.o. MRN: [id number]  CC: Back Pain (PT has past history of back pain. Notes of bad pain episode on Sunday after bending down to check their dresser. PT could not move much within that day. PT treats with Tylonel) and Abdominal Pain (Dull abdominal pain/pressure x1-2 months, especially when attempting to have a bowel movement. Bowel movements have became less frequent. Slight bit of blood in stool (has had hemorrhoids in past) )   HPI Kelly Santos presents for LBP - worse; no irradiation, B sides F/u OA, HTN  Outpatient Medications Prior to Visit  Medication Sig Dispense Refill   amitriptyline (ELAVIL) 25 MG tablet TAKE 1 TABLET (25 MG TOTAL) BY MOUTH AT BEDTIME AS NEEDED FOR SLEEP. DUE FOR ROUTINE OV 90 tablet 2   brimonidine (ALPHAGAN) 0.2 % ophthalmic solution SMARTSIG:1 Drop(s) In Eye(s) Every 12 Hours     candesartan (ATACAND) 32 MG tablet TAKE 1 TABLET BY MOUTH DAILY. 90 tablet 3   cholecalciferol (VITAMIN D) 1000 UNITS tablet Take 1,000 Units by mouth every other day.      COMBIGAN 0.2-0.5 % ophthalmic solution INSTILL 1 DROP INTO BOTH EYES TWICE A DAY 5 mL 2   Ferrous Sulfate (IRON) 325 (65 FE) MG TABS Take 1 tablet by mouth 3 (three) times a week.      hydrocortisone (PROCTOCORT) 1 % CREA Apply 1 application topically 2 (two) times daily as needed (dry skin).      pantoprazole (PROTONIX) 40 MG tablet TAKE 1 TABLET BY MOUTH EVERY DAY 90 tablet 3   spironolactone (ALDACTONE) 50 MG tablet TAKE 1 TABLET BY MOUTH DAILY 90 tablet 3   timolol (TIMOPTIC) 0.5 % ophthalmic solution 1 drop every morning.     triamcinolone ointment (KENALOG) 0.1 % Apply 1 Application topically 2 (two) times daily. 160 g 3   hydrochlorothiazide (MICROZIDE) 12.5 MG capsule Take 12.5 mg by mouth daily. (Patient not taking: Reported on 08/21/2023)     No facility-administered medications prior to visit.    ROS: Review of Systems   Constitutional:  Negative for activity change, appetite change, chills, fatigue and unexpected weight change.  HENT:  Negative for congestion, mouth sores and sinus pressure.   Eyes:  Negative for visual disturbance.  Respiratory:  Negative for cough and chest tightness.   Gastrointestinal:  Negative for abdominal pain and nausea.  Genitourinary:  Negative for difficulty urinating, frequency and vaginal pain.  Musculoskeletal:  Positive for back pain. Negative for gait problem.  Skin:  Negative for pallor and rash.  Neurological:  Negative for dizziness, tremors, weakness, numbness and headaches.  Psychiatric/Behavioral:  Negative for confusion, sleep disturbance and suicidal ideas.     Objective:  BP 110/82   Pulse 83   Temp 98.3 F (36.8 C) (Oral)   Ht 5\' 3"  (1.6 m)   Wt 172 lb 3.2 oz (78.1 kg)   SpO2 97%   BMI 30.50 kg/m   BP Readings from Last 3 Encounters:  08/21/23 110/82  07/02/23 106/66  04/07/23 110/70    Wt Readings from Last 3 Encounters:  08/21/23 172 lb 3.2 oz (78.1 kg)  07/02/23 174 lb 11.2 oz (79.2 kg)  04/07/23 172 lb (78 kg)    Physical Exam Constitutional:      General: She is not in acute distress.    Appearance: She is well-developed.  HENT:     Head: Normocephalic.  Right Ear: External ear normal.     Left Ear: External ear normal.     Nose: Nose normal.  Eyes:     General:        Right eye: No discharge.        Left eye: No discharge.     Conjunctiva/sclera: Conjunctivae normal.     Pupils: Pupils are equal, round, and reactive to light.  Neck:     Thyroid: No thyromegaly.     Vascular: No JVD.     Trachea: No tracheal deviation.  Cardiovascular:     Rate and Rhythm: Normal rate and regular rhythm.     Heart sounds: Normal heart sounds.  Pulmonary:     Effort: No respiratory distress.     Breath sounds: No stridor. No wheezing.  Abdominal:     General: Bowel sounds are normal. There is no distension.     Palpations: Abdomen is  soft. There is no mass.     Tenderness: There is no abdominal tenderness. There is no guarding or rebound.  Musculoskeletal:        General: No tenderness.     Cervical back: Normal range of motion and neck supple. No rigidity.  Lymphadenopathy:     Cervical: No cervical adenopathy.  Skin:    Findings: No erythema or rash.  Neurological:     Mental Status: She is oriented to person, place, and time.     Cranial Nerves: No cranial nerve deficit.     Motor: No abnormal muscle tone.     Coordination: Coordination normal.     Deep Tendon Reflexes: Reflexes normal.  Psychiatric:        Behavior: Behavior normal.        Thought Content: Thought content normal.        Judgment: Judgment normal.   LS spine is stiff a little Str leg elev (-) B   Lab Results  Component Value Date   WBC 4.3 04/07/2023   HGB 12.2 04/07/2023   HCT 36.5 04/07/2023   PLT 219.0 04/07/2023   GLUCOSE 105 (H) 04/07/2023   CHOL 163 04/07/2023   TRIG 162.0 (H) 04/07/2023   HDL 36.10 (L) 04/07/2023   LDLCALC 94 04/07/2023   ALT 20 04/07/2023   AST 17 04/07/2023   NA 135 04/07/2023   K 4.0 04/07/2023   CL 101 04/07/2023   CREATININE 1.00 04/07/2023   BUN 20 04/07/2023   CO2 30 04/07/2023   TSH 1.38 04/07/2023   INR 0.96 05/09/2011   HGBA1C 6.1 08/15/2022    VAS [place] LOWER EXTREMITY VENOUS (DVT)  Result Date: 06/14/2021  Lower Venous Reflux Study Patient Name:  Kelly Santos  Date of Exam:   06/14/2021 Medical Rec #: [id number]      Accession #:    [id number] Date of Birth: 06/17/1946       Patient Gender: F Patient Age:   89 years Exam Location:  [facility] Procedure:      VAS [place] LOWER EXTREMITY VENOUS REFLUX Referring Phys: Kennyth Arnold BURNS --------------------------------------------------------------------------------  Indications: Left lower extremity pain and swelling. Study ordered as a rule out DVT exam which was performed in conjunction with a left leg reflux exam.  Risk Factors:  Significant varicosities of the left lower extremity. Performing Technologist: Dorthula Matas RVS, RCS  Examination Guidelines: A complete evaluation includes B-mode imaging, spectral Doppler, color Doppler, and power Doppler as needed of all accessible portions of each vessel. Bilateral testing is considered an  integral part of a complete examination. Limited examinations for reoccurring indications may be performed as noted. The reflux portion of the exam is performed with the patient in reverse Trendelenburg. Significant venous reflux is defined as >500 ms in the superficial venous system, and >1 second in the deep venous system.  +--------------+---------+------+-----------+------------+--------+ LEFT          Reflux NoRefluxReflux TimeDiameter cmsComments                         Yes                                  +--------------+---------+------+-----------+------------+--------+ CFV                     yes   >1 second                      +--------------+---------+------+-----------+------------+--------+ FV mid                  yes   >1 second                      +--------------+---------+------+-----------+------------+--------+ Popliteal     no                                             +--------------+---------+------+-----------+------------+--------+ GSV at SFJ              yes    >500 ms      1.25             +--------------+---------+------+-----------+------------+--------+ GSV prox thigh          yes    >500 ms      0.69             +--------------+---------+------+-----------+------------+--------+ GSV mid thigh           yes    >500 ms      0.72             +--------------+---------+------+-----------+------------+--------+ GSV dist thigh          yes    >500 ms      0.68             +--------------+---------+------+-----------+------------+--------+ GSV at knee             yes    >500 ms      0.65              +--------------+---------+------+-----------+------------+--------+ GSV prox calf           yes    >500 ms      0.70             +--------------+---------+------+-----------+------------+--------+ SSV Pop Fossa no                            0.19             +--------------+---------+------+-----------+------------+--------+  Findings attempted to be reported to Dr. Cheryll Cockayne office at 11:25 am. No answer. Results will be routed in epic.  Summary: Left: - No evidence of deep vein thrombosis from the common femoral through the popliteal veins. - No evidence of superficial venous thrombosis. - The deep venous system  is not competent. - The great saphenous vein is not competent. - The small saphenous vein is competent.  *See table(s) above for measurements and observations. Electronically signed by Heath Lark on 06/14/2021 at 1:34:39 PM.    Final     Assessment & Plan:   Problem List Items Addressed This Visit     Vitamin D deficiency    On Vit D      Hypertension, uncontrolled    Much better - doing well      Osteoarthritis    Blue-Emu cream or Voltaren gel - use 2-3 times a day      Low back pain - Primary    Worse - OA Blue-Emu cream or Voltaren gel - use 2-3 times a day Sports Med ref       Relevant Orders   Ambulatory referral to Sports Medicine      No orders of the defined types were placed in this encounter.     Follow-up: No follow-ups on file.  Sonda Primes, MD

## 2023-08-21 NOTE — Assessment & Plan Note (Signed)
On Vit D 

## 2023-08-22 ENCOUNTER — Other Ambulatory Visit: Payer: Self-pay | Admitting: Internal Medicine

## 2023-08-26 ENCOUNTER — Ambulatory Visit (INDEPENDENT_AMBULATORY_CARE_PROVIDER_SITE_OTHER): Payer: Medicare Other

## 2023-08-26 ENCOUNTER — Encounter: Payer: Self-pay | Admitting: Family Medicine

## 2023-08-26 ENCOUNTER — Ambulatory Visit: Payer: Medicare Other | Admitting: Family Medicine

## 2023-08-26 VITALS — BP 120/84 | HR 72 | Ht 63.0 in | Wt 176.0 lb

## 2023-08-26 DIAGNOSIS — G8929 Other chronic pain: Secondary | ICD-10-CM

## 2023-08-26 DIAGNOSIS — M545 Low back pain, unspecified: Secondary | ICD-10-CM

## 2023-08-26 DIAGNOSIS — M47816 Spondylosis without myelopathy or radiculopathy, lumbar region: Secondary | ICD-10-CM | POA: Diagnosis not present

## 2023-08-26 DIAGNOSIS — M48061 Spinal stenosis, lumbar region without neurogenic claudication: Secondary | ICD-10-CM | POA: Diagnosis not present

## 2023-08-26 NOTE — Patient Instructions (Addendum)
Thank you for coming in today.   I've referred you to Physical Therapy.  Let us know if you don't hear from them in one week.   Please get an Xray today before you leave   Check back in 6 weeks

## 2023-08-26 NOTE — Progress Notes (Signed)
   I, Stevenson Clinch, CMA acting as a scribe for Kelly Graham, MD.  Kelly Santos is a 77 y.o. female who presents to [facility] at [facility] today for LBP worsening on Sunday, 11/17. Pt was bending down to wash in the shower when sx started. Pt locates pain to midline lower back, also having some mid-back pain. Sx worse in the mornings and with bending/twisting. Sx wax and wane but remain for awhile after being triggered. Constant tenderness.  She notes she did have a fall about a month ago landed on her left leg.  She did not have immediate severe back pain after that fall.  Radiating pain: no LE numbness/tingling: feet LE weakness: no Aggravates: bending, twisting Treatments tried: Tylenol - minimal relief  Dx imaging: 04/07/23 L-spine XR  Pertinent review of systems: No fevers or chills  Relevant historical information: Hypertension   Exam:  BP 120/84   Pulse 72   Ht 5\' 3"  (1.6 m)   Wt 176 lb (79.8 kg)   SpO2 98%   BMI 31.18 kg/m  General: Well Developed, well nourished, and in no acute distress.   MSK: L-spine nontender palpation spinal midline. Tender palpation perispinal musculature bilaterally. Decreased lumbar motion. Lower extremity strength is intact.    Lab and Radiology Results  X-ray images lumbar spine obtained today personally and independently interpreted No acute fractures.  Degenerative changes present at L5-S1 Await formal radiology review    Assessment and Plan: 77 y.o. female with acute exacerbation of chronic low back pain.  Pain today thought to be primarily due to muscle spasm and dysfunction of the perispinal or multifidus musculature.  She is a good candidate for physical therapy.  Plan refer to PT.  Recommend heating pad and TENS unit.  We discussed muscle relaxers and decided against them at this time.  We talked about safe dosing of over-the-counter medicines.  Tylenol will be safer than NSAIDs for her.  Recheck in 6  weeks.   PDMP not reviewed this encounter. Orders Placed This Encounter  Procedures   DG Lumbar Spine 2-3 Views    Standing Status:   Future    Number of Occurrences:   1    Standing Expiration Date:   09/25/2023    Order Specific Question:   Reason for Exam (SYMPTOM  OR DIAGNOSIS REQUIRED)    Answer:   low back pain    Order Specific Question:   Preferred imaging location?    Answer:   [facility]   Ambulatory referral to Physical Therapy    Referral Priority:   Routine    Referral Type:   Physical Medicine    Referral Reason:   Specialty Services Required    Requested Specialty:   Physical Therapy    Number of Visits Requested:   1   No orders of the defined types were placed in this encounter.    Discussed warning signs or symptoms. Please see discharge instructions. Patient expresses understanding.   The above documentation has been reviewed and is accurate and complete Kelly Santos, M.D.

## 2023-09-04 ENCOUNTER — Telehealth: Payer: Self-pay | Admitting: Cardiovascular Disease

## 2023-09-04 NOTE — Telephone Encounter (Signed)
Pt's blood pressure log.

## 2023-09-04 NOTE — Telephone Encounter (Signed)
Patient's BP readings:  125/84 123/82 111/72 118/81 120/84 105/74 141/96 124/81 101/70 123/82 123/82 124/85 101/70

## 2023-09-09 NOTE — Progress Notes (Signed)
Lumbar spine x-ray shows mild arthritis.

## 2023-09-10 NOTE — Telephone Encounter (Signed)
BP readings look good - on candesartan 32, hctz 12.5 and spironolactone 50

## 2023-09-30 ENCOUNTER — Ambulatory Visit: Payer: Medicare Other | Attending: Family Medicine

## 2023-09-30 DIAGNOSIS — G8929 Other chronic pain: Secondary | ICD-10-CM | POA: Insufficient documentation

## 2023-09-30 DIAGNOSIS — M545 Low back pain, unspecified: Secondary | ICD-10-CM | POA: Diagnosis not present

## 2023-09-30 DIAGNOSIS — M5459 Other low back pain: Secondary | ICD-10-CM | POA: Insufficient documentation

## 2023-09-30 DIAGNOSIS — M6281 Muscle weakness (generalized): Secondary | ICD-10-CM | POA: Insufficient documentation

## 2023-09-30 NOTE — Therapy (Signed)
 OUTPATIENT PHYSICAL THERAPY THORACOLUMBAR EVALUATION   Patient Name: Kelly Santos MRN: [id number] DOB:Dec 19, 1945, 77 y.o., female Today's Date: 09/30/2023  END OF SESSION:  PT End of Session - 09/30/23 0950     Visit Number 1    Number of Visits 13    Date for PT Re-Evaluation 11/11/23    PT Start Time 0947    PT Stop Time 1030    PT Time Calculation (min) 43 min    Activity Tolerance Patient tolerated treatment well    Behavior During Therapy [facility] for tasks assessed/performed             Past Medical History:  Diagnosis Date   Anemia    Aortic atherosclerosis (HCC) 11/07/2021   CAD (coronary artery disease)    no cardiologist, followed by PCP only   Cataract    bil cataracts removed   Diverticulosis    Generalized headaches    GERD (gastroesophageal reflux disease)    Glaucoma    Hemorrhoids    Hiatal hernia    On CT done Jan 31, 2011    Hx of colonic polyps    Hypertension    Hyperthyroidism    Osteoarthritis    Uterine polyp    Vitamin D  deficiency    Past Surgical History:  Procedure Laterality Date   ABDOMINAL HYSTERECTOMY N/A 04/26/2013   Procedure: HYSTERECTOMY ABDOMINAL;  Surgeon: Rosaline LITTIE Cobble, MD;  Location: [facility];  Service: Gynecology;  Laterality: N/A;   COLONOSCOPY     DILATION AND CURETTAGE OF UTERUS  01/18/2013   with uterine polypectomy   SALPINGOOPHORECTOMY Bilateral 04/26/2013   Procedure: SALPINGO OOPHORECTOMY;  Surgeon: Rosaline LITTIE Cobble, MD;  Location: [facility];  Service: Gynecology;  Laterality: Bilateral;   UPPER GASTROINTESTINAL ENDOSCOPY     Patient Active Problem List   Diagnosis Date Noted   Aortic atherosclerosis (HCC) 11/07/2021   Left leg pain 06/14/2021   Goiter 01/25/2021   Hiatal hernia 06/09/2020   Dysphagia 06/09/2020   Chronic venous insufficiency 03/15/2020   New daily persistent headache 08/25/2019   Paresthesia 08/25/2019   Foot pain, right 03/24/2019   Epigastric pain 07/08/2018   Oral ulcer 05/22/2018   Neck  pain 05/08/2018   Pleural effusion 05/08/2018   RLQ abdominal pain 11/14/2017   Nausea 11/14/2017   Edema 02/28/2017   Upper respiratory infection 05/07/2016   Cough 05/07/2016   Eczema of face 01/22/2016   Fatigue 11/02/2015   Hyperglycemia 03/24/2015   Well adult exam 11/27/2012   Colon polyp, hyperplastic 11/27/2012   Low back pain 08/03/2012   Foot pain, left 04/14/2012   Callus of foot 04/14/2012   Leg pain, left 07/09/2011   Diastolic dysfunction 05/15/2011   Hypokalemia 05/15/2011   Thyrotoxicosis 03/26/2010   RECTAL BLEEDING 03/26/2010   Hemorrhoid 03/23/2010   Anemia due to chronic blood loss 03/23/2010   HERPES ZOSTER 11/09/2009   Febrile illness, acute 11/03/2009   Pain in limb 10/04/2009   Headache 10/04/2009   Tinea pedis 06/20/2009   ALLERGIC RHINITIS 12/15/2008   Chest pain, atypical 11/08/2008   CERVICAL RADICULOPATHY, RIGHT 03/25/2008   KNEE PAIN, ACUTE 01/18/2008   Vitamin D  deficiency 11/21/2007   ABNORMAL CHEST XRAY 11/21/2007   Hypertension, uncontrolled 11/18/2007   GERD 11/18/2007   DIVERTICULOSIS, COLON 11/18/2007   Osteoarthritis 11/18/2007    PCP: Garald Scrape MD  REFERRING PROVIDER: Joane Artist RAMAN, MD  REFERRING DIAG: M54.50,G89.29 (ICD-10-CM) - Chronic midline low back pain without sciatica  Rationale for Evaluation  and Treatment: Rehabilitation  THERAPY DIAG:  Other low back pain  Muscle weakness (generalized)  ONSET DATE: 08/17/23  SUBJECTIVE:                                                                                                                                                                                           SUBJECTIVE STATEMENT: Pt is a pleasant 77 y.o. female referred to OPPT for LBP.  PERTINENT HISTORY:  Pt reports onset of LBP when she bent over the shower in November. Her pain subsided after seeing Dr. Joane for her LBP. Her symptoms continue to wax and wane having a lot of back soreness near  Christmas. Reports she has had chronic LBP for many years off and on. 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 Describes LBP as sore, achey. Occasionally sharp. Worst pain up to 9/10 NPS. Sometimes pain free. No LBP currently. Has pain across her lower back into the tops of her hips. Denies radicular symptoms. Pain with bending over, being on her knees to pray to return to standing. Walks 3-4x/week for 30-40 minutes which seems to help.   PAIN:  Are you having pain? Yes: NPRS scale: 0/10 Pain location: R/L lower back and upper glutes Pain description: dull, achey, occasionally sharp Aggravating factors: Bending over, transfers Relieving factors: ambulation  PRECAUTIONS: None  RED FLAGS: Bowel or bladder incontinence: No and Cauda equina syndrome: No   WEIGHT BEARING RESTRICTIONS: No  FALLS:  Has patient fallen in last 6 months? Yes. Number of falls 1  LIVING ENVIRONMENT: Lives with: lives alone  OCCUPATION: Retired  PLOF: Independent  PATIENT GOALS: To improve pain and stay active  NEXT MD VISIT: N/A  OBJECTIVE:  Note: Objective measures were completed at Evaluation unless otherwise noted.  DIAGNOSTIC FINDINGS:  CLINICAL DATA:  Pain for years.   EXAM: LUMBAR SPINE - 3 VIEW   COMPARISON:  04/07/2023.   FINDINGS: There is no evidence of lumbar spine fracture. Alignment is normal. Disc space narrowing and marginal osteophytes at each lumbar level consistent with mild degenerative disc disease.   IMPRESSION: Ne mild multilevel degenerative changes. No acute osseous abnormalities.     Electronically Signed   By: Fonda Field M.D.   On: 09/08/2023 21:16      PATIENT SURVEYS:  FOTO Deferred.  COGNITION: Overall cognitive status: Within functional limits for tasks assessed     SENSATION: WFL  POSTURE: No Significant postural limitations  PALPATION: Significant bilat lumbar paraspinal tightness fronm sacrum to approximately  T12. Is TTP throughout R > L.   LUMBAR ROM:   AROM eval  Flexion 90%  Extension 25%*  Right lateral flexion 75%*  Left lateral flexion 75%*  Right rotation 50%*  Left rotation 50%   (Blank rows = not tested)  LOWER EXTREMITY ROM:     Active  Right eval Left eval  Hip flexion    Hip extension 15 12  Hip abduction    Hip adduction    Hip internal rotation    Hip external rotation    Knee flexion    Knee extension    Ankle dorsiflexion    Ankle plantarflexion    Ankle inversion    Ankle eversion     (Blank rows = not tested)  LOWER EXTREMITY MMT:    MMT Right eval Left eval  Hip flexion 4 4  Hip extension 3+ 4  Hip abduction 3+ 3+  Hip adduction    Hip internal rotation 4 4  Hip external rotation 5 5  Knee flexion 5 5  Knee extension 5 5  Ankle dorsiflexion 4 4  Ankle plantarflexion 5 5  Ankle inversion    Ankle eversion     (Blank rows = not tested)  LUMBAR SPECIAL TESTS:  FABER test: Negative FADDIR: Negative  Sacral thrust: Negative   JOINT MOBILITY: Hip AP: normal and painless bilat  Lumbar CPA's and UPA's (R > L) hypomobile with concordant pain sign L5-T12  FUNCTIONAL TESTS:  30 seconds chair stand test: 11 reps    6 MWT: Deferred to next session GAIT: Distance walked: 10 meters Assistive device utilized: None Level of assistance: Complete Independence Comments: Normal  TREATMENT DATE: 09/30/23                                                                                                                         Reviewed HEP  PATIENT EDUCATION:  Education details: HEP (reps/sets/frequency), prognosis, POC Person educated: Patient Education method: Explanation, Demonstration, and Handouts Education comprehension: verbalized understanding  HOME EXERCISE PROGRAM: Access Code: [email or web address] URL: [email or web address] Date: 09/30/2023 Prepared by: Dorina Kingfisher  Exercises - Supine Bridge  - 1 x daily - 4-5 x weekly - 3 sets -  8 reps - Supine Lower Trunk Rotation  - 1 x daily - 4-5 x weekly - 2 sets - 12 reps - Sidelying Hip Abduction  - 1 x daily - 4-5 x weekly - 2 sets - 12 reps - Standing Lumbar Extension with Counter  - 1 x daily - 7 x weekly - 2 sets - 12 reps - Standing Lumbar Extension at Wall - Forearms  - 1 x daily - 7 x weekly - 2 sets - 12 reps  ASSESSMENT:  CLINICAL IMPRESSION: Patient is a 77 y.o. women who was seen today for physical therapy evaluation and treatment for LBP. Pt presents with no red flag signs/symptoms or radicular symptoms. Pt presents with generalized proximal LE weakness as evidenced by 30 sec STS test and MMT and limitations with painful lumbar AROM in all planes but primarily in extension and rotation.  Pt also presents with concordant pain sign with lumbar CPA's and UPA's (R >L) indicative of painful lumbar hypomobility. These symptoms are leading to pain with ADL's like transfers and bending over ADL's. Pt will benefit from skilled PT services to address these deficits and maximize return to PLOF.  OBJECTIVE IMPAIRMENTS: decreased mobility, decreased ROM, decreased strength, hypomobility, and pain.   ACTIVITY LIMITATIONS: bending and sitting  PARTICIPATION LIMITATIONS: community activity  PERSONAL FACTORS: Age, Past/current experiences, Time since onset of injury/illness/exacerbation, and 3+ comorbidities: Anemia, HTN, OA, Glaucoma, Hyperthyroidism  are also affecting patient's functional outcome.   REHAB POTENTIAL: Good  CLINICAL DECISION MAKING: Evolving/moderate complexity  EVALUATION COMPLEXITY: Moderate   GOALS: Goals reviewed with patient? No  SHORT TERM GOALS: Target date: 10/20/22  Pt will be independent with HEP to improve LBP, mobility, and strength to return to PLOF. Baseline: 09/30/23 Goal status: INITIAL  LONG TERM GOALS: Target date: 11/11/23  Pt will improve FOTO to target score to demonstrate clinically significant improvement in functional  mobility. Baseline: 09/30/23: deferred to next session Goal status: INITIAL  2.  Pt will improve 30 sec STS to at least 13 reps for age matched norms to demonstrate improved LE strength and endurance for transfers and walking ADL's. Baseline: 09/30/23: 11 reps Goal status: INITIAL  3.  Pt will report worst pain as a 2/10 NPS with painful ADL's to demonstrate clinically significant improvement in pain. Baseline: 09/30/23: 9/10 NPS Goal status: INITIAL  PLAN:  PT FREQUENCY: 1-2x/week  PT DURATION: 6 weeks  PLANNED INTERVENTIONS: [id number]- PT Re-evaluation, 97110-Therapeutic exercises, 97530- Therapeutic activity, 97112- Neuromuscular re-education, 97535- Self Care, [id number]- Manual therapy, Patient/Family education, Joint mobilization, Joint manipulation, Spinal manipulation, Spinal mobilization, Cryotherapy, and Moist heat.  PLAN FOR NEXT SESSION: FOTO for lumbar, reassess HEP. Hip strengthening. Screen 6 MWT.    Dorina HERO. Fairly IV, PT, DPT Physical Therapist- [place]  [facility]  09/30/2023, 12:36 PM

## 2023-10-07 ENCOUNTER — Ambulatory Visit: Payer: Medicare Other | Admitting: Family Medicine

## 2023-10-10 ENCOUNTER — Ambulatory Visit: Payer: Medicare Other | Attending: Family Medicine

## 2023-10-10 DIAGNOSIS — M5459 Other low back pain: Secondary | ICD-10-CM | POA: Insufficient documentation

## 2023-10-10 DIAGNOSIS — M6281 Muscle weakness (generalized): Secondary | ICD-10-CM | POA: Diagnosis not present

## 2023-10-10 NOTE — Therapy (Signed)
 OUTPATIENT PHYSICAL THERAPY THORACOLUMBAR TREATMENT   Patient Name: Kelly Santos MRN: [id number] DOB:1946-03-08, 78 y.o., female Today's Date: 10/10/2023  END OF SESSION:  PT End of Session - 10/10/23 1121     Visit Number 2    Number of Visits 13    Date for PT Re-Evaluation 11/11/23    PT Start Time 1115    PT Stop Time 1200    PT Time Calculation (min) 45 min    Activity Tolerance Patient tolerated treatment well    Behavior During Therapy [facility] for tasks assessed/performed             Past Medical History:  Diagnosis Date   Anemia    Aortic atherosclerosis (HCC) 11/07/2021   CAD (coronary artery disease)    no cardiologist, followed by PCP only   Cataract    bil cataracts removed   Diverticulosis    Generalized headaches    GERD (gastroesophageal reflux disease)    Glaucoma    Hemorrhoids    Hiatal hernia    On CT done Jan 31, 2011    Hx of colonic polyps    Hypertension    Hyperthyroidism    Osteoarthritis    Uterine polyp    Vitamin D  deficiency    Past Surgical History:  Procedure Laterality Date   ABDOMINAL HYSTERECTOMY N/A 04/26/2013   Procedure: HYSTERECTOMY ABDOMINAL;  Surgeon: Rosaline LITTIE Cobble, MD;  Location: [facility];  Service: Gynecology;  Laterality: N/A;   COLONOSCOPY     DILATION AND CURETTAGE OF UTERUS  01/18/2013   with uterine polypectomy   SALPINGOOPHORECTOMY Bilateral 04/26/2013   Procedure: SALPINGO OOPHORECTOMY;  Surgeon: Rosaline LITTIE Cobble, MD;  Location: [facility];  Service: Gynecology;  Laterality: Bilateral;   UPPER GASTROINTESTINAL ENDOSCOPY     Patient Active Problem List   Diagnosis Date Noted   Aortic atherosclerosis (HCC) 11/07/2021   Left leg pain 06/14/2021   Goiter 01/25/2021   Hiatal hernia 06/09/2020   Dysphagia 06/09/2020   Chronic venous insufficiency 03/15/2020   New daily persistent headache 08/25/2019   Paresthesia 08/25/2019   Foot pain, right 03/24/2019   Epigastric pain 07/08/2018   Oral ulcer 05/22/2018   Neck  pain 05/08/2018   Pleural effusion 05/08/2018   RLQ abdominal pain 11/14/2017   Nausea 11/14/2017   Edema 02/28/2017   Upper respiratory infection 05/07/2016   Cough 05/07/2016   Eczema of face 01/22/2016   Fatigue 11/02/2015   Hyperglycemia 03/24/2015   Well adult exam 11/27/2012   Colon polyp, hyperplastic 11/27/2012   Low back pain 08/03/2012   Foot pain, left 04/14/2012   Callus of foot 04/14/2012   Leg pain, left 07/09/2011   Diastolic dysfunction 05/15/2011   Hypokalemia 05/15/2011   Thyrotoxicosis 03/26/2010   RECTAL BLEEDING 03/26/2010   Hemorrhoid 03/23/2010   Anemia due to chronic blood loss 03/23/2010   HERPES ZOSTER 11/09/2009   Febrile illness, acute 11/03/2009   Pain in limb 10/04/2009   Headache 10/04/2009   Tinea pedis 06/20/2009   ALLERGIC RHINITIS 12/15/2008   Chest pain, atypical 11/08/2008   CERVICAL RADICULOPATHY, RIGHT 03/25/2008   KNEE PAIN, ACUTE 01/18/2008   Vitamin D  deficiency 11/21/2007   ABNORMAL CHEST XRAY 11/21/2007   Hypertension, uncontrolled 11/18/2007   GERD 11/18/2007   DIVERTICULOSIS, COLON 11/18/2007   Osteoarthritis 11/18/2007    PCP: Garald Scrape MD  REFERRING PROVIDER: Joane Artist RAMAN, MD  REFERRING DIAG: M54.50,G89.29 (ICD-10-CM) - Chronic midline low back pain without sciatica  Rationale for Evaluation  and Treatment: Rehabilitation  THERAPY DIAG:  Other low back pain  Muscle weakness (generalized)  ONSET DATE: 08/17/23  SUBJECTIVE:                                                                                                                                                                                           SUBJECTIVE STATEMENT: Pt reports HEP compliance. Sore from her exercises.   PERTINENT HISTORY:  Pt reports onset of LBP when she bent over the shower in November. Her pain subsided after seeing Dr. Joane for her LBP. Her symptoms continue to wax and wane having a lot of back soreness near Christmas.  Reports she has had chronic LBP for many years off and on. 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 Describes LBP as sore, achey. Occasionally sharp. Worst pain up to 9/10 NPS. Sometimes pain free. No LBP currently. Has pain across her lower back into the tops of her hips. Denies radicular symptoms. Pain with bending over, being on her knees to pray to return to standing. Walks 3-4x/week for 30-40 minutes which seems to help.   PAIN:  Are you having pain? Yes: NPRS scale: 0/10 Pain location: R/L lower back and upper glutes Pain description: dull, achey, occasionally sharp Aggravating factors: Bending over, transfers Relieving factors: ambulation  PRECAUTIONS: None  RED FLAGS: Bowel or bladder incontinence: No and Cauda equina syndrome: No   WEIGHT BEARING RESTRICTIONS: No  FALLS:  Has patient fallen in last 6 months? Yes. Number of falls 1  LIVING ENVIRONMENT: Lives with: lives alone  OCCUPATION: Retired  PLOF: Independent  PATIENT GOALS: To improve pain and stay active  NEXT MD VISIT: N/A  OBJECTIVE:  Note: Objective measures were completed at Evaluation unless otherwise noted.  DIAGNOSTIC FINDINGS:  CLINICAL DATA:  Pain for years.   EXAM: LUMBAR SPINE - 3 VIEW   COMPARISON:  04/07/2023.   FINDINGS: There is no evidence of lumbar spine fracture. Alignment is normal. Disc space narrowing and marginal osteophytes at each lumbar level consistent with mild degenerative disc disease.   IMPRESSION: Ne mild multilevel degenerative changes. No acute osseous abnormalities.     Electronically Signed   By: Fonda Field M.D.   On: 09/08/2023 21:16      PATIENT SURVEYS:  FOTO 69/70  COGNITION: Overall cognitive status: Within functional limits for tasks assessed     SENSATION: WFL  POSTURE: No Significant postural limitations  PALPATION: Significant bilat lumbar paraspinal tightness from sacrum to approximately T12. Is TTP  throughout R > L.   LUMBAR ROM:   AROM eval  Flexion 90%  Extension 25%*  Right lateral flexion 75%*  Left lateral flexion 75%*  Right rotation 50%*  Left rotation 50%   (Blank rows = not tested)  LOWER EXTREMITY ROM:     Active  Right eval Left eval  Hip flexion    Hip extension 15 12  Hip abduction    Hip adduction    Hip internal rotation    Hip external rotation    Knee flexion    Knee extension    Ankle dorsiflexion    Ankle plantarflexion    Ankle inversion    Ankle eversion     (Blank rows = not tested)  LOWER EXTREMITY MMT:    MMT Right eval Left eval  Hip flexion 4 4  Hip extension 3+ 4  Hip abduction 3+ 3+  Hip adduction    Hip internal rotation 4 4  Hip external rotation 5 5  Knee flexion 5 5  Knee extension 5 5  Ankle dorsiflexion 4 4  Ankle plantarflexion 5 5  Ankle inversion    Ankle eversion     (Blank rows = not tested)  LUMBAR SPECIAL TESTS:  FABER test: Negative FADDIR: Negative  Sacral thrust: Negative   JOINT MOBILITY: Hip AP: normal and painless bilat  Lumbar CPA's and UPA's (R > L) hypomobile with concordant pain sign L5-T12  FUNCTIONAL TESTS:  30 seconds chair stand test: 11 reps    6 MWT: Deferred to next session; 10/10/23: 1,550' GAIT: Distance walked: 10 meters Assistive device utilized: None Level of assistance: Complete Independence Comments: Normal  TREATMENT DATE: 10/09/22                                                                                                                         There.ex: FOTO: 69/70 6 MWT: Met 1500'. [phone] for age matched norms for female in 42th decade of life)  Reviewed HEP as listed below:  Access Code: [email or web address] URL: [email or web address] Date: 09/30/2023 Prepared by: Dorina Kingfisher  Exercises - Supine Bridge  - 1 x daily - 4-5 x weekly - 3 sets - 8 reps - Supine Lower Trunk Rotation  - 1 x daily - 4-5 x weekly - 2 sets - 12 reps - Sidelying Hip Abduction  - 1 x  daily - 4-5 x weekly - 2 sets - 12 reps (RLE 2x6-8) - Standing Lumbar Extension with Counter  - 1 x daily - 7 x weekly - 2 sets - 12 reps - Standing Lumbar Extension at Wall - Forearms  - 1 x daily - 7 x weekly - 2 sets - 12 reps   PATIENT EDUCATION:  Education details: HEP (reps/sets/frequency), prognosis, POC Person educated: Patient Education method: Explanation, Demonstration, and Handouts Education comprehension: verbalized understanding  HOME EXERCISE PROGRAM:  Access Code: [email or web address] URL: [email or web address] Date: 10/10/2023 Prepared by: Dorina Kingfisher  Exercises - Supine Bridge  - 1 x daily - 4-5 x weekly - 3 sets - 8 reps - Supine Lower Trunk Rotation  -  1 x daily - 4-5 x weekly - 2 sets - 12 reps - Sidelying Hip Abduction  - 1 x daily - 4-5 x weekly - 2 sets - 12 reps - Standing Lumbar Extension with Counter  - 1 x daily - 7 x weekly - 2 sets - 12 reps  Access Code: [email or web address] URL: [email or web address] Date: 09/30/2023 Prepared by: Dorina Kingfisher  Exercises - Supine Bridge  - 1 x daily - 4-5 x weekly - 3 sets - 8 reps - Supine Lower Trunk Rotation  - 1 x daily - 4-5 x weekly - 2 sets - 12 reps - Sidelying Hip Abduction  - 1 x daily - 4-5 x weekly - 2 sets - 12 reps - Standing Lumbar Extension with Counter  - 1 x daily - 7 x weekly - 2 sets - 12 reps - Standing Lumbar Extension at Wall - Forearms  - 1 x daily - 7 x weekly - 2 sets - 12 reps  ASSESSMENT:  CLINICAL IMPRESSION: Focus of session on completing evaluation topics and screening and HEP review. Pt near expected FOTO score for functional mobility and scoring a normal walking distance for sex and age matched norms. Pt did require min VC's for HEP completion but generally performed well. Pt does demo R hip abductor weakness compared to LLE with inability to complete 2x12. Adjusted to 2x8. Pt with improved understanding with HEP. Will trial manual intervention next visit to address joint  hypomobility found on exam and assess ROM response. Pt will benefit from skilled PT services to address these deficits and maximize return to PLOF.  OBJECTIVE IMPAIRMENTS: decreased mobility, decreased ROM, decreased strength, hypomobility, and pain.   ACTIVITY LIMITATIONS: bending and sitting  PARTICIPATION LIMITATIONS: community activity  PERSONAL FACTORS: Age, Past/current experiences, Time since onset of injury/illness/exacerbation, and 3+ comorbidities: Anemia, HTN, OA, Glaucoma, Hyperthyroidism  are also affecting patient's functional outcome.   REHAB POTENTIAL: Good  CLINICAL DECISION MAKING: Evolving/moderate complexity  EVALUATION COMPLEXITY: Moderate   GOALS: Goals reviewed with patient? No  SHORT TERM GOALS: Target date: 10/20/22  Pt will be independent with HEP to improve LBP, mobility, and strength to return to PLOF. Baseline: 09/30/23 Goal status: INITIAL  LONG TERM GOALS: Target date: 11/11/23  Pt will improve FOTO to target score to demonstrate clinically significant improvement in functional mobility. Baseline: 09/30/23: deferred to next session; 10/10/23: 69/70 Goal status: INITIAL  2.  Pt will improve 30 sec STS to at least 13 reps for age matched norms to demonstrate improved LE strength and endurance for transfers and walking ADL's. Baseline: 09/30/23: 11 reps Goal status: INITIAL  3.  Pt will report worst pain as a 2/10 NPS with painful ADL's to demonstrate clinically significant improvement in pain. Baseline: 09/30/23: 9/10 NPS Goal status: INITIAL  PLAN:  PT FREQUENCY: 1-2x/week  PT DURATION: 6 weeks  PLANNED INTERVENTIONS: 97164- PT Re-evaluation, 97110-Therapeutic exercises, 97530- Therapeutic activity, 97112- Neuromuscular re-education, 97535- Self Care, [id number]- Manual therapy, Patient/Family education, Joint mobilization, Joint manipulation, Spinal manipulation, Spinal mobilization, Cryotherapy, and Moist heat.  PLAN FOR NEXT SESSION: Manual  CPA's/UPA's for lumbar spine. Lumbar mobility/hip strength.    Dorina HERO. Fairly IV, PT, DPT Physical Therapist- [place]  [facility]  10/10/2023, 12:05 PM

## 2023-10-17 ENCOUNTER — Ambulatory Visit: Payer: Medicare Other

## 2023-10-17 DIAGNOSIS — M5459 Other low back pain: Secondary | ICD-10-CM

## 2023-10-17 DIAGNOSIS — M6281 Muscle weakness (generalized): Secondary | ICD-10-CM | POA: Diagnosis not present

## 2023-10-17 NOTE — Therapy (Signed)
OUTPATIENT PHYSICAL THERAPY THORACOLUMBAR TREATMENT   Patient Name: Kelly Santos MRN: [id number] DOB:[birth year], 78 y.o., female Today's Date: 10/17/2023  END OF SESSION:  PT End of Session - 10/17/23 0905     Visit Number 3    Number of Visits 13    Date for PT Re-Evaluation 11/11/23    PT Start Time 0900    PT Stop Time 0944    PT Time Calculation (min) 44 min    Activity Tolerance Patient tolerated treatment well    Behavior During Therapy [facility] for tasks assessed/performed             Past Medical History:  Diagnosis Date   Anemia    Aortic atherosclerosis (HCC) 11/07/2021   CAD (coronary artery disease)    no cardiologist, followed by PCP only   Cataract    bil cataracts removed   Diverticulosis    Generalized headaches    GERD (gastroesophageal reflux disease)    Glaucoma    Hemorrhoids    Hiatal hernia    On CT done Jan 31, 2011    Hx of colonic polyps    Hypertension    Hyperthyroidism    Osteoarthritis    Uterine polyp    Vitamin D deficiency    Past Surgical History:  Procedure Laterality Date   ABDOMINAL HYSTERECTOMY N/A 04/26/2013   Procedure: HYSTERECTOMY ABDOMINAL;  Surgeon: Jeani Hawking, MD;  Location: [facility];  Service: Gynecology;  Laterality: N/A;   COLONOSCOPY     DILATION AND CURETTAGE OF UTERUS  01/18/2013   with uterine polypectomy   SALPINGOOPHORECTOMY Bilateral 04/26/2013   Procedure: SALPINGO OOPHORECTOMY;  Surgeon: Jeani Hawking, MD;  Location: [facility];  Service: Gynecology;  Laterality: Bilateral;   UPPER GASTROINTESTINAL ENDOSCOPY     Patient Active Problem List   Diagnosis Date Noted   Aortic atherosclerosis (HCC) 11/07/2021   Left leg pain 06/14/2021   Goiter 01/25/2021   Hiatal hernia 06/09/2020   Dysphagia 06/09/2020   Chronic venous insufficiency 03/15/2020   New daily persistent headache 08/25/2019   Paresthesia 08/25/2019   Foot pain, right 03/24/2019   Epigastric pain 07/08/2018   Oral ulcer 05/22/2018   Neck  pain 05/08/2018   Pleural effusion 05/08/2018   RLQ abdominal pain 11/14/2017   Nausea 11/14/2017   Edema 02/28/2017   Upper respiratory infection 05/07/2016   Cough 05/07/2016   Eczema of face 01/22/2016   Fatigue 11/02/2015   Hyperglycemia 03/24/2015   Well adult exam 11/27/2012   Colon polyp, hyperplastic 11/27/2012   Low back pain 08/03/2012   Foot pain, left 04/14/2012   Callus of foot 04/14/2012   Leg pain, left 07/09/2011   Diastolic dysfunction 05/15/2011   Hypokalemia 05/15/2011   Thyrotoxicosis 03/26/2010   RECTAL BLEEDING 03/26/2010   Hemorrhoid 03/23/2010   Anemia due to chronic blood loss 03/23/2010   HERPES ZOSTER 11/09/2009   Febrile illness, acute 11/03/2009   Pain in limb 10/04/2009   Headache 10/04/2009   Tinea pedis 06/20/2009   ALLERGIC RHINITIS 12/15/2008   Chest pain, atypical 11/08/2008   CERVICAL RADICULOPATHY, RIGHT 03/25/2008   KNEE PAIN, ACUTE 01/18/2008   Vitamin D deficiency 11/21/2007   ABNORMAL CHEST XRAY 11/21/2007   Hypertension, uncontrolled 11/18/2007   GERD 11/18/2007   DIVERTICULOSIS, COLON 11/18/2007   Osteoarthritis 11/18/2007    PCP: Jacinta Shoe MD  REFERRING PROVIDER: Rodolph Bong, MD  REFERRING DIAG: M54.50,G89.29 (ICD-10-CM) - Chronic midline low back pain without sciatica  Rationale for Evaluation  and Treatment: Rehabilitation  THERAPY DIAG:  Other low back pain  Muscle weakness (generalized)  ONSET DATE: 08/17/23  SUBJECTIVE:                                                                                                                                                                                           SUBJECTIVE STATEMENT: Pt reports HEP compliance. Thinks her pain is improving with less severity of symptoms.  Walked 2 miles yesterday.  PERTINENT HISTORY:  Pt reports onset of LBP when she bent over the shower in November. Her pain subsided after seeing Dr. Denyse Amass for her LBP. Her symptoms continue  to wax and wane having a lot of back soreness near Christmas. Reports she has had chronic LBP for many years off and on. 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 Describes LBP as sore, achey. Occasionally sharp. Worst pain up to 9/10 NPS. Sometimes pain free. No LBP currently. Has pain across her lower back into the tops of her hips. Denies radicular symptoms. Pain with bending over, being on her knees to pray to return to standing. Walks 3-4x/week for 30-40 minutes which seems to help.   PAIN:  Are you having pain? Yes: NPRS scale: 0/10 Pain location: R/L lower back and upper glutes Pain description: dull, achey, occasionally sharp Aggravating factors: Bending over, transfers Relieving factors: ambulation  PRECAUTIONS: None  RED FLAGS: Bowel or bladder incontinence: No and Cauda equina syndrome: No   WEIGHT BEARING RESTRICTIONS: No  FALLS:  Has patient fallen in last 6 months? Yes. Number of falls 1  LIVING ENVIRONMENT: Lives with: lives alone  OCCUPATION: Retired  PLOF: Independent  PATIENT GOALS: To improve pain and stay active  NEXT MD VISIT: N/A  OBJECTIVE:  Note: Objective measures were completed at Evaluation unless otherwise noted.  DIAGNOSTIC FINDINGS:  CLINICAL DATA:  Pain for years.   EXAM: LUMBAR SPINE - 3 VIEW   COMPARISON:  04/07/2023.   FINDINGS: There is no evidence of lumbar spine fracture. Alignment is normal. Disc space narrowing and marginal osteophytes at each lumbar level consistent with mild degenerative disc disease.   IMPRESSION: Ne mild multilevel degenerative changes. No acute osseous abnormalities.     Electronically Signed   By: Layla Maw M.D.   On: 09/08/2023 21:16      PATIENT SURVEYS:  FOTO 69/70  COGNITION: Overall cognitive status: Within functional limits for tasks assessed     SENSATION: WFL  POSTURE: No Significant postural limitations  PALPATION: Significant bilat lumbar  paraspinal tightness from sacrum to approximately T12. Is TTP throughout R > L.  LUMBAR ROM:   AROM eval  Flexion 90%  Extension 25%*  Right lateral flexion 75%*  Left lateral flexion 75%*  Right rotation 50%*  Left rotation 50%   (Blank rows = not tested)  LOWER EXTREMITY ROM:     Active  Right eval Left eval  Hip flexion    Hip extension 15 12  Hip abduction    Hip adduction    Hip internal rotation    Hip external rotation    Knee flexion    Knee extension    Ankle dorsiflexion    Ankle plantarflexion    Ankle inversion    Ankle eversion     (Blank rows = not tested)  LOWER EXTREMITY MMT:    MMT Right eval Left eval  Hip flexion 4 4  Hip extension 3+ 4  Hip abduction 3+ 3+  Hip adduction    Hip internal rotation 4 4  Hip external rotation 5 5  Knee flexion 5 5  Knee extension 5 5  Ankle dorsiflexion 4 4  Ankle plantarflexion 5 5  Ankle inversion    Ankle eversion     (Blank rows = not tested)  LUMBAR SPECIAL TESTS:  FABER test: Negative FADDIR: Negative  Sacral thrust: Negative   JOINT MOBILITY: Hip AP: normal and painless bilat  Lumbar CPA's and UPA's (R > L) hypomobile with concordant pain sign L5-T12  FUNCTIONAL TESTS:  30 seconds chair stand test: 11 reps    6 MWT: Deferred to next session; 10/10/23: 1,550' GAIT: Distance walked: 10 meters Assistive device utilized: None Level of assistance: Complete Independence Comments: Normal  TREATMENT DATE: 10/16/22                                                                                                                         There.ex: Nu-Step L3 for 5 min BUE and LE use for lumbar warm up.   Side lying hip abduction: 3x8/LE   Hook lying:   LTR's: 2x12/side  Bridges + glut med isometric with RTB at distal femurs: 3x8  TRX squat to grey chair for TC's for hip hinging. 3x12. PT demo, initial min VC's for form/technique with good carryover after.   Seated physioball roll outs forwards,  R/L deviations: x10/direction.   Lumbar extension on wall: 2x12  PATIENT EDUCATION:  Education details: HEP (reps/sets/frequency), prognosis, POC Person educated: Patient Education method: Explanation, Demonstration, and Handouts Education comprehension: verbalized understanding  HOME EXERCISE PROGRAM: Access Code: [email or web address] URL: [email or web address] Date: 10/10/2023 Prepared by: Ronnie Derby  Exercises - Supine Bridge  - 1 x daily - 4-5 x weekly - 3 sets - 8 reps - Supine Lower Trunk Rotation  - 1 x daily - 4-5 x weekly - 2 sets - 12 reps - Sidelying Hip Abduction  - 1 x daily - 4-5 x weekly - 2 sets - 12 reps - Standing Lumbar Extension with Counter  - 1 x daily - 7 x weekly - 2 sets - 12  reps  Access Code: [email or web address] URL: [email or web address] Date: 09/30/2023 Prepared by: Ronnie Derby  Exercises - Supine Bridge  - 1 x daily - 4-5 x weekly - 3 sets - 8 reps - Supine Lower Trunk Rotation  - 1 x daily - 4-5 x weekly - 2 sets - 12 reps - Sidelying Hip Abduction  - 1 x daily - 4-5 x weekly - 2 sets - 12 reps - Standing Lumbar Extension with Counter  - 1 x daily - 7 x weekly - 2 sets - 12 reps - Standing Lumbar Extension at Wall - Forearms  - 1 x daily - 7 x weekly - 2 sets - 12 reps  ASSESSMENT:  CLINICAL IMPRESSION: Continuing PT POC with focus on improving lumbar AROM and proximal hip strength. Pt tolerating progression in gluteal strength with additional resistance band with bridging to assist in notable R hip abductor muscle weakness. Form/technique improving overall indicative of HEP compliance in home setting. Pt subjectively reporting improvement in LBP with activity. Pt will benefit from skilled PT services to address these deficits and maximize return to PLOF.  OBJECTIVE IMPAIRMENTS: decreased mobility, decreased ROM, decreased strength, hypomobility, and pain.   ACTIVITY LIMITATIONS: bending and sitting  PARTICIPATION LIMITATIONS:  community activity  PERSONAL FACTORS: Age, Past/current experiences, Time since onset of injury/illness/exacerbation, and 3+ comorbidities: Anemia, HTN, OA, Glaucoma, Hyperthyroidism  are also affecting patient's functional outcome.   REHAB POTENTIAL: Good  CLINICAL DECISION MAKING: Evolving/moderate complexity  EVALUATION COMPLEXITY: Moderate   GOALS: Goals reviewed with patient? No  SHORT TERM GOALS: Target date: 10/20/22  Pt will be independent with HEP to improve LBP, mobility, and strength to return to PLOF. Baseline: 09/30/23 Goal status: INITIAL  LONG TERM GOALS: Target date: 11/11/23  Pt will improve FOTO to target score to demonstrate clinically significant improvement in functional mobility. Baseline: 09/30/23: deferred to next session; 10/10/23: 69/70 Goal status: INITIAL  2.  Pt will improve 30 sec STS to at least 13 reps for age matched norms to demonstrate improved LE strength and endurance for transfers and walking ADL's. Baseline: 09/30/23: 11 reps Goal status: INITIAL  3.  Pt will report worst pain as a 2/10 NPS with painful ADL's to demonstrate clinically significant improvement in pain. Baseline: 09/30/23: 9/10 NPS Goal status: INITIAL  PLAN:  PT FREQUENCY: 1-2x/week  PT DURATION: 6 weeks  PLANNED INTERVENTIONS: 97164- PT Re-evaluation, 97110-Therapeutic exercises, 97530- Therapeutic activity, 97112- Neuromuscular re-education, 97535- Self Care, 69629- Manual therapy, Patient/Family education, Joint mobilization, Joint manipulation, Spinal manipulation, Spinal mobilization, Cryotherapy, and Moist heat.  PLAN FOR NEXT SESSION: Lumbar mobility/hip strength. Add core stability exercise.    Delphia Grates. Fairly IV, PT, DPT Physical Therapist- [place]  [facility]  10/17/2023, 9:54 AM

## 2023-10-22 ENCOUNTER — Ambulatory Visit: Payer: Medicare Other

## 2023-10-22 DIAGNOSIS — M5459 Other low back pain: Secondary | ICD-10-CM

## 2023-10-22 DIAGNOSIS — M6281 Muscle weakness (generalized): Secondary | ICD-10-CM

## 2023-10-22 NOTE — Therapy (Signed)
OUTPATIENT PHYSICAL THERAPY THORACOLUMBAR TREATMENT   Patient Name: Kelly Santos MRN: [id number] DOB:02-Dec-1945, 78 y.o., female Today's Date: 10/22/2023  END OF SESSION:  PT End of Session - 10/22/23 1308     Visit Number 4    Number of Visits 13    Date for PT Re-Evaluation 11/11/23    PT Start Time 1300    PT Stop Time 1345    PT Time Calculation (min) 45 min    Activity Tolerance Patient tolerated treatment well    Behavior During Therapy [facility] for tasks assessed/performed             Past Medical History:  Diagnosis Date   Anemia    Aortic atherosclerosis (HCC) 11/07/2021   CAD (coronary artery disease)    no cardiologist, followed by PCP only   Cataract    bil cataracts removed   Diverticulosis    Generalized headaches    GERD (gastroesophageal reflux disease)    Glaucoma    Hemorrhoids    Hiatal hernia    On CT done Jan 31, 2011    Hx of colonic polyps    Hypertension    Hyperthyroidism    Osteoarthritis    Uterine polyp    Vitamin D deficiency    Past Surgical History:  Procedure Laterality Date   ABDOMINAL HYSTERECTOMY N/A 04/26/2013   Procedure: HYSTERECTOMY ABDOMINAL;  Surgeon: Jeani Hawking, MD;  Location: [facility];  Service: Gynecology;  Laterality: N/A;   COLONOSCOPY     DILATION AND CURETTAGE OF UTERUS  01/18/2013   with uterine polypectomy   SALPINGOOPHORECTOMY Bilateral 04/26/2013   Procedure: SALPINGO OOPHORECTOMY;  Surgeon: Jeani Hawking, MD;  Location: [facility];  Service: Gynecology;  Laterality: Bilateral;   UPPER GASTROINTESTINAL ENDOSCOPY     Patient Active Problem List   Diagnosis Date Noted   Aortic atherosclerosis (HCC) 11/07/2021   Left leg pain 06/14/2021   Goiter 01/25/2021   Hiatal hernia 06/09/2020   Dysphagia 06/09/2020   Chronic venous insufficiency 03/15/2020   New daily persistent headache 08/25/2019   Paresthesia 08/25/2019   Foot pain, right 03/24/2019   Epigastric pain 07/08/2018   Oral ulcer 05/22/2018   Neck  pain 05/08/2018   Pleural effusion 05/08/2018   RLQ abdominal pain 11/14/2017   Nausea 11/14/2017   Edema 02/28/2017   Upper respiratory infection 05/07/2016   Cough 05/07/2016   Eczema of face 01/22/2016   Fatigue 11/02/2015   Hyperglycemia 03/24/2015   Well adult exam 11/27/2012   Colon polyp, hyperplastic 11/27/2012   Low back pain 08/03/2012   Foot pain, left 04/14/2012   Callus of foot 04/14/2012   Leg pain, left 07/09/2011   Diastolic dysfunction 05/15/2011   Hypokalemia 05/15/2011   Thyrotoxicosis 03/26/2010   RECTAL BLEEDING 03/26/2010   Hemorrhoid 03/23/2010   Anemia due to chronic blood loss 03/23/2010   HERPES ZOSTER 11/09/2009   Febrile illness, acute 11/03/2009   Pain in limb 10/04/2009   Headache 10/04/2009   Tinea pedis 06/20/2009   ALLERGIC RHINITIS 12/15/2008   Chest pain, atypical 11/08/2008   CERVICAL RADICULOPATHY, RIGHT 03/25/2008   KNEE PAIN, ACUTE 01/18/2008   Vitamin D deficiency 11/21/2007   ABNORMAL CHEST XRAY 11/21/2007   Hypertension, uncontrolled 11/18/2007   GERD 11/18/2007   DIVERTICULOSIS, COLON 11/18/2007   Osteoarthritis 11/18/2007    PCP: Jacinta Shoe MD  REFERRING PROVIDER: Rodolph Bong, MD  REFERRING DIAG: M54.50,G89.29 (ICD-10-CM) - Chronic midline low back pain without sciatica  Rationale for Evaluation  and Treatment: Rehabilitation  THERAPY DIAG:  Other low back pain  Muscle weakness (generalized)  ONSET DATE: 08/17/23  SUBJECTIVE:                                                                                                                                                                                           SUBJECTIVE STATEMENT: Pt reports HEP compliance. Has not had any LBP since last visit.   PERTINENT HISTORY:  Pt reports onset of LBP when she bent over the shower in November. Her pain subsided after seeing Dr. Denyse Amass for her LBP. Her symptoms continue to wax and wane having a lot of back soreness near  Christmas. Reports she has had chronic LBP for many years off and on. 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 Describes LBP as sore, achey. Occasionally sharp. Worst pain up to 9/10 NPS. Sometimes pain free. No LBP currently. Has pain across her lower back into the tops of her hips. Denies radicular symptoms. Pain with bending over, being on her knees to pray to return to standing. Walks 3-4x/week for 30-40 minutes which seems to help.   PAIN:  Are you having pain? Yes: NPRS scale: 0/10 Pain location: R/L lower back and upper glutes Pain description: dull, achey, occasionally sharp Aggravating factors: Bending over, transfers Relieving factors: ambulation  PRECAUTIONS: None  RED FLAGS: Bowel or bladder incontinence: No and Cauda equina syndrome: No   WEIGHT BEARING RESTRICTIONS: No  FALLS:  Has patient fallen in last 6 months? Yes. Number of falls 1  LIVING ENVIRONMENT: Lives with: lives alone  OCCUPATION: Retired  PLOF: Independent  PATIENT GOALS: To improve pain and stay active  NEXT MD VISIT: N/A  OBJECTIVE:  Note: Objective measures were completed at Evaluation unless otherwise noted.  DIAGNOSTIC FINDINGS:  CLINICAL DATA:  Pain for years.   EXAM: LUMBAR SPINE - 3 VIEW   COMPARISON:  04/07/2023.   FINDINGS: There is no evidence of lumbar spine fracture. Alignment is normal. Disc space narrowing and marginal osteophytes at each lumbar level consistent with mild degenerative disc disease.   IMPRESSION: Ne mild multilevel degenerative changes. No acute osseous abnormalities.     Electronically Signed   By: Layla Maw M.D.   On: 09/08/2023 21:16      PATIENT SURVEYS:  FOTO 69/70  COGNITION: Overall cognitive status: Within functional limits for tasks assessed     SENSATION: WFL  POSTURE: No Significant postural limitations  PALPATION: Significant bilat lumbar paraspinal tightness from sacrum to approximately T12.  Is TTP throughout R > L.   LUMBAR ROM:   AROM  eval  Flexion 90%  Extension 25%*  Right lateral flexion 75%*  Left lateral flexion 75%*  Right rotation 50%*  Left rotation 50%   (Blank rows = not tested)  LOWER EXTREMITY ROM:     Active  Right eval Left eval  Hip flexion    Hip extension 15 12  Hip abduction    Hip adduction    Hip internal rotation    Hip external rotation    Knee flexion    Knee extension    Ankle dorsiflexion    Ankle plantarflexion    Ankle inversion    Ankle eversion     (Blank rows = not tested)  LOWER EXTREMITY MMT:    MMT Right eval Left eval  Hip flexion 4 4  Hip extension 3+ 4  Hip abduction 3+ 3+  Hip adduction    Hip internal rotation 4 4  Hip external rotation 5 5  Knee flexion 5 5  Knee extension 5 5  Ankle dorsiflexion 4 4  Ankle plantarflexion 5 5  Ankle inversion    Ankle eversion     (Blank rows = not tested)  LUMBAR SPECIAL TESTS:  FABER test: Negative FADDIR: Negative  Sacral thrust: Negative   JOINT MOBILITY: Hip AP: normal and painless bilat  Lumbar CPA's and UPA's (R > L) hypomobile with concordant pain sign L5-T12  FUNCTIONAL TESTS:  30 seconds chair stand test: 11 reps    6 MWT: Deferred to next session; 10/10/23: 1,550' GAIT: Distance walked: 10 meters Assistive device utilized: None Level of assistance: Complete Independence Comments: Normal  TREATMENT DATE: 10/22/23                                                                                                                         There.ex: Nu-Step L5 for 5 min BUE and LE use for lumbar warm up.   Seated physioball roll outs: x10 forwards and R/L deviations.   Side lying hip abduction: 3x8/LE   50# sled pushes: 4x10 meters. Hip and knee extension strength.   6" lateral step ups: 2x12/LE.   RTB anti rotation paloff press: 5 reps, 4 steps R/L per side. Mod multimodal cuing for form/technique.   Prone lumbar extension: x12  PATIENT  EDUCATION:  Education details: HEP (reps/sets/frequency), prognosis, POC Person educated: Patient Education method: Explanation, Demonstration, and Handouts Education comprehension: verbalized understanding  HOME EXERCISE PROGRAM: Access Code: [email or web address] URL: [email or web address] Date: 10/10/2023 Prepared by: Ronnie Derby  Exercises - Supine Bridge  - 1 x daily - 4-5 x weekly - 3 sets - 8 reps - Supine Lower Trunk Rotation  - 1 x daily - 4-5 x weekly - 2 sets - 12 reps - Sidelying Hip Abduction  - 1 x daily - 4-5 x weekly - 2 sets - 12 reps - Standing Lumbar Extension with Counter  - 1 x daily - 7 x weekly - 2 sets - 12 reps  Access Code: [email or web address] URL: [email or web address] Date: 09/30/2023 Prepared  by: Ronnie Derby  Exercises - Supine Bridge  - 1 x daily - 4-5 x weekly - 3 sets - 8 reps - Supine Lower Trunk Rotation  - 1 x daily - 4-5 x weekly - 2 sets - 12 reps - Sidelying Hip Abduction  - 1 x daily - 4-5 x weekly - 2 sets - 12 reps - Standing Lumbar Extension with Counter  - 1 x daily - 7 x weekly - 2 sets - 12 reps - Standing Lumbar Extension at Wall - Forearms  - 1 x daily - 7 x weekly - 2 sets - 12 reps  ASSESSMENT:  CLINICAL IMPRESSION: Continuing PT POC with focus on improving lumbar AROM and proximal hip strength. Pt tolerating progression in compound LE exercises with sled pushes and lateral motions to progress hip strength based exercises. No pain reported with progressions. Also adding core stability exercises today with anti rotation presses. Will continue to progress as able in POC. Pt will benefit from skilled PT services to address these deficits and maximize return to PLOF.    OBJECTIVE IMPAIRMENTS: decreased mobility, decreased ROM, decreased strength, hypomobility, and pain.   ACTIVITY LIMITATIONS: bending and sitting  PARTICIPATION LIMITATIONS: community activity  PERSONAL FACTORS: Age, Past/current experiences, Time since  onset of injury/illness/exacerbation, and 3+ comorbidities: Anemia, HTN, OA, Glaucoma, Hyperthyroidism  are also affecting patient's functional outcome.   REHAB POTENTIAL: Good  CLINICAL DECISION MAKING: Evolving/moderate complexity  EVALUATION COMPLEXITY: Moderate   GOALS: Goals reviewed with patient? No  SHORT TERM GOALS: Target date: 10/20/22  Pt will be independent with HEP to improve LBP, mobility, and strength to return to PLOF. Baseline: 09/30/23 Goal status: INITIAL  LONG TERM GOALS: Target date: 11/11/23  Pt will improve FOTO to target score to demonstrate clinically significant improvement in functional mobility. Baseline: 09/30/23: deferred to next session; 10/10/23: 69/70 Goal status: INITIAL  2.  Pt will improve 30 sec STS to at least 13 reps for age matched norms to demonstrate improved LE strength and endurance for transfers and walking ADL's. Baseline: 09/30/23: 11 reps Goal status: INITIAL  3.  Pt will report worst pain as a 2/10 NPS with painful ADL's to demonstrate clinically significant improvement in pain. Baseline: 09/30/23: 9/10 NPS Goal status: INITIAL  PLAN:  PT FREQUENCY: 1-2x/week  PT DURATION: 6 weeks  PLANNED INTERVENTIONS: 97164- PT Re-evaluation, 97110-Therapeutic exercises, 97530- Therapeutic activity, 97112- Neuromuscular re-education, 97535- Self Care, 16109- Manual therapy, Patient/Family education, Joint mobilization, Joint manipulation, Spinal manipulation, Spinal mobilization, Cryotherapy, and Moist heat.  PLAN FOR NEXT SESSION: Lumbar mobility/hip strength. Add core stability exercise. Update HEP.   Delphia Grates. Fairly IV, PT, DPT Physical Therapist- [place]  [facility]  10/22/2023, 1:56 PM

## 2023-10-24 ENCOUNTER — Ambulatory Visit: Payer: Medicare Other

## 2023-10-24 DIAGNOSIS — M6281 Muscle weakness (generalized): Secondary | ICD-10-CM | POA: Diagnosis not present

## 2023-10-24 DIAGNOSIS — M5459 Other low back pain: Secondary | ICD-10-CM

## 2023-10-24 NOTE — Therapy (Signed)
OUTPATIENT PHYSICAL THERAPY THORACOLUMBAR TREATMENT   Patient Name: Kelly Santos MRN: [id number] DOB:12/16/1945, 78 y.o., female Today's Date: 10/24/2023  END OF SESSION:  PT End of Session - 10/24/23 1037     Visit Number 5    Number of Visits 13    Date for PT Re-Evaluation 11/11/23    PT Start Time 1031    PT Stop Time 1115    PT Time Calculation (min) 44 min    Activity Tolerance Patient tolerated treatment well    Behavior During Therapy [facility] for tasks assessed/performed             Past Medical History:  Diagnosis Date   Anemia    Aortic atherosclerosis (HCC) 11/07/2021   CAD (coronary artery disease)    no cardiologist, followed by PCP only   Cataract    bil cataracts removed   Diverticulosis    Generalized headaches    GERD (gastroesophageal reflux disease)    Glaucoma    Hemorrhoids    Hiatal hernia    On CT done Jan 31, 2011    Hx of colonic polyps    Hypertension    Hyperthyroidism    Osteoarthritis    Uterine polyp    Vitamin D deficiency    Past Surgical History:  Procedure Laterality Date   ABDOMINAL HYSTERECTOMY N/A 04/26/2013   Procedure: HYSTERECTOMY ABDOMINAL;  Surgeon: Jeani Hawking, MD;  Location: [facility];  Service: Gynecology;  Laterality: N/A;   COLONOSCOPY     DILATION AND CURETTAGE OF UTERUS  01/18/2013   with uterine polypectomy   SALPINGOOPHORECTOMY Bilateral 04/26/2013   Procedure: SALPINGO OOPHORECTOMY;  Surgeon: Jeani Hawking, MD;  Location: [facility];  Service: Gynecology;  Laterality: Bilateral;   UPPER GASTROINTESTINAL ENDOSCOPY     Patient Active Problem List   Diagnosis Date Noted   Aortic atherosclerosis (HCC) 11/07/2021   Left leg pain 06/14/2021   Goiter 01/25/2021   Hiatal hernia 06/09/2020   Dysphagia 06/09/2020   Chronic venous insufficiency 03/15/2020   New daily persistent headache 08/25/2019   Paresthesia 08/25/2019   Foot pain, right 03/24/2019   Epigastric pain 07/08/2018   Oral ulcer 05/22/2018   Neck  pain 05/08/2018   Pleural effusion 05/08/2018   RLQ abdominal pain 11/14/2017   Nausea 11/14/2017   Edema 02/28/2017   Upper respiratory infection 05/07/2016   Cough 05/07/2016   Eczema of face 01/22/2016   Fatigue 11/02/2015   Hyperglycemia 03/24/2015   Well adult exam 11/27/2012   Colon polyp, hyperplastic 11/27/2012   Low back pain 08/03/2012   Foot pain, left 04/14/2012   Callus of foot 04/14/2012   Leg pain, left 07/09/2011   Diastolic dysfunction 05/15/2011   Hypokalemia 05/15/2011   Thyrotoxicosis 03/26/2010   RECTAL BLEEDING 03/26/2010   Hemorrhoid 03/23/2010   Anemia due to chronic blood loss 03/23/2010   HERPES ZOSTER 11/09/2009   Febrile illness, acute 11/03/2009   Pain in limb 10/04/2009   Headache 10/04/2009   Tinea pedis 06/20/2009   ALLERGIC RHINITIS 12/15/2008   Chest pain, atypical 11/08/2008   CERVICAL RADICULOPATHY, RIGHT 03/25/2008   KNEE PAIN, ACUTE 01/18/2008   Vitamin D deficiency 11/21/2007   ABNORMAL CHEST XRAY 11/21/2007   Hypertension, uncontrolled 11/18/2007   GERD 11/18/2007   DIVERTICULOSIS, COLON 11/18/2007   Osteoarthritis 11/18/2007    PCP: Jacinta Shoe MD  REFERRING PROVIDER: Rodolph Bong, MD  REFERRING DIAG: M54.50,G89.29 (ICD-10-CM) - Chronic midline low back pain without sciatica  Rationale for Evaluation  and Treatment: Rehabilitation  THERAPY DIAG:  Other low back pain  Muscle weakness (generalized)  ONSET DATE: 08/17/23  SUBJECTIVE:                                                                                                                                                                                           SUBJECTIVE STATEMENT: Pt reports mild soreness after last session. Feeling a little under the weather today.   PERTINENT HISTORY:  Pt reports onset of LBP when she bent over the shower in November. Her pain subsided after seeing Dr. Denyse Amass for her LBP. Her symptoms continue to wax and wane having a lot  of back soreness near Christmas. Reports she has had chronic LBP for many years off and on. 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 Describes LBP as sore, achey. Occasionally sharp. Worst pain up to 9/10 NPS. Sometimes pain free. No LBP currently. Has pain across her lower back into the tops of her hips. Denies radicular symptoms. Pain with bending over, being on her knees to pray to return to standing. Walks 3-4x/week for 30-40 minutes which seems to help.   PAIN:  Are you having pain? Yes: NPRS scale: 0/10 Pain location: R/L lower back and upper glutes Pain description: dull, achey, occasionally sharp Aggravating factors: Bending over, transfers Relieving factors: ambulation  PRECAUTIONS: None  RED FLAGS: Bowel or bladder incontinence: No and Cauda equina syndrome: No   WEIGHT BEARING RESTRICTIONS: No  FALLS:  Has patient fallen in last 6 months? Yes. Number of falls 1  LIVING ENVIRONMENT: Lives with: lives alone  OCCUPATION: Retired  PLOF: Independent  PATIENT GOALS: To improve pain and stay active  NEXT MD VISIT: N/A  OBJECTIVE:  Note: Objective measures were completed at Evaluation unless otherwise noted.  DIAGNOSTIC FINDINGS:  CLINICAL DATA:  Pain for years.   EXAM: LUMBAR SPINE - 3 VIEW   COMPARISON:  04/07/2023.   FINDINGS: There is no evidence of lumbar spine fracture. Alignment is normal. Disc space narrowing and marginal osteophytes at each lumbar level consistent with mild degenerative disc disease.   IMPRESSION: Ne mild multilevel degenerative changes. No acute osseous abnormalities.     Electronically Signed   By: Layla Maw M.D.   On: 09/08/2023 21:16      PATIENT SURVEYS:  FOTO 69/70  COGNITION: Overall cognitive status: Within functional limits for tasks assessed     SENSATION: WFL  POSTURE: No Significant postural limitations  PALPATION: Significant bilat lumbar paraspinal tightness from sacrum  to approximately T12. Is TTP throughout R > L.   LUMBAR ROM:  AROM eval  Flexion 90%  Extension 25%*  Right lateral flexion 75%*  Left lateral flexion 75%*  Right rotation 50%*  Left rotation 50%   (Blank rows = not tested)  LOWER EXTREMITY ROM:     Active  Right eval Left eval  Hip flexion    Hip extension 15 12  Hip abduction    Hip adduction    Hip internal rotation    Hip external rotation    Knee flexion    Knee extension    Ankle dorsiflexion    Ankle plantarflexion    Ankle inversion    Ankle eversion     (Blank rows = not tested)  LOWER EXTREMITY MMT:    MMT Right eval Left eval  Hip flexion 4 4  Hip extension 3+ 4  Hip abduction 3+ 3+  Hip adduction    Hip internal rotation 4 4  Hip external rotation 5 5  Knee flexion 5 5  Knee extension 5 5  Ankle dorsiflexion 4 4  Ankle plantarflexion 5 5  Ankle inversion    Ankle eversion     (Blank rows = not tested)  LUMBAR SPECIAL TESTS:  FABER test: Negative FADDIR: Negative  Sacral thrust: Negative   JOINT MOBILITY: Hip AP: normal and painless bilat  Lumbar CPA's and UPA's (R > L) hypomobile with concordant pain sign L5-T12  FUNCTIONAL TESTS:  30 seconds chair stand test: 11 reps    6 MWT: Deferred to next session; 10/10/23: 1,550' GAIT: Distance walked: 10 meters Assistive device utilized: None Level of assistance: Complete Independence Comments: Normal  TREATMENT DATE: 10/24/23                                                                                                                         There.ex: Nu-Step L5 for 5 min BUE and LE use for lumbar warm up.   Seated physioball roll outs: x10 forwards and R/L deviations.   Suit case carries in SUE: 20# KB. 2x100'/UE for paraspinal and core strength.   Standing hip abduction at MATRIX: 2x12, 25#/LE. Mod multi modal cuing for form/technique and posture. Fair to good carryover with second set.  Seated on clear physioball: alternating  marches 2x12/LE. SBA.  PATIENT EDUCATION:  Education details: HEP (reps/sets/frequency), prognosis, POC Person educated: Patient Education method: Explanation, Demonstration, and Handouts Education comprehension: verbalized understanding  HOME EXERCISE PROGRAM: Access Code: [email or web address] URL: [email or web address] Date: 10/10/2023 Prepared by: Ronnie Derby  Exercises - Supine Bridge  - 1 x daily - 4-5 x weekly - 3 sets - 8 reps - Supine Lower Trunk Rotation  - 1 x daily - 4-5 x weekly - 2 sets - 12 reps - Sidelying Hip Abduction  - 1 x daily - 4-5 x weekly - 2 sets - 12 reps - Standing Lumbar Extension with Counter  - 1 x daily - 7 x weekly - 2 sets - 12 reps  Access Code: [email or web address] URL: [email or web address] Date: 09/30/2023 Prepared by: Stephannie Peters  Fairly  Exercises - Supine Bridge  - 1 x daily - 4-5 x weekly - 3 sets - 8 reps - Supine Lower Trunk Rotation  - 1 x daily - 4-5 x weekly - 2 sets - 12 reps - Sidelying Hip Abduction  - 1 x daily - 4-5 x weekly - 2 sets - 12 reps - Standing Lumbar Extension with Counter  - 1 x daily - 7 x weekly - 2 sets - 12 reps - Standing Lumbar Extension at Wall - Forearms  - 1 x daily - 7 x weekly - 2 sets - 12 reps  ASSESSMENT:  CLINICAL IMPRESSION: Continuing PT POC with focus on improving lumbar AROM and proximal hip strength. Overall volume today regressed due to pt reports of possibly feeling ill. Continuing however with single leg hip stability/strength and addition of core exercises today to pt tolerance. Pt continues to report reduction of LBP symptoms but remains with significant LLE hip abduction weakness likely contributing to symptoms. Pt will benefit from skilled PT services to address these deficits and maximize return to PLOF.    OBJECTIVE IMPAIRMENTS: decreased mobility, decreased ROM, decreased strength, hypomobility, and pain.   ACTIVITY LIMITATIONS: bending and sitting  PARTICIPATION LIMITATIONS:  community activity  PERSONAL FACTORS: Age, Past/current experiences, Time since onset of injury/illness/exacerbation, and 3+ comorbidities: Anemia, HTN, OA, Glaucoma, Hyperthyroidism  are also affecting patient's functional outcome.   REHAB POTENTIAL: Good  CLINICAL DECISION MAKING: Evolving/moderate complexity  EVALUATION COMPLEXITY: Moderate   GOALS: Goals reviewed with patient? No  SHORT TERM GOALS: Target date: 10/20/22  Pt will be independent with HEP to improve LBP, mobility, and strength to return to PLOF. Baseline: 09/30/23 Goal status: INITIAL  LONG TERM GOALS: Target date: 11/11/23  Pt will improve FOTO to target score to demonstrate clinically significant improvement in functional mobility. Baseline: 09/30/23: deferred to next session; 10/10/23: 69/70 Goal status: INITIAL  2.  Pt will improve 30 sec STS to at least 13 reps for age matched norms to demonstrate improved LE strength and endurance for transfers and walking ADL's. Baseline: 09/30/23: 11 reps Goal status: INITIAL  3.  Pt will report worst pain as a 2/10 NPS with painful ADL's to demonstrate clinically significant improvement in pain. Baseline: 09/30/23: 9/10 NPS Goal status: INITIAL  PLAN:  PT FREQUENCY: 1-2x/week  PT DURATION: 6 weeks  PLANNED INTERVENTIONS: 97164- PT Re-evaluation, 97110-Therapeutic exercises, 97530- Therapeutic activity, 97112- Neuromuscular re-education, 97535- Self Care, 16109- Manual therapy, Patient/Family education, Joint mobilization, Joint manipulation, Spinal manipulation, Spinal mobilization, Cryotherapy, and Moist heat.  PLAN FOR NEXT SESSION: Lumbar mobility/hip strength. Add core stability exercise. Update HEP.   Delphia Grates. Fairly IV, PT, DPT Physical Therapist- [place]  [facility]  10/24/2023, 1:07 PM

## 2023-10-27 ENCOUNTER — Ambulatory Visit: Payer: Self-pay | Admitting: Internal Medicine

## 2023-10-27 ENCOUNTER — Ambulatory Visit: Payer: Medicare Other

## 2023-10-27 NOTE — Telephone Encounter (Signed)
  Chief Complaint: cough Symptoms: cough, fever, hoarseness Frequency: 4 days Pertinent Negatives: Patient denies SOB, CP Disposition: [] ED /[] Urgent Care (no appt availability in office) / [x] Appointment(In office/virtual)/ []  [facility]/ [] Home Care/ [] Refused Recommended Disposition /[] [facility]/ []  Follow-up with PCP Additional Notes: Patient call returned and she states she has had a mildly productive cough with clear phlegm, hoarse voice, fever x 4 days. States she has been alternating tylenol and ibuprofen for fever control and it was 5F this morning. Per protocol, patient to be evaluated within 24 hours. Patient requests next available with PCP, does not want to see alternate provider. Per patient request, scheduled for 10/28/23 at 1600 with PCP. Care advice reviewed, patient verbalized understanding. Alerting PCP for review.   Reason for Disposition  Fever present > 3 days (72 hours)  Answer Assessment - Initial Assessment Questions 1. ONSET: "When did the cough begin?"      4 days ago 2. SEVERITY: "How bad is the cough today?"      Coughing spells, bad when coughing 3. SPUTUM: "Describe the color of your sputum" (none, dry cough; clear, white, yellow, green)     Clear 4. HEMOPTYSIS: "Are you coughing up any blood?" If so ask: "How much?" (flecks, streaks, tablespoons, etc.)     Denies 5. DIFFICULTY BREATHING: "Are you having difficulty breathing?" If Yes, ask: "How bad is it?" (e.g., mild, moderate, severe)    - MILD: No SOB at rest, mild SOB with walking, speaks normally in sentences, can lie down, no retractions, pulse < 100.    - MODERATE: SOB at rest, SOB with minimal exertion and prefers to sit, cannot lie down flat, speaks in phrases, mild retractions, audible wheezing, pulse 100-120.    - SEVERE: Very SOB at rest, speaks in single words, struggling to breathe, sitting hunched forward, retractions, pulse > 120      Denies SOB 6. FEVER: "Do you have  a fever?" If Yes, ask: "What is your temperature, how was it measured, and when did it start?"     Yes has had a fever but seems to have broken yesterday. 7. CARDIAC HISTORY: "Do you have any history of heart disease?" (e.g., heart attack, congestive heart failure)      Denies 8. LUNG HISTORY: "Do you have any history of lung disease?"  (e.g., pulmonary embolus, asthma, emphysema)     Denies 9. PE RISK FACTORS: "Do you have a history of blood clots?" (or: recent major surgery, recent prolonged travel, bedridden)     Denies 10. OTHER SYMPTOMS: "Do you have any other symptoms?" (e.g., runny nose, wheezing, chest pain)       Hoarse voice, fever, feels she had an occasional wheeze over the weekend.  Protocols used: Cough - Acute Productive-A-AH

## 2023-10-28 ENCOUNTER — Encounter: Payer: Self-pay | Admitting: Internal Medicine

## 2023-10-28 ENCOUNTER — Ambulatory Visit (INDEPENDENT_AMBULATORY_CARE_PROVIDER_SITE_OTHER): Payer: Medicare Other | Admitting: Internal Medicine

## 2023-10-28 VITALS — BP 120/82 | HR 61 | Temp 98.2°F | Ht 63.0 in | Wt 176.0 lb

## 2023-10-28 DIAGNOSIS — J069 Acute upper respiratory infection, unspecified: Secondary | ICD-10-CM

## 2023-10-28 DIAGNOSIS — I10 Essential (primary) hypertension: Secondary | ICD-10-CM

## 2023-10-28 DIAGNOSIS — R051 Acute cough: Secondary | ICD-10-CM

## 2023-10-28 LAB — POCT INFLUENZA A/B
Influenza A, POC: NEGATIVE
Influenza B, POC: NEGATIVE

## 2023-10-28 LAB — POC COVID19 BINAXNOW: SARS Coronavirus 2 Ag: NEGATIVE

## 2023-10-28 MED ORDER — HYDROCODONE BIT-HOMATROP MBR 5-1.5 MG/5ML PO SOLN: 5.0000 mL | Freq: Four times a day (QID) | ORAL | 0 refills | Status: AC | PRN

## 2023-10-28 MED ORDER — AZITHROMYCIN 250 MG PO TABS: ORAL_TABLET | ORAL | 0 refills | Status: DC

## 2023-10-28 NOTE — Assessment & Plan Note (Signed)
Acute Z pack Hycodan syr prn CXR if not better COVID and Flu tests

## 2023-10-28 NOTE — Assessment & Plan Note (Signed)
Much better on hydrochlorothiazide, Spironolactone

## 2023-10-28 NOTE — Progress Notes (Signed)
Subjective:  Patient ID: Kelly Santos, female    DOB: January 14, 1946  Age: 78 y.o. MRN: [id number]  CC: Cough (Worsening cough, fever, nasal drainage (clear sputum) since past Friday. Notes having fever up until Saturday morning (highest temp being 102.7). Currently treating with tylenol and ibuprofen)   HPI Hiawatha T Thede presents for cough (Worsening cough, fever, nasal drainage (clear sputum) since past Friday. Notes having fever up until Saturday morning (highest temp being 102.7). Currently treating with tylenol and ibuprofen) Sick x 4 days  Outpatient Medications Prior to Visit  Medication Sig Dispense Refill   amitriptyline (ELAVIL) 25 MG tablet TAKE 1 TABLET (25 MG TOTAL) BY MOUTH AT BEDTIME AS NEEDED FOR SLEEP. DUE FOR ROUTINE OV 90 tablet 2   brimonidine (ALPHAGAN) 0.2 % ophthalmic solution SMARTSIG:1 Drop(s) In Eye(s) Every 12 Hours     candesartan (ATACAND) 32 MG tablet TAKE 1 TABLET BY MOUTH DAILY. 90 tablet 3   cholecalciferol (VITAMIN D) 1000 UNITS tablet Take 1,000 Units by mouth every other day.      COMBIGAN 0.2-0.5 % ophthalmic solution INSTILL 1 DROP INTO BOTH EYES TWICE A DAY 5 mL 2   Ferrous Sulfate (IRON) 325 (65 FE) MG TABS Take 1 tablet by mouth 3 (three) times a week.      hydrochlorothiazide (MICROZIDE) 12.5 MG capsule Take 12.5 mg by mouth daily.     hydrocortisone (PROCTOCORT) 1 % CREA Apply 1 application topically 2 (two) times daily as needed (dry skin).      ketoconazole (NIZORAL) 2 % cream APPLY 1 APPLICATION TOPICALLY DAILY 45 g 1   pantoprazole (PROTONIX) 40 MG tablet TAKE 1 TABLET BY MOUTH EVERY DAY 90 tablet 3   spironolactone (ALDACTONE) 50 MG tablet TAKE 1 TABLET BY MOUTH DAILY 90 tablet 3   timolol (TIMOPTIC) 0.5 % ophthalmic solution 1 drop every morning.     triamcinolone ointment (KENALOG) 0.1 % Apply 1 Application topically 2 (two) times daily. 160 g 3   No facility-administered medications prior to visit.    ROS: Review of Systems   Constitutional:  Positive for fever. Negative for activity change, appetite change, chills, fatigue and unexpected weight change.  HENT:  Positive for congestion, postnasal drip, rhinorrhea and sinus pain. Negative for mouth sores and sinus pressure.   Eyes:  Negative for visual disturbance.  Respiratory:  Positive for cough and wheezing. Negative for choking, chest tightness, shortness of breath and stridor.   Gastrointestinal:  Negative for abdominal pain and nausea.  Genitourinary:  Negative for difficulty urinating, frequency and vaginal pain.  Musculoskeletal:  Positive for arthralgias. Negative for back pain and gait problem.  Skin:  Negative for pallor and rash.  Neurological:  Negative for dizziness, tremors, weakness, numbness and headaches.  Psychiatric/Behavioral:  Negative for confusion and sleep disturbance.     Objective:  BP 120/82   Pulse 61   Temp 98.2 F (36.8 C)   Ht 5\' 3"  (1.6 m)   Wt 176 lb (79.8 kg)   SpO2 98%   BMI 31.18 kg/m   BP Readings from Last 3 Encounters:  10/28/23 120/82  08/26/23 120/84  08/21/23 110/82    Wt Readings from Last 3 Encounters:  10/28/23 176 lb (79.8 kg)  08/26/23 176 lb (79.8 kg)  08/21/23 172 lb 3.2 oz (78.1 kg)    Physical Exam Constitutional:      General: She is not in acute distress.    Appearance: Normal appearance. She is well-developed.  HENT:  Head: Normocephalic.     Right Ear: External ear normal.     Left Ear: External ear normal.     Nose: Nose normal.  Eyes:     General:        Right eye: No discharge.        Left eye: No discharge.     Conjunctiva/sclera: Conjunctivae normal.     Pupils: Pupils are equal, round, and reactive to light.  Neck:     Thyroid: No thyromegaly.     Vascular: No JVD.     Trachea: No tracheal deviation.  Cardiovascular:     Rate and Rhythm: Normal rate and regular rhythm.     Heart sounds: Normal heart sounds.  Pulmonary:     Effort: No respiratory distress.      Breath sounds: No stridor. No wheezing.  Abdominal:     General: Bowel sounds are normal. There is no distension.     Palpations: Abdomen is soft. There is no mass.     Tenderness: There is no abdominal tenderness. There is no guarding or rebound.  Musculoskeletal:        General: No tenderness.     Cervical back: Normal range of motion and neck supple. No rigidity.  Lymphadenopathy:     Cervical: No cervical adenopathy.  Skin:    Findings: No erythema or rash.  Neurological:     Mental Status: She is oriented to person, place, and time.     Cranial Nerves: No cranial nerve deficit.     Motor: No abnormal muscle tone.     Coordination: Coordination normal.     Deep Tendon Reflexes: Reflexes normal.  Psychiatric:        Behavior: Behavior normal.        Thought Content: Thought content normal.        Judgment: Judgment normal.   Eryth throat  Lab Results  Component Value Date   WBC 4.3 04/07/2023   HGB 12.2 04/07/2023   HCT 36.5 04/07/2023   PLT 219.0 04/07/2023   GLUCOSE 105 (H) 04/07/2023   CHOL 163 04/07/2023   TRIG 162.0 (H) 04/07/2023   HDL 36.10 (L) 04/07/2023   LDLCALC 94 04/07/2023   ALT 20 04/07/2023   AST 17 04/07/2023   NA 135 04/07/2023   K 4.0 04/07/2023   CL 101 04/07/2023   CREATININE 1.00 04/07/2023   BUN 20 04/07/2023   CO2 30 04/07/2023   TSH 1.38 04/07/2023   INR 0.96 05/09/2011   HGBA1C 6.1 08/15/2022    VAS [place] LOWER EXTREMITY VENOUS (DVT) Result Date: 06/14/2021  Lower Venous Reflux Study Patient Name:  BRAYLEN DENUNZIO Releford  Date of Exam:   06/14/2021 Medical Rec #: [id number]      Accession #:    [id number] Date of Birth: [birth year]       Patient Gender: F Patient Age:   73 years Exam Location:  [facility] Procedure:      VAS [place] LOWER EXTREMITY VENOUS REFLUX Referring Phys: Kennyth Arnold BURNS --------------------------------------------------------------------------------  Indications: Left lower extremity pain and swelling. Study ordered as a  rule out DVT exam which was performed in conjunction with a left leg reflux exam.  Risk Factors: Significant varicosities of the left lower extremity. Performing Technologist: Dorthula Matas RVS, RCS  Examination Guidelines: A complete evaluation includes B-mode imaging, spectral Doppler, color Doppler, and power Doppler as needed of all accessible portions of each vessel. Bilateral testing is considered an integral part of a complete  examination. Limited examinations for reoccurring indications may be performed as noted. The reflux portion of the exam is performed with the patient in reverse Trendelenburg. Significant venous reflux is defined as >500 ms in the superficial venous system, and >1 second in the deep venous system.  +--------------+---------+------+-----------+------------+--------+ LEFT          Reflux NoRefluxReflux TimeDiameter cmsComments                         Yes                                  +--------------+---------+------+-----------+------------+--------+ CFV                     yes   >1 second                      +--------------+---------+------+-----------+------------+--------+ FV mid                  yes   >1 second                      +--------------+---------+------+-----------+------------+--------+ Popliteal     no                                             +--------------+---------+------+-----------+------------+--------+ GSV at SFJ              yes    >500 ms      1.25             +--------------+---------+------+-----------+------------+--------+ GSV prox thigh          yes    >500 ms      0.69             +--------------+---------+------+-----------+------------+--------+ GSV mid thigh           yes    >500 ms      0.72             +--------------+---------+------+-----------+------------+--------+ GSV dist thigh          yes    >500 ms      0.68              +--------------+---------+------+-----------+------------+--------+ GSV at knee             yes    >500 ms      0.65             +--------------+---------+------+-----------+------------+--------+ GSV prox calf           yes    >500 ms      0.70             +--------------+---------+------+-----------+------------+--------+ SSV Pop Fossa no                            0.19             +--------------+---------+------+-----------+------------+--------+  Findings attempted to be reported to Dr. Cheryll Cockayne office at 11:25 am. No answer. Results will be routed in epic.  Summary: Left: - No evidence of deep vein thrombosis from the common femoral through the popliteal veins. - No evidence of superficial venous thrombosis. - The deep venous system is not competent. - The  great saphenous vein is not competent. - The small saphenous vein is competent.  *See table(s) above for measurements and observations. Electronically signed by Heath Lark on 06/14/2021 at 1:34:39 PM.    Final     Assessment & Plan:   Problem List Items Addressed This Visit     Hypertension   Much better on hydrochlorothiazide, Spironolactone       Upper respiratory infection - Primary   Acute Z pack Hycodan syr prn CXR if not better COVID and Flu tests      Relevant Medications   azithromycin (ZITHROMAX Z-PAK) 250 MG tablet   Cough   Acute Z pack Hycodan syr prn CXR if not better COVID and Flu tests         Meds ordered this encounter  Medications   azithromycin (ZITHROMAX Z-PAK) 250 MG tablet    Sig: As directed    Dispense:  6 tablet    Refill:  0   HYDROcodone bit-homatropine (HYCODAN) 5-1.5 MG/5ML syrup    Sig: Take 5 mLs by mouth every 6 (six) hours as needed for up to 10 days for cough.    Dispense:  240 mL    Refill:  0      Follow-up: Return in about 3 months (around 01/26/2024) for a follow-up visit.  Sonda Primes, MD

## 2023-10-29 ENCOUNTER — Ambulatory Visit: Payer: Medicare Other

## 2023-11-03 ENCOUNTER — Ambulatory Visit: Payer: Medicare Other | Attending: Family Medicine

## 2023-11-03 DIAGNOSIS — M6281 Muscle weakness (generalized): Secondary | ICD-10-CM | POA: Diagnosis not present

## 2023-11-03 DIAGNOSIS — M5459 Other low back pain: Secondary | ICD-10-CM

## 2023-11-03 NOTE — Therapy (Signed)
OUTPATIENT PHYSICAL THERAPY THORACOLUMBAR TREATMENT   Patient Name: Kelly Santos MRN: [id number] DOB:[birth year], 78 y.o., female Today's Date: 11/03/2023  END OF SESSION:  PT End of Session - 11/03/23 1521     Visit Number 6    Number of Visits 13    Date for PT Re-Evaluation 11/11/23    PT Start Time 1516    PT Stop Time 1600    PT Time Calculation (min) 44 min    Activity Tolerance Patient tolerated treatment well    Behavior During Therapy [facility] for tasks assessed/performed             Past Medical History:  Diagnosis Date   Anemia    Aortic atherosclerosis (HCC) 11/07/2021   CAD (coronary artery disease)    no cardiologist, followed by PCP only   Cataract    bil cataracts removed   Diverticulosis    Generalized headaches    GERD (gastroesophageal reflux disease)    Glaucoma    Hemorrhoids    Hiatal hernia    On CT done Jan 31, 2011    Hx of colonic polyps    Hypertension    Hyperthyroidism    Osteoarthritis    Uterine polyp    Vitamin D deficiency    Past Surgical History:  Procedure Laterality Date   ABDOMINAL HYSTERECTOMY N/A 04/26/2013   Procedure: HYSTERECTOMY ABDOMINAL;  Surgeon: Jeani Hawking, MD;  Location: [facility];  Service: Gynecology;  Laterality: N/A;   COLONOSCOPY     DILATION AND CURETTAGE OF UTERUS  01/18/2013   with uterine polypectomy   SALPINGOOPHORECTOMY Bilateral 04/26/2013   Procedure: SALPINGO OOPHORECTOMY;  Surgeon: Jeani Hawking, MD;  Location: [facility];  Service: Gynecology;  Laterality: Bilateral;   UPPER GASTROINTESTINAL ENDOSCOPY     Patient Active Problem List   Diagnosis Date Noted   Aortic atherosclerosis (HCC) 11/07/2021   Left leg pain 06/14/2021   Goiter 01/25/2021   Hiatal hernia 06/09/2020   Dysphagia 06/09/2020   Chronic venous insufficiency 03/15/2020   New daily persistent headache 08/25/2019   Paresthesia 08/25/2019   Foot pain, right 03/24/2019   Epigastric pain 07/08/2018   Oral ulcer 05/22/2018   Neck  pain 05/08/2018   Pleural effusion 05/08/2018   RLQ abdominal pain 11/14/2017   Nausea 11/14/2017   Edema 02/28/2017   Upper respiratory infection 05/07/2016   Cough 05/07/2016   Eczema of face 01/22/2016   Fatigue 11/02/2015   Hyperglycemia 03/24/2015   Well adult exam 11/27/2012   Colon polyp, hyperplastic 11/27/2012   Low back pain 08/03/2012   Foot pain, left 04/14/2012   Callus of foot 04/14/2012   Leg pain, left 07/09/2011   Diastolic dysfunction 05/15/2011   Hypokalemia 05/15/2011   Thyrotoxicosis 03/26/2010   RECTAL BLEEDING 03/26/2010   Hemorrhoid 03/23/2010   Anemia due to chronic blood loss 03/23/2010   HERPES ZOSTER 11/09/2009   Febrile illness, acute 11/03/2009   Pain in limb 10/04/2009   Headache 10/04/2009   Tinea pedis 06/20/2009   ALLERGIC RHINITIS 12/15/2008   Chest pain, atypical 11/08/2008   CERVICAL RADICULOPATHY, RIGHT 03/25/2008   KNEE PAIN, ACUTE 01/18/2008   Vitamin D deficiency 11/21/2007   ABNORMAL CHEST XRAY 11/21/2007   Hypertension 11/18/2007   GERD 11/18/2007   DIVERTICULOSIS, COLON 11/18/2007   Osteoarthritis 11/18/2007    PCP: Jacinta Shoe MD  REFERRING PROVIDER: Rodolph Bong, MD  REFERRING DIAG: M54.50,G89.29 (ICD-10-CM) - Chronic midline low back pain without sciatica  Rationale for Evaluation and  Treatment: Rehabilitation  THERAPY DIAG:  Other low back pain  Muscle weakness (generalized)  ONSET DATE: 08/17/23  SUBJECTIVE:                                                                                                                                                                                           SUBJECTIVE STATEMENT: Pt reports mild soreness after last session. Feeling a little under the weather today.   PERTINENT HISTORY:  Pt reports onset of LBP when she bent over the shower in November. Her pain subsided after seeing Dr. Denyse Amass for her LBP. Her symptoms continue to wax and wane having a lot of back  soreness near Christmas. Reports she has had chronic LBP for many years off and on. 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 Describes LBP as sore, achey. Occasionally sharp. Worst pain up to 9/10 NPS. Sometimes pain free. No LBP currently. Has pain across her lower back into the tops of her hips. Denies radicular symptoms. Pain with bending over, being on her knees to pray to return to standing. Walks 3-4x/week for 30-40 minutes which seems to help.   PAIN:  Are you having pain? Yes: NPRS scale: 0/10 Pain location: R/L lower back and upper glutes Pain description: dull, achey, occasionally sharp Aggravating factors: Bending over, transfers Relieving factors: ambulation  PRECAUTIONS: None  RED FLAGS: Bowel or bladder incontinence: No and Cauda equina syndrome: No   WEIGHT BEARING RESTRICTIONS: No  FALLS:  Has patient fallen in last 6 months? Yes. Number of falls 1  LIVING ENVIRONMENT: Lives with: lives alone  OCCUPATION: Retired  PLOF: Independent  PATIENT GOALS: To improve pain and stay active  NEXT MD VISIT: N/A  OBJECTIVE:  Note: Objective measures were completed at Evaluation unless otherwise noted.  DIAGNOSTIC FINDINGS:  CLINICAL DATA:  Pain for years.   EXAM: LUMBAR SPINE - 3 VIEW   COMPARISON:  04/07/2023.   FINDINGS: There is no evidence of lumbar spine fracture. Alignment is normal. Disc space narrowing and marginal osteophytes at each lumbar level consistent with mild degenerative disc disease.   IMPRESSION: Ne mild multilevel degenerative changes. No acute osseous abnormalities.     Electronically Signed   By: Layla Maw M.D.   On: 09/08/2023 21:16      PATIENT SURVEYS:  FOTO 69/70  COGNITION: Overall cognitive status: Within functional limits for tasks assessed     SENSATION: WFL  POSTURE: No Significant postural limitations  PALPATION: Significant bilat lumbar paraspinal tightness from sacrum to  approximately T12. Is TTP throughout R > L.   LUMBAR ROM:  AROM eval  Flexion 90%  Extension 25%*  Right lateral flexion 75%*  Left lateral flexion 75%*  Right rotation 50%*  Left rotation 50%   (Blank rows = not tested)  LOWER EXTREMITY ROM:     Active  Right eval Left eval  Hip flexion    Hip extension 15 12  Hip abduction    Hip adduction    Hip internal rotation    Hip external rotation    Knee flexion    Knee extension    Ankle dorsiflexion    Ankle plantarflexion    Ankle inversion    Ankle eversion     (Blank rows = not tested)  LOWER EXTREMITY MMT:    MMT Right eval Left eval  Hip flexion 4 4  Hip extension 3+ 4  Hip abduction 3+ 3+  Hip adduction    Hip internal rotation 4 4  Hip external rotation 5 5  Knee flexion 5 5  Knee extension 5 5  Ankle dorsiflexion 4 4  Ankle plantarflexion 5 5  Ankle inversion    Ankle eversion     (Blank rows = not tested)  LUMBAR SPECIAL TESTS:  FABER test: Negative FADDIR: Negative  Sacral thrust: Negative   JOINT MOBILITY: Hip AP: normal and painless bilat  Lumbar CPA's and UPA's (R > L) hypomobile with concordant pain sign L5-T12  FUNCTIONAL TESTS:  30 seconds chair stand test: 11 reps    6 MWT: Deferred to next session; 10/10/23: 1,550' GAIT: Distance walked: 10 meters Assistive device utilized: None Level of assistance: Complete Independence Comments: Normal  TREATMENT DATE: 11/03/23                                                                                                                         There.ex: Nu-Step L5 for 5 min BUE and LE use for lumbar warm up. Not billed.   There.Act:  Side step ups to 6" step: 2x15/Side, light SUE support for balance.   Lateral steps with blue TB: 4x12'/direction.   Suit case carries in SUE: Grey TB tied around 20# KB for added perturbation. 2x100'/UE for paraspinal and core strength.   D2 flexion patterns over head with RTB: 2x12/side   PATIENT  EDUCATION:  Education details: HEP (reps/sets/frequency), prognosis, POC Person educated: Patient Education method: Explanation, Demonstration, and Handouts Education comprehension: verbalized understanding  HOME EXERCISE PROGRAM: Access Code: [email or web address] URL: [email or web address] Date: 10/10/2023 Prepared by: Ronnie Derby  Exercises - Supine Bridge  - 1 x daily - 4-5 x weekly - 3 sets - 8 reps - Supine Lower Trunk Rotation  - 1 x daily - 4-5 x weekly - 2 sets - 12 reps - Sidelying Hip Abduction  - 1 x daily - 4-5 x weekly - 2 sets - 12 reps - Standing Lumbar Extension with Counter  - 1 x daily - 7 x weekly - 2 sets - 12 reps  Access Code: [email or web address] URL: [email or web address] Date: 09/30/2023 Prepared by: Stephannie Peters  Fairly  Exercises - Supine Bridge  - 1 x daily - 4-5 x weekly - 3 sets - 8 reps - Supine Lower Trunk Rotation  - 1 x daily - 4-5 x weekly - 2 sets - 12 reps - Sidelying Hip Abduction  - 1 x daily - 4-5 x weekly - 2 sets - 12 reps - Standing Lumbar Extension with Counter  - 1 x daily - 7 x weekly - 2 sets - 12 reps - Standing Lumbar Extension at Wall - Forearms  - 1 x daily - 7 x weekly - 2 sets - 12 reps  ASSESSMENT:  CLINICAL IMPRESSION: Continuing PT POC with focus on progressing core and hip strength. Pt continues to progress with RLE loading and dynamic core stability exercises without exacerbation of her pain. PT to update HEP next session if pt tolerates this session well. Pt continues to report reduction of LBP symptoms but remains with significant LLE hip abduction weakness likely contributing to symptoms. Pt will benefit from skilled PT services to address these deficits and maximize return to PLOF.   OBJECTIVE IMPAIRMENTS: decreased mobility, decreased ROM, decreased strength, hypomobility, and pain.   ACTIVITY LIMITATIONS: bending and sitting  PARTICIPATION LIMITATIONS: community activity  PERSONAL FACTORS: Age, Past/current  experiences, Time since onset of injury/illness/exacerbation, and 3+ comorbidities: Anemia, HTN, OA, Glaucoma, Hyperthyroidism  are also affecting patient's functional outcome.   REHAB POTENTIAL: Good  CLINICAL DECISION MAKING: Evolving/moderate complexity  EVALUATION COMPLEXITY: Moderate   GOALS: Goals reviewed with patient? No  SHORT TERM GOALS: Target date: 10/20/22  Pt will be independent with HEP to improve LBP, mobility, and strength to return to PLOF. Baseline: 09/30/23 Goal status: INITIAL  LONG TERM GOALS: Target date: 11/11/23  Pt will improve FOTO to target score to demonstrate clinically significant improvement in functional mobility. Baseline: 09/30/23: deferred to next session; 10/10/23: 69/70 Goal status: INITIAL  2.  Pt will improve 30 sec STS to at least 13 reps for age matched norms to demonstrate improved LE strength and endurance for transfers and walking ADL's. Baseline: 09/30/23: 11 reps Goal status: INITIAL  3.  Pt will report worst pain as a 2/10 NPS with painful ADL's to demonstrate clinically significant improvement in pain. Baseline: 09/30/23: 9/10 NPS Goal status: INITIAL  PLAN:  PT FREQUENCY: 1-2x/week  PT DURATION: 6 weeks  PLANNED INTERVENTIONS: [id number]- PT Re-evaluation, 97110-Therapeutic exercises, 97530- Therapeutic activity, 97112- Neuromuscular re-education, 97535- Self Care, 95284- Manual therapy, Patient/Family education, Joint mobilization, Joint manipulation, Spinal manipulation, Spinal mobilization, Cryotherapy, and Moist heat.  PLAN FOR NEXT SESSION: Lumbar mobility/hip strength. Add core stability exercise. Update HEP.   Delphia Grates. Fairly IV, PT, DPT Physical Therapist-   [facility]  11/03/2023, 4:03 PM

## 2023-11-05 ENCOUNTER — Ambulatory Visit: Payer: Medicare Other

## 2023-11-05 DIAGNOSIS — M5459 Other low back pain: Secondary | ICD-10-CM

## 2023-11-05 DIAGNOSIS — M6281 Muscle weakness (generalized): Secondary | ICD-10-CM

## 2023-11-05 NOTE — Therapy (Signed)
 OUTPATIENT PHYSICAL THERAPY THORACOLUMBAR TREATMENT   Patient Name: Kelly Santos MRN: [id number] DOB:1946-07-07, 78 y.o., female Today's Date: 11/05/2023  END OF SESSION:  PT End of Session - 11/05/23 1115     Visit Number 7    Number of Visits 13    Date for PT Re-Evaluation 11/11/23    PT Start Time 1115    PT Stop Time 1200    PT Time Calculation (min) 45 min    Activity Tolerance Patient tolerated treatment well    Behavior During Therapy [facility] for tasks assessed/performed             Past Medical History:  Diagnosis Date   Anemia    Aortic atherosclerosis (HCC) 11/07/2021   CAD (coronary artery disease)    no cardiologist, followed by PCP only   Cataract    bil cataracts removed   Diverticulosis    Generalized headaches    GERD (gastroesophageal reflux disease)    Glaucoma    Hemorrhoids    Hiatal hernia    On CT done Jan 31, 2011    Hx of colonic polyps    Hypertension    Hyperthyroidism    Osteoarthritis    Uterine polyp    Vitamin D  deficiency    Past Surgical History:  Procedure Laterality Date   ABDOMINAL HYSTERECTOMY N/A 04/26/2013   Procedure: HYSTERECTOMY ABDOMINAL;  Surgeon: Rosaline LITTIE Cobble, MD;  Location: [facility];  Service: Gynecology;  Laterality: N/A;   COLONOSCOPY     DILATION AND CURETTAGE OF UTERUS  01/18/2013   with uterine polypectomy   SALPINGOOPHORECTOMY Bilateral 04/26/2013   Procedure: SALPINGO OOPHORECTOMY;  Surgeon: Rosaline LITTIE Cobble, MD;  Location: [facility];  Service: Gynecology;  Laterality: Bilateral;   UPPER GASTROINTESTINAL ENDOSCOPY     Patient Active Problem List   Diagnosis Date Noted   Aortic atherosclerosis (HCC) 11/07/2021   Left leg pain 06/14/2021   Goiter 01/25/2021   Hiatal hernia 06/09/2020   Dysphagia 06/09/2020   Chronic venous insufficiency 03/15/2020   New daily persistent headache 08/25/2019   Paresthesia 08/25/2019   Foot pain, right 03/24/2019   Epigastric pain 07/08/2018   Oral ulcer 05/22/2018   Neck  pain 05/08/2018   Pleural effusion 05/08/2018   RLQ abdominal pain 11/14/2017   Nausea 11/14/2017   Edema 02/28/2017   Upper respiratory infection 05/07/2016   Cough 05/07/2016   Eczema of face 01/22/2016   Fatigue 11/02/2015   Hyperglycemia 03/24/2015   Well adult exam 11/27/2012   Colon polyp, hyperplastic 11/27/2012   Low back pain 08/03/2012   Foot pain, left 04/14/2012   Callus of foot 04/14/2012   Leg pain, left 07/09/2011   Diastolic dysfunction 05/15/2011   Hypokalemia 05/15/2011   Thyrotoxicosis 03/26/2010   RECTAL BLEEDING 03/26/2010   Hemorrhoid 03/23/2010   Anemia due to chronic blood loss 03/23/2010   HERPES ZOSTER 11/09/2009   Febrile illness, acute 11/03/2009   Pain in limb 10/04/2009   Headache 10/04/2009   Tinea pedis 06/20/2009   ALLERGIC RHINITIS 12/15/2008   Chest pain, atypical 11/08/2008   CERVICAL RADICULOPATHY, RIGHT 03/25/2008   KNEE PAIN, ACUTE 01/18/2008   Vitamin D  deficiency 11/21/2007   ABNORMAL CHEST XRAY 11/21/2007   Hypertension 11/18/2007   GERD 11/18/2007   DIVERTICULOSIS, COLON 11/18/2007   Osteoarthritis 11/18/2007    PCP: Garald Scrape MD  REFERRING PROVIDER: Joane Artist RAMAN, MD  REFERRING DIAG: M54.50,G89.29 (ICD-10-CM) - Chronic midline low back pain without sciatica  Rationale for Evaluation and  Treatment: Rehabilitation  THERAPY DIAG:  Other low back pain  Muscle weakness (generalized)  ONSET DATE: 08/17/23  SUBJECTIVE:                                                                                                                                                                                           SUBJECTIVE STATEMENT: Pt reports mild soreness after last session. Feeling a little under the weather today.   PERTINENT HISTORY:  Pt reports onset of LBP when she bent over the shower in November. Her pain subsided after seeing Dr. Joane for her LBP. Her symptoms continue to wax and wane having a lot of back  soreness near Christmas. Reports she has had chronic LBP for many years off and on. 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 Describes LBP as sore, achey. Occasionally sharp. Worst pain up to 9/10 NPS. Sometimes pain free. No LBP currently. Has pain across her lower back into the tops of her hips. Denies radicular symptoms. Pain with bending over, being on her knees to pray to return to standing. Walks 3-4x/week for 30-40 minutes which seems to help.   PAIN:  Are you having pain? Yes: NPRS scale: 0/10 Pain location: R/L lower back and upper glutes Pain description: dull, achey, occasionally sharp Aggravating factors: Bending over, transfers Relieving factors: ambulation  PRECAUTIONS: None  RED FLAGS: Bowel or bladder incontinence: No and Cauda equina syndrome: No   WEIGHT BEARING RESTRICTIONS: No  FALLS:  Has patient fallen in last 6 months? Yes. Number of falls 1  LIVING ENVIRONMENT: Lives with: lives alone  OCCUPATION: Retired  PLOF: Independent  PATIENT GOALS: To improve pain and stay active  NEXT MD VISIT: N/A  OBJECTIVE:  Note: Objective measures were completed at Evaluation unless otherwise noted.  DIAGNOSTIC FINDINGS:  CLINICAL DATA:  Pain for years.   EXAM: LUMBAR SPINE - 3 VIEW   COMPARISON:  04/07/2023.   FINDINGS: There is no evidence of lumbar spine fracture. Alignment is normal. Disc space narrowing and marginal osteophytes at each lumbar level consistent with mild degenerative disc disease.   IMPRESSION: Ne mild multilevel degenerative changes. No acute osseous abnormalities.     Electronically Signed   By: Fonda Field M.D.   On: 09/08/2023 21:16      PATIENT SURVEYS:  FOTO 69/70  COGNITION: Overall cognitive status: Within functional limits for tasks assessed     SENSATION: WFL  POSTURE: No Significant postural limitations  PALPATION: Significant bilat lumbar paraspinal tightness from sacrum to  approximately T12. Is TTP throughout R > L.   LUMBAR ROM:  AROM eval  Flexion 90%  Extension 25%*  Right lateral flexion 75%*  Left lateral flexion 75%*  Right rotation 50%*  Left rotation 50%   (Blank rows = not tested)  LOWER EXTREMITY ROM:     Active  Right eval Left eval  Hip flexion    Hip extension 15 12  Hip abduction    Hip adduction    Hip internal rotation    Hip external rotation    Knee flexion    Knee extension    Ankle dorsiflexion    Ankle plantarflexion    Ankle inversion    Ankle eversion     (Blank rows = not tested)  LOWER EXTREMITY MMT:    MMT Right eval Left eval  Hip flexion 4 4  Hip extension 3+ 4  Hip abduction 3+ 3+  Hip adduction    Hip internal rotation 4 4  Hip external rotation 5 5  Knee flexion 5 5  Knee extension 5 5  Ankle dorsiflexion 4 4  Ankle plantarflexion 5 5  Ankle inversion    Ankle eversion     (Blank rows = not tested)  LUMBAR SPECIAL TESTS:  FABER test: Negative FADDIR: Negative  Sacral thrust: Negative   JOINT MOBILITY: Hip AP: normal and painless bilat  Lumbar CPA's and UPA's (R > L) hypomobile with concordant pain sign L5-T12  FUNCTIONAL TESTS:  30 seconds chair stand test: 11 reps    6 MWT: Deferred to next session; 10/10/23: 1,550' GAIT: Distance walked: 10 meters Assistive device utilized: None Level of assistance: Complete Independence Comments: Normal  TREATMENT DATE: 11/05/23                                                                                                                         There.ex: Nu-Step L5 for 5 min BUE and LE use for lumbar warm up. Not billed.  Hook lying with moist heat to lowe back for improving tissue extensibility and pain modulation   LTR's: 2x12/side   Posterior pelvic tilts: 2x12  Alternating marches: 2x12  Bridges: 3x8   Seated physioball roll outs: x12/direction forwards/lateral deviations R and L   Standing, blue TB scap retractions for thoracic  extension and periscapular strengthening: 3x12  PATIENT EDUCATION:  Education details: HEP (reps/sets/frequency), prognosis, POC Person educated: Patient Education method: Explanation, Demonstration, and Handouts Education comprehension: verbalized understanding  HOME EXERCISE PROGRAM: Access Code: [email or web address] URL: [email or web address] Date: 10/10/2023 Prepared by: Dorina Kingfisher  Exercises - Supine Bridge  - 1 x daily - 4-5 x weekly - 3 sets - 8 reps - Supine Lower Trunk Rotation  - 1 x daily - 4-5 x weekly - 2 sets - 12 reps - Sidelying Hip Abduction  - 1 x daily - 4-5 x weekly - 2 sets - 12 reps - Standing Lumbar Extension with Counter  - 1 x daily - 7 x weekly - 2 sets - 12 reps  Access Code: [email or web address] URL: [email or web address] Date: 09/30/2023 Prepared  by: Dorina Kingfisher  Exercises - Supine Bridge  - 1 x daily - 4-5 x weekly - 3 sets - 8 reps - Supine Lower Trunk Rotation  - 1 x daily - 4-5 x weekly - 2 sets - 12 reps - Sidelying Hip Abduction  - 1 x daily - 4-5 x weekly - 2 sets - 12 reps - Standing Lumbar Extension with Counter  - 1 x daily - 7 x weekly - 2 sets - 12 reps - Standing Lumbar Extension at Wall - Forearms  - 1 x daily - 7 x weekly - 2 sets - 12 reps  ASSESSMENT:  CLINICAL IMPRESSION: Continuing PT POC with focus on progressing core and hip strength. Slight regression performed today due to flare up in symptoms yesterday with moderate LBP. Use of moist heat modality for tissue extensibility in setting of lumbar muscle spasms. Focus today on lumbar mobility, core activation and gentle hip and periscapular strengthening to improve LBP. Pt reports ease of symptoms post session. Education provided on activity modification as needed for symptom management. PT will require RECERT or D/C next session.   OBJECTIVE IMPAIRMENTS: decreased mobility, decreased ROM, decreased strength, hypomobility, and pain.   ACTIVITY LIMITATIONS: bending and  sitting  PARTICIPATION LIMITATIONS: community activity  PERSONAL FACTORS: Age, Past/current experiences, Time since onset of injury/illness/exacerbation, and 3+ comorbidities: Anemia, HTN, OA, Glaucoma, Hyperthyroidism  are also affecting patient's functional outcome.   REHAB POTENTIAL: Good  CLINICAL DECISION MAKING: Evolving/moderate complexity  EVALUATION COMPLEXITY: Moderate   GOALS: Goals reviewed with patient? No  SHORT TERM GOALS: Target date: 10/20/22  Pt will be independent with HEP to improve LBP, mobility, and strength to return to PLOF. Baseline: 09/30/23 Goal status: INITIAL  LONG TERM GOALS: Target date: 11/11/23  Pt will improve FOTO to target score to demonstrate clinically significant improvement in functional mobility. Baseline: 09/30/23: deferred to next session; 10/10/23: 69/70 Goal status: INITIAL  2.  Pt will improve 30 sec STS to at least 13 reps for age matched norms to demonstrate improved LE strength and endurance for transfers and walking ADL's. Baseline: 09/30/23: 11 reps Goal status: INITIAL  3.  Pt will report worst pain as a 2/10 NPS with painful ADL's to demonstrate clinically significant improvement in pain. Baseline: 09/30/23: 9/10 NPS Goal status: INITIAL  PLAN:  PT FREQUENCY: 1-2x/week  PT DURATION: 6 weeks  PLANNED INTERVENTIONS: 97164- PT Re-evaluation, 97110-Therapeutic exercises, 97530- Therapeutic activity, 97112- Neuromuscular re-education, 97535- Self Care, [id number]- Manual therapy, Patient/Family education, Joint mobilization, Joint manipulation, Spinal manipulation, Spinal mobilization, Cryotherapy, and Moist heat.  PLAN FOR NEXT SESSION: RECERT or D/C  Dorina HERO. Fairly IV, PT, DPT Physical Therapist- [place]  [facility]  11/05/2023, 12:03 PM

## 2023-11-10 ENCOUNTER — Ambulatory Visit: Payer: Medicare Other

## 2023-11-10 DIAGNOSIS — M6281 Muscle weakness (generalized): Secondary | ICD-10-CM

## 2023-11-10 DIAGNOSIS — M5459 Other low back pain: Secondary | ICD-10-CM

## 2023-11-10 NOTE — Therapy (Signed)
 OUTPATIENT PHYSICAL THERAPY THORACOLUMBAR TREATMENT/DISCHARGE SUMMARY   Patient Name: Kelly Santos MRN: [id number] DOB:08-22-1946, 78 y.o., female Today's Date: 11/10/2023  END OF SESSION:  PT End of Session - 11/10/23 1118     Visit Number 8    Number of Visits 13    Date for PT Re-Evaluation 11/11/23    PT Start Time 1118    PT Stop Time 1147    PT Time Calculation (min) 29 min    Activity Tolerance Patient tolerated treatment well    Behavior During Therapy [facility] for tasks assessed/performed             Past Medical History:  Diagnosis Date   Anemia    Aortic atherosclerosis (HCC) 11/07/2021   CAD (coronary artery disease)    no cardiologist, followed by PCP only   Cataract    bil cataracts removed   Diverticulosis    Generalized headaches    GERD (gastroesophageal reflux disease)    Glaucoma    Hemorrhoids    Hiatal hernia    On CT done Jan 31, 2011    Hx of colonic polyps    Hypertension    Hyperthyroidism    Osteoarthritis    Uterine polyp    Vitamin D  deficiency    Past Surgical History:  Procedure Laterality Date   ABDOMINAL HYSTERECTOMY N/A 04/26/2013   Procedure: HYSTERECTOMY ABDOMINAL;  Surgeon: Martine Sleek, MD;  Location: [facility];  Service: Gynecology;  Laterality: N/A;   COLONOSCOPY     DILATION AND CURETTAGE OF UTERUS  01/18/2013   with uterine polypectomy   SALPINGOOPHORECTOMY Bilateral 04/26/2013   Procedure: SALPINGO OOPHORECTOMY;  Surgeon: Martine Sleek, MD;  Location: [facility];  Service: Gynecology;  Laterality: Bilateral;   UPPER GASTROINTESTINAL ENDOSCOPY     Patient Active Problem List   Diagnosis Date Noted   Aortic atherosclerosis (HCC) 11/07/2021   Left leg pain 06/14/2021   Goiter 01/25/2021   Hiatal hernia 06/09/2020   Dysphagia 06/09/2020   Chronic venous insufficiency 03/15/2020   New daily persistent headache 08/25/2019   Paresthesia 08/25/2019   Foot pain, right 03/24/2019   Epigastric pain 07/08/2018   Oral ulcer  05/22/2018   Neck pain 05/08/2018   Pleural effusion 05/08/2018   RLQ abdominal pain 11/14/2017   Nausea 11/14/2017   Edema 02/28/2017   Upper respiratory infection 05/07/2016   Cough 05/07/2016   Eczema of face 01/22/2016   Fatigue 11/02/2015   Hyperglycemia 03/24/2015   Well adult exam 11/27/2012   Colon polyp, hyperplastic 11/27/2012   Low back pain 08/03/2012   Foot pain, left 04/14/2012   Callus of foot 04/14/2012   Leg pain, left 07/09/2011   Diastolic dysfunction 05/15/2011   Hypokalemia 05/15/2011   Thyrotoxicosis 03/26/2010   RECTAL BLEEDING 03/26/2010   Hemorrhoid 03/23/2010   Anemia due to chronic blood loss 03/23/2010   HERPES ZOSTER 11/09/2009   Febrile illness, acute 11/03/2009   Pain in limb 10/04/2009   Headache 10/04/2009   Tinea pedis 06/20/2009   ALLERGIC RHINITIS 12/15/2008   Chest pain, atypical 11/08/2008   CERVICAL RADICULOPATHY, RIGHT 03/25/2008   KNEE PAIN, ACUTE 01/18/2008   Vitamin D  deficiency 11/21/2007   ABNORMAL CHEST XRAY 11/21/2007   Hypertension 11/18/2007   GERD 11/18/2007   DIVERTICULOSIS, COLON 11/18/2007   Osteoarthritis 11/18/2007    PCP: Adelaide Holy MD  REFERRING PROVIDER: Syliva Even, MD  REFERRING DIAG: M54.50,G89.29 (ICD-10-CM) - Chronic midline low back pain without sciatica  Rationale for Evaluation  and Treatment: Rehabilitation  THERAPY DIAG:  Other low back pain  Muscle weakness (generalized)  ONSET DATE: 08/17/23  SUBJECTIVE:                                                                                                                                                                                           SUBJECTIVE STATEMENT: Pt reports her LBP has subsided back to it's improved levels. Agreeable she is ready for discharge and can manage with HEP based program.   PERTINENT HISTORY:  Pt reports onset of LBP when she bent over the shower in November. Her pain subsided after seeing Dr. Alease Hunter for her  LBP. Her symptoms continue to wax and wane having a lot of back soreness near Christmas. Reports she has had chronic LBP for many years off and on. 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 Describes LBP as sore, achey. Occasionally sharp. Worst pain up to 9/10 NPS. Sometimes pain free. No LBP currently. Has pain across her lower back into the tops of her hips. Denies radicular symptoms. Pain with bending over, being on her knees to pray to return to standing. Walks 3-4x/week for 30-40 minutes which seems to help.   PAIN:  Are you having pain? Yes: NPRS scale: 0/10 Pain location: R/L lower back and upper glutes Pain description: dull, achey, occasionally sharp Aggravating factors: Bending over, transfers Relieving factors: ambulation  PRECAUTIONS: None  RED FLAGS: Bowel or bladder incontinence: No and Cauda equina syndrome: No   WEIGHT BEARING RESTRICTIONS: No  FALLS:  Has patient fallen in last 6 months? Yes. Number of falls 1  LIVING ENVIRONMENT: Lives with: lives alone  OCCUPATION: Retired  PLOF: Independent  PATIENT GOALS: To improve pain and stay active  NEXT MD VISIT: N/A  OBJECTIVE:  Note: Objective measures were completed at Evaluation unless otherwise noted.  DIAGNOSTIC FINDINGS:  CLINICAL DATA:  Pain for years.   EXAM: LUMBAR SPINE - 3 VIEW   COMPARISON:  04/07/2023.   FINDINGS: There is no evidence of lumbar spine fracture. Alignment is normal. Disc space narrowing and marginal osteophytes at each lumbar level consistent with mild degenerative disc disease.   IMPRESSION: Ne mild multilevel degenerative changes. No acute osseous abnormalities.     Electronically Signed   By: Sydell Eva M.D.   On: 09/08/2023 21:16      PATIENT SURVEYS:  FOTO 69/70  COGNITION: Overall cognitive status: Within functional limits for tasks assessed     SENSATION: WFL  POSTURE: No Significant postural  limitations  PALPATION: Significant bilat lumbar paraspinal tightness from sacrum to approximately T12. Is  TTP throughout R > L.   LUMBAR ROM:   AROM eval  Flexion 90%  Extension 25%*  Right lateral flexion 75%*  Left lateral flexion 75%*  Right rotation 50%*  Left rotation 50%   (Blank rows = not tested)  LOWER EXTREMITY ROM:     Active  Right eval Left eval  Hip flexion    Hip extension 15 12  Hip abduction    Hip adduction    Hip internal rotation    Hip external rotation    Knee flexion    Knee extension    Ankle dorsiflexion    Ankle plantarflexion    Ankle inversion    Ankle eversion     (Blank rows = not tested)  LOWER EXTREMITY MMT:    MMT Right eval Left eval  Hip flexion 4 4  Hip extension 3+ 4  Hip abduction 3+ 3+  Hip adduction    Hip internal rotation 4 4  Hip external rotation 5 5  Knee flexion 5 5  Knee extension 5 5  Ankle dorsiflexion 4 4  Ankle plantarflexion 5 5  Ankle inversion    Ankle eversion     (Blank rows = not tested)  LUMBAR SPECIAL TESTS:  FABER test: Negative FADDIR: Negative  Sacral thrust: Negative   JOINT MOBILITY: Hip AP: normal and painless bilat  Lumbar CPA's and UPA's (R > L) hypomobile with concordant pain sign L5-T12  FUNCTIONAL TESTS:  30 seconds chair stand test: 11 reps    6 MWT: Deferred to next session; 10/10/23: 1,550' GAIT: Distance walked: 10 meters Assistive device utilized: None Level of assistance: Complete Independence Comments: Normal  TREATMENT DATE: 11/10/23                                                                                                                         There.ex:  Reviewed goals. See clinical impression and goals section for details.   Reviewed and educated on updated HEP. Discussed reps/sets/frequency.   Performed 2x8 squats with BUE support. Min VC's for form/technique with good carryover on second sets.   Performed x8 mini lunges/LE with SUE support. Min  multimodal cuing for first 2 reps/LE but good carryover after.   PATIENT EDUCATION:  Education details: HEP (reps/sets/frequency), prognosis, POC Person educated: Patient Education method: Explanation, Demonstration, and Handouts Education comprehension: verbalized understanding  HOME EXERCISE PROGRAM: Access Code: [email or web address] URL: [email or web address] Date: 11/10/2023 Prepared by: Lyda Samples  Exercises - Supine Lower Trunk Rotation  - 1 x daily - 4-5 x weekly - 2 sets - 12 reps - Sidelying Hip Abduction  - 1 x daily - 4-5 x weekly - 2 sets - 12 reps - Standing Lumbar Extension with Counter  - 1 x daily - 7 x weekly - 2 sets - 12 reps - Seated Flexion Stretch with Swiss Ball  - 1 x daily - 7 x weekly - 2 sets - 12 reps - Mini Squat with Counter Support  -  1 x daily - 3-4 x weekly - 3 sets - 8 reps - Lunge with Counter Support  - 1 x daily - 3-4 x weekly - 3 sets - 8 reps   Access Code: [email or web address] URL: [email or web address] Date: 10/10/2023 Prepared by: Lyda Samples  Exercises - Supine Bridge  - 1 x daily - 4-5 x weekly - 3 sets - 8 reps - Supine Lower Trunk Rotation  - 1 x daily - 4-5 x weekly - 2 sets - 12 reps - Sidelying Hip Abduction  - 1 x daily - 4-5 x weekly - 2 sets - 12 reps - Standing Lumbar Extension with Counter  - 1 x daily - 7 x weekly - 2 sets - 12 reps  Access Code: [email or web address] URL: [email or web address] Date: 09/30/2023 Prepared by: Lyda Samples  Exercises - Supine Bridge  - 1 x daily - 4-5 x weekly - 3 sets - 8 reps - Supine Lower Trunk Rotation  - 1 x daily - 4-5 x weekly - 2 sets - 12 reps - Sidelying Hip Abduction  - 1 x daily - 4-5 x weekly - 2 sets - 12 reps - Standing Lumbar Extension with Counter  - 1 x daily - 7 x weekly - 2 sets - 12 reps - Standing Lumbar Extension at Wall - Forearms  - 1 x daily - 7 x weekly - 2 sets - 12 reps  ASSESSMENT:  CLINICAL IMPRESSION: Pt at end of POC warranting discharge.  Pt has met most of her goals with excellent FOTO score, scoring well beyond age matched norms for 30 sec STS test, and has vastly improved pain levels indicative of returned to baseline functional capacity. Pt educated on updated HEP and HEP based program for home management. Pt understanding of education with no further questions. PT formally discharged from PT.     OBJECTIVE IMPAIRMENTS: decreased mobility, decreased ROM, decreased strength, hypomobility, and pain.   ACTIVITY LIMITATIONS: bending and sitting  PARTICIPATION LIMITATIONS: community activity  PERSONAL FACTORS: Age, Past/current experiences, Time since onset of injury/illness/exacerbation, and 3+ comorbidities: Anemia, HTN, OA, Glaucoma, Hyperthyroidism  are also affecting patient's functional outcome.   REHAB POTENTIAL: Good  CLINICAL DECISION MAKING: Evolving/moderate complexity  EVALUATION COMPLEXITY: Moderate   GOALS: Goals reviewed with patient? No  SHORT TERM GOALS: Target date: 10/20/22  Pt will be independent with HEP to improve LBP, mobility, and strength to return to PLOF. Baseline: 09/30/23; 11/10/23: 100% compliant with HEP Goal status: MET  LONG TERM GOALS: Target date: 11/11/23  Pt will improve FOTO to target score to demonstrate clinically significant improvement in functional mobility. Baseline: 09/30/23: deferred to next session; 10/10/23: 69/70; 11/10/23: 83/70 Goal status: MET  2.  Pt will improve 30 sec STS to at least 13 reps for age matched norms to demonstrate improved LE strength and endurance for transfers and walking ADL's. Baseline: 09/30/23: 11 reps; 11/10/23: 20 reps Goal status: MET  3.  Pt will report worst pain as a 2/10 NPS with painful ADL's to demonstrate clinically significant improvement in pain. Baseline: 09/30/23: 9/10 NPS; 11/10/23: average, no pain. Had prior flare up upwards to 4/10 NPS.  Goal status: PARTIALLY MET.   PLAN:  PT FREQUENCY: 1-2x/week  PT DURATION: 6  weeks  PLANNED INTERVENTIONS: 97164- PT Re-evaluation, 97110-Therapeutic exercises, 97530- Therapeutic activity, 97112- Neuromuscular re-education, 97535- Self Care, 11914- Manual therapy, Patient/Family education, Joint mobilization, Joint manipulation, Spinal manipulation, Spinal mobilization, Cryotherapy, and Moist heat.  PLAN FOR NEXT SESSION: Discharged  Marc Senior. Fairly  IV, PT, DPT Physical Therapist- [place]  [facility]  11/10/2023, 12:06 PM

## 2023-11-12 ENCOUNTER — Ambulatory Visit: Payer: Medicare Other

## 2023-11-18 ENCOUNTER — Other Ambulatory Visit: Payer: Self-pay | Admitting: Internal Medicine

## 2024-01-20 ENCOUNTER — Encounter: Payer: Self-pay | Admitting: Family Medicine

## 2024-01-20 ENCOUNTER — Ambulatory Visit: Admitting: Family Medicine

## 2024-01-20 VITALS — BP 128/80 | HR 65 | Temp 98.4°F | Ht 63.0 in | Wt 172.4 lb

## 2024-01-20 DIAGNOSIS — M546 Pain in thoracic spine: Secondary | ICD-10-CM | POA: Diagnosis not present

## 2024-01-20 DIAGNOSIS — R7989 Other specified abnormal findings of blood chemistry: Secondary | ICD-10-CM

## 2024-01-20 DIAGNOSIS — I10 Essential (primary) hypertension: Secondary | ICD-10-CM

## 2024-01-20 DIAGNOSIS — R0602 Shortness of breath: Secondary | ICD-10-CM

## 2024-01-20 DIAGNOSIS — R0789 Other chest pain: Secondary | ICD-10-CM

## 2024-01-20 LAB — COMPREHENSIVE METABOLIC PANEL WITH GFR
ALT: 16 U/L (ref 0–35)
AST: 18 U/L (ref 0–37)
Albumin: 4 g/dL (ref 3.5–5.2)
Alkaline Phosphatase: 43 U/L (ref 39–117)
BUN: 15 mg/dL (ref 6–23)
CO2: 28 meq/L (ref 19–32)
Calcium: 9.9 mg/dL (ref 8.4–10.5)
Chloride: 105 meq/L (ref 96–112)
Creatinine, Ser: 1.05 mg/dL (ref 0.40–1.20)
GFR: 51.21 mL/min — ABNORMAL LOW (ref >60.00)
Glucose, Bld: 105 mg/dL — ABNORMAL HIGH (ref 70–99)
Potassium: 4.3 meq/L (ref 3.5–5.1)
Sodium: 138 meq/L (ref 135–145)
Total Bilirubin: 0.4 mg/dL (ref 0.2–1.2)
Total Protein: 7.6 g/dL (ref 6.0–8.3)

## 2024-01-20 LAB — CBC WITH DIFFERENTIAL/PLATELET
Basophils Absolute: 0 10*3/uL (ref 0.0–0.1)
Basophils Relative: 1.2 % (ref 0.0–3.0)
Eosinophils Absolute: 0.2 10*3/uL (ref 0.0–0.7)
Eosinophils Relative: 5.7 % — ABNORMAL HIGH (ref 0.0–5.0)
HCT: 34.1 % — ABNORMAL LOW (ref 36.0–46.0)
Hemoglobin: 11.5 g/dL — ABNORMAL LOW (ref 12.0–15.0)
Lymphocytes Relative: 30.5 % (ref 12.0–46.0)
Lymphs Abs: 0.9 10*3/uL (ref 0.7–4.0)
MCHC: 33.8 g/dL (ref 30.0–36.0)
MCV: 99.6 fl (ref 78.0–100.0)
Monocytes Absolute: 0.3 10*3/uL (ref 0.1–1.0)
Monocytes Relative: 10.4 % (ref 3.0–12.0)
Neutro Abs: 1.6 10*3/uL (ref 1.4–7.7)
Neutrophils Relative %: 52.2 % (ref 43.0–77.0)
Platelets: 203 10*3/uL (ref 150.0–400.0)
RBC: 3.43 Mil/uL — ABNORMAL LOW (ref 3.87–5.11)
RDW: 12.3 % (ref 11.5–15.5)
WBC: 3.1 10*3/uL — ABNORMAL LOW (ref 4.0–10.5)

## 2024-01-20 LAB — TROPONIN I (HIGH SENSITIVITY): High Sens Troponin I: 5 ng/L (ref 2–17)

## 2024-01-20 NOTE — Progress Notes (Signed)
 Acute Office Visit  Subjective:     Patient ID: Kelly Santos, female    DOB: 1945-11-30, 78 y.o.   MRN: [id number]  Chief Complaint  Patient presents with   Acute Visit    Left side pain, started on 04/13, pain has gotten a little better    HPI Patient is in today for evaluation of left chest pain, shortness of breath on exertion, radiating pain to the left shoulder and upper arm since 01/11/2024. States she has taken some Gas-X, not sure if this helped or not. Reports that pain is overall improving some. Reports compliance with medication regimen. Denies previous symptoms. Denies known injury, recent respiratory illness, pulled muscles, other symptoms. Denies any palpitations, increase in headaches, edema, change in urinary output, other concerns. Medical history as outlined below.  ROS Per HPI      Objective:    BP 128/80 (BP Location: Left Arm, Patient Position: Sitting)   Pulse 65   Temp 98.4 F (36.9 C) (Temporal)   Ht 5\' 3"  (1.6 m)   Wt 172 lb 6.4 oz (78.2 kg)   SpO2 99%   BMI 30.54 kg/m    Physical Exam Vitals and nursing note reviewed.  Constitutional:      General: She is not in acute distress.    Appearance: Normal appearance. She is normal weight.  HENT:     Head: Normocephalic and atraumatic.     Right Ear: External ear normal.     Left Ear: External ear normal.  Eyes:     Extraocular Movements: Extraocular movements intact.     Pupils: Pupils are equal, round, and reactive to light.  Cardiovascular:     Rate and Rhythm: Normal rate and regular rhythm.     Pulses: Normal pulses.     Heart sounds: Normal heart sounds.  Pulmonary:     Effort: Pulmonary effort is normal. No respiratory distress.     Breath sounds: Normal breath sounds. No wheezing, rhonchi or rales.  Musculoskeletal:        General: Normal range of motion.     Cervical back: Normal range of motion.     Right lower leg: No edema.     Left lower leg: No edema.  Lymphadenopathy:      Cervical: No cervical adenopathy.  Neurological:     General: No focal deficit present.     Mental Status: She is alert and oriented to person, place, and time.  Psychiatric:        Mood and Affect: Mood normal.        Thought Content: Thought content normal.    Results for orders placed or performed in visit on 01/20/24  CBC with Differential/Platelet  Result Value Ref Range   WBC 3.1 (L) 4.0 - 10.5 K/uL   RBC 3.43 (L) 3.87 - 5.11 Mil/uL   Hemoglobin 11.5 (L) 12.0 - 15.0 g/dL   HCT 40.9 (L) 81.1 - 91.4 %   MCV 99.6 78.0 - 100.0 fl   MCHC 33.8 30.0 - 36.0 g/dL   RDW 78.2 95.6 - 21.3 %   Platelets 203.0 150.0 - 400.0 K/uL   Neutrophils Relative % 52.2 43.0 - 77.0 %   Lymphocytes Relative 30.5 12.0 - 46.0 %   Monocytes Relative 10.4 3.0 - 12.0 %   Eosinophils Relative 5.7 (H) 0.0 - 5.0 %   Basophils Relative 1.2 0.0 - 3.0 %   Neutro Abs 1.6 1.4 - 7.7 K/uL   Lymphs Abs 0.9 0.7 -  4.0 K/uL   Monocytes Absolute 0.3 0.1 - 1.0 K/uL   Eosinophils Absolute 0.2 0.0 - 0.7 K/uL   Basophils Absolute 0.0 0.0 - 0.1 K/uL  Comprehensive metabolic panel with GFR  Result Value Ref Range   Sodium 138 135 - 145 mEq/L   Potassium 4.3 3.5 - 5.1 mEq/L   Chloride 105 96 - 112 mEq/L   CO2 28 19 - 32 mEq/L   Glucose, Bld 105 (H) 70 - 99 mg/dL   BUN 15 6 - 23 mg/dL   Creatinine, Ser 1.47 0.40 - 1.20 mg/dL   Total Bilirubin 0.4 0.2 - 1.2 mg/dL   Alkaline Phosphatase 43 39 - 117 U/L   AST 18 0 - 37 U/L   ALT 16 0 - 35 U/L   Total Protein 7.6 6.0 - 8.3 g/dL   Albumin 4.0 3.5 - 5.2 g/dL   GFR 82.95 (L) >62.13 mL/min   Calcium 9.9 8.4 - 10.5 mg/dL  Troponin I (High Sensitivity)  Result Value Ref Range   High Sens Troponin I 5 2 - 17 ng/L    EKG: Indication: SOBOE Rate: 64 Interpretation: NSR Changes from previous: none     Assessment & Plan:   Acute left-sided thoracic back pain -     EKG 12-Lead -     CBC with Differential/Platelet -     Comprehensive metabolic panel with GFR -      Troponin I (High Sensitivity); Future  SOB (shortness of breath) -     EKG 12-Lead -     CBC with Differential/Platelet -     Comprehensive metabolic panel with GFR -     D-dimer, quantitative -     Troponin I (High Sensitivity); Future  Primary hypertension -     EKG 12-Lead -     CBC with Differential/Platelet -     Troponin I (High Sensitivity); Future  Intermittent left-sided chest pain -     EKG 12-Lead -     CBC with Differential/Platelet -     Comprehensive metabolic panel with GFR -     D-dimer, quantitative -     Troponin I (High Sensitivity); Future  Checking labs today to help rule out more serious conditions, EKG stable today, no acute ischemia. Exam reassuring Discussed when to seek more acute care in the ER  No orders of the defined types were placed in this encounter.   Return if symptoms worsen or fail to improve.  Wellington Half, FNP

## 2024-01-20 NOTE — Patient Instructions (Addendum)
 We are checking labs today, will be in contact with any results that require further attention  EKG looks good today, no change from last. Low concern for heart attack.  Follow-up with me for new or worsening symptoms.  Follow up with cardiology as scheduled.

## 2024-01-21 ENCOUNTER — Ambulatory Visit
Admission: RE | Admit: 2024-01-21 | Discharge: 2024-01-21 | Disposition: A | Discharge status: Home / Self Care | Source: Ambulatory Visit | Attending: Family Medicine | Admitting: Family Medicine

## 2024-01-21 DIAGNOSIS — R911 Solitary pulmonary nodule: Secondary | ICD-10-CM | POA: Diagnosis not present

## 2024-01-21 DIAGNOSIS — R0602 Shortness of breath: Secondary | ICD-10-CM

## 2024-01-21 DIAGNOSIS — J9811 Atelectasis: Secondary | ICD-10-CM | POA: Diagnosis not present

## 2024-01-21 DIAGNOSIS — R7989 Other specified abnormal findings of blood chemistry: Secondary | ICD-10-CM

## 2024-01-21 DIAGNOSIS — I700 Atherosclerosis of aorta: Secondary | ICD-10-CM | POA: Diagnosis not present

## 2024-01-21 DIAGNOSIS — R0789 Other chest pain: Secondary | ICD-10-CM

## 2024-01-21 LAB — D-DIMER, QUANTITATIVE: D-Dimer, Quant: 1.88 ug{FEU}/mL — ABNORMAL HIGH (ref <0.50)

## 2024-01-21 MED ORDER — IOPAMIDOL (ISOVUE-370) INJECTION 76%
60.0000 mL | Freq: Once | INTRAVENOUS | Status: AC | PRN
  Administered 2024-01-21: 60 mL via INTRAVENOUS

## 2024-01-21 NOTE — Addendum Note (Signed)
 Addended by: Wellington Half on: 01/21/2024 09:47 AM   Modules accepted: Orders

## 2024-02-11 DIAGNOSIS — Z1231 Encounter for screening mammogram for malignant neoplasm of breast: Secondary | ICD-10-CM | POA: Diagnosis not present

## 2024-02-17 ENCOUNTER — Other Ambulatory Visit (HOSPITAL_BASED_OUTPATIENT_CLINIC_OR_DEPARTMENT_OTHER): Payer: Self-pay | Admitting: Internal Medicine

## 2024-02-28 ENCOUNTER — Other Ambulatory Visit (HOSPITAL_BASED_OUTPATIENT_CLINIC_OR_DEPARTMENT_OTHER): Payer: Self-pay | Admitting: Cardiovascular Disease

## 2024-03-22 ENCOUNTER — Ambulatory Visit (INDEPENDENT_AMBULATORY_CARE_PROVIDER_SITE_OTHER): Payer: Medicare Other

## 2024-03-22 VITALS — Ht 63.0 in | Wt 172.0 lb

## 2024-03-22 DIAGNOSIS — Z0000 Encounter for general adult medical examination without abnormal findings: Secondary | ICD-10-CM

## 2024-03-22 NOTE — Patient Instructions (Signed)
 Ms. Kelly Santos , Thank you for taking time out of your busy schedule to complete your Annual Wellness Visit with me. I enjoyed our conversation and look forward to speaking with you again next year. I, as well as your care team,  appreciate your ongoing commitment to your health goals. Please review the following plan we discussed and let me know if I can assist you in the future. Your Game plan/ To Do List   Follow up Visits: Next Medicare AWV with our clinical staff: 03/23/2025.   Have you seen your provider in the last 6 months (3 months if uncontrolled diabetes)? Yes Next Office Visit with your provider: 04/29/2024.  Clinician Recommendations:  Aim for 30 minutes of exercise or brisk walking, 6-8 glasses of water, and 5 servings of fruits and vegetables each day. You are due for a tetanus vaccine.  Keep up the good work.      This is a list of the screening recommended for you and due dates:  Health Maintenance  Topic Date Due   Hepatitis C Screening  Never done   DTaP/Tdap/Td vaccine (2 - Tdap) 09/21/2020   COVID-19 Vaccine (3 - 2024-25 season) 06/01/2023   Mammogram  02/10/2024   Medicare Annual Wellness Visit  03/12/2024   Flu Shot  04/30/2024   Pneumococcal Vaccine for age over 75  Completed   DEXA scan (bone density measurement)  Completed   HPV Vaccine  Aged Out   Meningitis B Vaccine  Aged Out   Colon Cancer Screening  Discontinued   Zoster (Shingles) Vaccine  Discontinued    Advanced directives: (Declined) Advance directive discussed with you today. Even though you declined this today, please call our office should you change your mind, and we can give you the proper paperwork for you to fill out. Advance Care Planning is important because it:  [x]  Makes sure you receive the medical care that is consistent with your values, goals, and preferences  [x]  It provides guidance to your family and loved ones and reduces their decisional burden about whether or not they are making the  right decisions based on your wishes.  Follow the link provided in your after visit summary or read over the paperwork we have mailed to you to help you started getting your Advance Directives in place. If you need assistance in completing these, please reach out to us  so that we can help you!  See attachments for Preventive Care and Fall Prevention Tips.

## 2024-03-22 NOTE — Progress Notes (Cosign Needed)
 Subjective:   Kelly Santos is a 78 y.o. who presents for a Medicare Wellness preventive visit.  As a reminder, Annual Wellness Visits don't include a physical exam, and some assessments may be limited, especially if this visit is performed virtually. We may recommend an in-person follow-up visit with your provider if needed.  Visit Complete: Virtual I connected with  Camy T Courville on 03/22/24 by a audio enabled telemedicine application and verified that I am speaking with the correct person using two identifiers.  Patient Location: Home  Provider Location: Home Office  I discussed the limitations of evaluation and management by telemedicine. The patient expressed understanding and agreed to proceed.  Vital Signs: Because this visit was a virtual/telehealth visit, some criteria may be missing or patient reported. Any vitals not documented were not able to be obtained and vitals that have been documented are patient reported.  VideoDeclined- This patient declined Librarian, academic. Therefore the visit was completed with audio only.  Persons Participating in Visit: Patient.  AWV Questionnaire: No: Patient Medicare AWV questionnaire was not completed prior to this visit.  Cardiac Risk Factors include: advanced age (>60men, >42 women);hypertension;Other (see comment), Risk factor comments: Aortic atherosclerosis     Objective:    Today's Vitals   03/22/24 1458  Weight: 172 lb (78 kg)  Height: 5' 3 (1.6 m)   Body mass index is 30.47 kg/m.     03/22/2024    3:16 PM 09/30/2023    9:50 AM 03/13/2023    3:03 PM 03/15/2022    9:39 AM 06/06/2020    4:09 PM 03/26/2020   11:26 PM 05/06/2018    1:44 PM  Advanced Directives  Does Patient Have a Medical Advance Directive? No No No No No No No   Would patient like information on creating a medical advance directive?  No - Patient declined No - Patient declined No - Patient declined        Data saved with a  previous flowsheet row definition    Current Medications (verified) Outpatient Encounter Medications as of 03/22/2024  Medication Sig   amitriptyline  (ELAVIL ) 25 MG tablet TAKE 1 TABLET (25 MG TOTAL) BY MOUTH AT BEDTIME AS NEEDED FOR SLEEP. DUE FOR ROUTINE OV   azithromycin  (ZITHROMAX  Z-PAK) 250 MG tablet As directed   brimonidine  (ALPHAGAN ) 0.2 % ophthalmic solution SMARTSIG:1 Drop(s) In Eye(s) Every 12 Hours   candesartan  (ATACAND ) 32 MG tablet TAKE 1 TABLET BY MOUTH DAILY.   cholecalciferol (VITAMIN D ) 1000 UNITS tablet Take 1,000 Units by mouth every other day.    COMBIGAN  0.2-0.5 % ophthalmic solution INSTILL 1 DROP INTO BOTH EYES TWICE A DAY   Ferrous Sulfate (IRON) 325 (65 FE) MG TABS Take 1 tablet by mouth 3 (three) times a week.    hydrochlorothiazide  (HYDRODIURIL ) 12.5 MG tablet TAKE 1 TABLET BY MOUTH DAILY. CALL OFFICE FOR APPOINTMENT, NO MORE REFILLS UNTIL SEEN   hydrochlorothiazide  (MICROZIDE ) 12.5 MG capsule Take 12.5 mg by mouth daily.   hydrocortisone  (PROCTOCORT ) 1 % CREA Apply 1 application topically 2 (two) times daily as needed (dry skin).    ketoconazole  (NIZORAL ) 2 % cream APPLY 1 APPLICATION TOPICALLY DAILY   pantoprazole  (PROTONIX ) 40 MG tablet TAKE 1 TABLET BY MOUTH EVERY DAY   spironolactone  (ALDACTONE ) 50 MG tablet TAKE 1 TABLET BY MOUTH DAILY   timolol  (TIMOPTIC ) 0.5 % ophthalmic solution 1 drop every morning.   triamcinolone  ointment (KENALOG ) 0.1 % Apply 1 Application topically 2 (two) times daily.  No facility-administered encounter medications on file as of 03/22/2024.    Allergies (verified) Amlodipine , Hctz [hydrochlorothiazide ], Metoprolol , and Tizanidine   History: Past Medical History:  Diagnosis Date   Anemia    Aortic atherosclerosis (HCC) 11/07/2021   CAD (coronary artery disease)    no cardiologist, followed by PCP only   Cataract    bil cataracts removed   Diverticulosis    Generalized headaches    GERD (gastroesophageal reflux disease)     Glaucoma    Hemorrhoids    Hiatal hernia    On CT done Jan 31, 2011    Hx of colonic polyps    Hypertension    Hyperthyroidism    Osteoarthritis    Uterine polyp    Vitamin D  deficiency    Past Surgical History:  Procedure Laterality Date   ABDOMINAL HYSTERECTOMY N/A 04/26/2013   Procedure: HYSTERECTOMY ABDOMINAL;  Surgeon: Rosaline LITTIE Cobble, MD;  Location: [facility];  Service: Gynecology;  Laterality: N/A;   COLONOSCOPY     DILATION AND CURETTAGE OF UTERUS  01/18/2013   with uterine polypectomy   SALPINGOOPHORECTOMY Bilateral 04/26/2013   Procedure: SALPINGO OOPHORECTOMY;  Surgeon: Rosaline LITTIE Cobble, MD;  Location: [facility];  Service: Gynecology;  Laterality: Bilateral;   UPPER GASTROINTESTINAL ENDOSCOPY     Family History  Problem Relation Age of Onset   Hypertension Mother    Hypertension Father    Heart failure Father    Deep vein thrombosis Son 35       x 2, chronic coumadin   Hypertension Sister    CVA Brother    Diabetes Brother    Hypertension Brother    Colon cancer Neg Hx    Esophageal cancer Neg Hx    Stomach cancer Neg Hx    Rectal cancer Neg Hx    Social History   Socioeconomic History   Marital status: Divorced    Spouse name: n/a   Number of children: 3   Years of education: Not on file   Highest education level: Not on file  Occupational History   Occupation: RETIRED/CNA    Employer: [employer]    Comment: [employer]  Tobacco Use   Smoking status: Never   Smokeless tobacco: Never  Vaping Use   Vaping status: Never Used  Substance and Sexual Activity   Alcohol use: No    Alcohol/week: 0.0 standard drinks of alcohol   Drug use: No   Sexual activity: Yes  Other Topics Concern   Not on file  Social History Narrative      Lives alone/2025   Social Drivers of Health   Financial Resource Strain: Low Risk  (03/22/2024)   Overall Financial Resource Strain (CARDIA)    Difficulty of Paying Living Expenses: Not very hard  Food  Insecurity: No Food Insecurity (03/22/2024)   Hunger Vital Sign    Worried About Running Out of Food in the Last Year: Never true    Ran Out of Food in the Last Year: Never true  Transportation Needs: No Transportation Needs (03/22/2024)   PRAPARE - Administrator, Civil Service (Medical): No    Lack of Transportation (Non-Medical): No  Physical Activity: Sufficiently Active (03/22/2024)   Exercise Vital Sign    Days of Exercise per Week: 4 days    Minutes of Exercise per Session: 40 min  Stress: No Stress Concern Present (03/22/2024)   [employer] of Occupational Health - Occupational Stress Questionnaire    Feeling of Stress:  Not at all  Social Connections: Moderately Integrated (03/22/2024)   Social Connection and Isolation Panel    Frequency of Communication with Friends and Family: More than three times a week    Frequency of Social Gatherings with Friends and Family: Once a week    Attends Religious Services: More than 4 times per year    Active Member of [employer] or Organizations: Yes    Attends Banker Meetings: Never    Marital Status: Divorced    Tobacco Counseling Counseling given: Not Answered    Clinical Intake:  Pre-visit preparation completed: Yes  Pain : No/denies pain     BMI - recorded: 30.47 Nutritional Status: BMI > 30  Obese Nutritional Risks: None Diabetes: No  Lab Results  Component Value Date   HGBA1C 6.1 08/15/2022   HGBA1C 6.0 04/03/2022   HGBA1C 6.0 04/03/2022     How often do you need to have someone help you when you read instructions, pamphlets, or other written materials from your doctor or pharmacy?: 1 - Never  Interpreter Needed?: No  Information entered by :: Ilayda Toda, RMA   Activities of Daily Living     03/22/2024    2:59 PM  In your present state of health, do you have any difficulty performing the following activities:  Hearing? 0  Vision? 0  Difficulty concentrating or making decisions? 0   Walking or climbing stairs? 0  Dressing or bathing? 0  Doing errands, shopping? 0  Preparing Food and eating ? N  Using the Toilet? N  In the past six months, have you accidently leaked urine? N  Do you have problems with loss of bowel control? N  Managing your Medications? N  Managing your Finances? N  Housekeeping or managing your Housekeeping? N    Patient Care Team: Plotnikov, Karlynn GAILS, MD as PCP - General Raford Riggs, MD as Attending Physician (Cardiology) Mat Browning, MD as Consulting Physician (Obstetrics and Gynecology) Vernona Slough, OD as Consulting Physician (Optometry)  I have updated your Care Teams any recent Medical Services you may have received from other providers in the past year.     Assessment:   This is a routine wellness examination for Lysbeth.  Hearing/Vision screen Hearing Screening - Comments:: Denies hearing difficulties   Vision Screening - Comments:: Wears eyeglasses/    Goals Addressed             This Visit's Progress    My goal for 2024 is to stay consistent with walking and lose weight.   On track      Depression Screen     03/22/2024    3:19 PM 04/07/2023    8:49 AM 03/13/2023    3:05 PM 08/15/2022   10:03 AM 03/15/2022    9:34 AM 03/09/2021    1:37 PM 01/26/2020   10:53 AM  PHQ 2/9 Scores  PHQ - 2 Score 0 0 0 0 0 0 0  PHQ- 9 Score 0 0 2        Fall Risk     03/22/2024    3:17 PM 08/21/2023   10:43 AM 04/07/2023    8:49 AM 03/13/2023    3:04 PM 08/15/2022   10:03 AM  Fall Risk   Falls in the past year? 1 1 0 0 0  Comment 2024      Number falls in past yr: 0 0 0 0 0  Injury with Fall? 0 0 0 0 0  Risk for fall due  to :  No Fall Risks No Fall Risks No Fall Risks No Fall Risks  Follow up Falls evaluation completed;Falls prevention discussed Falls evaluation completed Falls evaluation completed Falls prevention discussed Falls evaluation completed      Data saved with a previous flowsheet row definition     MEDICARE RISK AT HOME:  Medicare Risk at Home Any stairs in or around the home?: No If so, are there any without handrails?: No Home free of loose throw rugs in walkways, pet beds, electrical cords, etc?: Yes Adequate lighting in your home to reduce risk of falls?: Yes Life alert?: No Use of a cane, walker or w/c?: No Grab bars in the bathroom?: Yes Shower chair or bench in shower?: Yes Elevated toilet seat or a handicapped toilet?: Yes  TIMED UP AND GO:  Was the test performed?  No  Cognitive Function: Declined/Normal: No cognitive concerns noted by patient or family. Patient alert, oriented, able to answer questions appropriately and recall recent events. No signs of memory loss or confusion.        03/13/2023    3:06 PM 03/15/2022    9:42 AM  6CIT Screen  What Year? 0 points 0 points  What month? 0 points 0 points  What time? 0 points 0 points  Count back from 20 0 points 0 points  Months in reverse 0 points 0 points  Repeat phrase 0 points 2 points  Total Score 0 points 2 points    Immunizations Immunization History  Administered Date(s) Administered   PFIZER(Purple Top)SARS-COV-2 Vaccination 09/20/2020, 10/18/2020   Pneumococcal Conjugate-13 01/31/2015   Pneumococcal Polysaccharide-23 04/27/2013   Td 09/21/2010    Screening Tests Health Maintenance  Topic Date Due   Hepatitis C Screening  Never done   DTaP/Tdap/Td (2 - Tdap) 09/21/2020   COVID-19 Vaccine (3 - 2024-25 season) 06/01/2023   MAMMOGRAM  02/10/2024   Medicare Annual Wellness (AWV)  03/12/2024   INFLUENZA VACCINE  04/30/2024   Pneumococcal Vaccine: 50+ Years  Completed   DEXA SCAN  Completed   HPV VACCINES  Aged Out   Meningococcal B Vaccine  Aged Out   Colonoscopy  Discontinued   Zoster Vaccines- Shingrix  Discontinued    Health Maintenance  Health Maintenance Due  Topic Date Due   Hepatitis C Screening  Never done   DTaP/Tdap/Td (2 - Tdap) 09/21/2020   COVID-19 Vaccine (3 - 2024-25  season) 06/01/2023   MAMMOGRAM  02/10/2024   Medicare Annual Wellness (AWV)  03/12/2024   Health Maintenance Items Addressed: See Nurse Notes at the end of this note  Additional Screening:  Vision Screening: Recommended annual ophthalmology exams for early detection of glaucoma and other disorders of the eye. Would you like a referral to an eye doctor? No    Dental Screening: Recommended annual dental exams for proper oral hygiene  Community Resource Referral / Chronic Care Management: CRR required this visit?  No   CCM required this visit?  No   Plan:    I have personally reviewed and noted the following in the patient's chart:   Medical and social history Use of alcohol, tobacco or illicit drugs  Current medications and supplements including opioid prescriptions. Patient is not currently taking opioid prescriptions. Functional ability and status Nutritional status Physical activity Advanced directives List of other physicians Hospitalizations, surgeries, and ER visits in previous 12 months Vitals Screenings to include cognitive, depression, and falls Referrals and appointments  In addition, I have reviewed and discussed with patient certain  preventive protocols, quality metrics, and best practice recommendations. A written personalized care plan for preventive services as well as general preventive health recommendations were provided to patient.   Husein Guedes L Jaxn Chiquito, CMA   03/22/2024   After Visit Summary: (MyChart) Due to this being a telephonic visit, the after visit summary with patients personalized plan was offered to patient via MyChart   Notes: Patient is due for a Tdap.  She declines the Shingrix vaccine.  She is also due for a Hep C screening, which can be done during her next lab visit/CPE.  Patient stated that she has had a recent mammogram.  I have sent a request for records out today.  She had no other concerns to address today.    Medical screening  examination/treatment/procedure(s) were performed by non-physician practitioner and as supervising physician I was immediately available for consultation/collaboration.  I agree with above. Karlynn Noel, MD

## 2024-04-24 ENCOUNTER — Other Ambulatory Visit: Payer: Self-pay | Admitting: Internal Medicine

## 2024-04-29 ENCOUNTER — Ambulatory Visit (INDEPENDENT_AMBULATORY_CARE_PROVIDER_SITE_OTHER): Admitting: Internal Medicine

## 2024-04-29 ENCOUNTER — Ambulatory Visit (INDEPENDENT_AMBULATORY_CARE_PROVIDER_SITE_OTHER)

## 2024-04-29 ENCOUNTER — Encounter: Payer: Self-pay | Admitting: Internal Medicine

## 2024-04-29 VITALS — BP 133/79 | HR 59 | Temp 99.1°F | Ht 63.0 in | Wt 174.0 lb

## 2024-04-29 DIAGNOSIS — M25562 Pain in left knee: Secondary | ICD-10-CM

## 2024-04-29 DIAGNOSIS — G8929 Other chronic pain: Secondary | ICD-10-CM

## 2024-04-29 DIAGNOSIS — I10 Essential (primary) hypertension: Secondary | ICD-10-CM

## 2024-04-29 DIAGNOSIS — D500 Iron deficiency anemia secondary to blood loss (chronic): Secondary | ICD-10-CM | POA: Diagnosis not present

## 2024-04-29 DIAGNOSIS — M1712 Unilateral primary osteoarthritis, left knee: Secondary | ICD-10-CM | POA: Diagnosis not present

## 2024-04-29 DIAGNOSIS — R143 Flatulence: Secondary | ICD-10-CM | POA: Diagnosis not present

## 2024-04-29 DIAGNOSIS — M7989 Other specified soft tissue disorders: Secondary | ICD-10-CM | POA: Diagnosis not present

## 2024-04-29 LAB — COMPREHENSIVE METABOLIC PANEL WITH GFR
ALT: 21 U/L (ref 0–35)
AST: 21 U/L (ref 0–37)
Albumin: 4.1 g/dL (ref 3.5–5.2)
Alkaline Phosphatase: 53 U/L (ref 39–117)
BUN: 13 mg/dL (ref 6–23)
CO2: 29 meq/L (ref 19–32)
Calcium: 10.1 mg/dL (ref 8.4–10.5)
Chloride: 101 meq/L (ref 96–112)
Creatinine, Ser: 1.21 mg/dL — ABNORMAL HIGH (ref 0.40–1.20)
GFR: 43.11 mL/min — ABNORMAL LOW (ref >60.00)
Glucose, Bld: 106 mg/dL — ABNORMAL HIGH (ref 70–99)
Potassium: 4.1 meq/L (ref 3.5–5.1)
Sodium: 135 meq/L (ref 135–145)
Total Bilirubin: 0.4 mg/dL (ref 0.2–1.2)
Total Protein: 7.6 g/dL (ref 6.0–8.3)

## 2024-04-29 LAB — CBC WITH DIFFERENTIAL/PLATELET
Basophils Absolute: 0 K/uL (ref 0.0–0.1)
Basophils Relative: 1 % (ref 0.0–3.0)
Eosinophils Absolute: 0.2 K/uL (ref 0.0–0.7)
Eosinophils Relative: 5.6 % — ABNORMAL HIGH (ref 0.0–5.0)
HCT: 33.5 % — ABNORMAL LOW (ref 36.0–46.0)
Hemoglobin: 11.7 g/dL — ABNORMAL LOW (ref 12.0–15.0)
Lymphocytes Relative: 31.8 % (ref 12.0–46.0)
Lymphs Abs: 1 K/uL (ref 0.7–4.0)
MCHC: 34.8 g/dL (ref 30.0–36.0)
MCV: 97.9 fl (ref 78.0–100.0)
Monocytes Absolute: 0.3 K/uL (ref 0.1–1.0)
Monocytes Relative: 10 % (ref 3.0–12.0)
Neutro Abs: 1.5 K/uL (ref 1.4–7.7)
Neutrophils Relative %: 51.6 % (ref 43.0–77.0)
Platelets: 206 K/uL (ref 150.0–400.0)
RBC: 3.43 Mil/uL — ABNORMAL LOW (ref 3.87–5.11)
RDW: 12.6 % (ref 11.5–15.5)
WBC: 3 K/uL — ABNORMAL LOW (ref 4.0–10.5)

## 2024-04-29 LAB — LIPID PANEL
Cholesterol: 165 mg/dL (ref 0–200)
HDL: 38.1 mg/dL — ABNORMAL LOW (ref >39.00)
LDL Cholesterol: 81 mg/dL (ref 0–99)
NonHDL: 126.65
Total CHOL/HDL Ratio: 4
Triglycerides: 226 mg/dL — ABNORMAL HIGH (ref 0.0–149.0)
VLDL: 45.2 mg/dL — ABNORMAL HIGH (ref 0.0–40.0)

## 2024-04-29 LAB — TSH: TSH: 0.76 u[IU]/mL (ref 0.35–5.50)

## 2024-04-29 MED ORDER — CANDESARTAN CILEXETIL 32 MG PO TABS: 32.0000 mg | ORAL_TABLET | Freq: Every day | ORAL | 3 refills | Status: AC

## 2024-04-29 MED ORDER — AMITRIPTYLINE HCL 25 MG PO TABS: 25.0000 mg | ORAL_TABLET | Freq: Every evening | ORAL | 2 refills | Status: AC | PRN

## 2024-04-29 MED ORDER — PANTOPRAZOLE SODIUM 40 MG PO TBEC: 40.0000 mg | DELAYED_RELEASE_TABLET | Freq: Every day | ORAL | 3 refills | Status: AC

## 2024-04-29 MED ORDER — SPIRONOLACTONE 50 MG PO TABS: 50.0000 mg | ORAL_TABLET | Freq: Every day | ORAL | 2 refills | Status: AC

## 2024-04-29 MED ORDER — HYDROCHLOROTHIAZIDE 12.5 MG PO CAPS: 12.5000 mg | ORAL_CAPSULE | Freq: Every day | ORAL | 3 refills | Status: AC

## 2024-04-29 NOTE — Progress Notes (Signed)
 Subjective:  Patient ID: Kelly Santos, female    DOB: 1945-11-12  Age: 78 y.o. MRN: [id number]  CC: Annual Exam   HPI [name] presents for L knee pain, swelling C/o abd pain w/BMs - gas and cramps  Outpatient Medications Prior to Visit  Medication Sig Dispense Refill   azithromycin  (ZITHROMAX  Z-PAK) 250 MG tablet As directed 6 tablet 0   brimonidine  (ALPHAGAN ) 0.2 % ophthalmic solution SMARTSIG:1 Drop(s) In Eye(s) Every 12 Hours     cholecalciferol (VITAMIN D ) 1000 UNITS tablet Take 1,000 Units by mouth every other day.      COMBIGAN  0.2-0.5 % ophthalmic solution INSTILL 1 DROP INTO BOTH EYES TWICE A DAY 5 mL 2   Ferrous Sulfate (IRON) 325 (65 FE) MG TABS Take 1 tablet by mouth 3 (three) times a week.      hydrochlorothiazide  (HYDRODIURIL ) 12.5 MG tablet TAKE 1 TABLET BY MOUTH DAILY. CALL OFFICE FOR APPOINTMENT, NO MORE REFILLS UNTIL SEEN 90 tablet 3   hydrocortisone  (PROCTOCORT ) 1 % CREA Apply 1 application topically 2 (two) times daily as needed (dry skin).      ketoconazole  (NIZORAL ) 2 % cream APPLY 1 APPLICATION TOPICALLY DAILY 45 g 1   timolol  (TIMOPTIC ) 0.5 % ophthalmic solution 1 drop every morning.     triamcinolone  ointment (KENALOG ) 0.1 % APPLY TO AFFECTED AREA TWICE A DAY 160 g 3   amitriptyline  (ELAVIL ) 25 MG tablet TAKE 1 TABLET (25 MG TOTAL) BY MOUTH AT BEDTIME AS NEEDED FOR SLEEP. DUE FOR ROUTINE OV 90 tablet 2   candesartan  (ATACAND ) 32 MG tablet TAKE 1 TABLET BY MOUTH DAILY. 90 tablet 3   hydrochlorothiazide  (MICROZIDE ) 12.5 MG capsule Take 12.5 mg by mouth daily.     pantoprazole  (PROTONIX ) 40 MG tablet TAKE 1 TABLET BY MOUTH EVERY DAY 90 tablet 3   spironolactone  (ALDACTONE ) 50 MG tablet TAKE 1 TABLET BY MOUTH DAILY 100 tablet 2   No facility-administered medications prior to visit.    ROS: Review of Systems  Constitutional:  Positive for fatigue. Negative for activity change, appetite change, chills and unexpected weight change.  HENT:  Negative for  congestion, mouth sores and sinus pressure.   Eyes:  Negative for visual disturbance.  Respiratory:  Negative for cough and chest tightness.   Gastrointestinal:  Positive for abdominal pain. Negative for abdominal distention, blood in stool, constipation and nausea.  Genitourinary:  Negative for difficulty urinating, frequency, urgency and vaginal pain.  Musculoskeletal:  Negative for back pain and gait problem.  Skin:  Negative for pallor and rash.  Neurological:  Negative for dizziness, tremors, weakness, numbness and headaches.  Psychiatric/Behavioral:  Negative for confusion and sleep disturbance.     Objective:  BP 133/79 (BP Location: Left Arm, Patient Position: Sitting, Cuff Size: Normal)   Pulse (!) 59   Temp 99.1 F (37.3 C) (Oral)   Ht 5' 3 (1.6 m)   Wt 174 lb (78.9 kg)   SpO2 100%   BMI 30.82 kg/m   BP Readings from Last 3 Encounters:  04/29/24 133/79  01/20/24 128/80  10/28/23 120/82    Wt Readings from Last 3 Encounters:  04/29/24 174 lb (78.9 kg)  03/22/24 172 lb (78 kg)  01/20/24 172 lb 6.4 oz (78.2 kg)    Physical Exam Constitutional:      General: She is not in acute distress.    Appearance: She is well-developed.  HENT:     Head: Normocephalic.     Right Ear: External  ear normal.     Left Ear: External ear normal.     Nose: Nose normal.  Eyes:     General:        Right eye: No discharge.        Left eye: No discharge.     Conjunctiva/sclera: Conjunctivae normal.     Pupils: Pupils are equal, round, and reactive to light.  Neck:     Thyroid : No thyromegaly.     Vascular: No JVD.     Trachea: No tracheal deviation.  Cardiovascular:     Rate and Rhythm: Normal rate and regular rhythm.     Heart sounds: Normal heart sounds.  Pulmonary:     Effort: No respiratory distress.     Breath sounds: No stridor. No wheezing.  Abdominal:     General: Bowel sounds are normal. There is no distension.     Palpations: Abdomen is soft. There is no mass.      Tenderness: There is no abdominal tenderness. There is no guarding or rebound.  Musculoskeletal:        General: No tenderness.     Cervical back: Normal range of motion and neck supple. No rigidity.  Lymphadenopathy:     Cervical: No cervical adenopathy.  Skin:    Findings: No erythema or rash.  Neurological:     Cranial Nerves: No cranial nerve deficit.     Motor: No abnormal muscle tone.     Coordination: Coordination normal.     Deep Tendon Reflexes: Reflexes normal.  Psychiatric:        Behavior: Behavior normal.        Thought Content: Thought content normal.        Judgment: Judgment normal.   L knee w/pai  Lab Results  Component Value Date   WBC 3.1 (L) 01/20/2024   HGB 11.5 (L) 01/20/2024   HCT 34.1 (L) 01/20/2024   PLT 203.0 01/20/2024   GLUCOSE 105 (H) 01/20/2024   CHOL 163 04/07/2023   TRIG 162.0 (H) 04/07/2023   HDL 36.10 (L) 04/07/2023   LDLCALC 94 04/07/2023   ALT 16 01/20/2024   AST 18 01/20/2024   NA 138 01/20/2024   K 4.3 01/20/2024   CL 105 01/20/2024   CREATININE 1.05 01/20/2024   BUN 15 01/20/2024   CO2 28 01/20/2024   TSH 1.38 04/07/2023   INR 0.96 05/09/2011   HGBA1C 6.1 08/15/2022    CT Angio Chest Pulmonary Embolism (PE) W or WO Contrast Result Date: 01/21/2024 CLINICAL DATA:  Evaluate for pulmonary embolism. Shortness of breath and positive D-dimer. EXAM: CT ANGIOGRAPHY CHEST WITH CONTRAST TECHNIQUE: Multidetector CT imaging of the chest was performed using the standard protocol during bolus administration of intravenous contrast. Multiplanar CT image reconstructions and MIPs were obtained to evaluate the vascular anatomy. RADIATION DOSE REDUCTION: This exam was performed according to the departmental dose-optimization program which includes automated exposure control, adjustment of the mA and/or kV according to patient size and/or use of iterative reconstruction technique. CONTRAST:  60mL ISOVUE -370 IOPAMIDOL  (ISOVUE -370) INJECTION 76%  COMPARISON:  06/06/2020 FINDINGS: Cardiovascular: Satisfactory opacification of the pulmonary arteries to the segmental level. No evidence of pulmonary embolism. There is mild cardiac enlargement. No pericardial effusion. Aortic atherosclerosis. Mediastinum/Nodes: No enlarged mediastinal, hilar, or axillary lymph nodes. Thyroid  gland, trachea, and esophagus demonstrate no significant findings. Lungs/Pleura: No pleural effusion identified. Areas of scar versus subsegmental atelectasis noted within the right middle lobe and both lower lobes. No airspace consolidation. No pneumothorax identified. 4  mm nodule within the periphery of the right upper lobe, image 48/5. This is unchanged when compared with examination from 2021 compatible with a benign nodule. No new or suspicious lung nodules. Upper Abdomen: No acute abnormality. Musculoskeletal: No chest wall abnormality. No acute or significant osseous findings. Review of the MIP images confirms the above findings. IMPRESSION: 1. No evidence for acute pulmonary embolus. 2. Areas of scar versus subsegmental atelectasis noted within the right middle lobe and both lower lobes. 3. 4 mm nodule within the periphery of the right upper lobe. This is stable from 06/06/2020 compatible with a benign nodule. 4.  Aortic Atherosclerosis (ICD10-I70.0). Electronically Signed   By: Waddell Calk M.D.   On: 01/21/2024 14:49    Assessment & Plan:   Problem List Items Addressed This Visit     Anemia due to chronic blood loss   Relevant Orders   TSH   CBC with Differential/Platelet   Comprehensive metabolic panel with GFR   Excessive gas - Primary   Use GasEx Use Kefir or Probiotics Use Colace      Relevant Orders   TSH   Urinalysis   CBC with Differential/Platelet   Lipid panel   Comprehensive metabolic panel with GFR   Iron, TIBC and Ferritin Panel   Hypertension   Much better on hydrochlorothiazide , Spironolactone        Relevant Medications    hydrochlorothiazide  (MICROZIDE ) 12.5 MG capsule   spironolactone  (ALDACTONE ) 50 MG tablet   candesartan  (ATACAND ) 32 MG tablet   Other Relevant Orders   TSH   Urinalysis   CBC with Differential/Platelet   Lipid panel   Comprehensive metabolic panel with GFR   Iron, TIBC and Ferritin Panel   Knee pain, left   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    Dispense:  90 capsule    Refill:  3   amitriptyline  (ELAVIL ) 25 MG tablet    Sig: Take 1 tablet (25 mg total) by mouth at bedtime as needed for sleep. Due for routine OV    Dispense:  90 tablet    Refill:  2   spironolactone  (ALDACTONE ) 50 MG tablet    Sig: Take 1 tablet (50 mg total) by mouth daily.    Dispense:  100 tablet    Refill:  2   candesartan  (ATACAND ) 32 MG tablet    Sig: Take 1 tablet (32 mg total) by mouth daily.    Dispense:  90 tablet    Refill:  3   pantoprazole  (PROTONIX ) 40 MG tablet    Sig: Take 1 tablet (40 mg total) by mouth daily.    Dispense:  90 tablet    Refill:  3      Follow-up: Return in about 3 months (around 07/30/2024) for a follow-up visit.  Marolyn Noel, MD

## 2024-04-29 NOTE — Assessment & Plan Note (Signed)
 Use GasEx Use Kefir or Probiotics Use Colace

## 2024-04-29 NOTE — Assessment & Plan Note (Signed)
 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

## 2024-04-29 NOTE — Assessment & Plan Note (Signed)
 Much better on hydrochlorothiazide, Spironolactone

## 2024-04-29 NOTE — Patient Instructions (Addendum)
 Use GasEx Use Kefir or Probiotics Use Colace  Sleeve brace Blue-Emu cream  use 2-3 times a day

## 2024-04-30 ENCOUNTER — Ambulatory Visit: Payer: Self-pay | Admitting: Internal Medicine

## 2024-04-30 ENCOUNTER — Other Ambulatory Visit: Payer: Self-pay | Admitting: Internal Medicine

## 2024-04-30 DIAGNOSIS — N183 Chronic kidney disease, stage 3 unspecified: Secondary | ICD-10-CM

## 2024-04-30 LAB — URINALYSIS
Bilirubin Urine: NEGATIVE
Hgb urine dipstick: NEGATIVE
Ketones, ur: NEGATIVE
Leukocytes,Ua: NEGATIVE
Nitrite: NEGATIVE
Specific Gravity, Urine: <=1.005 — AB (ref 1.000–1.030)
Total Protein, Urine: NEGATIVE
Urine Glucose: NEGATIVE
Urobilinogen, UA: 0.2 (ref 0.0–1.0)
pH: 6 (ref 5.0–8.0)

## 2024-04-30 LAB — IRON,TIBC AND FERRITIN PANEL
%SAT: 37 % (ref 16–45)
Ferritin: 137 ng/mL (ref 16–288)
Iron: 123 ug/dL (ref 45–160)
TIBC: 331 ug/dL (ref 250–450)

## 2024-05-03 DIAGNOSIS — N183 Chronic kidney disease, stage 3 unspecified: Secondary | ICD-10-CM | POA: Insufficient documentation

## 2024-05-18 ENCOUNTER — Telehealth: Payer: Self-pay | Admitting: Internal Medicine

## 2024-05-18 NOTE — Telephone Encounter (Signed)
 Patient called regarding a refill of triamcinolone  cream (KENALOG ) 0.5 % , but it has been discontinued by Dr.Plotnikov on 04/06/24. She would like to know why it has been discontinued if she is still in need of it. Patient would like a call back from the nurse regarding this issue because she is very upset as she said it was not discussed with her. Best callback is [phone].

## 2024-05-21 ENCOUNTER — Other Ambulatory Visit: Payer: Self-pay

## 2024-05-21 MED ORDER — KETOCONAZOLE 2 % EX CREA: 1.0000 | TOPICAL_CREAM | Freq: Every day | CUTANEOUS | 1 refills | Status: AC

## 2024-05-21 NOTE — Telephone Encounter (Signed)
Medication has been sent to pts pharmacy. 

## 2024-06-01 DIAGNOSIS — H5203 Hypermetropia, bilateral: Secondary | ICD-10-CM | POA: Diagnosis not present

## 2024-06-01 DIAGNOSIS — H401111 Primary open-angle glaucoma, right eye, mild stage: Secondary | ICD-10-CM | POA: Diagnosis not present

## 2024-06-01 DIAGNOSIS — H52223 Regular astigmatism, bilateral: Secondary | ICD-10-CM | POA: Diagnosis not present

## 2024-06-01 DIAGNOSIS — H35373 Puckering of macula, bilateral: Secondary | ICD-10-CM | POA: Diagnosis not present

## 2024-06-01 DIAGNOSIS — H16223 Keratoconjunctivitis sicca, not specified as Sjogren's, bilateral: Secondary | ICD-10-CM | POA: Diagnosis not present

## 2024-06-01 DIAGNOSIS — H401122 Primary open-angle glaucoma, left eye, moderate stage: Secondary | ICD-10-CM | POA: Diagnosis not present

## 2024-06-01 DIAGNOSIS — H43393 Other vitreous opacities, bilateral: Secondary | ICD-10-CM | POA: Diagnosis not present

## 2024-06-24 DIAGNOSIS — N2581 Secondary hyperparathyroidism of renal origin: Secondary | ICD-10-CM | POA: Diagnosis not present

## 2024-06-24 DIAGNOSIS — I129 Hypertensive chronic kidney disease with stage 1 through stage 4 chronic kidney disease, or unspecified chronic kidney disease: Secondary | ICD-10-CM | POA: Diagnosis not present

## 2024-06-24 DIAGNOSIS — N1832 Chronic kidney disease, stage 3b: Secondary | ICD-10-CM | POA: Diagnosis not present

## 2024-06-24 DIAGNOSIS — D631 Anemia in chronic kidney disease: Secondary | ICD-10-CM | POA: Diagnosis not present

## 2024-06-25 LAB — LAB REPORT - SCANNED
Albumin, Urine POC: <3
Creatinine, POC: 22.6 mg/dL
EGFR: 51
Microalb Creat Ratio: <13

## 2024-06-28 ENCOUNTER — Other Ambulatory Visit: Payer: Self-pay | Admitting: Nephrology

## 2024-06-28 DIAGNOSIS — N1832 Chronic kidney disease, stage 3b: Secondary | ICD-10-CM

## 2024-07-05 ENCOUNTER — Ambulatory Visit
Admission: RE | Admit: 2024-07-05 | Discharge: 2024-07-05 | Disposition: A | Source: Ambulatory Visit | Attending: Nephrology

## 2024-07-05 DIAGNOSIS — N189 Chronic kidney disease, unspecified: Secondary | ICD-10-CM | POA: Diagnosis not present

## 2024-07-05 DIAGNOSIS — N1832 Chronic kidney disease, stage 3b: Secondary | ICD-10-CM

## 2024-07-14 NOTE — Progress Notes (Unsigned)
 [facility] Follow-up:    Date:  07/15/2024   ID:  SLOANE PALMER, DOB 1946-05-22, MRN [id number]  PCP:  Garald Karlynn GAILS, MD  Cardiologist:  None  Nephrologist:  Referring MD: Garald Karlynn GAILS, MD   CC: Hypertension  History of Present Illness:    Michala T Lebeda is a 78 y.o. female with a hx of coronary artery disease, hypertension, hyperthyroidism, GERD, esophageal stricture, anemia, and glaucoma, here for follow-up. She was initially seen 11/07/2021 in the [facility]. She previously had a left heart cath in 2012 with trivial CAD and normal EF. Nuclear stress in 2015 and Echo in 2017 were unremarkable. She was last seen in cardiology by Dr. Jeffrie on 02/28/2017 and was doing well. Her blood pressure was stable on spironolactone . Ms. Genis saw her PCP 10/09/2021 and her BP was 162/90. She reported readings of 160-170 in the AM and 130-146 in the PM at home. She was instructed to continue with candesartan  and spironolactone  in the morning, add 10 mg bystolic  at hs starting with 1/2 a day, and use catapress as needed. She was referred to the Advanced Hypertension clinic.  At her visit 10/2021 blood pressures remained uncontrolled.  She was walking and plan to increase her exercise.  Spironolactone  was increased to 50 mg and HCTZ 12.5 mg was added.  She followed up with our pharmacist 11/2021 and blood pressure was much better controlled at home, averaging in the 120s.  It was in the 150s in the office suggestive of whitecoat hypertension superimposed on essential hypertension.  It has been very well-controlled when she has seen her PCP since that time.  At her visit 07/2023 she was doing well.  BP was running low in the setting of weight loss and she was instructed to hold hydrochlorothiazide .  Discussed the use of AI scribe software for clinical note transcription with the patient, who gave verbal consent to proceed.  History of Present Illness Ms. Mckinlay's  blood pressure readings are 130/85 mmHg in the mornings before taking her medication, and 110-115 mmHg after medication. She is currently taking candesartan , hydrochlorothiazide , and spironolactone . Previously, her blood pressure was higher before starting medication, but it has since improved.  She engages in regular physical activity, aiming to walk 3 to 5 days a week, which she believes has contributed to her improved blood pressure control. Her diet is described as inconsistent, with efforts to cook at home more often than ordering out.  She is trying to reduce animal products and incorporate more plant-based foods.  She occasionally experiences a 'funny' feeling on her side, which she sometimes worries might be heart-related, but it occurs both at rest and during activity. No significant chest pain or pressure, and no significant shortness of breath, although she notes mild shortness of breath when walking.  She reports difficulty sleeping at times, often due to noise disturbances, but she is managing to get 7 to 8 hours of sleep per night. She has not tried any specific sleep aids or apps to help with this issue.  Previous antihypertensives: Amlodipine -edema HCTZ-hypokalemia Metoprolol -fatigue Carvedilol  Clonidine  (rarely used) Nebivolol -did not like how she felt  Past Medical History:  Diagnosis Date   Anemia    Aortic atherosclerosis 11/07/2021   CAD (coronary artery disease)    no cardiologist, followed by PCP only   Cataract    bil cataracts removed   Diverticulosis    Generalized headaches    GERD (gastroesophageal reflux disease)    Glaucoma  Hemorrhoids    Hiatal hernia    On CT done Jan 31, 2011    Hx of colonic polyps    Hypertension    Hyperthyroidism    Osteoarthritis    Uterine polyp    Vitamin D  deficiency     Past Surgical History:  Procedure Laterality Date   ABDOMINAL HYSTERECTOMY N/A 04/26/2013   Procedure: HYSTERECTOMY ABDOMINAL;  Surgeon: Rosaline LITTIE Cobble, MD;  Location: [facility];  Service: Gynecology;  Laterality: N/A;   COLONOSCOPY     DILATION AND CURETTAGE OF UTERUS  01/18/2013   with uterine polypectomy   SALPINGOOPHORECTOMY Bilateral 04/26/2013   Procedure: SALPINGO OOPHORECTOMY;  Surgeon: Rosaline LITTIE Cobble, MD;  Location: [facility];  Service: Gynecology;  Laterality: Bilateral;   UPPER GASTROINTESTINAL ENDOSCOPY      Current Medications: Current Meds  Medication Sig   amitriptyline  (ELAVIL ) 25 MG tablet Take 1 tablet (25 mg total) by mouth at bedtime as needed for sleep. Due for routine OV   brimonidine  (ALPHAGAN ) 0.2 % ophthalmic solution SMARTSIG:1 Drop(s) In Eye(s) Every 12 Hours   candesartan  (ATACAND ) 32 MG tablet Take 1 tablet (32 mg total) by mouth daily.   cholecalciferol (VITAMIN D ) 1000 UNITS tablet Take 1,000 Units by mouth every other day.    Ferrous Sulfate (IRON) 325 (65 FE) MG TABS Take 1 tablet by mouth 3 (three) times a week.  (Patient taking differently: Take 1 tablet by mouth. Taking 4 times a week)   hydrochlorothiazide  (MICROZIDE ) 12.5 MG capsule Take 1 capsule (12.5 mg total) by mouth daily.   hydrocortisone  (PROCTOCORT ) 1 % CREA Apply 1 application topically 2 (two) times daily as needed (dry skin).    ketoconazole  (NIZORAL ) 2 % cream Apply 1 Application topically daily.   pantoprazole  (PROTONIX ) 40 MG tablet Take 1 tablet (40 mg total) by mouth daily.   spironolactone  (ALDACTONE ) 50 MG tablet Take 1 tablet (50 mg total) by mouth daily.   timolol  (TIMOPTIC ) 0.5 % ophthalmic solution 1 drop every morning.   triamcinolone  ointment (KENALOG ) 0.1 % APPLY TO AFFECTED AREA TWICE A DAY     Allergies:   Amlodipine , Hctz [hydrochlorothiazide ], Metoprolol , and Tizanidine   Social History   Socioeconomic History   Marital status: Divorced    Spouse name: n/a   Number of children: 3   Years of education: Not on file   Highest education level: Not on file  Occupational History   Occupation: RETIRED/CNA     Employer: [employer]    Comment: [employer]  Tobacco Use   Smoking status: Never    Passive exposure: Past   Smokeless tobacco: Never  Vaping Use   Vaping status: Never Used  Substance and Sexual Activity   Alcohol use: No    Alcohol/week: 0.0 standard drinks of alcohol   Drug use: No   Sexual activity: Yes  Other Topics Concern   Not on file  Social History Narrative      Lives alone/2025   Social Drivers of Health   Financial Resource Strain: Low Risk  (03/22/2024)   Overall Financial Resource Strain (CARDIA)    Difficulty of Paying Living Expenses: Not very hard  Food Insecurity: No Food Insecurity (03/22/2024)   Hunger Vital Sign    Worried About Running Out of Food in the Last Year: Never true    Ran Out of Food in the Last Year: Never true  Transportation Needs: No Transportation Needs (03/22/2024)   PRAPARE - Transportation    Lack  of Transportation (Medical): No    Lack of Transportation (Non-Medical): No  Physical Activity: Sufficiently Active (03/22/2024)   Exercise Vital Sign    Days of Exercise per Week: 4 days    Minutes of Exercise per Session: 40 min  Stress: No Stress Concern Present (03/22/2024)   [employer] of Occupational Health - Occupational Stress Questionnaire    Feeling of Stress: Not at all  Social Connections: Moderately Integrated (03/22/2024)   Social Connection and Isolation Panel    Frequency of Communication with Friends and Family: More than three times a week    Frequency of Social Gatherings with Friends and Family: Once a week    Attends Religious Services: More than 4 times per year    Active Member of [employer] or Organizations: Yes    Attends Banker Meetings: Never    Marital Status: Divorced     Family History: The patient's family history includes CVA in her brother; Deep vein thrombosis (age of onset: 24) in her son; Diabetes in her brother; Heart failure in her father; Hypertension in her brother,  father, mother, and sister. There is no history of Colon cancer, Esophageal cancer, Stomach cancer, or Rectal cancer.  ROS:   Please see the history of present illness.    (+) Low back pain All other systems reviewed and are negative.  EKGs/Labs/Other Studies Reviewed:    Bilateral Renal Artery Dopplers  11/14/2021: Summary:  Largest Aortic Diameter: 2.1 cm    Renal:  Right: Normal size right kidney. Normal right Resisitive Index.         Normal cortical thickness of right kidney. No evidence of         right renal artery stenosis. RRV flow present.  Left:  Normal size of left kidney. Normal left Resistive Index.         Normal cortical thickness of the left kidney. No evidence of         left renal artery stenosis. LRV flow present.  Mesenteric:  Normal Celiac artery and Superior Mesenteric artery findings.   LLE Venous Doppler 06/14/2021: Summary:  Left:  - No evidence of deep vein thrombosis from the common femoral through the  popliteal veins.  - No evidence of superficial venous thrombosis.  - The deep venous system is not competent.  - The great saphenous vein is not competent.  - The small saphenous vein is competent.   CTA Chest/abdomen/pelvis 06/06/2020: Vascular/Lymphatic: Atherosclerotic calcification is present within the non-aneurysmal abdominal aorta, without hemodynamically significant stenosis. There is a large, partially visualized left great saphenous vein. This appears relatively stable since 2019.   IMPRESSION: 1. No acute thoracic, abdominal or pelvic pathology. Specifically, no pulmonary embolus. 2. Small hiatal hernia. 3. Colonic diverticulosis without CT evidence for diverticulitis.   Aortic Atherosclerosis (ICD10-I70.0).  Exercise Myoview  02/10/2017: Nuclear stress EF: 64%. Normal wall motion Downsloping ST segment depression ST segment depression of 2 mm was noted during stress in the II, III, aVF and V5 leads. This is a low risk perfusion study.  Normal perfusion pattern would suggest false positive exercise treadmill test results. There was no transient ischemic dilatation. Similar findings were noted on prior nuclear stress test 2015.  EKG:  EKG is personally reviewed. 07/02/2023:  Sinus rhythm. Rate 72 bpm. 11/07/2021: Sinus bradycardia. Rate 58 bpm. 1st degree AV block.  Recent Labs: 04/29/2024: ALT 21; BUN 13; Creatinine, Ser 1.21; Hemoglobin 11.7; Platelets 206.0; Potassium 4.1; Sodium 135; TSH 0.76   Recent Lipid Panel  Component Value Date/Time   CHOL 165 04/29/2024 1513   CHOL 156 01/23/2017 1201   TRIG 226.0 (H) 04/29/2024 1513   HDL 38.10 (L) 04/29/2024 1513   HDL 55 01/23/2017 1201   CHOLHDL 4 04/29/2024 1513   VLDL 45.2 (H) 04/29/2024 1513   LDLCALC 81 04/29/2024 1513   LDLCALC 84 01/23/2017 1201    Physical Exam:    VS:  BP 128/78 (BP Location: Left Arm, Patient Position: Sitting, Cuff Size: Large)   Pulse 74   Ht 5' 4 (1.626 m)   Wt 175 lb 3.2 oz (79.5 kg)   SpO2 97%   BMI 30.07 kg/m  , BMI Body mass index is 30.07 kg/m. GENERAL:  Well appearing HEENT: Pupils equal round and reactive, fundi not visualized, oral mucosa unremarkable NECK:  No jugular venous distention, waveform within normal limits, carotid upstroke brisk and symmetric, no bruits, no thyromegaly LUNGS:  Clear to auscultation bilaterally HEART:  RRR.  PMI not displaced or sustained,S1 and S2 within normal limits, no S3, no S4, no clicks, no rubs, no murmurs ABD:  Flat, positive bowel sounds normal in frequency in pitch, no bruits, no rebound, no guarding, no midline pulsatile mass, no hepatomegaly, no splenomegaly EXT:  2 plus pulses throughout, no edema, no cyanosis no clubbing SKIN:  No rashes no nodules NEURO:  Cranial nerves II through XII grossly intact, motor grossly intact throughout PSYCH:  Cognitively intact, oriented to person place and time   ASSESSMENT/PLAN:    Assessment & Plan # Primary hypertension Hypertension  well-controlled with current regimen. Blood pressure stable at 130/85 mmHg pre-medication and 110-115 mmHg post-medication. - Continue candesartan , hydrochlorothiazide , spironolactone . - Encourage regular physical activity. - Follow up with Dr. Cephas.  # Minimal coronary artery disease Previous catheterization showed minimal blockage, calcium score zero. No significant exertional chest pain, low likelihood of disease progression. - Reassured low likelihood of cardiac origin for discomfort.  # Mildly elevated LDL cholesterol LDL at 81 mg/dL, target <29 mg/dL due to coronary artery disease. Dietary habits inconsistent. - Advise dietary modifications to reduce LDL, focus on plant-based foods, reduce animal products, use plant-based butters and olive oil. - She is not interested in medication at this time.    Screening for Secondary Hypertension:     11/07/2021    8:34 AM  Causes  Drugs/Herbals Screened     - Comments limiting sodium intake, rare caffeine, no EtOH, rare NSAIDs  Renovascular HTN Screened     - Comments Check renal artery Dopplers  Sleep Apnea Screened     - Comments snoring but no other symptoms  Thyroid  Disease Screened  Hyperaldosteronism Screened     - Comments No adenomas on CT in 2021  Pheochromocytoma Screened     - Comments Check catecholamines metanephrines  Cushing's Syndrome N/A  Hyperparathyroidism Screened  Coarctation of the Aorta Screened     - Comments BP symmetric  Compliance Screened     - Comments Verified with pharmacy    Relevant Labs/Studies:    Latest Ref Rng & Units 04/29/2024    3:13 PM 01/20/2024   11:04 AM 04/07/2023    9:27 AM  Basic Labs  Sodium 135 - 145 mEq/L 135  138  135   Potassium 3.5 - 5.1 mEq/L 4.1  4.3  4.0   Creatinine 0.40 - 1.20 mg/dL 8.78  8.94  8.99        Latest Ref Rng & Units 04/29/2024    3:13 PM 04/07/2023  9:27 AM  Thyroid    TSH 0.35 - 5.50 uIU/mL 0.76  1.38        Latest Ref Rng & Units 01/23/2017    12:01 PM  Renin/Aldosterone   Aldosterone 0.0 - 30.0 ng/dL 79.5   Renin 9.832 - 4.619 ng/mL/hr <0.167   Aldos/Renin Ratio 0.0 - 30.0 >122.2        Latest Ref Rng & Units 11/13/2021    9:09 AM  Metanephrines/Catecholamines   Epinephrine 0 - 62 pg/mL 39   Norepinephrine 0 - 874 pg/mL 581   Dopamine 0 - 48 pg/mL <30   Metanephrines 0.0 - 88.0 pg/mL 34.4   Normetanephrines  0.0 - 285.2 pg/mL 60.9        Latest Ref Rng & Units 11/02/2015   10:15 AM  Cortisol  Cortisol  ug/dL 6.8       Disposition:    FU with Raia Amico C. Raford, MD, [facility] as needed  Medication Adjustments/Labs and Tests Ordered: Current medicines are reviewed at length with the patient today.  Concerns regarding medicines are outlined above.   No orders of the defined types were placed in this encounter.  No orders of the defined types were placed in this encounter.   Signed, Annabella Raford, MD  07/15/2024 11:04 AM    [facility]

## 2024-07-15 ENCOUNTER — Ambulatory Visit (HOSPITAL_BASED_OUTPATIENT_CLINIC_OR_DEPARTMENT_OTHER): Admitting: Cardiovascular Disease

## 2024-07-15 ENCOUNTER — Encounter (HOSPITAL_BASED_OUTPATIENT_CLINIC_OR_DEPARTMENT_OTHER): Payer: Self-pay | Admitting: Cardiovascular Disease

## 2024-07-15 VITALS — BP 128/78 | HR 74 | Ht 64.0 in | Wt 175.2 lb

## 2024-07-15 DIAGNOSIS — I700 Atherosclerosis of aorta: Secondary | ICD-10-CM

## 2024-07-15 DIAGNOSIS — K649 Unspecified hemorrhoids: Secondary | ICD-10-CM | POA: Diagnosis not present

## 2024-07-15 DIAGNOSIS — I10 Essential (primary) hypertension: Secondary | ICD-10-CM | POA: Diagnosis not present

## 2024-07-15 DIAGNOSIS — I872 Venous insufficiency (chronic) (peripheral): Secondary | ICD-10-CM

## 2024-07-15 NOTE — Patient Instructions (Addendum)
 Medication Instructions:  ?Your physician recommends that you continue on your current medications as directed. Please refer to the Current Medication list given to you today.  ? ?Labwork: ?NONE ? ?Testing/Procedures: ?NONE ? ?Follow-Up: ?AS NEEDED  ? ?  ?

## 2025-03-23 ENCOUNTER — Ambulatory Visit
